# Patient Record
Sex: Female | Born: 1950 | Race: Black or African American | Hispanic: No | Smoking: Former smoker
Health system: Southern US, Community
[De-identification: ages and names within clinical notes are randomized; demographics above are authoritative.]

## PROBLEM LIST (undated history)

## (undated) DIAGNOSIS — R002 Palpitations: Secondary | ICD-10-CM

## (undated) DIAGNOSIS — E079 Disorder of thyroid, unspecified: Secondary | ICD-10-CM

## (undated) DIAGNOSIS — I10 Essential (primary) hypertension: Secondary | ICD-10-CM

## (undated) DIAGNOSIS — I5030 Unspecified diastolic (congestive) heart failure: Secondary | ICD-10-CM

## (undated) DIAGNOSIS — C801 Malignant (primary) neoplasm, unspecified: Secondary | ICD-10-CM

## (undated) DIAGNOSIS — E119 Type 2 diabetes mellitus without complications: Secondary | ICD-10-CM

## (undated) DIAGNOSIS — C73 Malignant neoplasm of thyroid gland: Secondary | ICD-10-CM

## (undated) DIAGNOSIS — H43811 Vitreous degeneration, right eye: Secondary | ICD-10-CM

## (undated) DIAGNOSIS — I2510 Atherosclerotic heart disease of native coronary artery without angina pectoris: Secondary | ICD-10-CM

## (undated) DIAGNOSIS — I700 Atherosclerosis of aorta: Secondary | ICD-10-CM

## (undated) DIAGNOSIS — M5136 Other intervertebral disc degeneration, lumbar region: Secondary | ICD-10-CM

## (undated) DIAGNOSIS — D649 Anemia, unspecified: Secondary | ICD-10-CM

## (undated) DIAGNOSIS — I219 Acute myocardial infarction, unspecified: Secondary | ICD-10-CM

## (undated) DIAGNOSIS — K219 Gastro-esophageal reflux disease without esophagitis: Secondary | ICD-10-CM

## (undated) DIAGNOSIS — H02833 Dermatochalasis of right eye, unspecified eyelid: Secondary | ICD-10-CM

## (undated) DIAGNOSIS — H25813 Combined forms of age-related cataract, bilateral: Secondary | ICD-10-CM

## (undated) DIAGNOSIS — H02836 Dermatochalasis of left eye, unspecified eyelid: Secondary | ICD-10-CM

## (undated) DIAGNOSIS — I517 Cardiomegaly: Secondary | ICD-10-CM

## (undated) DIAGNOSIS — I6381 Other cerebral infarction due to occlusion or stenosis of small artery: Secondary | ICD-10-CM

## (undated) DIAGNOSIS — N2889 Other specified disorders of kidney and ureter: Secondary | ICD-10-CM

## (undated) HISTORY — DX: Other cerebral infarction due to occlusion or stenosis of small artery: I63.81

## (undated) HISTORY — DX: Type 2 diabetes mellitus without complications: E11.9

## (undated) HISTORY — DX: Gastro-esophageal reflux disease without esophagitis: K21.9

## (undated) HISTORY — DX: Dermatochalasis of right eye, unspecified eyelid: H02.833

## (undated) HISTORY — DX: Disorder of thyroid, unspecified: E07.9

## (undated) HISTORY — DX: Combined forms of age-related cataract, bilateral: H25.813

## (undated) HISTORY — DX: Palpitations: R00.2

## (undated) HISTORY — DX: Atherosclerotic heart disease of native coronary artery without angina pectoris: I25.10

## (undated) HISTORY — DX: Acute myocardial infarction, unspecified: I21.9

## (undated) HISTORY — DX: Vitreous degeneration, right eye: H43.811

## (undated) HISTORY — DX: Essential (primary) hypertension: I10

## (undated) HISTORY — DX: Unspecified diastolic (congestive) heart failure: I50.30

## (undated) HISTORY — DX: Dermatochalasis of left eye, unspecified eyelid: H02.836

## (undated) HISTORY — DX: Atherosclerosis of aorta: I70.0

## (undated) HISTORY — DX: Other specified disorders of kidney and ureter: N28.89

## (undated) HISTORY — DX: Malignant (primary) neoplasm, unspecified: C80.1

## (undated) HISTORY — DX: Malignant neoplasm of thyroid gland: C73

## (undated) HISTORY — DX: Other intervertebral disc degeneration, lumbar region: M51.36

## (undated) HISTORY — DX: Cardiomegaly: I51.7

---
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
## 2003-07-27 DIAGNOSIS — I219 Acute myocardial infarction, unspecified: Secondary | ICD-10-CM

## 2003-07-27 HISTORY — DX: Acute myocardial infarction, unspecified: I21.9

## 2011-05-24 DIAGNOSIS — K2100 Gastro-esophageal reflux disease with esophagitis, without bleeding: Secondary | ICD-10-CM | POA: Insufficient documentation

## 2011-05-24 DIAGNOSIS — I2510 Atherosclerotic heart disease of native coronary artery without angina pectoris: Secondary | ICD-10-CM | POA: Insufficient documentation

## 2011-05-24 DIAGNOSIS — I25119 Atherosclerotic heart disease of native coronary artery with unspecified angina pectoris: Secondary | ICD-10-CM | POA: Insufficient documentation

## 2011-05-24 DIAGNOSIS — E785 Hyperlipidemia, unspecified: Secondary | ICD-10-CM | POA: Insufficient documentation

## 2011-05-24 DIAGNOSIS — I252 Old myocardial infarction: Secondary | ICD-10-CM | POA: Insufficient documentation

## 2011-05-24 DIAGNOSIS — M1712 Unilateral primary osteoarthritis, left knee: Secondary | ICD-10-CM | POA: Insufficient documentation

## 2011-08-30 DIAGNOSIS — R928 Other abnormal and inconclusive findings on diagnostic imaging of breast: Secondary | ICD-10-CM | POA: Insufficient documentation

## 2011-09-29 DIAGNOSIS — L858 Other specified epidermal thickening: Secondary | ICD-10-CM | POA: Insufficient documentation

## 2011-11-29 DIAGNOSIS — R002 Palpitations: Secondary | ICD-10-CM | POA: Insufficient documentation

## 2012-06-12 DIAGNOSIS — E6609 Other obesity due to excess calories: Secondary | ICD-10-CM | POA: Insufficient documentation

## 2012-06-12 DIAGNOSIS — Z6830 Body mass index (BMI) 30.0-30.9, adult: Secondary | ICD-10-CM

## 2014-01-21 DIAGNOSIS — R911 Solitary pulmonary nodule: Secondary | ICD-10-CM | POA: Insufficient documentation

## 2014-01-29 DIAGNOSIS — E041 Nontoxic single thyroid nodule: Secondary | ICD-10-CM | POA: Insufficient documentation

## 2014-06-24 DIAGNOSIS — N952 Postmenopausal atrophic vaginitis: Secondary | ICD-10-CM | POA: Insufficient documentation

## 2014-07-26 HISTORY — PX: TOTAL THYROIDECTOMY: SHX2547

## 2014-08-09 DIAGNOSIS — C73 Malignant neoplasm of thyroid gland: Secondary | ICD-10-CM | POA: Insufficient documentation

## 2014-08-17 DIAGNOSIS — C73 Malignant neoplasm of thyroid gland: Secondary | ICD-10-CM | POA: Insufficient documentation

## 2014-09-16 DIAGNOSIS — R21 Rash and other nonspecific skin eruption: Secondary | ICD-10-CM | POA: Insufficient documentation

## 2014-09-16 DIAGNOSIS — I8393 Asymptomatic varicose veins of bilateral lower extremities: Secondary | ICD-10-CM | POA: Insufficient documentation

## 2014-12-30 DIAGNOSIS — Z0000 Encounter for general adult medical examination without abnormal findings: Secondary | ICD-10-CM | POA: Insufficient documentation

## 2015-04-21 DIAGNOSIS — R1031 Right lower quadrant pain: Secondary | ICD-10-CM | POA: Insufficient documentation

## 2015-10-27 DIAGNOSIS — S39012A Strain of muscle, fascia and tendon of lower back, initial encounter: Secondary | ICD-10-CM | POA: Insufficient documentation

## 2015-10-27 DIAGNOSIS — E1142 Type 2 diabetes mellitus with diabetic polyneuropathy: Secondary | ICD-10-CM | POA: Insufficient documentation

## 2015-12-30 DIAGNOSIS — E890 Postprocedural hypothyroidism: Secondary | ICD-10-CM | POA: Insufficient documentation

## 2015-12-30 DIAGNOSIS — N644 Mastodynia: Secondary | ICD-10-CM | POA: Insufficient documentation

## 2015-12-30 DIAGNOSIS — L509 Urticaria, unspecified: Secondary | ICD-10-CM | POA: Insufficient documentation

## 2016-01-28 DIAGNOSIS — I5189 Other ill-defined heart diseases: Secondary | ICD-10-CM | POA: Insufficient documentation

## 2016-02-10 DIAGNOSIS — E785 Hyperlipidemia, unspecified: Secondary | ICD-10-CM

## 2016-02-10 DIAGNOSIS — K219 Gastro-esophageal reflux disease without esophagitis: Secondary | ICD-10-CM | POA: Insufficient documentation

## 2016-02-10 DIAGNOSIS — E1169 Type 2 diabetes mellitus with other specified complication: Secondary | ICD-10-CM | POA: Insufficient documentation

## 2016-02-16 DIAGNOSIS — I10 Essential (primary) hypertension: Secondary | ICD-10-CM | POA: Insufficient documentation

## 2016-07-09 DIAGNOSIS — N281 Cyst of kidney, acquired: Secondary | ICD-10-CM | POA: Insufficient documentation

## 2016-09-17 ENCOUNTER — Ambulatory Visit (INDEPENDENT_AMBULATORY_CARE_PROVIDER_SITE_OTHER): Payer: Medicare Other | Admitting: Physician Assistant

## 2016-09-17 ENCOUNTER — Encounter: Payer: Self-pay | Admitting: Physician Assistant

## 2016-09-17 VITALS — BP 119/72 | HR 75 | Ht 63.0 in | Wt 182.0 lb

## 2016-09-17 DIAGNOSIS — E1142 Type 2 diabetes mellitus with diabetic polyneuropathy: Secondary | ICD-10-CM | POA: Diagnosis not present

## 2016-09-17 MED ORDER — GABAPENTIN 300 MG PO CAPS: 300.0000 mg | ORAL_CAPSULE | Freq: Two times a day (BID) | ORAL | 5 refills | Status: DC

## 2016-09-17 NOTE — Patient Instructions (Addendum)
Increase Gabapentin to 1 tablet twice a day. If you experience drowsiness, you can take both tablets at bedtime.   Follow-up in 3-6 months

## 2016-09-17 NOTE — Progress Notes (Signed)
HPI:                                                                Sara Gardner is a 66 y.o. female who presents to [place]: [facility] today to establish care  Current Concerns include diabetic neuropathy  Patient is transferring her care from [facility]. She is up to date on her diabetic care and labs. Her last A1C was 6.7 (07/09/16)  Patient c/o "burning pain in bilateral lower extremities and hands." She also reports paresthesias and itching in her bilateral hands. She denies recent falls. She denies open sores or wounds on her feet. She is currently taking Gabapentin 300mg  nightly.  Patient states this is impacting her quality of life. She was able to get her Bachelor's degree in business administration, but she is unable to work. She is requesting that I complete a form for her student loan servicer that documents her inability to work full-time.  Health Maintenance Health Maintenance  Topic Date Due  . OPHTHALMOLOGY EXAM  06/17/1960  . COLONOSCOPY  06/17/2000  . PNA vac Low Risk Adult (2 of 2 - PPSV23) 06/25/2015  . HEMOGLOBIN A1C  01/07/2017  . FOOT EXAM  09/17/2017  . MAMMOGRAM  08/06/2018  . TETANUS/TDAP  05/18/2025  . INFLUENZA VACCINE  Completed  . DEXA SCAN  Completed  . Hepatitis C Screening  Completed    Past Medical History:  Diagnosis Date  . Cancer ([place])   . Diabetes mellitus without complication ([place])   . Diastolic heart failure, NYHA class 1 ([place])   . Hypertension   . Myocardial infarction 2005  . Thyroid disease    No past surgical history on file. Social History  Substance Use Topics  . Smoking status: Not on file  . Smokeless tobacco: Not on file  . Alcohol use Not on file   family history is not on file.  ROS: negative except as noted in the HPI  Medications: Current Outpatient Prescriptions  Medication Sig Dispense Refill  . EPINEPHrine 0.3 mg/0.3 mL IJ SOAJ injection Inject 1 each as directed as  needed.    Marland Kitchen lisinopril-hydrochlorothiazide (PRINZIDE,ZESTORETIC) 10-12.5 MG tablet Take 10-12.5 tablets by mouth daily.    Marland Kitchen ammonium lactate (LAC-HYDRIN) 12 % lotion APP TOPICALLY PRF DRY SKIN  0  . aspirin 81 MG chewable tablet Chew by mouth.    . esomeprazole (NEXIUM) 40 MG capsule Take 40 mg by mouth.    . gabapentin (NEURONTIN) 300 MG capsule Take 1 capsule (300 mg total) by mouth 2 (two) times daily. 60 capsule 5  . levothyroxine (SYNTHROID, LEVOTHROID) 100 MCG tablet Take 100 mcg by mouth daily.  1  . metFORMIN (GLUCOPHAGE) 1000 MG tablet Take 1,000 mg by mouth daily.  2  . metoprolol succinate (TOPROL-XL) 50 MG 24 hr tablet Take 50 mg by mouth daily.  5   No current facility-administered medications for this visit.    Allergies  Allergen Reactions  . Bee Venom Swelling  . Tape Rash       Objective:  BP 119/72   Pulse 75   Ht 5\' 3"  (1.6 m)   Wt 182 lb (82.6 kg)   BMI 32.24 kg/m  Gen: well-groomed, cooperative, not ill-appearing, no distress Pulm:  Normal work of breathing, clear to auscultation bilaterally CV: Normal rate, regular rhythm, s1 and s2 distinct, no murmurs, clicks or rubs appreciated on this exam, no carotid bruit, no peripheral edema Neuro: alert and oriented x 3, EOM's intact, PERRLA, DTR's intact MSK: strength 5/5 and symmetric in bilateral upper and lower extreimties, normal gait and station Skin: warm and dry, no rashes or lesions on exposed skin Psych: normal affect, pleasant mood, normal speech and thought content  Diabetic Foot Exam - Simple   Simple Foot Form Diabetic Foot exam was performed with the following findings:  Yes   Visual Inspection No deformities, no ulcerations, no other skin breakdown bilaterally:  Yes Sensation Testing Intact to touch and monofilament testing bilaterally:  Yes See comments:  Yes Pulse Check Posterior Tibialis and Dorsalis pulse intact bilaterally:  Yes Comments Sensation intact to monofilament except for  right heel     I personally reviewed outside records and labs from 07/09/16, including Hemoglobin A1C, TSH, free T4, Hep C, lipid profile, urine Creat and urine microalbumin which were wnl  Assessment and Plan: 66 y.o. female with   1. Diabetic sensorimotor polyneuropathy ([place]) - diabetes well-controlled, A1C <7.0 - distal pulses, gross sensation, and monofilament testing intact. Right heel deficient to monofilament only. Increasing gabapentin to bid - completed student loan documentation stating patient has a condition which prevents her from working full-time - gabapentin (NEURONTIN) 300 MG capsule; Take 1 capsule (300 mg total) by mouth 2 (two) times daily.  Dispense: 60 capsule; Refill: 5  Patient education and anticipatory guidance given Patient agrees with treatment plan Follow-up in 3 months or sooner as needed  Darlyne Russian PA-C

## 2016-09-21 ENCOUNTER — Encounter: Payer: Self-pay | Admitting: Physician Assistant

## 2016-10-04 ENCOUNTER — Other Ambulatory Visit: Payer: Self-pay

## 2016-10-04 ENCOUNTER — Other Ambulatory Visit: Payer: Self-pay | Admitting: Physician Assistant

## 2016-10-04 DIAGNOSIS — E1169 Type 2 diabetes mellitus with other specified complication: Secondary | ICD-10-CM

## 2016-10-04 DIAGNOSIS — E785 Hyperlipidemia, unspecified: Principal | ICD-10-CM

## 2016-10-04 MED ORDER — GLUCOSE BLOOD VI STRP: ORAL_STRIP | 12 refills | Status: DC

## 2016-10-04 MED ORDER — ONETOUCH ULTRA SYSTEM W/DEVICE KIT: PACK | 0 refills | Status: DC

## 2016-10-04 MED ORDER — ONETOUCH ULTRASOFT LANCETS MISC: 99 refills | Status: DC

## 2016-10-06 DIAGNOSIS — M79601 Pain in right arm: Secondary | ICD-10-CM | POA: Diagnosis not present

## 2016-10-06 DIAGNOSIS — Z79899 Other long term (current) drug therapy: Secondary | ICD-10-CM | POA: Diagnosis not present

## 2016-10-06 DIAGNOSIS — I110 Hypertensive heart disease with heart failure: Secondary | ICD-10-CM | POA: Diagnosis not present

## 2016-10-06 DIAGNOSIS — K219 Gastro-esophageal reflux disease without esophagitis: Secondary | ICD-10-CM | POA: Diagnosis not present

## 2016-10-06 DIAGNOSIS — I509 Heart failure, unspecified: Secondary | ICD-10-CM | POA: Diagnosis not present

## 2016-10-06 DIAGNOSIS — R0789 Other chest pain: Secondary | ICD-10-CM | POA: Diagnosis not present

## 2016-10-06 DIAGNOSIS — Z91048 Other nonmedicinal substance allergy status: Secondary | ICD-10-CM | POA: Diagnosis not present

## 2016-10-06 DIAGNOSIS — Z85850 Personal history of malignant neoplasm of thyroid: Secondary | ICD-10-CM | POA: Diagnosis not present

## 2016-10-06 DIAGNOSIS — Z791 Long term (current) use of non-steroidal anti-inflammatories (NSAID): Secondary | ICD-10-CM | POA: Diagnosis not present

## 2016-10-06 DIAGNOSIS — E890 Postprocedural hypothyroidism: Secondary | ICD-10-CM | POA: Diagnosis not present

## 2016-10-06 DIAGNOSIS — I2510 Atherosclerotic heart disease of native coronary artery without angina pectoris: Secondary | ICD-10-CM | POA: Diagnosis not present

## 2016-10-06 DIAGNOSIS — I252 Old myocardial infarction: Secondary | ICD-10-CM | POA: Diagnosis not present

## 2016-10-06 DIAGNOSIS — R918 Other nonspecific abnormal finding of lung field: Secondary | ICD-10-CM | POA: Diagnosis not present

## 2016-10-06 DIAGNOSIS — Z87891 Personal history of nicotine dependence: Secondary | ICD-10-CM | POA: Diagnosis not present

## 2016-10-06 DIAGNOSIS — Z91030 Bee allergy status: Secondary | ICD-10-CM | POA: Diagnosis not present

## 2016-10-06 DIAGNOSIS — E1140 Type 2 diabetes mellitus with diabetic neuropathy, unspecified: Secondary | ICD-10-CM | POA: Diagnosis not present

## 2016-10-06 DIAGNOSIS — Z7982 Long term (current) use of aspirin: Secondary | ICD-10-CM | POA: Diagnosis not present

## 2016-10-06 DIAGNOSIS — M19012 Primary osteoarthritis, left shoulder: Secondary | ICD-10-CM | POA: Diagnosis not present

## 2016-10-06 DIAGNOSIS — E785 Hyperlipidemia, unspecified: Secondary | ICD-10-CM | POA: Diagnosis not present

## 2016-10-06 DIAGNOSIS — E11618 Type 2 diabetes mellitus with other diabetic arthropathy: Secondary | ICD-10-CM | POA: Diagnosis not present

## 2016-10-06 DIAGNOSIS — Z7984 Long term (current) use of oral hypoglycemic drugs: Secondary | ICD-10-CM | POA: Diagnosis not present

## 2016-10-12 DIAGNOSIS — E890 Postprocedural hypothyroidism: Secondary | ICD-10-CM | POA: Diagnosis not present

## 2016-10-12 DIAGNOSIS — I10 Essential (primary) hypertension: Secondary | ICD-10-CM | POA: Diagnosis not present

## 2016-10-12 DIAGNOSIS — I2510 Atherosclerotic heart disease of native coronary artery without angina pectoris: Secondary | ICD-10-CM | POA: Diagnosis not present

## 2016-10-12 DIAGNOSIS — Z79899 Other long term (current) drug therapy: Secondary | ICD-10-CM | POA: Diagnosis not present

## 2016-10-12 DIAGNOSIS — I252 Old myocardial infarction: Secondary | ICD-10-CM | POA: Diagnosis not present

## 2016-10-12 DIAGNOSIS — R0789 Other chest pain: Secondary | ICD-10-CM | POA: Diagnosis not present

## 2016-10-12 DIAGNOSIS — C73 Malignant neoplasm of thyroid gland: Secondary | ICD-10-CM | POA: Diagnosis not present

## 2016-10-28 DIAGNOSIS — B351 Tinea unguium: Secondary | ICD-10-CM | POA: Diagnosis not present

## 2016-10-28 DIAGNOSIS — L602 Onychogryphosis: Secondary | ICD-10-CM | POA: Diagnosis not present

## 2016-10-28 DIAGNOSIS — I2510 Atherosclerotic heart disease of native coronary artery without angina pectoris: Secondary | ICD-10-CM | POA: Diagnosis not present

## 2016-10-28 DIAGNOSIS — E1142 Type 2 diabetes mellitus with diabetic polyneuropathy: Secondary | ICD-10-CM | POA: Diagnosis not present

## 2016-10-28 DIAGNOSIS — I10 Essential (primary) hypertension: Secondary | ICD-10-CM | POA: Diagnosis not present

## 2016-10-29 ENCOUNTER — Other Ambulatory Visit: Payer: Self-pay | Admitting: Physician Assistant

## 2016-11-01 ENCOUNTER — Other Ambulatory Visit: Payer: Self-pay | Admitting: Physician Assistant

## 2016-11-01 DIAGNOSIS — E1169 Type 2 diabetes mellitus with other specified complication: Secondary | ICD-10-CM

## 2016-11-01 DIAGNOSIS — E785 Hyperlipidemia, unspecified: Principal | ICD-10-CM

## 2016-11-15 ENCOUNTER — Other Ambulatory Visit: Payer: Self-pay

## 2016-11-15 DIAGNOSIS — E785 Hyperlipidemia, unspecified: Principal | ICD-10-CM

## 2016-11-15 DIAGNOSIS — E1169 Type 2 diabetes mellitus with other specified complication: Secondary | ICD-10-CM

## 2016-11-15 MED ORDER — ONETOUCH DELICA LANCING DEV MISC: 99 refills | Status: DC

## 2016-12-15 ENCOUNTER — Ambulatory Visit (INDEPENDENT_AMBULATORY_CARE_PROVIDER_SITE_OTHER): Payer: Medicare Other | Admitting: Physician Assistant

## 2016-12-15 ENCOUNTER — Ambulatory Visit: Payer: Medicare Other | Admitting: Physician Assistant

## 2016-12-15 ENCOUNTER — Encounter: Payer: Self-pay | Admitting: Physician Assistant

## 2016-12-15 VITALS — BP 102/68 | HR 89 | Wt 182.0 lb

## 2016-12-15 DIAGNOSIS — R600 Localized edema: Secondary | ICD-10-CM | POA: Insufficient documentation

## 2016-12-15 DIAGNOSIS — M170 Bilateral primary osteoarthritis of knee: Secondary | ICD-10-CM | POA: Diagnosis not present

## 2016-12-15 DIAGNOSIS — Z91030 Bee allergy status: Secondary | ICD-10-CM | POA: Insufficient documentation

## 2016-12-15 DIAGNOSIS — K219 Gastro-esophageal reflux disease without esophagitis: Secondary | ICD-10-CM | POA: Diagnosis not present

## 2016-12-15 DIAGNOSIS — Z0279 Encounter for issue of other medical certificate: Secondary | ICD-10-CM | POA: Diagnosis not present

## 2016-12-15 DIAGNOSIS — E1142 Type 2 diabetes mellitus with diabetic polyneuropathy: Secondary | ICD-10-CM | POA: Diagnosis not present

## 2016-12-15 MED ORDER — AMBULATORY NON FORMULARY MEDICATION: 0 refills | Status: AC

## 2016-12-15 MED ORDER — ESOMEPRAZOLE MAGNESIUM 20 MG PO CPDR: 20.0000 mg | DELAYED_RELEASE_CAPSULE | Freq: Every day | ORAL | 1 refills | Status: DC

## 2016-12-15 MED ORDER — EPINEPHRINE 0.3 MG/0.3ML IJ SOAJ: INTRAMUSCULAR | 1 refills | Status: DC

## 2016-12-15 NOTE — Patient Instructions (Addendum)
-   Go downstairs for labs - Wear compression stockings and elevate legs when possible - Follow-up in 3 months for diabetes or sooner as needed   Peripheral Edema Peripheral edema is swelling that is caused by a buildup of fluid. Peripheral edema most often affects the lower legs, ankles, and feet. It can also develop in the arms, hands, and face. The area of the body that has peripheral edema will look swollen. It may also feel heavy or warm. Your clothes may start to feel tight. Pressing on the area may make a temporary dent in your skin. You may not be able to move your arm or leg as much as usual. There are many causes of peripheral edema. It can be a complication of other diseases, such as congestive heart failure, kidney disease, or a problem with your blood circulation. It also can be a side effect of certain medicines. It often happens to women during pregnancy. Sometimes, the cause is not known. Treating the underlying condition is often the only treatment for peripheral edema. Follow these instructions at home: Pay attention to any changes in your symptoms. Take these actions to help with your discomfort:  Raise (elevate) your legs while you are sitting or lying down.  Move around often to prevent stiffness and to lessen swelling. Do not sit or stand for long periods of time.  Wear support stockings as told by your health care provider.  Follow instructions from your health care provider about limiting salt (sodium) in your diet. Sometimes eating less salt can reduce swelling.  Take over-the-counter and prescription medicines only as told by your health care provider. Your health care provider may prescribe medicine to help your body get rid of excess water (diuretic).  Keep all follow-up visits as told by your health care provider. This is important. Contact a health care provider if:  You have a fever.  Your edema starts suddenly or is getting worse, especially if you are pregnant  or have a medical condition.  You have swelling in only one leg.  You have increased swelling and pain in your legs. Get help right away if:  You develop shortness of breath, especially when you are lying down.  You have pain in your chest or abdomen.  You feel weak.  You faint. This information is not intended to replace advice given to you by your health care provider. Make sure you discuss any questions you have with your health care provider. Document Released: 08/19/2004 Document Revised: 12/15/2015 Document Reviewed: 01/22/2015 Elsevier Interactive Patient Education  2017 [employer].

## 2016-12-15 NOTE — Progress Notes (Deleted)
   Subjective:    I'm seeing this patient as a consultation for:    CC:   HPI:  Past medical history:  Negative.  See flowsheet/record as well for more information.  Surgical history: Negative.  See flowsheet/record as well for more information.  Family history: Negative.  See flowsheet/record as well for more information.  Social history: Negative.  See flowsheet/record as well for more information.  Allergies, and medications have been entered into the medical record, reviewed, and no changes needed.   Review of Systems: No headache, visual changes, nausea, vomiting, diarrhea, constipation, dizziness, abdominal pain, skin rash, fevers, chills, night sweats, weight loss, swollen lymph nodes, body aches, joint swelling, muscle aches, chest pain, shortness of breath, mood changes, visual or auditory hallucinations.   Objective:   General: Well Developed, well nourished, and in no acute distress.  Neuro/Psych: Alert and oriented x3, extra-ocular muscles intact, able to move all 4 extremities, sensation grossly intact. Skin: Warm and dry, no rashes noted.  Respiratory: Not using accessory muscles, speaking in full sentences, trachea midline.  Cardiovascular: Pulses palpable, no extremity edema. Abdomen: Does not appear distended.   Impression and Recommendations:   This case required medical decision making of moderate complexity.  No problem-specific Assessment & Plan notes found for this encounter.

## 2016-12-15 NOTE — Progress Notes (Signed)
HPI:                                                                Sara Gardner is a 66 y.o. female who presents to [place]: [facility] today for bilateral lower extremity swelling  Patient with PMH HTN, DM II with peripheral neuropathy, hypothyroid, CAD, and HFpEF reports bilateral lower extremity edema x 1 week. She states her feet are so swollen her shoes do not fit. She states "I have never been like this." She does endorse bilateral knee arthritis and endorses knee pain today. She denies orthopnea, DOE, PND. She recently was evicted from her apartment and has been on her feet more than her baseline moving her belongings into storage. She has unfortunately been living and sleeping out of her car for the past week.  She is requesting a disability parking placard today.  GERD: requesting refill of Nexium daily. No history of PUD or GI bleed. Denies melena or hematochezia.   She is also requesting a refill of her Epi-pen for bee venom allergy  Past Medical History:  Diagnosis Date  . Cancer ([place])   . Diabetes mellitus without complication ([place])   . Diastolic heart failure, NYHA class 1 ([place])   . GERD (gastroesophageal reflux disease)   . Hypertension   . Myocardial infarction ([place]) 2005  . Papillary thyroid carcinoma ([place])   . Thyroid disease    No past surgical history on file. Social History  Substance Use Topics  . Smoking status: Never Smoker  . Smokeless tobacco: Never Used  . Alcohol use No   family history is not on file.  ROS: negative except as noted in the HPI  Medications: Current Outpatient Prescriptions  Medication Sig Dispense Refill  . ammonium lactate (LAC-HYDRIN) 12 % lotion APP TOPICALLY PRF DRY SKIN  0  . aspirin 81 MG chewable tablet Chew by mouth.    . Blood Glucose Monitoring Suppl (ONE TOUCH ULTRA MINI) w/Device KIT CHECK FASTING BLOOD SUGAR DAILY AND 2 HOURS AFTER LARGEST MEAL OF THE DAY 100 each 1  .  EPINEPHrine 0.3 mg/0.3 mL IJ SOAJ injection Inject 1 each as directed as needed. 1 Device 1  . esomeprazole (NEXIUM) 20 MG capsule Take 1 capsule (20 mg total) by mouth daily. 90 capsule 1  . Lancet Devices (ONE TOUCH DELICA LANCING DEV) MISC Check fasting blood sugar daily and 2 hours after largest meal of the day. Dx DM. 100 each prn  . Lancets (ONETOUCH ULTRASOFT) lancets Check fasting blood sugar daily and 2 hours after largest meal of the day. Dx DM. 100 each prn  . levothyroxine (SYNTHROID, LEVOTHROID) 100 MCG tablet Take 100 mcg by mouth daily.  1  . lisinopril-hydrochlorothiazide (PRINZIDE,ZESTORETIC) 10-12.5 MG tablet Take 10-12.5 tablets by mouth daily.    . metFORMIN (GLUCOPHAGE) 1000 MG tablet Take 1,000 mg by mouth daily.  2  . metoprolol succinate (TOPROL-XL) 50 MG 24 hr tablet Take 50 mg by mouth daily.  5  . ONE TOUCH ULTRA TEST test strip CHECK BLOOD SUGARS EVERY DAY AND 2 HOURS AFTER THE LARGEST MEAL OF THE DAY 100 each 2  . AMBULATORY NON FORMULARY MEDICATION Knee-high, medium compression, graduated compression stockings. Apply to lower extremities. [email or web address], Zippered Compression  Stockings, medium circ, long length 1 each 0   No current facility-administered medications for this visit.    Allergies  Allergen Reactions  . Bee Venom Swelling  . Tape Rash       Objective:  BP 102/68   Pulse 89   Wt 182 lb (82.6 kg)   BMI 32.24 kg/m  Gen: well-groomed, cooperative, not ill-appearing, no distress Pulm: Normal work of breathing, normal phonation, clear to auscultation bilaterally, no wheezes, rales or rhonchi CV: Normal rate, regular rhythm, s1 and s2 distinct, no murmurs, clicks or rubs  Neuro: alert and oriented x 3, EOM's intact, no tremor MSK: bilateral, symmetric nonpitting pedal edema, calves nontender, brisk capillary refill, knees visibly swollen right worse than left, no effusion, ROM intact; normal gait and station Skin: warm, dry, intact; no  rashes or lesions on exposed skin, no foot ulcers   No results found for this or any previous visit (from the past 72 hour(s)). No results found.    Assessment and Plan: 66 y.o. female with   1. Bilateral lower extremity edema - suspect this is most likely dependent edema given history and that patient is unable to lie recumbent at night while sleeping in a car - checking BNP, BMP, TSH - compression stockings - elevate lower extremities   2. Bilateral primary osteoarthritis of knee - tylenol 670m prn  3. Allergy to honey bee venom - EPINEPHrine 0.3 mg/0.3 mL IJ SOAJ injection;   Dispense: 1 Device  4. Gastroesophageal reflux disease without esophagitis - esomeprazole (NEXIUM) 40 MG capsule; Take 1 capsule (40 mg total) by mouth.  5. Medical certificate issuance - disability parking placard application completed and signed  6. Controlled Type II DM - HgbA1C - cont daily meds  Patient education and anticipatory guidance given Patient agrees with treatment plan Follow-up in 3 months for DM or sooner as needed if symptoms worsen or fail to improve  CDarlyne RussianPA-C

## 2016-12-16 LAB — TSH: TSH: 2.03 mIU/L

## 2016-12-16 LAB — BASIC METABOLIC PANEL
BUN: 11 mg/dL (ref 7–25)
CALCIUM: 9.4 mg/dL (ref 8.6–10.4)
CHLORIDE: 105 mmol/L (ref 98–110)
CO2: 29 mmol/L (ref 20–31)
CREATININE: 0.83 mg/dL (ref 0.50–0.99)
Glucose, Bld: 109 mg/dL — ABNORMAL HIGH (ref 65–99)
Potassium: 4.5 mmol/L (ref 3.5–5.3)
Sodium: 144 mmol/L (ref 135–146)

## 2016-12-16 LAB — HEMOGLOBIN A1C
Hgb A1c MFr Bld: 7.2 % — ABNORMAL HIGH (ref <5.7)
Mean Plasma Glucose: 160 mg/dL

## 2016-12-16 LAB — BRAIN NATRIURETIC PEPTIDE: BRAIN NATRIURETIC PEPTIDE: 14.9 pg/mL (ref <100)

## 2016-12-27 ENCOUNTER — Encounter: Payer: Self-pay | Admitting: Family Medicine

## 2016-12-27 ENCOUNTER — Ambulatory Visit (INDEPENDENT_AMBULATORY_CARE_PROVIDER_SITE_OTHER): Payer: Medicare Other | Admitting: Family Medicine

## 2016-12-27 VITALS — BP 120/74 | HR 80 | Wt 182.0 lb

## 2016-12-27 DIAGNOSIS — E1142 Type 2 diabetes mellitus with diabetic polyneuropathy: Secondary | ICD-10-CM

## 2016-12-27 NOTE — Patient Instructions (Signed)
Thank you for coming in today. Return as needed to see me.  Follow up with Evlyn Clines

## 2016-12-28 NOTE — Progress Notes (Signed)
Sara Gardner is a 66 y.o. female who presents to [place]: [facility] today for evaluation for neuropathy pain and disability. Patient has a long history of disabling polyneuropathy. This produces pierced using pain. She's been unable to work for years due to this condition. She has paperwork today for disability that she would like filled out.   Past Medical History:  Diagnosis Date  . Cancer ([place])   . Diabetes mellitus without complication ([place])   . Diastolic heart failure, NYHA class 1 ([place])   . GERD (gastroesophageal reflux disease)   . Hypertension   . Myocardial infarction ([place]) 2005  . Papillary thyroid carcinoma ([place])   . Thyroid disease    No past surgical history on file. Social History  Substance Use Topics  . Smoking status: Never Smoker  . Smokeless tobacco: Never Used  . Alcohol use No   family history is not on file.  ROS as above:  Medications: Current Outpatient Prescriptions  Medication Sig Dispense Refill  . AMBULATORY NON FORMULARY MEDICATION Knee-high, medium compression, graduated compression stockings. Apply to lower extremities. [email or web address], Zippered Compression Stockings, medium circ, long length 1 each 0  . ammonium lactate (LAC-HYDRIN) 12 % lotion APP TOPICALLY PRF DRY SKIN  0  . aspirin 81 MG chewable tablet Chew by mouth.    . Blood Glucose Monitoring Suppl (ONE TOUCH ULTRA MINI) w/Device KIT CHECK FASTING BLOOD SUGAR DAILY AND 2 HOURS AFTER LARGEST MEAL OF THE DAY 100 each 1  . EPINEPHrine 0.3 mg/0.3 mL IJ SOAJ injection Inject 1 each as directed as needed. 1 Device 1  . esomeprazole (NEXIUM) 20 MG capsule Take 1 capsule (20 mg total) by mouth daily. 90 capsule 1  . Lancet Devices (ONE TOUCH DELICA LANCING DEV) MISC Check fasting blood sugar daily and 2 hours after largest meal of the day. Dx DM. 100 each prn  . Lancets  (ONETOUCH ULTRASOFT) lancets Check fasting blood sugar daily and 2 hours after largest meal of the day. Dx DM. 100 each prn  . levothyroxine (SYNTHROID, LEVOTHROID) 100 MCG tablet Take 100 mcg by mouth daily.  1  . lisinopril-hydrochlorothiazide (PRINZIDE,ZESTORETIC) 10-12.5 MG tablet Take 10-12.5 tablets by mouth daily.    . metFORMIN (GLUCOPHAGE) 1000 MG tablet Take 1,000 mg by mouth daily.  2  . metoprolol succinate (TOPROL-XL) 50 MG 24 hr tablet Take 50 mg by mouth daily.  5  . ONE TOUCH ULTRA TEST test strip CHECK BLOOD SUGARS EVERY DAY AND 2 HOURS AFTER THE LARGEST MEAL OF THE DAY 100 each 2   No current facility-administered medications for this visit.    Allergies  Allergen Reactions  . Bee Venom Swelling  . Tape Rash    Health Maintenance Health Maintenance  Topic Date Due  . PNA vac Low Risk Adult (2 of 2 - PPSV23) 06/25/2015  . OPHTHALMOLOGY EXAM  03/17/2017 (Originally 06/17/1961)  . COLONOSCOPY  12/15/2017 (Originally 06/17/2000)  . INFLUENZA VACCINE  02/23/2017  . HEMOGLOBIN A1C  06/17/2017  . FOOT EXAM  09/17/2017  . MAMMOGRAM  08/06/2018  . TETANUS/TDAP  05/18/2025  . DEXA SCAN  Completed  . Hepatitis C Screening  Completed     Exam:  BP 120/74   Pulse 80   Wt 182 lb (82.6 kg)   BMI 32.24 kg/m  Gen: Well NAD HEENT: EOMI,  MMM Lungs: Normal work of breathing. CTABL Heart: RRR no MRG Abd: NABS, Soft. Nondistended, Nontender Exts:  Brisk capillary refill, warm and well perfused.  Skin: Decreased sensation bilateral lower extremities.   No results found for this or any previous visit (from the past 72 hour(s)). No results found.    Assessment and Plan: 66 y.o. female with peripheral neuropathy maximal medical improvement at this time with disabling. Plan for conservative management. Paperwork filled out. Recheck as needed with me. Follow-up with PCP as needed.   No orders of the defined types were placed in this encounter.  No orders of the defined  types were placed in this encounter.    Discussed warning signs or symptoms. Please see discharge instructions. Patient expresses understanding.

## 2016-12-29 ENCOUNTER — Other Ambulatory Visit: Payer: Self-pay | Admitting: Physician Assistant

## 2017-01-10 ENCOUNTER — Ambulatory Visit (INDEPENDENT_AMBULATORY_CARE_PROVIDER_SITE_OTHER): Payer: Medicare Other | Admitting: Physician Assistant

## 2017-01-10 VITALS — BP 122/79 | HR 75 | Temp 98.0°F | Wt 178.0 lb

## 2017-01-10 DIAGNOSIS — Z23 Encounter for immunization: Secondary | ICD-10-CM | POA: Diagnosis not present

## 2017-01-10 DIAGNOSIS — L239 Allergic contact dermatitis, unspecified cause: Secondary | ICD-10-CM

## 2017-01-10 MED ORDER — TRIAMCINOLONE ACETONIDE 0.1 % EX CREA: 1.0000 "application " | TOPICAL_CREAM | Freq: Two times a day (BID) | CUTANEOUS | 0 refills | Status: DC

## 2017-01-10 NOTE — Patient Instructions (Signed)
- Apply cream to affected area only, twice a day, for no more than 2 weeks.  - AVOID contact with face and eyes - Benadryl 1 tab nightly as needed for itching   Contact Dermatitis Dermatitis is redness, soreness, and swelling (inflammation) of the skin. Contact dermatitis is a reaction to certain substances that touch the skin. There are two types of contact dermatitis:  Irritant contact dermatitis. This type is caused by something that irritates your skin, such as dry hands from washing them too much. This type does not require previous exposure to the substance for a reaction to occur. This type is more common.  Allergic contact dermatitis. This type is caused by a substance that you are allergic to, such as a nickel allergy or poison ivy. This type only occurs if you have been exposed to the substance (allergen) before. Upon a repeat exposure, your body reacts to the substance. This type is less common.  What are the causes? Many different substances can cause contact dermatitis. Irritant contact dermatitis is most commonly caused by exposure to:  Makeup.  Soaps.  Detergents.  Bleaches.  Acids.  Metal salts, such as nickel.  Allergic contact dermatitis is most commonly caused by exposure to:  Poisonous plants.  Chemicals.  Jewelry.  Latex.  Medicines.  Preservatives in products, such as clothing.  What increases the risk? This condition is more likely to develop in:  People who have jobs that expose them to irritants or allergens.  People who have certain medical conditions, such as asthma or eczema.  What are the signs or symptoms? Symptoms of this condition may occur anywhere on your body where the irritant has touched you or is touched by you. Symptoms include:  Dryness or flaking.  Redness.  Cracks.  Itching.  Pain or a burning feeling.  Blisters.  Drainage of small amounts of blood or clear fluid from skin cracks.  With allergic contact  dermatitis, there may also be swelling in areas such as the eyelids, mouth, or genitals. How is this diagnosed? This condition is diagnosed with a medical history and physical exam. A patch skin test may be performed to help determine the cause. If the condition is related to your job, you may need to see an occupational medicine specialist. How is this treated? Treatment for this condition includes figuring out what caused the reaction and protecting your skin from further contact. Treatment may also include:  Steroid creams or ointments. Oral steroid medicines may be needed in more severe cases.  Antibiotics or antibacterial ointments, if a skin infection is present.  Antihistamine lotion or an antihistamine taken by mouth to ease itching.  A bandage (dressing).  Follow these instructions at home: [place] your skin as needed.  Apply cool compresses to the affected areas.  Try taking a bath with: ? Epsom salts. Follow the instructions on the packaging. You can get these at your local pharmacy or grocery store. ? Baking soda. Pour a small amount into the bath as directed by your health care provider. ? Colloidal oatmeal. Follow the instructions on the packaging. You can get this at your local pharmacy or grocery store.  Try applying baking soda paste to your skin. Stir water into baking soda until it reaches a paste-like consistency.  Do not scratch your skin.  Bathe less frequently, such as every other day.  Bathe in lukewarm water. Avoid using hot water. Medicines  Take or apply over-the-counter and prescription medicines only as told  by your health care provider.  If you were prescribed an antibiotic medicine, take or apply your antibiotic as told by your health care provider. Do not stop using the antibiotic even if your condition starts to improve. General instructions  Keep all follow-up visits as told by your health care provider. This is  important.  Avoid the substance that caused your reaction. If you do not know what caused it, keep a journal to try to track what caused it. Write down: ? What you eat. ? What cosmetic products you use. ? What you drink. ? What you wear in the affected area. This includes jewelry.  If you were given a dressing, take care of it as told by your health care provider. This includes when to change and remove it. Contact a health care provider if:  Your condition does not improve with treatment.  Your condition gets worse.  You have signs of infection such as swelling, tenderness, redness, soreness, or warmth in the affected area.  You have a fever.  You have new symptoms. Get help right away if:  You have a severe headache, neck pain, or neck stiffness.  You vomit.  You feel very sleepy.  You notice red streaks coming from the affected area.  Your bone or joint underneath the affected area becomes painful after the skin has healed.  The affected area turns darker.  You have difficulty breathing. This information is not intended to replace advice given to you by your health care provider. Make sure you discuss any questions you have with your health care provider. Document Released: 07/09/2000 Document Revised: 12/18/2015 Document Reviewed: 11/27/2014 Elsevier Interactive Patient Education  2018 [employer].

## 2017-01-10 NOTE — Progress Notes (Signed)
HPI:                                                                Sara Gardner is a 66 y.o. female who presents to [facility]: [facility] today for arm rash  Onset: yesterday Location: started on left arm, has spread to left leg Duration: constant Character: pruritic, red nodules/papules Aggravating factors / Triggers: none Evolution: same Treatments tried: alcohol  Recent illness / systemic symptoms: none  Medication / drug exposure: none Recent travel: none Animal/insect exposure: none Sexual contacts: none History of allergies: yes, allergic to bee venom, history of urticaria Exposure to new soaps, perfumes, cleaning products: none Exposure to chemicals: none Patient reports she was visiting her father on Sunday and rested her left forearm on the arm rest of the sofa  Past Medical History:  Diagnosis Date  . Cancer ([place])   . Diabetes mellitus without complication ([place])   . Diastolic heart failure, NYHA class 1 ([place])   . GERD (gastroesophageal reflux disease)   . Hypertension   . Myocardial infarction ([place]) 2005  . Papillary thyroid carcinoma ([place])   . Thyroid disease    No past surgical history on file. Social History  Substance Use Topics  . Smoking status: Never Smoker  . Smokeless tobacco: Never Used  . Alcohol use No   family history is not on file.  ROS: denies facial/lip/tongue swelling, dysphagia, sore throat, wheezing, shortness of breath  Medications: Current Outpatient Prescriptions  Medication Sig Dispense Refill  . AMBULATORY NON FORMULARY MEDICATION Knee-high, medium compression, graduated compression stockings. Apply to lower extremities. [email or web address], Zippered Compression Stockings, medium circ, long length 1 each 0  . ammonium lactate (LAC-HYDRIN) 12 % lotion APP TOPICALLY PRF DRY SKIN  0  . aspirin 81 MG chewable tablet Chew by mouth.    . Blood Glucose Monitoring Suppl (ONE TOUCH ULTRA  MINI) w/Device KIT CHECK FASTING BLOOD SUGAR DAILY AND 2 HOURS AFTER LARGEST MEAL OF THE DAY 100 each 1  . EPINEPHrine 0.3 mg/0.3 mL IJ SOAJ injection Inject 1 each as directed as needed. 1 Device 1  . esomeprazole (NEXIUM) 20 MG capsule Take 1 capsule (20 mg total) by mouth daily. 90 capsule 1  . Lancet Devices (ONE TOUCH DELICA LANCING DEV) MISC Check fasting blood sugar daily and 2 hours after largest meal of the day. Dx DM. 100 each prn  . Lancets (ONETOUCH ULTRASOFT) lancets Check fasting blood sugar daily and 2 hours after largest meal of the day. Dx DM. 100 each prn  . levothyroxine (SYNTHROID, LEVOTHROID) 100 MCG tablet Take 100 mcg by mouth daily.  1  . lisinopril-hydrochlorothiazide (PRINZIDE,ZESTORETIC) 10-12.5 MG tablet Take 10-12.5 tablets by mouth daily.    . metFORMIN (GLUCOPHAGE) 1000 MG tablet TAKE 1 TABLET BY MOUTH EVERY MORNING 90 tablet 0  . metoprolol succinate (TOPROL-XL) 50 MG 24 hr tablet TAKE 1 TABLET BY MOUTH EVERY DAY FOR HIGH BLOOD PRESSURE 90 tablet 0  . ONE TOUCH ULTRA TEST test strip CHECK BLOOD SUGARS EVERY DAY AND 2 HOURS AFTER THE LARGEST MEAL OF THE DAY 100 each 2  . triamcinolone cream (KENALOG) 0.1 % Apply 1 application topically 2 (two) times daily. 30 g 0   No current facility-administered medications  for this visit.    Allergies  Allergen Reactions  . Bee Venom Swelling  . Tape Rash       Objective:  BP 122/79   Pulse 75   Temp 98 F (36.7 C) (Oral)   Wt 178 lb (80.7 kg)   BMI 31.53 kg/m  Gen: well-groomed, cooperative, not ill-appearing, no distress HEENT: no facial swelling, normal conjunctiva, oropharynx clear, uvula midline, moist mucus membranes, no frontal or maxillary sinus tenderness, neck supple, trachea midline Pulm: Normal work of breathing, normal phonation, clear to auscultation bilaterally, no wheezes, rales or rhonchi CV: Normal rate, regular rhythm, s1 and s2 distinct, no murmurs, clicks or rubs  Neuro: alert and oriented x 3,  EOM's intact, no tremor MSK: moving all extremities, normal gait and station, no peripheral edema Lymph: no cervical or tonsillar adenopathy Skin: warm, dry, intact; extensor aspect of left forearm with 3 erythematous nodules   No results found for this or any previous visit (from the past 72 hour(s)). No results found.    Assessment and Plan: 66 y.o. female with   1. Allergic contact dermatitis, unspecified trigger - triamcinolone cream (KENALOG) 0.1 %; Apply 1 application topically 2 (two) times daily.  Dispense: 30 g; Refill: 0  2. Need for 23-polyvalent pneumococcal polysaccharide vaccine - Pneumococcal polysaccharide vaccine 23-valent greater than or equal to 2yo subcutaneous/IM  Patient education and anticipatory guidance given Patient agrees with treatment plan Follow-up as needed if symptoms worsen or fail to improve  Darlyne Russian PA-C

## 2017-01-11 ENCOUNTER — Encounter: Payer: Self-pay | Admitting: Physician Assistant

## 2017-02-06 ENCOUNTER — Other Ambulatory Visit: Payer: Self-pay | Admitting: Physician Assistant

## 2017-02-06 DIAGNOSIS — E1169 Type 2 diabetes mellitus with other specified complication: Secondary | ICD-10-CM

## 2017-02-06 DIAGNOSIS — E785 Hyperlipidemia, unspecified: Principal | ICD-10-CM

## 2017-02-21 DIAGNOSIS — I10 Essential (primary) hypertension: Secondary | ICD-10-CM | POA: Diagnosis not present

## 2017-02-21 DIAGNOSIS — E1169 Type 2 diabetes mellitus with other specified complication: Secondary | ICD-10-CM | POA: Diagnosis not present

## 2017-02-21 DIAGNOSIS — I2510 Atherosclerotic heart disease of native coronary artery without angina pectoris: Secondary | ICD-10-CM | POA: Diagnosis not present

## 2017-02-21 DIAGNOSIS — E785 Hyperlipidemia, unspecified: Secondary | ICD-10-CM | POA: Diagnosis not present

## 2017-02-21 DIAGNOSIS — I252 Old myocardial infarction: Secondary | ICD-10-CM | POA: Diagnosis not present

## 2017-03-04 ENCOUNTER — Ambulatory Visit (INDEPENDENT_AMBULATORY_CARE_PROVIDER_SITE_OTHER): Payer: Medicare Other | Admitting: Physician Assistant

## 2017-03-04 ENCOUNTER — Ambulatory Visit (INDEPENDENT_AMBULATORY_CARE_PROVIDER_SITE_OTHER): Payer: Medicare Other

## 2017-03-04 ENCOUNTER — Encounter: Payer: Self-pay | Admitting: Physician Assistant

## 2017-03-04 ENCOUNTER — Other Ambulatory Visit: Payer: Self-pay | Admitting: Physician Assistant

## 2017-03-04 VITALS — BP 100/63 | HR 64 | Wt 176.0 lb

## 2017-03-04 DIAGNOSIS — M1712 Unilateral primary osteoarthritis, left knee: Secondary | ICD-10-CM | POA: Insufficient documentation

## 2017-03-04 DIAGNOSIS — G8929 Other chronic pain: Secondary | ICD-10-CM | POA: Insufficient documentation

## 2017-03-04 DIAGNOSIS — M1711 Unilateral primary osteoarthritis, right knee: Secondary | ICD-10-CM | POA: Diagnosis not present

## 2017-03-04 DIAGNOSIS — M25562 Pain in left knee: Secondary | ICD-10-CM | POA: Diagnosis not present

## 2017-03-04 DIAGNOSIS — R52 Pain, unspecified: Secondary | ICD-10-CM

## 2017-03-04 MED ORDER — CALCIUM CARBONATE-VITAMIN D 600-400 MG-UNIT PO TABS: 1.0000 | ORAL_TABLET | Freq: Two times a day (BID) | ORAL | 11 refills | Status: DC

## 2017-03-04 MED ORDER — DICLOFENAC SODIUM 1 % TD GEL: 4.0000 g | Freq: Four times a day (QID) | TRANSDERMAL | 1 refills | Status: DC

## 2017-03-04 NOTE — Progress Notes (Signed)
HPI:                                                                Sara Gardner is a 66 y.o. female who presents to [facility]: [place] today for knee pain  Patient reports bilateral anterior knee pain, left worse than right, going on for years. Pain is moderate, persistent. Reports knee feels weak when she is walking. Denies recent trauma or injury. She has been using a sample of Voltaren gel with good relief. Denies fever, chills, redness or warmth of the joint, or peripheral edema. Reports a history of falls years ago. Denies fall within the last year.  Past Medical History:  Diagnosis Date  . Cancer ([place])   . Diabetes mellitus without complication ([place])   . Diastolic heart failure, NYHA class 1 ([place])   . GERD (gastroesophageal reflux disease)   . Hypertension   . Myocardial infarction ([place]) 2005  . Papillary thyroid carcinoma ([place])   . Thyroid disease    No past surgical history on file. Social History  Substance Use Topics  . Smoking status: Never Smoker  . Smokeless tobacco: Never Used  . Alcohol use No   family history is not on file.  ROS: negative except as noted in the HPI  Medications: Current Outpatient Prescriptions  Medication Sig Dispense Refill  . AMBULATORY NON FORMULARY MEDICATION Knee-high, medium compression, graduated compression stockings. Apply to lower extremities. [email or web address], Zippered Compression Stockings, medium circ, long length 1 each 0  . ammonium lactate (LAC-HYDRIN) 12 % lotion APP TOPICALLY PRF DRY SKIN  0  . aspirin 81 MG chewable tablet Chew by mouth.    . Blood Glucose Monitoring Suppl (ONE TOUCH ULTRA MINI) w/Device KIT CHECK FASTING BLOOD SUGAR DAILY AND 2 HOURS AFTER LARGEST MEAL OF THE DAY 100 each 1  . EPINEPHrine 0.3 mg/0.3 mL IJ SOAJ injection Inject 1 each as directed as needed. 1 Device 1  . esomeprazole (NEXIUM) 20 MG capsule Take 1 capsule (20 mg total) by mouth daily. 90  capsule 1  . Lancet Devices (ONE TOUCH DELICA LANCING DEV) MISC Check fasting blood sugar daily and 2 hours after largest meal of the day. Dx DM. 100 each prn  . Lancets (ONETOUCH ULTRASOFT) lancets Check fasting blood sugar daily and 2 hours after largest meal of the day. Dx DM. 100 each prn  . levothyroxine (SYNTHROID, LEVOTHROID) 100 MCG tablet Take 100 mcg by mouth daily.  1  . lisinopril-hydrochlorothiazide (PRINZIDE,ZESTORETIC) 10-12.5 MG tablet Take 10-12.5 tablets by mouth daily.    . metFORMIN (GLUCOPHAGE) 1000 MG tablet TAKE 1 TABLET BY MOUTH EVERY MORNING 90 tablet 0  . metoprolol succinate (TOPROL-XL) 50 MG 24 hr tablet TAKE 1 TABLET BY MOUTH EVERY DAY FOR HIGH BLOOD PRESSURE 90 tablet 0  . ONE TOUCH ULTRA TEST test strip CHECK BLOOD SUGAR EVERY DAY AND 2 HOURS AFTER THE LARGEST MEAL OF THE DAY 100 each 0  . triamcinolone cream (KENALOG) 0.1 % Apply 1 application topically 2 (two) times daily. 30 g 0   No current facility-administered medications for this visit.    Allergies  Allergen Reactions  . Bee Venom Swelling  . Tape Rash       Objective:  BP 100/63  Pulse 64   Wt 176 lb (79.8 kg)   SpO2 94%   BMI 31.18 kg/m  Gen: well-groomed, cooperative, not ill-appearing, no distress, appears stated age 66: normal conjunctiva, trachea midline Pulm: Normal work of breathing, normal phonation Neuro: alert and oriented x 3, sensation grossly intact, normal tone, no tremor MSK: left knee with mild swelling, there is crepitus of the joint, lateral joint line tenderness; extremities warm and atraumatic, normal gait and station, no peripheral edema Skin: warm, dry, intact; no rashes on exposed skin, no cyanosis   No results found for this or any previous visit (from the past 72 hour(s)). No results found.    Assessment and Plan: 66 y.o. female with   1. Chronic pain of left knee - DG Knee Complete 4 Views Left; Future - diclofenac sodium (VOLTAREN) 1 % GEL; Apply 4 g  topically 4 (four) times daily. To affected joint.  Dispense: 100 g; Refill: 1  2. Primary osteoarthritis of left knee - personally reviewed x-ray from today, which showed narrowing of the lateral joint space and osteophyte - diclofenac sodium (VOLTAREN) 1 % GEL; Apply 4 g topically 4 (four) times daily. To affected joint.  Dispense: 100 g; Refill: 1  Patient education and anticipatory guidance given Patient agrees with treatment plan Follow-up in 4 months for diabetes or sooner as needed  Darlyne Russian PA-C

## 2017-03-04 NOTE — Patient Instructions (Addendum)
- Voltaren gel applied to left knee up to 4 times daily as needed   Arthritis Arthritis is a term that is commonly used to refer to joint pain or joint disease. There are more than 100 types of arthritis. What are the causes? The most common cause of this condition is wear and tear of a joint. Other causes include:  Gout.  Inflammation of a joint.  An infection of a joint.  Sprains and other injuries near the joint.  A drug reaction or allergic reaction.  In some cases, the cause may not be known. What are the signs or symptoms? The main symptom of this condition is pain in the joint with movement. Other symptoms include:  Redness, swelling, or stiffness at a joint.  Warmth coming from the joint.  Fever.  Overall feeling of illness.  How is this diagnosed? This condition may be diagnosed with a physical exam and tests, including:  Blood tests.  Urine tests.  Imaging tests, such as MRI, X-rays, or a CT scan.  Sometimes, fluid is removed from a joint for testing. How is this treated? Treatment for this condition may involve:  Treatment of the cause, if it is known.  Rest.  Raising (elevating) the joint.  Applying cold or hot packs to the joint.  Medicines to improve symptoms and reduce inflammation.  Injections of a steroid such as cortisone into the joint to help reduce pain and inflammation.  Depending on the cause of your arthritis, you may need to make lifestyle changes to reduce stress on your joint. These changes may include exercising more and losing weight. Follow these instructions at home: Medicines  Take over-the-counter and prescription medicines only as told by your health care provider.  Do not take aspirin to relieve pain if gout is suspected. Activity  Rest your joint if told by your health care provider. Rest is important when your disease is active and your joint feels painful, swollen, or stiff.  Avoid activities that make the pain  worse. It is important to balance activity with rest.  Exercise your joint regularly with range-of-motion exercises as told by your health care provider. Try doing low-impact exercise, such as: ? Swimming. ? Water aerobics. ? Biking. ? Walking. Joint Care   If your joint is swollen, keep it elevated if told by your health care provider.  If your joint feels stiff in the morning, try taking a warm shower.  If directed, apply heat to the joint. If you have diabetes, do not apply heat without permission from your health care provider. ? Put a towel between the joint and the hot pack or heating pad. ? Leave the heat on the area for 20-30 minutes.  If directed, apply ice to the joint: ? Put ice in a plastic bag. ? Place a towel between your skin and the bag. ? Leave the ice on for 20 minutes, 2-3 times per day.  Keep all follow-up visits as told by your health care provider. This is important. Contact a health care provider if:  The pain gets worse.  You have a fever. Get help right away if:  You develop severe joint pain, swelling, or redness.  Many joints become painful and swollen.  You develop severe back pain.  You develop severe weakness in your leg.  You cannot control your bladder or bowels. This information is not intended to replace advice given to you by your health care provider. Make sure you discuss any questions you have with  your health care provider. Document Released: 08/19/2004 Document Revised: 12/18/2015 Document Reviewed: 10/07/2014 Elsevier Interactive Patient Education  [employer].

## 2017-03-07 ENCOUNTER — Encounter: Payer: Self-pay | Admitting: Physician Assistant

## 2017-03-07 ENCOUNTER — Ambulatory Visit (INDEPENDENT_AMBULATORY_CARE_PROVIDER_SITE_OTHER): Payer: Medicare Other | Admitting: Physician Assistant

## 2017-03-07 VITALS — BP 96/65 | HR 75 | Wt 176.0 lb

## 2017-03-07 DIAGNOSIS — R109 Unspecified abdominal pain: Secondary | ICD-10-CM

## 2017-03-07 DIAGNOSIS — Z5321 Procedure and treatment not carried out due to patient leaving prior to being seen by health care provider: Secondary | ICD-10-CM | POA: Diagnosis not present

## 2017-03-07 LAB — POCT URINALYSIS DIPSTICK
BILIRUBIN UA: NEGATIVE
Glucose, UA: NEGATIVE
KETONES UA: NEGATIVE
Leukocytes, UA: NEGATIVE
Nitrite, UA: NEGATIVE
PH UA: 7 (ref 5.0–8.0)
Protein, UA: NEGATIVE
RBC UA: NEGATIVE
Spec Grav, UA: 1.015 (ref 1.010–1.025)
Urobilinogen, UA: 1 E.U./dL

## 2017-03-07 NOTE — Progress Notes (Signed)
Knee x-rays show bilateral arthritis, worse in the left knee.

## 2017-03-08 ENCOUNTER — Telehealth: Payer: Self-pay | Admitting: Physician Assistant

## 2017-03-08 DIAGNOSIS — H02834 Dermatochalasis of left upper eyelid: Secondary | ICD-10-CM | POA: Diagnosis not present

## 2017-03-08 DIAGNOSIS — H02831 Dermatochalasis of right upper eyelid: Secondary | ICD-10-CM | POA: Diagnosis not present

## 2017-03-08 DIAGNOSIS — H25813 Combined forms of age-related cataract, bilateral: Secondary | ICD-10-CM | POA: Diagnosis not present

## 2017-03-08 DIAGNOSIS — R109 Unspecified abdominal pain: Secondary | ICD-10-CM

## 2017-03-08 DIAGNOSIS — H527 Unspecified disorder of refraction: Secondary | ICD-10-CM | POA: Diagnosis not present

## 2017-03-08 DIAGNOSIS — E119 Type 2 diabetes mellitus without complications: Secondary | ICD-10-CM | POA: Diagnosis not present

## 2017-03-08 LAB — HM DIABETES EYE EXAM

## 2017-03-08 NOTE — Telephone Encounter (Signed)
Patient presented to clinic c/o right flank pain x 4 days on 03/07/17 Endorsed urinary frequency Denies dysuria, urgency, hematuria No fever, chills, confusion  She left without being seen by me because she had an appointment at 5pm Her UA was negative  Encouraged to follow-up

## 2017-03-09 ENCOUNTER — Ambulatory Visit (INDEPENDENT_AMBULATORY_CARE_PROVIDER_SITE_OTHER): Payer: Medicare Other

## 2017-03-09 ENCOUNTER — Encounter: Payer: Self-pay | Admitting: Physician Assistant

## 2017-03-09 ENCOUNTER — Other Ambulatory Visit: Payer: Self-pay | Admitting: Physician Assistant

## 2017-03-09 ENCOUNTER — Ambulatory Visit (INDEPENDENT_AMBULATORY_CARE_PROVIDER_SITE_OTHER): Payer: Medicare Other | Admitting: Physician Assistant

## 2017-03-09 VITALS — BP 102/64 | HR 78 | Temp 97.9°F | Wt 176.0 lb

## 2017-03-09 DIAGNOSIS — M5136 Other intervertebral disc degeneration, lumbar region: Secondary | ICD-10-CM

## 2017-03-09 DIAGNOSIS — M4003 Postural kyphosis, cervicothoracic region: Secondary | ICD-10-CM | POA: Diagnosis not present

## 2017-03-09 DIAGNOSIS — M51369 Other intervertebral disc degeneration, lumbar region without mention of lumbar back pain or lower extremity pain: Secondary | ICD-10-CM

## 2017-03-09 DIAGNOSIS — I700 Atherosclerosis of aorta: Secondary | ICD-10-CM | POA: Diagnosis not present

## 2017-03-09 DIAGNOSIS — M545 Low back pain, unspecified: Secondary | ICD-10-CM

## 2017-03-09 DIAGNOSIS — N281 Cyst of kidney, acquired: Secondary | ICD-10-CM

## 2017-03-09 HISTORY — DX: Other intervertebral disc degeneration, lumbar region: M51.36

## 2017-03-09 HISTORY — DX: Other intervertebral disc degeneration, lumbar region without mention of lumbar back pain or lower extremity pain: M51.369

## 2017-03-09 MED ORDER — NAPROXEN 500 MG PO TABS: 500.0000 mg | ORAL_TABLET | Freq: Two times a day (BID) | ORAL | 0 refills | Status: DC

## 2017-03-09 MED ORDER — PREDNISONE 50 MG PO TABS: ORAL_TABLET | ORAL | 0 refills | Status: DC

## 2017-03-09 NOTE — Progress Notes (Signed)
X-ray shows degenerative disc disease in the lower spine as we suspected Treatment plan does not change

## 2017-03-09 NOTE — Progress Notes (Signed)
HPI:                                                                Sara Gardner is a 66 y.o. female who presents to [facility]: [place] today for low back pain  Onset: 5 days ago Location: low back, right worse than left Duration: constant Character: tender, achey, moderate Aggravating factors: worse with sitting, tender to palpation Relieving factors: laying down Denies radicular symptoms. Denies constitutional symptoms. Denies saddle numbness. Denies bowel or bladder dysfunction. Denies known injury or trauma History of osteoarthritis History of renal cyst on upper pole of right kidney, MRI 2016  Past Medical History:  Diagnosis Date  . Cancer ([place])   . Diabetes mellitus without complication ([place])   . Diastolic heart failure, NYHA class 1 ([place])   . GERD (gastroesophageal reflux disease)   . Hypertension   . Myocardial infarction ([place]) 2005  . Papillary thyroid carcinoma ([place])   . Thyroid disease    No past surgical history on file. Social History  Substance Use Topics  . Smoking status: Never Smoker  . Smokeless tobacco: Never Used  . Alcohol use No   family history is not on file.  ROS: negative except as noted in the HPI  Medications: Current Outpatient Prescriptions  Medication Sig Dispense Refill  . AMBULATORY NON FORMULARY MEDICATION Knee-high, medium compression, graduated compression stockings. Apply to lower extremities. [email or web address], Zippered Compression Stockings, medium circ, long length 1 each 0  . ammonium lactate (LAC-HYDRIN) 12 % lotion APP TOPICALLY PRF DRY SKIN  0  . aspirin 81 MG chewable tablet Chew by mouth.    . Blood Glucose Monitoring Suppl (ONE TOUCH ULTRA MINI) w/Device KIT CHECK FASTING BLOOD SUGAR DAILY AND 2 HOURS AFTER LARGEST MEAL OF THE DAY 100 each 1  . Calcium Carbonate-Vitamin D 600-400 MG-UNIT tablet Take 1 tablet by mouth 2 (two) times daily. 60 tablet 11  . diclofenac  sodium (VOLTAREN) 1 % GEL Apply 4 g topically 4 (four) times daily. To affected joint. 100 g 1  . EPINEPHrine 0.3 mg/0.3 mL IJ SOAJ injection Inject 1 each as directed as needed. 1 Device 1  . esomeprazole (NEXIUM) 20 MG capsule Take 1 capsule (20 mg total) by mouth daily. 90 capsule 1  . Lancet Devices (ONE TOUCH DELICA LANCING DEV) MISC Check fasting blood sugar daily and 2 hours after largest meal of the day. Dx DM. 100 each prn  . Lancets (ONETOUCH ULTRASOFT) lancets Check fasting blood sugar daily and 2 hours after largest meal of the day. Dx DM. 100 each prn  . levothyroxine (SYNTHROID, LEVOTHROID) 100 MCG tablet Take 100 mcg by mouth daily.  1  . lisinopril-hydrochlorothiazide (PRINZIDE,ZESTORETIC) 10-12.5 MG tablet Take 10-12.5 tablets by mouth daily.    . metFORMIN (GLUCOPHAGE) 1000 MG tablet TAKE 1 TABLET BY MOUTH EVERY MORNING 90 tablet 0  . metoprolol succinate (TOPROL-XL) 50 MG 24 hr tablet TAKE 1 TABLET BY MOUTH EVERY DAY FOR HIGH BLOOD PRESSURE 90 tablet 0  . ONE TOUCH ULTRA TEST test strip CHECK BLOOD SUGAR EVERY DAY AND 2 HOURS AFTER THE LARGEST MEAL OF THE DAY 100 each 0  . rosuvastatin (CRESTOR) 20 MG tablet Take 20 mg by mouth.  5  .  triamcinolone cream (KENALOG) 0.1 % Apply 1 application topically 2 (two) times daily. 30 g 0   No current facility-administered medications for this visit.    Allergies  Allergen Reactions  . Bee Venom Swelling  . Tape Rash       Objective:  BP 102/64   Pulse 78   Temp 97.9 F (36.6 C) (Oral)   Wt 176 lb (79.8 kg)   BMI 31.18 kg/m  Gen: obese female, well-groomed, cooperative, not ill-appearing, no distress HEENT: normal conjunctiva, trachea midline Pulm: Normal work of breathing, normal phonation Neuro: alert and oriented x 3, sensation grossly intact, normal tone, no tremor MSK: kyphosis of cervicothoracic spine, back atraumatic, no spinous process tenderness or step-off deformity, there is paraspinal tenderness on the right  lumbar spine near the SI joint, full active ROM except for reduced ROM and pain with right lateral bend, negative straight leg raise; hip flexor strength 5/5 bilaterally;  extremities atraumatic, normal gait and station Skin: warm, dry, intact; no rashes on exposed skin   Results for orders placed or performed in visit on 03/07/17 (from the past 72 hour(s))  POCT Urinalysis Dipstick     Status: Normal   Collection Time: 03/07/17  4:50 PM  Result Value Ref Range   Color, UA yellow    Clarity, UA clear    Glucose, UA negative    Bilirubin, UA negative    Ketones, UA negative    Spec Grav, UA 1.015 1.010 - 1.025   Blood, UA negative    pH, UA 7.0 5.0 - 8.0   Protein, UA negative    Urobilinogen, UA 1.0 0.2 or 1.0 E.U./dL   Nitrite, UA negative    Leukocytes, UA Negative Negative   No results found.    Assessment and Plan: 66 y.o. female with   1. 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 No red flag symptoms - prednisone burst followed by Naprosyn. She does have a history of CAD and AMI, so will limit use of Naprosyn to short course. Formal PT. F/U sports medicine in 6 weeks. - DG Lumbar Spine Complete; Future - Ambulatory referral to Physical Therapy - predniSONE (DELTASONE) 50 MG tablet; One tab PO daily for 5 days.  Dispense: 5 tablet; Refill: 0 - naproxen (NAPROSYN) 500 MG tablet; Take 1 tablet (500 mg total) by mouth 2 (two) times daily with a meal.  Dispense: 30 tablet; Refill: 0  2. Postural kyphosis of cervicothoracic region - DEXA normal 08/06/2016   Patient education and anticipatory guidance given Patient agrees with treatment plan Follow-up with Sports Medicine in 6 weeks or sooner as needed if symptoms worsen or  fail to improve  Darlyne Russian PA-C

## 2017-03-09 NOTE — Progress Notes (Signed)
Patient presented to clinic c/o right flank pain x 4 days Endorsed urinary frequency Denies dysuria, urgency, hematuria No fever, chills, confusion  She left without being seen by me because she had an appointment at 5pm Her UA was negative  Vitals:   03/07/17 1634  BP: 96/65  Pulse: 75    Encouraged to follow-up

## 2017-03-09 NOTE — Patient Instructions (Signed)
- X-ray of your lower spine today - Prednisone once daily for 5 days - Once your finish prednisone, start Naproxen twice a day - Formal Physical Therapy - Follow-up with Sports Medicine in 6 weeks or sooner if needed   Back Pain, Adult Back pain is very common in adults.The cause of back pain is rarely dangerous and the pain often gets better over time.The cause of your back pain may not be known. Some common causes of back pain include:  Strain of the muscles or ligaments supporting the spine.  Wear and tear (degeneration) of the spinal disks.  Arthritis.  Direct injury to the back.  For many people, back pain may return. Since back pain is rarely dangerous, most people can learn to manage this condition on their own. Follow these instructions at home: Watch your back pain for any changes. The following actions may help to lessen any discomfort you are feeling:  Remain active. It is stressful on your back to sit or stand in one place for long periods of time. Do not sit, drive, or stand in one place for more than 30 minutes at a time. Take short walks on even surfaces as soon as you are able.Try to increase the length of time you walk each day.  Exercise regularly as directed by your health care provider. Exercise helps your back heal faster. It also helps avoid future injury by keeping your muscles strong and flexible.  Do not stay in bed.Resting more than 1-2 days can delay your recovery.  Pay attention to your body when you bend and lift. The most comfortable positions are those that put less stress on your recovering back. Always use proper lifting techniques, including: ? Bending your knees. ? Keeping the load close to your body. ? Avoiding twisting.  Find a comfortable position to sleep. Use a firm mattress and lie on your side with your knees slightly bent. If you lie on your back, put a pillow under your knees.  Avoid feeling anxious or stressed.Stress increases  muscle tension and can worsen back pain.It is important to recognize when you are anxious or stressed and learn ways to manage it, such as with exercise.  Take medicines only as directed by your health care provider. Over-the-counter medicines to reduce pain and inflammation are often the most helpful.Your health care provider may prescribe muscle relaxant drugs.These medicines help dull your pain so you can more quickly return to your normal activities and healthy exercise.  Apply ice to the injured area: ? Put ice in a plastic bag. ? Place a towel between your skin and the bag. ? Leave the ice on for 20 minutes, 2-3 times a day for the first 2-3 days. After that, ice and heat may be alternated to reduce pain and spasms.  Maintain a healthy weight. Excess weight puts extra stress on your back and makes it difficult to maintain good posture.  Contact a health care provider if:  You have pain that is not relieved with rest or medicine.  You have increasing pain going down into the legs or buttocks.  You have pain that does not improve in one week.  You have night pain.  You lose weight.  You have a fever or chills. Get help right away if:  You develop new bowel or bladder control problems.  You have unusual weakness or numbness in your arms or legs.  You develop nausea or vomiting.  You develop abdominal pain.  You feel faint. This  information is not intended to replace advice given to you by your health care provider. Make sure you discuss any questions you have with your health care provider. Document Released: 07/12/2005 Document Revised: 11/20/2015 Document Reviewed: 11/13/2013 Elsevier Interactive Patient Education  2017 [employer].

## 2017-03-12 ENCOUNTER — Other Ambulatory Visit: Payer: Self-pay | Admitting: Physician Assistant

## 2017-03-16 ENCOUNTER — Ambulatory Visit: Payer: Medicare Other | Admitting: Physician Assistant

## 2017-03-17 ENCOUNTER — Ambulatory Visit: Payer: Medicare Other | Admitting: Rehabilitative and Restorative Service Providers"

## 2017-03-22 ENCOUNTER — Ambulatory Visit: Payer: Medicare Other | Admitting: Physical Therapy

## 2017-03-25 ENCOUNTER — Other Ambulatory Visit: Payer: Self-pay | Admitting: Physician Assistant

## 2017-04-07 ENCOUNTER — Encounter: Payer: Self-pay | Admitting: Physician Assistant

## 2017-04-07 DIAGNOSIS — H43811 Vitreous degeneration, right eye: Secondary | ICD-10-CM

## 2017-04-07 DIAGNOSIS — H02836 Dermatochalasis of left eye, unspecified eyelid: Secondary | ICD-10-CM | POA: Insufficient documentation

## 2017-04-07 DIAGNOSIS — H25813 Combined forms of age-related cataract, bilateral: Secondary | ICD-10-CM

## 2017-04-07 DIAGNOSIS — H02833 Dermatochalasis of right eye, unspecified eyelid: Secondary | ICD-10-CM

## 2017-04-07 HISTORY — DX: Vitreous degeneration, right eye: H43.811

## 2017-04-07 HISTORY — DX: Combined forms of age-related cataract, bilateral: H25.813

## 2017-04-07 HISTORY — DX: Dermatochalasis of left eye, unspecified eyelid: H02.836

## 2017-04-07 HISTORY — DX: Dermatochalasis of right eye, unspecified eyelid: H02.833

## 2017-04-22 ENCOUNTER — Encounter: Payer: Self-pay | Admitting: Physician Assistant

## 2017-05-03 DIAGNOSIS — H25813 Combined forms of age-related cataract, bilateral: Secondary | ICD-10-CM | POA: Diagnosis not present

## 2017-05-12 ENCOUNTER — Ambulatory Visit (INDEPENDENT_AMBULATORY_CARE_PROVIDER_SITE_OTHER): Payer: Medicare Other | Admitting: Physician Assistant

## 2017-05-12 ENCOUNTER — Other Ambulatory Visit (HOSPITAL_COMMUNITY)
Admission: RE | Admit: 2017-05-12 | Discharge: 2017-05-12 | Disposition: A | Discharge status: Home / Self Care | Payer: Medicare Other | Source: Ambulatory Visit | Attending: Physician Assistant | Admitting: Physician Assistant

## 2017-05-12 ENCOUNTER — Encounter: Payer: Self-pay | Admitting: Physician Assistant

## 2017-05-12 VITALS — BP 111/75 | HR 67 | Temp 98.0°F | Wt 177.0 lb

## 2017-05-12 DIAGNOSIS — H259 Unspecified age-related cataract: Secondary | ICD-10-CM

## 2017-05-12 DIAGNOSIS — Z124 Encounter for screening for malignant neoplasm of cervix: Secondary | ICD-10-CM

## 2017-05-12 DIAGNOSIS — N760 Acute vaginitis: Secondary | ICD-10-CM

## 2017-05-12 DIAGNOSIS — H43811 Vitreous degeneration, right eye: Secondary | ICD-10-CM | POA: Insufficient documentation

## 2017-05-12 DIAGNOSIS — I10 Essential (primary) hypertension: Secondary | ICD-10-CM

## 2017-05-12 LAB — POCT URINALYSIS DIPSTICK
Bilirubin, UA: NEGATIVE
Blood, UA: NEGATIVE
Glucose, UA: NEGATIVE
KETONES UA: NEGATIVE
LEUKOCYTES UA: NEGATIVE
Nitrite, UA: NEGATIVE
PH UA: 6.5 (ref 5.0–8.0)
PROTEIN UA: NEGATIVE
SPEC GRAV UA: 1.01 (ref 1.010–1.025)
UROBILINOGEN UA: 0.2 U/dL

## 2017-05-12 LAB — WET PREP FOR TRICH, YEAST, CLUE
MICRO NUMBER:: 81165250
SPECIMEN QUALITY 3963: ADEQUATE

## 2017-05-12 MED ORDER — LISINOPRIL 5 MG PO TABS
5.0000 mg | ORAL_TABLET | Freq: Every day | ORAL | 0 refills | Status: DC
Start: 2017-05-12 — End: 2017-06-03

## 2017-05-12 NOTE — Addendum Note (Signed)
Addended by: Delrae Alfred on: 05/12/2017 03:22 PM   Modules accepted: Orders

## 2017-05-12 NOTE — Progress Notes (Signed)
HPI:                                                                Sara Gardner is a 66 y.o. female who presents to [facility] today for vaginal discomfort  Patient presents today with vaginal itching and burning for 1 week. Also endorses dysuria and frequency. Reports that she accidentally used a dirty cleaning rag to wash her vulva. She is not sexually active. Denies fever, chills, abdominal/pelvic pain, vaginal bleeding.  She is also requesting a referral to a new ophthalmologist. She is needing cataract surgery, but she did not like the previous doctor.  She is also wondering if she can stop one of her blood pressure medications. Reports that her blood pressures are <130/80 without taking her medications.  Past Medical History:  Diagnosis Date  . Cancer ([place])   . 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 Dermatochalasis of both eyelids 04/07/2017  . Diabetes mellitus without complication ([place])   . Diastolic heart failure, NYHA class 1 ([place])   . GERD (gastroesophageal reflux disease)   . Hypertension   . Lumbar degenerative disc disease 03/09/2017  . Myocardial infarction ([place]) 2005  . Papillary thyroid carcinoma ([place])   . Posterior vitreous detachment, right eye 04/07/2017  . Thyroid disease    No past surgical history on file. Social History  Substance Use Topics  . Smoking status: Never Smoker  . Smokeless tobacco: Never Used  . Alcohol use No   family history is not on file.  ROS: negative except as noted in the HPI  Medications: Current Outpatient Prescriptions  Medication Sig Dispense Refill  . AMBULATORY NON FORMULARY MEDICATION Knee-high, medium compression, graduated compression stockings. Apply to lower extremities. [email or web address], Zippered Compression Stockings, medium circ, long length 1 each 0  . ammonium lactate (LAC-HYDRIN) 12 % lotion APP TOPICALLY PRF DRY SKIN  0  .  aspirin 81 MG chewable tablet Chew by mouth.    . Blood Glucose Monitoring Suppl (ONE TOUCH ULTRA MINI) w/Device KIT CHECK FASTING BLOOD SUGAR DAILY AND 2 HOURS AFTER LARGEST MEAL OF THE DAY 100 each 1  . diclofenac sodium (VOLTAREN) 1 % GEL Apply 4 g topically 4 (four) times daily. To affected joint. 100 g 1  . EPINEPHrine 0.3 mg/0.3 mL IJ SOAJ injection Inject 1 each as directed as needed. 1 Device 1  . esomeprazole (NEXIUM) 20 MG capsule Take 1 capsule (20 mg total) by mouth daily. 90 capsule 1  . Lancet Devices (ONE TOUCH DELICA LANCING DEV) MISC Check fasting blood sugar daily and 2 hours after largest meal of the day. Dx DM. 100 each prn  . Lancets (ONETOUCH ULTRASOFT) lancets Check fasting blood sugar daily and 2 hours after largest meal of the day. Dx DM. 100 each prn  . levothyroxine (SYNTHROID, LEVOTHROID) 100 MCG tablet Take 100 mcg by mouth daily.  1  . lisinopril-hydrochlorothiazide (PRINZIDE,ZESTORETIC) 10-12.5 MG tablet TAKE 1 TABLET BY MOUTH EVERY DAY 90 tablet 1  . metFORMIN (GLUCOPHAGE) 1000 MG tablet TAKE 1 TABLET BY MOUTH EVERY MORNING 90 tablet 2  . metoprolol succinate (TOPROL-XL) 50 MG 24 hr tablet TAKE 1 TABLET BY MOUTH EVERY DAY FOR HIGH BLOOD PRESSURE 90 tablet 0  .  ONE TOUCH ULTRA TEST test strip CHECK BLOOD SUGAR EVERY DAY AND 2 HOURS AFTER THE LARGEST MEAL OF THE DAY 100 each 0  . predniSONE (DELTASONE) 50 MG tablet One tab PO daily for 5 days. 5 tablet 0  . rosuvastatin (CRESTOR) 20 MG tablet Take 20 mg by mouth.  5  . Calcium Carbonate-Vitamin D 600-400 MG-UNIT tablet Take 1 tablet by mouth 2 (two) times daily. (Patient not taking: Reported on 05/12/2017) 60 tablet 11   No current facility-administered medications for this visit.    Allergies  Allergen Reactions  . Bee Venom Swelling  . Tape Rash       Objective:  BP 111/75   Pulse 67   Temp 98 F (36.7 C) (Oral)   Wt 177 lb (80.3 kg)   BMI 31.35 kg/m  Gen:  alert, not ill-appearing, no distress,  appropriate for age, obese female HEENT: head normocephalic without obvious abnormality, conjunctiva and cornea clear, trachea midline Pulm: Normal work of breathing, normal phonation Neuro: alert and oriented x 3, no tremor GU: vulva without rashes or lesions, normal introitus and urethral meatus, vaginal mucosa without erythema, small amount of white discharge, cervix non-friable without lesions MSK: extremities atraumatic, normal gait and station Skin: intact, no rashes on exposed skin, no jaundice, no cyanosis Psych: well-groomed, cooperative, good eye contact, euthymic mood, affect mood-congruent, speech is articulate, and thought processes clear and goal-directed  Depression screen [facility] 2/9 05/12/2017  Decreased Interest 0  Down, Depressed, Hopeless 0  PHQ - 2 Score 0     A chaperone was used for the GU portion of the exam, Izell Keene, CMA.   No results found for this or any previous visit (from the past 72 hour(s)). No results found.    Assessment and Plan: 66 y.o. female with   1. Encounter for Pap smear of cervix with HPV DNA cotesting - patient is over 89, but there is no record of cervical cancer screening. This will be her last Pap smear. - Cytology - PAP w/HPV co-testing  2. Acute vaginitis - UA negative - WET PREP FOR TRICH, YEAST, CLUE pending  3. Age-related cataract of both eyes, unspecified age-related cataract type - Ambulatory referral to Ophthalmology  4. Hypertension goal BP (blood pressure) < 140/90 BP Readings from Last 3 Encounters:  05/12/17 111/75  03/09/17 102/64  03/07/17 96/65  - discussed that important for her to remain on an ACE for her diabetes and a beta blocker for her CAD. However, I think it's reasonable to eliminate the HCTZ in her combo pill to prevent hypotension - lisinopril (PRINIVIL,ZESTRIL) 5 MG tablet; Take 1 tablet (5 mg total) by mouth daily.  Dispense: 90 tablet; Refill: 0    Patient education and anticipatory guidance  given Patient agrees with treatment plan Follow-up in 2 weeks for BP check as needed if symptoms worsen or fail to improve  Darlyne Russian PA-C

## 2017-05-12 NOTE — Patient Instructions (Addendum)
For your blood pressure: - blood pressure goal <140/90 - stop Lisinopril-Hydrochlorothiazide - start Lisinopril 5mg  daily - continue your Metoprolol - cont baby aspirin 81 mg - Limit salt to <2000 mg/day - Follow DASH eating plan - limit alcohol to 1 standard drink per day - avoid tobacco products - weight loss: 7% of current body weight - Follow-up in 2 weeks    Vaginitis Vaginitis is a condition in which the vaginal tissue swells and becomes red (inflamed). This condition is most often caused by a change in the normal balance of bacteria and yeast that live in the vagina. This change causes an overgrowth of certain bacteria or yeast, which causes the inflammation. There are different types of vaginitis, but the most common types are:  Bacterial vaginosis.  Yeast infection (candidiasis).  Trichomoniasis vaginitis. This is a sexually transmitted disease (STD).  Viral vaginitis.  Atrophic vaginitis.  Allergic vaginitis.  What are the causes? The cause of this condition depends on the type of vaginitis. It can be caused by:  Bacteria (bacterial vaginosis).  Yeast, which is a fungus (yeast infection).  A parasite (trichomoniasis vaginitis).  A virus (viral vaginitis).  Low hormone levels (atrophic vaginitis). Low hormone levels can occur during pregnancy, breastfeeding, or after menopause.  Irritants, such as bubble baths, scented tampons, and feminine sprays (allergic vaginitis).  Other factors can change the normal balance of the yeast and bacteria that live in the vagina. These include:  Antibiotic medicines.  Poor hygiene.  Diaphragms, vaginal sponges, spermicides, birth control pills, and intrauterine devices (IUD).  Sex.  Infection.  Uncontrolled diabetes.  A weakened defense (immune) system.  What increases the risk? This condition is more likely to develop in women who:  Smoke.  Use vaginal douches, scented tampons, or scented sanitary  pads.  Wear tight-fitting pants.  Wear thong underwear.  Use oral birth control pills or an IUD.  Have sex without a condom.  Have multiple sex partners.  Have an STD.  Frequently use the spermicide nonoxynol-9.  Eat lots of foods high in sugar.  Have uncontrolled diabetes.  Have low estrogen levels.  Have a weakened immune system from an immune disorder or medical treatment.  Are pregnant or breastfeeding.  What are the signs or symptoms? Symptoms vary depending on the cause of the vaginitis. Common symptoms include:  Abnormal vaginal discharge. ? The discharge is white, gray, or yellow with bacterial vaginosis. ? The discharge is thick, white, and cheesy with a yeast infection. ? The discharge is frothy and yellow or greenish with trichomoniasis.  A bad vaginal smell. The smell is fishy with bacterial vaginosis.  Vaginal itching, pain, or swelling.  Sex that is painful.  Pain or burning when urinating.  Sometimes there are no symptoms. How is this diagnosed? This condition is diagnosed based on your symptoms and medical history. A physical exam, including a pelvic exam, will also be done. You may also have other tests, including:  Tests to determine the pH level (acidity or alkalinity) of your vagina.  A whiff test, to assess the odor that results when a sample of your vaginal discharge is mixed with a potassium hydroxide solution.  Tests of vaginal fluid. A sample will be examined under a microscope.  How is this treated? Treatment varies depending on the type of vaginitis you have. Your treatment may include:  Antibiotic creams or pills to treat bacterial vaginosis and trichomoniasis.  Antifungal medicines, such as vaginal creams or suppositories, to treat a yeast infection.  Medicine to ease discomfort if you have viral vaginitis. Your sexual partner should also be treated.  Estrogen delivered in a cream, pill, suppository, or vaginal ring to treat  atrophic vaginitis. If vaginal dryness occurs, lubricants and moisturizing creams may help. You may need to avoid scented soaps, sprays, or douches.  Stopping use of a product that is causing allergic vaginitis. Then using a vaginal cream to treat the symptoms.  Follow these instructions at home: Lifestyle  Keep your genital area clean and dry. Avoid soap, and only rinse the area with water.  Do not douche or use tampons until your health care provider says it is okay to do so. Use sanitary pads, if needed.  Do not have sex until your health care provider approves. When you can return to sex, practice safe sex and use condoms.  Wipe from front to back. This avoids the spread of bacteria from the rectum to the vagina. General instructions  Take over-the-counter and prescription medicines only as told by your health care provider.  If you were prescribed an antibiotic medicine, take or use it as told by your health care provider. Do not stop taking or using the antibiotic even if you start to feel better.  Keep all follow-up visits as told by your health care provider. This is important. How is this prevented?  Use mild, non-scented products. Do not use things that can irritate the vagina, such as fabric softeners. Avoid the following products if they are scented: ? Feminine sprays. ? Detergents. ? Tampons. ? Feminine hygiene products. ? Soaps or bubble baths.  Let air reach your genital area. ? Wear cotton underwear to reduce moisture buildup. ? Avoid wearing underwear while you sleep. ? Avoid wearing tight pants and underwear or nylons without a cotton panel. ? Avoid wearing thong underwear.  Take off any wet clothing, such as bathing suits, as soon as possible.  Practice safe sex and use condoms. Contact a health care provider if:  You have abdominal pain.  You have a fever.  You have symptoms that last for more than 2-3 days. Get help right away if:  You have a fever  and your symptoms suddenly get worse. Summary  Vaginitis is a condition in which the vaginal tissue becomes inflamed.This condition is most often caused by a change in the normal balance of bacteria and yeast that live in the vagina.  Treatment varies depending on the type of vaginitis you have.  Do not douche, use tampons , or have sex until your health care provider approves. When you can return to sex, practice safe sex and use condoms. This information is not intended to replace advice given to you by your health care provider. Make sure you discuss any questions you have with your health care provider. Document Released: 05/09/2007 Document Revised: 08/17/2016 Document Reviewed: 08/17/2016 Elsevier Interactive Patient Education  [employer].

## 2017-05-13 NOTE — Progress Notes (Signed)
No evidence of yeast or bacterial infection This is likely an allergic or irritant vaginitis Recommend OTC Hydrocortisone cream applied to the vulva nightly Can also take Benadryl at bedtime for itching as needed Follow-up if no improvement in 7-10 days

## 2017-05-17 DIAGNOSIS — E119 Type 2 diabetes mellitus without complications: Secondary | ICD-10-CM | POA: Diagnosis not present

## 2017-05-17 DIAGNOSIS — H2513 Age-related nuclear cataract, bilateral: Secondary | ICD-10-CM | POA: Diagnosis not present

## 2017-05-18 LAB — CYTOLOGY - PAP
DIAGNOSIS: NEGATIVE
HPV: NOT DETECTED

## 2017-05-18 NOTE — Progress Notes (Signed)
Pap smear was normal.  No longer needs cervical cancer screening

## 2017-05-25 DIAGNOSIS — M7751 Other enthesopathy of right foot: Secondary | ICD-10-CM | POA: Diagnosis not present

## 2017-05-25 DIAGNOSIS — M7732 Calcaneal spur, left foot: Secondary | ICD-10-CM | POA: Diagnosis not present

## 2017-05-25 DIAGNOSIS — E1142 Type 2 diabetes mellitus with diabetic polyneuropathy: Secondary | ICD-10-CM | POA: Diagnosis not present

## 2017-05-25 DIAGNOSIS — M7752 Other enthesopathy of left foot: Secondary | ICD-10-CM | POA: Diagnosis not present

## 2017-05-25 DIAGNOSIS — I2510 Atherosclerotic heart disease of native coronary artery without angina pectoris: Secondary | ICD-10-CM | POA: Diagnosis not present

## 2017-05-26 ENCOUNTER — Ambulatory Visit: Payer: Medicare Other | Admitting: Physician Assistant

## 2017-06-02 ENCOUNTER — Telehealth: Payer: Self-pay | Admitting: *Deleted

## 2017-06-02 NOTE — Telephone Encounter (Signed)
Patient called stating that she is having L breast discomt. And asked if you could check the date of her last mammogram.

## 2017-06-02 NOTE — Telephone Encounter (Signed)
Left a message for patient to call back to schedule a follow up. Also left recommendations.

## 2017-06-02 NOTE — Telephone Encounter (Signed)
Patient's mammogram is up to date. She is due in January. I recommend she come in for an evaluation, so we can decide if any diagnostic imaging is appropriate In the meantime, she can apply ice/heat to the area (whichever feels better), Tylenol or Ibuprofen, and wear a supportive bra

## 2017-06-03 ENCOUNTER — Encounter: Payer: Self-pay | Admitting: Physician Assistant

## 2017-06-03 ENCOUNTER — Ambulatory Visit (INDEPENDENT_AMBULATORY_CARE_PROVIDER_SITE_OTHER): Payer: Medicare Other | Admitting: Physician Assistant

## 2017-06-03 VITALS — BP 154/95 | HR 61 | Temp 98.1°F | Wt 177.0 lb

## 2017-06-03 DIAGNOSIS — R0789 Other chest pain: Secondary | ICD-10-CM | POA: Diagnosis not present

## 2017-06-03 DIAGNOSIS — I10 Essential (primary) hypertension: Secondary | ICD-10-CM

## 2017-06-03 MED ORDER — LISINOPRIL 10 MG PO TABS: 10.0000 mg | ORAL_TABLET | Freq: Every day | ORAL | 5 refills | Status: DC

## 2017-06-03 NOTE — Progress Notes (Signed)
HPI:                                                                Sara Gardner is a 66 y.o. female who presents to [facility]: [facility] today for left breast tenderness  This pleasant 66 yo postmenopausal female presents today with left breast tednerness x 4-5 days. Pain is localized to the upper quadrants and the left axilla. Reports breast is only tender to palpation, there is no pain at rest. Denies known injury or trauma. Denies fever, chills, skin changes, nipple discharge or palpable mass. She has not tried any treatments for this. Her mammogram is up to date and she has no history of abnormal mammograms. This is a recurrent problem for her. She had a diagnostic mammogram and ultrasound in 2016 for localized left breast pain, which were negative.  Past Medical History:  Diagnosis Date  . Cancer ([place])   . 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 Dermatochalasis of both eyelids 04/07/2017  . Diabetes mellitus without complication ([place])   . Diastolic heart failure, NYHA class 1 ([place])   . GERD (gastroesophageal reflux disease)   . Hypertension   . Lumbar degenerative disc disease 03/09/2017  . Myocardial infarction ([place]) 2005  . Papillary thyroid carcinoma ([place])   . Posterior vitreous detachment, right eye 04/07/2017  . Thyroid disease    No past surgical history on file. Social History   Tobacco Use  . Smoking status: Never Smoker  . Smokeless tobacco: Never Used  Substance Use Topics  . Alcohol use: No   family history is not on file.  ROS: negative except as noted in the HPI  Medications: Current Outpatient Medications  Medication Sig Dispense Refill  . AMBULATORY NON FORMULARY MEDICATION Knee-high, medium compression, graduated compression stockings. Apply to lower extremities. [email or web address], Zippered Compression Stockings, medium circ, long length 1 each 0  . ammonium lactate (LAC-HYDRIN) 12 %  lotion APP TOPICALLY PRF DRY SKIN  0  . aspirin 81 MG chewable tablet Chew by mouth.    . Blood Glucose Monitoring Suppl (ONE TOUCH ULTRA MINI) w/Device KIT CHECK FASTING BLOOD SUGAR DAILY AND 2 HOURS AFTER LARGEST MEAL OF THE DAY 100 each 1  . Calcium Carbonate-Vitamin D 600-400 MG-UNIT tablet Take 1 tablet by mouth 2 (two) times daily. 60 tablet 11  . diclofenac sodium (VOLTAREN) 1 % GEL Apply 4 g topically 4 (four) times daily. To affected joint. 100 g 1  . EPINEPHrine 0.3 mg/0.3 mL IJ SOAJ injection Inject 1 each as directed as needed. 1 Device 1  . esomeprazole (NEXIUM) 20 MG capsule Take 1 capsule (20 mg total) by mouth daily. 90 capsule 1  . Lancet Devices (ONE TOUCH DELICA LANCING DEV) MISC Check fasting blood sugar daily and 2 hours after largest meal of the day. Dx DM. 100 each prn  . Lancets (ONETOUCH ULTRASOFT) lancets Check fasting blood sugar daily and 2 hours after largest meal of the day. Dx DM. 100 each prn  . levothyroxine (SYNTHROID, LEVOTHROID) 100 MCG tablet Take 100 mcg by mouth daily.  1  . lisinopril (PRINIVIL,ZESTRIL) 5 MG tablet Take 1 tablet (5 mg total) by mouth daily. 90 tablet 0  . metFORMIN (GLUCOPHAGE) 1000  MG tablet TAKE 1 TABLET BY MOUTH EVERY MORNING 90 tablet 2  . metoprolol succinate (TOPROL-XL) 50 MG 24 hr tablet TAKE 1 TABLET BY MOUTH EVERY DAY FOR HIGH BLOOD PRESSURE 90 tablet 0  . ONE TOUCH ULTRA TEST test strip CHECK BLOOD SUGAR EVERY DAY AND 2 HOURS AFTER THE LARGEST MEAL OF THE DAY 100 each 0  . rosuvastatin (CRESTOR) 20 MG tablet Take 20 mg by mouth.  5   No current facility-administered medications for this visit.    Allergies  Allergen Reactions  . Bee Venom Swelling  . Tape Rash       Objective:  BP (!) 154/95   Pulse 61   Temp 98.1 F (36.7 C) (Oral)   Wt 177 lb (80.3 kg)   SpO2 98%   BMI 31.35 kg/m  Gen:  alert, not ill-appearing, no distress, appropriate for age, obese female HEENT: head normocephalic without obvious abnormality,  conjunctiva and cornea clear, wearing glasses, trachea midline Breast: normal appearing bilaterally, no palpable masses, no nipple discharge Pulm: Normal work of breathing, normal phonation, clear to auscultation bilaterally CV: Normal rate, regular rhythm, s1 and s2 distinct, no murmurs, clicks or rubs  Neuro: alert and oriented x 3, no tremor MSK: chest wall tender from the upper third of the sternum across the left upper pectoralis and the anterior border of the axilla Lymph: no axillary adenopathy Skin: intact, no rashes on exposed skin Psych: well-groomed, cooperative, good eye contact, euthymic mood, affect mood-congruent, speech is articulate, and thought processes clear and goal-directed   No results found for this or any previous visit (from the past 72 hour(s)). No results found.    Assessment and Plan: 66 y.o. female with   1. Chest wall tenderness - exam reveals this is more chest wall pain than mastalgia - symptomatic management with ice, tylenol, and avoidance of activities that cause pain - active surveillance  2. Elevated blood pressure reading in office with diagnosis of hypertension BP Readings from Last 3 Encounters:  06/03/17 (!) 154/95  05/12/17 111/75  03/09/17 102/64  - BP's have never been elevated in the office. We recently discontinued her HCTZ due to consistently low normal readings.  - will increase Lisinopril to 10 mg daily. Cont Metoprolol - follow-up in 1 week for nurse visit   3. Hypertension goal BP (blood pressure) < 140/90 - lisinopril (PRINIVIL,ZESTRIL) 10 MG tablet; Take 1 tablet (10 mg total) daily by mouth.  Dispense: 30 tablet; Refill: 5   Patient education and anticipatory guidance given Patient agrees with treatment plan Follow-up in 1 week for nurse BP check or sooner as needed if symptoms worsen or fail to improve  Darlyne Russian PA-C

## 2017-06-03 NOTE — Patient Instructions (Addendum)
For your blood pressure: - Increase Lisinopril to 10 mg daily (2 tabs) - Start baby aspirin 81 mg to help prevent heart attack/stroke - Check blood pressure at home for the next 2 weeks - Check around the same time each day in a relaxed setting - Limit salt to <2000 mg/day - Follow DASH eating plan - limit alcohol to 1 standard drink per day - avoid tobacco products - weight loss: 7% of current body weight - Follow-up in 1  weeks   Chest Wall Pain Chest wall pain is pain in or around the bones and muscles of your chest. Sometimes, an injury causes this pain. Sometimes, the cause may not be known. This pain may take several weeks or longer to get better. Follow these instructions at home: Pay attention to any changes in your symptoms. Take these actions to help with your pain:  Rest as told by your health care provider.  Avoid activities that cause pain. These include any activities that use your chest muscles or your abdominal and side muscles to lift heavy items.  If directed, apply ice to the painful area: ? Put ice in a plastic bag. ? Place a towel between your skin and the bag. ? Leave the ice on for 20 minutes, 2-3 times per day.  Take over-the-counter and prescription medicines only as told by your health care provider.  Do not use tobacco products, including cigarettes, chewing tobacco, and e-cigarettes. If you need help quitting, ask your health care provider.  Keep all follow-up visits as told by your health care provider. This is important.  Contact a health care provider if:  You have a fever.  Your chest pain becomes worse.  You have new symptoms. Get help right away if:  You have nausea or vomiting.  You feel sweaty or light-headed.  You have a cough with phlegm (sputum) or you cough up blood.  You develop shortness of breath. This information is not intended to replace advice given to you by your health care provider. Make sure you discuss any questions  you have with your health care provider. Document Released: 07/12/2005 Document Revised: 11/20/2015 Document Reviewed: 10/07/2014 Elsevier Interactive Patient Education  2017 [employer].

## 2017-06-10 ENCOUNTER — Ambulatory Visit (INDEPENDENT_AMBULATORY_CARE_PROVIDER_SITE_OTHER): Payer: Medicare Other | Admitting: Physician Assistant

## 2017-06-10 ENCOUNTER — Encounter: Payer: Self-pay | Admitting: Physician Assistant

## 2017-06-10 ENCOUNTER — Ambulatory Visit (INDEPENDENT_AMBULATORY_CARE_PROVIDER_SITE_OTHER): Payer: Medicare Other

## 2017-06-10 VITALS — BP 145/89 | HR 66 | Temp 98.5°F | Wt 176.0 lb

## 2017-06-10 DIAGNOSIS — I700 Atherosclerosis of aorta: Secondary | ICD-10-CM

## 2017-06-10 DIAGNOSIS — R0789 Other chest pain: Secondary | ICD-10-CM | POA: Diagnosis not present

## 2017-06-10 DIAGNOSIS — I10 Essential (primary) hypertension: Secondary | ICD-10-CM | POA: Diagnosis not present

## 2017-06-10 DIAGNOSIS — E785 Hyperlipidemia, unspecified: Secondary | ICD-10-CM | POA: Diagnosis not present

## 2017-06-10 DIAGNOSIS — E1169 Type 2 diabetes mellitus with other specified complication: Secondary | ICD-10-CM | POA: Diagnosis not present

## 2017-06-10 DIAGNOSIS — E890 Postprocedural hypothyroidism: Secondary | ICD-10-CM | POA: Diagnosis not present

## 2017-06-10 DIAGNOSIS — I517 Cardiomegaly: Secondary | ICD-10-CM

## 2017-06-10 HISTORY — DX: Cardiomegaly: I51.7

## 2017-06-10 HISTORY — DX: Atherosclerosis of aorta: I70.0

## 2017-06-10 LAB — TROPONIN I: Troponin I: 0.01 ng/mL (ref <=0.0)

## 2017-06-10 MED ORDER — NITROGLYCERIN 0.4 MG SL SUBL: 0.4000 mg | SUBLINGUAL_TABLET | SUBLINGUAL | 1 refills | Status: DC | PRN

## 2017-06-10 NOTE — Progress Notes (Signed)
HPI:                                                                Sara Gardner is a 66 y.o. female who presents to [place]: [place] today for hypertension follow-up  HTN: taking Lisinopril 53m daily. Compliant with medications. Checks BP's at home. BP range 156-174/76-86. Denies vision change, headache, orthopnea, lightheadedness, syncope and edema. Risk factors include: CAD, DMII, age>55, obesity  She continues to endorse left-sided chest wall pain. Pain is aching in quality, mild, and tender to palpation. She denies exertional chest pain, shortness of breath, palpitations, nausea/vomiting. She has not tried Nitroglycerin. She has a history of atypical chest pain that was evaluated by Cardiology in 2017. Negative stress test August 2017. History significant for AMI in 2005.  Past Medical History:  Diagnosis Date  . Cancer ([place]   . 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 Dermatochalasis of both eyelids 04/07/2017  . Diabetes mellitus without complication ([facility]   . Diastolic heart failure, NYHA class 1 ([facility]   . GERD (gastroesophageal reflux disease)   . Hypertension   . Lumbar degenerative disc disease 03/09/2017  . Myocardial infarction ([facility] 2005  . Papillary thyroid carcinoma ([facility]   . Posterior vitreous detachment, right eye 04/07/2017  . Thyroid disease    No past surgical history on file. Social History   Tobacco Use  . Smoking status: Never Smoker  . Smokeless tobacco: Never Used  Substance Use Topics  . Alcohol use: No   family history is not on file.  ROS: negative except as noted in the HPI  Medications: Current Outpatient Medications  Medication Sig Dispense Refill  . AMBULATORY NON FORMULARY MEDICATION Knee-high, medium compression, graduated compression stockings. Apply to lower extremities. [email or web address], Zippered Compression Stockings, medium circ, long length 1 each 0  .  ammonium lactate (LAC-HYDRIN) 12 % lotion APP TOPICALLY PRF DRY SKIN  0  . aspirin 81 MG chewable tablet Chew by mouth.    . Blood Glucose Monitoring Suppl (ONE TOUCH ULTRA MINI) w/Device KIT CHECK FASTING BLOOD SUGAR DAILY AND 2 HOURS AFTER LARGEST MEAL OF THE DAY 100 each 1  . Calcium Carbonate-Vitamin D 600-400 MG-UNIT tablet Take 1 tablet by mouth 2 (two) times daily. 60 tablet 11  . diclofenac sodium (VOLTAREN) 1 % GEL Apply 4 g topically 4 (four) times daily. To affected joint. 100 g 1  . EPINEPHrine 0.3 mg/0.3 mL IJ SOAJ injection Inject 1 each as directed as needed. 1 Device 1  . esomeprazole (NEXIUM) 20 MG capsule Take 1 capsule (20 mg total) by mouth daily. 90 capsule 1  . Lancet Devices (ONE TOUCH DELICA LANCING DEV) MISC Check fasting blood sugar daily and 2 hours after largest meal of the day. Dx DM. 100 each prn  . Lancets (ONETOUCH ULTRASOFT) lancets Check fasting blood sugar daily and 2 hours after largest meal of the day. Dx DM. 100 each prn  . levothyroxine (SYNTHROID, LEVOTHROID) 100 MCG tablet Take 100 mcg by mouth daily.  1  . metFORMIN (GLUCOPHAGE) 1000 MG tablet TAKE 1 TABLET BY MOUTH EVERY MORNING 90 tablet 2  . metoprolol succinate (TOPROL-XL) 50 MG 24 hr tablet TAKE 1 TABLET BY MOUTH EVERY DAY  FOR HIGH BLOOD PRESSURE 90 tablet 0  . ONE TOUCH ULTRA TEST test strip CHECK BLOOD SUGAR EVERY DAY AND 2 HOURS AFTER THE LARGEST MEAL OF THE DAY 100 each 0  . rosuvastatin (CRESTOR) 20 MG tablet Take 20 mg by mouth.  5   No current facility-administered medications for this visit.    Allergies  Allergen Reactions  . Bee Venom Swelling  . Tape Rash       Objective:  BP (!) 145/89   Pulse 66   Temp 98.5 F (36.9 C) (Oral)   Wt 176 lb (79.8 kg)   SpO2 96%   BMI 31.18 kg/m  Gen:  alert, not ill-appearing, no distress, appropriate for age, obese female HEENT: head normocephalic without obvious abnormality, conjunctiva and cornea clear, trachea midline Pulm: Normal work  of breathing, normal phonation, clear to auscultation bilaterally, no wheezes, rales or rhonchi CV: Normal rate, regular rhythm, s1 and s2 distinct, no murmurs, clicks or rubs  Neuro: alert and oriented x 3, no tremor MSK: there is chest wall tenderness from the upper third of the sternum across the left upper pectoralis and the anterior border of the axilla, extremities atraumatic, normal gait and station, no peripheral edema Skin: intact, no rashes on exposed skin, no jaundice, no cyanosis Psych: well-groomed, cooperative, good eye contact, euthymic mood, affect mood-congruent, speech is articulate, and thought processes clear and goal-directed  Depression screen [facility] 2/9 05/12/2017  Decreased Interest 0  Down, Depressed, Hopeless 0  PHQ - 2 Score 0     No results found for this or any previous visit (from the past 72 hour(s)). No results found.  ECG 06/10/2017 Vent rate 67 bpm PR-I 148 ms QRS 80 ms QT/QTc 406/429 Normal sinus rhythm with sinus arrhythmia  Assessment and Plan: 66 y.o. female with   1. Anterior chest wall pain - DG Chest 2 View; Future  2. Atypical chest pain - reproducible, non-exertional, persistent chest pain. ECG NSR, negative for ST depressions or elevations today. Negative stress test in 02/2016. This is all reassuring - we will complete outpatient cardiac work-up today and she will follow-up with her cardiologist  - she has not tried Nitro because her prescription is expired. New prescription for Nitro SL given today - discussed red flag symptoms warranting emergent care - DG Chest 2 View; Future - Troponin I  3. Essential hypertension BP Readings from Last 3 Encounters:  06/10/17 (!) 145/89  06/03/17 (!) 154/95  05/12/17 111/75  - BP out of range in office and at home. Re-starting Lisinopril-HCTZ 10-12.5 mg - therapeutic lifestyle changes - CBC with Differential/Platelet - Comprehensive metabolic panel  4. Hypothyroidism, postsurgical -  TSH  5. DM type 2 with diabetic dyslipidemia (HCC) - Lipid Panel w/reflex Direct LDL - Hemoglobin A1c   Patient education and anticipatory guidance given Patient agrees with treatment plan Follow-up in 2 weeks for nurse BP check or sooner as needed if symptoms worsen or fail to improve  Darlyne Russian PA-C

## 2017-06-10 NOTE — Progress Notes (Signed)
Troponin is negative. Chest x-ray does not show any acute abnormality. This is not a heart attack Chest x-ray shows stable enlarged heart and plaque in the aorta, which is consistent with her known coronary artery disease and high blood pressure I rechecked lipids today and we will adjust her Crestor if needed Recommend she continue to follow-up with her Cardiologist yearly (due in July 2019) or sooner if needed

## 2017-06-10 NOTE — Patient Instructions (Addendum)
- Labs and chest x-ray today - See if chest pain responds to Nitroglycerin - To the emergency room for severe chest pain, shortness of breath, nausea/vomiting, or new worsening/symptoms  For your blood pressure: - Start your Lisinopril-HCTZ - Limit salt to <1500 mg/day - Follow DASH eating plan - limit alcohol to 1 standard drinks per day - avoid tobacco products - weight loss: 7% of current body weight - Follow-up in 2 weeks    Nonspecific Chest Pain Chest pain can be caused by many different conditions. There is always a chance that your pain could be related to something serious, such as a heart attack or a blood clot in your lungs. Chest pain can also be caused by conditions that are not life-threatening. If you have chest pain, it is very important to follow up with your health care provider. What are the causes? Causes of this condition include:  Heartburn.  Pneumonia or bronchitis.  Anxiety or stress.  Inflammation around your heart (pericarditis) or lung (pleuritis or pleurisy).  A blood clot in your lung.  A collapsed lung (pneumothorax). This can develop suddenly on its own (spontaneous pneumothorax) or from trauma to the chest.  Shingles infection (varicella-zoster virus).  Heart attack.  Damage to the bones, muscles, and cartilage that make up your chest wall. This can include: ? Bruised bones due to injury. ? Strained muscles or cartilage due to frequent or repeated coughing or overwork. ? Fracture to one or more ribs. ? Sore cartilage due to inflammation (costochondritis).  What increases the risk? Risk factors for this condition may include:  Activities that increase your risk for trauma or injury to your chest.  Respiratory infections or conditions that cause frequent coughing.  Medical conditions or overeating that can cause heartburn.  Heart disease or family history of heart disease.  Conditions or health behaviors that increase your risk of  developing a blood clot.  Having had chicken pox (varicella zoster).  What are the signs or symptoms? Chest pain can feel like:  Burning or tingling on the surface of your chest or deep in your chest.  Crushing, pressure, aching, or squeezing pain.  Dull or sharp pain that is worse when you move, cough, or take a deep breath.  Pain that is also felt in your back, neck, shoulder, or arm, or pain that spreads to any of these areas.  Your chest pain may come and go, or it may stay constant. How is this diagnosed? Lab tests or other studies may be needed to find the cause of your pain. Your health care provider may have you take a test called an ECG (electrocardiogram). An ECG records your heartbeat patterns at the time the test is performed. You may also have other tests, such as:  Transthoracic echocardiogram (TTE). In this test, sound waves are used to create a picture of the heart structures and to look at how blood flows through your heart.  Transesophageal echocardiogram (TEE).This is a more advanced imaging test that takes images from inside your body. It allows your health care provider to see your heart in finer detail.  Cardiac monitoring. This allows your health care provider to monitor your heart rate and rhythm in real time.  Holter monitor. This is a portable device that records your heartbeat and can help to diagnose abnormal heartbeats. It allows your health care provider to track your heart activity for several days, if needed.  Stress tests. These can be done through exercise or by taking  medicine that makes your heart beat more quickly.  Blood tests.  Other imaging tests.  How is this treated? Treatment depends on what is causing your chest pain. Treatment may include:  Medicines. These may include: ? Acid blockers for heartburn. ? Anti-inflammatory medicine. ? Pain medicine for inflammatory conditions. ? Antibiotic medicine, if an infection is  present. ? Medicines to dissolve blood clots. ? Medicines to treat coronary artery disease (CAD).  Supportive care for conditions that do not require medicines. This may include: ? Resting. ? Applying heat or cold packs to injured areas. ? Limiting activities until pain decreases.  Follow these instructions at home: Medicines  If you were prescribed an antibiotic, take it as told by your health care provider. Do not stop taking the antibiotic even if you start to feel better.  Take over-the-counter and prescription medicines only as told by your health care provider. Lifestyle  Do not use any products that contain nicotine or tobacco, such as cigarettes and e-cigarettes. If you need help quitting, ask your health care provider.  Do not drink alcohol.  Make lifestyle changes as directed by your health care provider. These may include: ? Getting regular exercise. Ask your health care provider to suggest some activities that are safe for you. ? Eating a heart-healthy diet. A registered dietitian can help you to learn healthy eating options. ? Maintaining a healthy weight. ? Managing diabetes, if necessary. ? Reducing stress, such as with yoga or relaxation techniques. General instructions  Avoid any activities that bring on chest pain.  If heartburn is the cause for your chest pain, raise (elevate) the head of your bed about 6 inches (15 cm) by putting blocks under the legs. Sleeping with more pillows does not effectively relieve heartburn because it only changes the position of your head.  Keep all follow-up visits as told by your health care provider. This is important. This includes any further testing if your chest pain does not go away. Contact a health care provider if:  Your chest pain does not go away.  You have a rash with blisters on your chest.  You have a fever.  You have chills. Get help right away if:  Your chest pain is worse.  You have a cough that gets  worse, or you cough up blood.  You have severe pain in your abdomen.  You have severe weakness.  You faint.  You have sudden, unexplained chest discomfort.  You have sudden, unexplained discomfort in your arms, back, neck, or jaw.  You have shortness of breath at any time.  You suddenly start to sweat, or your skin gets clammy.  You feel nauseous or you vomit.  You suddenly feel light-headed or dizzy.  Your heart begins to beat quickly, or it feels like it is skipping beats. These symptoms may represent a serious problem that is an emergency. Do not wait to see if the symptoms will go away. Get medical help right away. Call your local emergency services (911 in the [place] Do not drive yourself to the hospital. This information is not intended to replace advice given to you by your health care provider. Make sure you discuss any questions you have with your health care provider. Document Released: 04/21/2005 Document Revised: 04/05/2016 Document Reviewed: 04/05/2016 Elsevier Interactive Patient Education  2017 [employer].

## 2017-06-11 ENCOUNTER — Other Ambulatory Visit: Payer: Self-pay | Admitting: Physician Assistant

## 2017-06-11 DIAGNOSIS — K219 Gastro-esophageal reflux disease without esophagitis: Secondary | ICD-10-CM

## 2017-06-11 LAB — COMPREHENSIVE METABOLIC PANEL
AG Ratio: 1.5 (calc) (ref 1.0–2.5)
ALKALINE PHOSPHATASE (APISO): 67 U/L (ref 33–130)
ALT: 18 U/L (ref 6–29)
AST: 18 U/L (ref 10–35)
Albumin: 3.9 g/dL (ref 3.6–5.1)
BUN: 13 mg/dL (ref 7–25)
CO2: 32 mmol/L (ref 20–32)
CREATININE: 0.88 mg/dL (ref 0.50–0.99)
Calcium: 9.2 mg/dL (ref 8.6–10.4)
Chloride: 105 mmol/L (ref 98–110)
Globulin: 2.6 g/dL (calc) (ref 1.9–3.7)
Glucose, Bld: 86 mg/dL (ref 65–99)
Potassium: 4.3 mmol/L (ref 3.5–5.3)
Sodium: 141 mmol/L (ref 135–146)
Total Bilirubin: 0.4 mg/dL (ref 0.2–1.2)
Total Protein: 6.5 g/dL (ref 6.1–8.1)

## 2017-06-11 LAB — LIPID PANEL W/REFLEX DIRECT LDL
Cholesterol: 187 mg/dL (ref <200)
HDL: 99 mg/dL (ref >50)
LDL Cholesterol (Calc): 75 mg/dL (calc)
Non-HDL Cholesterol (Calc): 88 mg/dL (calc) (ref <130)
Total CHOL/HDL Ratio: 1.9 (calc) (ref <5.0)
Triglycerides: 44 mg/dL (ref <150)

## 2017-06-11 LAB — CBC WITH DIFFERENTIAL/PLATELET
Basophils Absolute: 40 cells/uL (ref 0–200)
Basophils Relative: 0.8 %
EOS ABS: 230 {cells}/uL (ref 15–500)
Eosinophils Relative: 4.6 %
HCT: 38.2 % (ref 35.0–45.0)
Hemoglobin: 12.3 g/dL (ref 11.7–15.5)
Lymphs Abs: 2215 cells/uL (ref 850–3900)
MCH: 25.6 pg — ABNORMAL LOW (ref 27.0–33.0)
MCHC: 32.2 g/dL (ref 32.0–36.0)
MCV: 79.4 fL — AB (ref 80.0–100.0)
MPV: 10.6 fL (ref 7.5–12.5)
Monocytes Relative: 8.2 %
NEUTROS PCT: 42.1 %
Neutro Abs: 2105 cells/uL (ref 1500–7800)
PLATELETS: 269 10*3/uL (ref 140–400)
RBC: 4.81 10*6/uL (ref 3.80–5.10)
RDW: 15.9 % — AB (ref 11.0–15.0)
TOTAL LYMPHOCYTE: 44.3 %
WBC: 5 10*3/uL (ref 3.8–10.8)
WBCMIX: 410 {cells}/uL (ref 200–950)

## 2017-06-11 LAB — HEMOGLOBIN A1C
EAG (MMOL/L): 8.2 (calc)
HEMOGLOBIN A1C: 6.8 %{Hb} — AB (ref <5.7)
MEAN PLASMA GLUCOSE: 148 (calc)

## 2017-06-11 LAB — TSH: TSH: 1.46 mIU/L (ref 0.40–4.50)

## 2017-06-13 NOTE — Progress Notes (Signed)
Labs look great A1C is at goal  Thyroid function is good Cholesterol in a healthy range

## 2017-06-21 NOTE — Addendum Note (Signed)
Addended by: Huel Cote on: 06/21/2017 04:43 PM   Modules accepted: Orders

## 2017-06-24 ENCOUNTER — Ambulatory Visit (INDEPENDENT_AMBULATORY_CARE_PROVIDER_SITE_OTHER): Payer: Medicare Other | Admitting: Sports Medicine

## 2017-06-24 ENCOUNTER — Other Ambulatory Visit: Payer: Self-pay | Admitting: Sports Medicine

## 2017-06-24 ENCOUNTER — Other Ambulatory Visit: Payer: Self-pay

## 2017-06-24 VITALS — BP 125/70 | HR 68

## 2017-06-24 DIAGNOSIS — I10 Essential (primary) hypertension: Secondary | ICD-10-CM

## 2017-06-24 MED ORDER — ROSUVASTATIN CALCIUM 20 MG PO TABS: 20.0000 mg | ORAL_TABLET | Freq: Every day | ORAL | 3 refills | Status: DC

## 2017-06-24 MED ORDER — LISINOPRIL-HYDROCHLOROTHIAZIDE 20-12.5 MG PO TABS: 1.0000 | ORAL_TABLET | Freq: Every day | ORAL | 1 refills | Status: DC

## 2017-06-24 NOTE — Progress Notes (Signed)
Pt came into clinic today for BP check. Pt restarted Lisinopril-HCTZ 10-12.5 mg, reports no negative side effects. Pt's BP in office today was at goal, she also brought a list of home readings for Provider to review. Pt does state she needs a refill on her Crestor Rx. She is completely out. It was mentioned the Rx may be increased based on last labs, will route. No further questions/concerns.

## 2017-06-24 NOTE — Progress Notes (Signed)
Rx sent per Thekkekandam review under PCP's name based on most recent labs.

## 2017-06-24 NOTE — Assessment & Plan Note (Addendum)
Blood pressures look for the most part okay but there were a few borderline readings on lisinopril/HCTZ 10/12.5. Increasing to 20/12.5, return in 2 weeks for blood pressure checks.

## 2017-07-04 ENCOUNTER — Ambulatory Visit: Payer: Medicare Other | Admitting: Physician Assistant

## 2017-07-05 ENCOUNTER — Ambulatory Visit (INDEPENDENT_AMBULATORY_CARE_PROVIDER_SITE_OTHER): Payer: Medicare Other | Admitting: Physician Assistant

## 2017-07-05 ENCOUNTER — Encounter: Payer: Self-pay | Admitting: Physician Assistant

## 2017-07-05 VITALS — BP 112/68 | HR 67 | Temp 97.7°F | Wt 180.0 lb

## 2017-07-05 DIAGNOSIS — N952 Postmenopausal atrophic vaginitis: Secondary | ICD-10-CM

## 2017-07-05 DIAGNOSIS — Z1231 Encounter for screening mammogram for malignant neoplasm of breast: Secondary | ICD-10-CM | POA: Diagnosis not present

## 2017-07-05 DIAGNOSIS — I10 Essential (primary) hypertension: Secondary | ICD-10-CM | POA: Diagnosis not present

## 2017-07-05 DIAGNOSIS — R300 Dysuria: Secondary | ICD-10-CM

## 2017-07-05 LAB — POCT URINALYSIS DIPSTICK
BILIRUBIN UA: NEGATIVE
Glucose, UA: NEGATIVE
Ketones, UA: NEGATIVE
LEUKOCYTES UA: NEGATIVE
NITRITE UA: NEGATIVE
PH UA: 5.5 (ref 5.0–8.0)
PROTEIN UA: NEGATIVE
RBC UA: NEGATIVE
Spec Grav, UA: 1.01 (ref 1.010–1.025)
UROBILINOGEN UA: 0.2 U/dL

## 2017-07-05 MED ORDER — ESTROGENS, CONJUGATED 0.625 MG/GM VA CREA: TOPICAL_CREAM | VAGINAL | 0 refills | Status: DC

## 2017-07-05 NOTE — Patient Instructions (Signed)
Insert 1 applicator intravaginally daily for 1 week Then 1 applicator twice a week Follow-up in 6 weeks If no improvement, will refer to urology/gynecology

## 2017-07-05 NOTE — Progress Notes (Signed)
HPI:                                                                Sara Gardner is a 66 y.o. female who presents to [place]: [place] today for hypertension follow-up  HTN: Lisinopril-HCTZ was increased 2 weeks ago.  Compliant with medications. No outside BP's to report. Denies vision change, headache, chest pain with exertion, orthopnea, lightheadedness, syncope and edema. Patient has known CAD and history of MI.  Dysuria: reports 10 days of dysuria and foul-smelling urine. This has been a recurrent problem for her. She denies vaginal discharge or discomfort. She is not sexually active. Denies fever, chills, urinary frequency/urgency, hematuria, nausea, abdominal/flank pain.  Past Medical History:  Diagnosis Date  . Aortic atherosclerosis ([place]) 06/10/2017  . CAD (coronary artery disease)   . Cancer ([place])   . Cardiomegaly 06/10/2017  . 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 Dermatochalasis of both eyelids 04/07/2017  . Diabetes mellitus without complication ([place])   . Diastolic heart failure, NYHA class 1 ([place])   . GERD (gastroesophageal reflux disease)   . Hypertension   . Lumbar degenerative disc disease 03/09/2017  . Myocardial infarction ([place]) 2005  . Papillary thyroid carcinoma ([place])   . Posterior vitreous detachment, right eye 04/07/2017  . Thyroid disease    No past surgical history on file. Social History   Tobacco Use  . Smoking status: Never Smoker  . Smokeless tobacco: Never Used  Substance Use Topics  . Alcohol use: No   family history is not on file.  ROS: negative except as noted in the HPI  Medications: Current Outpatient Medications  Medication Sig Dispense Refill  . AMBULATORY NON FORMULARY MEDICATION Knee-high, medium compression, graduated compression stockings. Apply to lower extremities. [email or web address], Zippered Compression Stockings, medium circ, long length 1 each 0  .  ammonium lactate (LAC-HYDRIN) 12 % lotion APP TOPICALLY PRF DRY SKIN  0  . aspirin 81 MG chewable tablet Chew by mouth.    . Blood Glucose Monitoring Suppl (ONE TOUCH ULTRA MINI) w/Device KIT CHECK FASTING BLOOD SUGAR DAILY AND 2 HOURS AFTER LARGEST MEAL OF THE DAY 100 each 1  . Calcium Carbonate-Vitamin D 600-400 MG-UNIT tablet Take 1 tablet by mouth 2 (two) times daily. 60 tablet 11  . diclofenac sodium (VOLTAREN) 1 % GEL Apply 4 g topically 4 (four) times daily. To affected joint. 100 g 1  . EPINEPHrine 0.3 mg/0.3 mL IJ SOAJ injection Inject 1 each as directed as needed. 1 Device 1  . esomeprazole (NEXIUM) 20 MG capsule TAKE 1 CAPSULE(20 MG) BY MOUTH DAILY 90 capsule 0  . Lancet Devices (ONE TOUCH DELICA LANCING DEV) MISC Check fasting blood sugar daily and 2 hours after largest meal of the day. Dx DM. 100 each prn  . Lancets (ONETOUCH ULTRASOFT) lancets Check fasting blood sugar daily and 2 hours after largest meal of the day. Dx DM. 100 each prn  . levothyroxine (SYNTHROID, LEVOTHROID) 100 MCG tablet Take 100 mcg by mouth daily.  1  . lisinopril-hydrochlorothiazide (PRINZIDE,ZESTORETIC) 20-12.5 MG tablet TAKE 1 TABLET BY MOUTH DAILY 90 tablet 1  . metFORMIN (GLUCOPHAGE) 1000 MG tablet TAKE 1 TABLET BY MOUTH EVERY MORNING 90 tablet 2  .  metoprolol succinate (TOPROL-XL) 50 MG 24 hr tablet TAKE 1 TABLET BY MOUTH EVERY DAY FOR HIGH BLOOD PRESSURE 90 tablet 0  . nitroGLYCERIN (NITROSTAT) 0.4 MG SL tablet Place 1 tablet (0.4 mg total) every 5 (five) minutes as needed under the tongue for chest pain. Max dose 3 tablets in 15 minutes 20 tablet 1  . ONE TOUCH ULTRA TEST test strip CHECK BLOOD SUGAR EVERY DAY AND 2 HOURS AFTER THE LARGEST MEAL OF THE DAY 100 each 0  . rosuvastatin (CRESTOR) 20 MG tablet Take 1 tablet (20 mg total) by mouth daily. 90 tablet 3  . conjugated estrogens (PREMARIN) vaginal cream Insert 0.5 g intravaginally daily for 1 week, then twice weekly 30 g 0   No current  facility-administered medications for this visit.    Allergies  Allergen Reactions  . Bee Venom Swelling  . Tape Rash    Objective:  BP 112/68   Pulse 67   Temp 97.7 F (36.5 C) (Oral)   Wt 180 lb (81.6 kg)   BMI 31.89 kg/m  Gen:  alert, not ill-appearing, no distress, appropriate for age, obese female HEENT: head normocephalic without obvious abnormality, conjunctiva and cornea clear, trachea midline Pulm: Normal work of breathing, normal phonation, clear to auscultation bilaterally, no wheezes, rales or rhonchi CV: Normal rate, regular rhythm, s1 and s2 distinct, no murmurs, clicks or rubs; no carotid bruit  Neuro: alert and oriented x 3, no tremor MSK: extremities atraumatic, normal gait and station, no peripheral edema Skin: intact, no rashes on exposed skin, no jaundice, no cyanosis Psych: well-groomed, cooperative, good eye contact, euthymic mood, affect mood-congruent, speech is articulate, and thought processes clear and goal-directed  Depression screen [facility] 2/9 05/12/2017  Decreased Interest 0  Down, Depressed, Hopeless 0  PHQ - 2 Score 0     Results for orders placed or performed in visit on 07/05/17 (from the past 72 hour(s))  Urinalysis Dipstick     Status: Normal   Collection Time: 07/05/17  2:39 PM  Result Value Ref Range   Color, UA yellow    Clarity, UA clear    Glucose, UA negative    Bilirubin, UA negative    Ketones, UA negative    Spec Grav, UA 1.010 1.010 - 1.025   Blood, UA negative    pH, UA 5.5 5.0 - 8.0   Protein, UA negative    Urobilinogen, UA 0.2 0.2 or 1.0 E.U./dL   Nitrite, UA negative    Leukocytes, UA Negative Negative   Appearance clear    Odor none   Urine Culture     Status: None   Collection Time: 07/05/17  4:00 PM  Result Value Ref Range   MICRO NUMBER: [id number]    SPECIMEN QUALITY: ADEQUATE    Sample Source URINE    STATUS: FINAL    ISOLATE 1:      Single organism less than 10,000 CFU/mL isolated. These organisms, commonly  found on external and internal genitalia, are considered colonizers. No further testing performed.   No results found.    Assessment and Plan: 66 y.o. female with   1. Dysuria - Urinalysis Dipstick negative - Urine Culture pending, prior urine cultures have been negative. We are going to trial intravaginal estrogen. If symptoms persist, will refer to urology.  2. Post-menopausal atrophic vaginitis - conjugated estrogens (PREMARIN) vaginal cream; Insert 0.5 g intravaginally daily for 1 week, then twice weekly  Dispense: 30 g; Refill: 0  3. Encounter for screening mammogram for  breast cancer - MM SCREENING BREAST TOMO BILATERAL; Future  4. Essential Hypertension BP Readings from Last 3 Encounters:  07/05/17 112/68  06/24/17 125/70  06/10/17 (!) 145/89  - BP well controlled - continue current medications and therapeutic lifestyle changes. Follow-up in 6 months  Patient education and anticipatory guidance given Patient agrees with treatment plan Follow-up in 6 weeks for dysuria or sooner as needed if symptoms worsen or fail to improve  Darlyne Russian PA-C

## 2017-07-06 ENCOUNTER — Ambulatory Visit: Payer: Medicare Other | Admitting: Physician Assistant

## 2017-07-06 LAB — URINE CULTURE
MICRO NUMBER: 81391931
SPECIMEN QUALITY:: ADEQUATE

## 2017-07-07 ENCOUNTER — Encounter: Payer: Self-pay | Admitting: Physician Assistant

## 2017-07-07 DIAGNOSIS — R300 Dysuria: Secondary | ICD-10-CM | POA: Insufficient documentation

## 2017-07-07 NOTE — Progress Notes (Signed)
Urine culture negative Continue premarin cream and follow-up in 6 weeks

## 2017-07-27 ENCOUNTER — Telehealth: Payer: Self-pay | Admitting: Physician Assistant

## 2017-07-27 ENCOUNTER — Encounter: Payer: Self-pay | Admitting: Physician Assistant

## 2017-07-27 DIAGNOSIS — M2042 Other hammer toe(s) (acquired), left foot: Secondary | ICD-10-CM

## 2017-07-27 DIAGNOSIS — M2041 Other hammer toe(s) (acquired), right foot: Secondary | ICD-10-CM | POA: Insufficient documentation

## 2017-07-27 DIAGNOSIS — M24573 Contracture, unspecified ankle: Secondary | ICD-10-CM | POA: Insufficient documentation

## 2017-07-27 DIAGNOSIS — M7732 Calcaneal spur, left foot: Secondary | ICD-10-CM

## 2017-07-27 DIAGNOSIS — B351 Tinea unguium: Secondary | ICD-10-CM | POA: Insufficient documentation

## 2017-07-27 DIAGNOSIS — M7731 Calcaneal spur, right foot: Secondary | ICD-10-CM | POA: Insufficient documentation

## 2017-07-27 NOTE — Telephone Encounter (Signed)
Currently Sara Gardner is still working on [name] paperwork. I've called and left message on machine stating we will try to get this filled out and returned to her ASAP. Evlyn Clines is aware

## 2017-07-27 NOTE — Telephone Encounter (Signed)
Pt called and stated she had dropped off a form for diabetic shoes before Christmas that Evlyn Clines had to feel out and has not heard if it has been completed. Has this form been completed and faxed or ready for pick up because there was nothing in the front folder. Thanks

## 2017-07-27 NOTE — Telephone Encounter (Signed)
Delay was due to form requiring signature of an MD Form completed and faxed to [facility] today Patient can also pick up a hardcopy

## 2017-07-28 NOTE — Telephone Encounter (Signed)
LMOM for patient to pick up paperwork at the office. Paperwork is ready and was faxed

## 2017-08-06 ENCOUNTER — Other Ambulatory Visit: Payer: Self-pay | Admitting: Physician Assistant

## 2017-08-06 DIAGNOSIS — I10 Essential (primary) hypertension: Secondary | ICD-10-CM

## 2017-08-15 ENCOUNTER — Ambulatory Visit: Payer: Medicare Other | Admitting: Physician Assistant

## 2017-08-17 ENCOUNTER — Ambulatory Visit (INDEPENDENT_AMBULATORY_CARE_PROVIDER_SITE_OTHER): Payer: Medicare Other | Admitting: Physician Assistant

## 2017-08-17 ENCOUNTER — Encounter: Payer: Self-pay | Admitting: Physician Assistant

## 2017-08-17 ENCOUNTER — Ambulatory Visit (INDEPENDENT_AMBULATORY_CARE_PROVIDER_SITE_OTHER): Payer: Medicare Other

## 2017-08-17 VITALS — BP 123/78 | HR 71 | Temp 97.9°F | Wt 177.0 lb

## 2017-08-17 DIAGNOSIS — Z1231 Encounter for screening mammogram for malignant neoplasm of breast: Secondary | ICD-10-CM

## 2017-08-17 DIAGNOSIS — R928 Other abnormal and inconclusive findings on diagnostic imaging of breast: Secondary | ICD-10-CM | POA: Diagnosis not present

## 2017-08-17 DIAGNOSIS — N952 Postmenopausal atrophic vaginitis: Secondary | ICD-10-CM

## 2017-08-17 DIAGNOSIS — R350 Frequency of micturition: Secondary | ICD-10-CM | POA: Insufficient documentation

## 2017-08-17 DIAGNOSIS — N3941 Urge incontinence: Secondary | ICD-10-CM | POA: Diagnosis not present

## 2017-08-17 DIAGNOSIS — M159 Polyosteoarthritis, unspecified: Secondary | ICD-10-CM

## 2017-08-17 DIAGNOSIS — N3281 Overactive bladder: Secondary | ICD-10-CM | POA: Diagnosis not present

## 2017-08-17 DIAGNOSIS — M150 Primary generalized (osteo)arthritis: Secondary | ICD-10-CM | POA: Diagnosis not present

## 2017-08-17 LAB — POCT URINALYSIS DIPSTICK
Bilirubin, UA: NEGATIVE
Blood, UA: NEGATIVE
GLUCOSE UA: NEGATIVE
Ketones, UA: NEGATIVE
LEUKOCYTES UA: NEGATIVE
NITRITE UA: NEGATIVE
PROTEIN UA: NEGATIVE
Spec Grav, UA: 1.01 (ref 1.010–1.025)
Urobilinogen, UA: 0.2 E.U./dL
pH, UA: 7 (ref 5.0–8.0)

## 2017-08-17 MED ORDER — MIRABEGRON ER 25 MG PO TB24: 25.0000 mg | ORAL_TABLET | Freq: Every day | ORAL | 5 refills | Status: DC

## 2017-08-17 MED ORDER — ESTROGENS, CONJUGATED 0.625 MG/GM VA CREA: TOPICAL_CREAM | VAGINAL | 5 refills | Status: DC

## 2017-08-17 MED ORDER — ACETAMINOPHEN ER 650 MG PO TBCR: 1300.0000 mg | EXTENDED_RELEASE_TABLET | Freq: Three times a day (TID) | ORAL | 3 refills | Status: DC | PRN

## 2017-08-17 NOTE — Progress Notes (Signed)
HPI:                                                                Sara Gardner is a 67 y.o. female who presents to [facility]: [place] today for urinary frequency  Urinary Frequency   This is a recurrent problem. The current episode started more than 1 month ago. The problem occurs every urination. The problem has been unchanged. The patient is experiencing no pain. There has been no fever. She is not sexually active. There is no history of pyelonephritis. Associated symptoms include frequency and urgency. Pertinent negatives include no chills, discharge or flank pain. Associated symptoms comments: + incontinence. She has tried nothing for the symptoms. There is no history of catheterization, recurrent UTIs or a urological procedure.  Reports urge incontinence, leaks approx 1 tbsp of urine about once per day. Urinary 9-10 times daily and wakes 1-2 times nightly to urinate. She is postmenopausal and was found to have atrophic changes on exam 3 months ago. She was prescribed Premarin and used it for a few weeks.    Depression screen PHQ 2/9 05/12/2017  Decreased Interest 0  Down, Depressed, Hopeless 0  PHQ - 2 Score 0    No flowsheet data found.    Past Medical History:  Diagnosis Date  . Aortic atherosclerosis ([place]) 06/10/2017  . CAD (coronary artery disease)   . Cancer ([place])   . Cardiomegaly 06/10/2017  . 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 Dermatochalasis of both eyelids 04/07/2017  . Diabetes mellitus without complication ([place])   . Diastolic heart failure, NYHA class 1 ([place])   . GERD (gastroesophageal reflux disease)   . Hypertension   . Lumbar degenerative disc disease 03/09/2017  . Myocardial infarction ([place]) 2005  . Papillary thyroid carcinoma ([place])   . Posterior vitreous detachment, right eye 04/07/2017  . Thyroid disease    No past surgical history on file. Social History   Tobacco Use  .  Smoking status: Never Smoker  . Smokeless tobacco: Never Used  Substance Use Topics  . Alcohol use: No   family history is not on file.    ROS: negative except as noted in the HPI  Medications: Current Outpatient Medications  Medication Sig Dispense Refill  . AMBULATORY NON FORMULARY MEDICATION Knee-high, medium compression, graduated compression stockings. Apply to lower extremities. [email or web address], Zippered Compression Stockings, medium circ, long length 1 each 0  . ammonium lactate (LAC-HYDRIN) 12 % lotion APP TOPICALLY PRF DRY SKIN  0  . aspirin 81 MG chewable tablet Chew by mouth.    . Blood Glucose Monitoring Suppl (ONE TOUCH ULTRA MINI) w/Device KIT CHECK FASTING BLOOD SUGAR DAILY AND 2 HOURS AFTER LARGEST MEAL OF THE DAY 100 each 1  . Calcium Carbonate-Vitamin D 600-400 MG-UNIT tablet Take 1 tablet by mouth 2 (two) times daily. 60 tablet 11  . conjugated estrogens (PREMARIN) vaginal cream Insert 0.5 g intravaginally daily for 1 week, then twice weekly 30 g 0  . diclofenac sodium (VOLTAREN) 1 % GEL Apply 4 g topically 4 (four) times daily. To affected joint. 100 g 1  . EPINEPHrine 0.3 mg/0.3 mL IJ SOAJ injection Inject 1 each as directed as needed. 1 Device 1  .  esomeprazole (NEXIUM) 20 MG capsule TAKE 1 CAPSULE(20 MG) BY MOUTH DAILY 90 capsule 0  . Lancet Devices (ONE TOUCH DELICA LANCING DEV) MISC Check fasting blood sugar daily and 2 hours after largest meal of the day. Dx DM. 100 each prn  . Lancets (ONETOUCH ULTRASOFT) lancets Check fasting blood sugar daily and 2 hours after largest meal of the day. Dx DM. 100 each prn  . levothyroxine (SYNTHROID, LEVOTHROID) 100 MCG tablet Take 100 mcg by mouth daily.  1  . lisinopril (PRINIVIL,ZESTRIL) 5 MG tablet TAKE 1 TABLET(5 MG) BY MOUTH DAILY 90 tablet 0  . lisinopril-hydrochlorothiazide (PRINZIDE,ZESTORETIC) 20-12.5 MG tablet TAKE 1 TABLET BY MOUTH DAILY 90 tablet 1  . metFORMIN (GLUCOPHAGE) 1000 MG tablet TAKE 1 TABLET BY  MOUTH EVERY MORNING 90 tablet 2  . metoprolol succinate (TOPROL-XL) 50 MG 24 hr tablet TAKE 1 TABLET BY MOUTH EVERY DAY FOR HIGH BLOOD PRESSURE 90 tablet 0  . nitroGLYCERIN (NITROSTAT) 0.4 MG SL tablet Place 1 tablet (0.4 mg total) every 5 (five) minutes as needed under the tongue for chest pain. Max dose 3 tablets in 15 minutes 20 tablet 1  . ONE TOUCH ULTRA TEST test strip CHECK BLOOD SUGAR EVERY DAY AND 2 HOURS AFTER THE LARGEST MEAL OF THE DAY 100 each 0  . rosuvastatin (CRESTOR) 20 MG tablet Take 1 tablet (20 mg total) by mouth daily. 90 tablet 3   No current facility-administered medications for this visit.    Allergies  Allergen Reactions  . Bee Venom Swelling  . Tape Rash       Objective:  BP 123/78   Pulse 71   Temp 97.9 F (36.6 C) (Oral)   Wt 177 lb (80.3 kg)   BMI 31.35 kg/m  Gen:  alert, not ill-appearing, no distress, appropriate for age HEENT: head normocephalic without obvious abnormality, conjunctiva and cornea clear, trachea midline Pulm: Normal work of breathing, normal phonation Neuro: alert and oriented x 3, no tremor MSK: extremities atraumatic, normal gait and station Skin: intact, no rashes on exposed skin, no jaundice, no cyanosis Psych: well-groomed, cooperative, good eye contact, euthymic mood, affect mood-congruent, speech is articulate, and thought processes clear and goal-directed    No results found for this or any previous visit (from the past 72 hour(s)). No results found.    Assessment and Plan: 67 y.o. female with   1. Urinary frequency - POCT Urinalysis Dipstick negative today; negative urine culture 1 month ago - symptoms are most consistent with OAB/urge incontinence - will trial Mybetriq. Also instructed to re-start her Premarin cream - mirabegron ER (MYRBETRIQ) 25 MG TB24 tablet; Take 1 tablet (25 mg total) by mouth daily.  Dispense: 30 tablet; Refill: 5   2. Overactive bladder - OAB SS 10, mild-moderate  - mirabegron ER  (MYRBETRIQ) 25 MG TB24 tablet; Take 1 tablet (25 mg total) by mouth daily.  Dispense: 30 tablet; Refill: 5  3. Post-menopausal atrophic vaginitis - conjugated estrogens (PREMARIN) vaginal cream; Insert 0.5 g intravaginally daily for 1 week, then twice weekly  Dispense: 30 g; Refill: 5  4. Urge incontinence   Patient education and anticipatory guidance given Patient agrees with treatment plan Follow-up in 2 months or sooner as needed if symptoms worsen or fail to improve  Darlyne Russian PA-C

## 2017-08-17 NOTE — Progress Notes (Signed)
Mammogram is still showing that abnormality in the right breast They want to get extra pictures Ultrasound and diagnostic mammo will be done at the breast center She should be contacted to schedule

## 2017-08-17 NOTE — Patient Instructions (Addendum)
Atrophic Vaginitis Atrophic vaginitis is when the tissues that line the vagina become dry and thin. This is caused by a drop in estrogen. Estrogen helps:  To keep the vagina moist.  To make a clear fluid that helps: ? To lubricate the vagina for sex. ? To protect the vagina from infection.  If the lining of the vagina is dry and thin, it may:  Make sex painful. It may also cause bleeding.  Cause a feeling of: ? Burning. ? Irritation. ? Itchiness.  Make an exam of your vagina painful. It may also cause bleeding.  Make you lose interest in sex.  Cause a burning feeling when you pee.  Make your vaginal fluid (discharge) brown or yellow.  For some women, there are no symptoms. This condition is most common in women who do not get their regular menstrual periods anymore (menopause). This often starts when a woman is 60-78 years old. Follow these instructions at home:  Take medicines only as told by your doctor. Do not use any herbal or alternative medicines unless your doctor says it is okay.  Use over-the-counter products for dryness only as told by your doctor. These include: ? Creams. ? Lubricants. ? Moisturizers.  Do not douche.  Do not use products that can make your vagina dry. These include: ? Scented feminine sprays. ? Scented tampons. ? Scented soaps.  If it hurts to have sex, tell your sexual partner. Contact a doctor if:  Your discharge looks different than normal.  Your vagina has an unusual smell.  You have new symptoms.  Your symptoms do not get better with treatment.  Your symptoms get worse. This information is not intended to replace advice given to you by your health care provider. Make sure you discuss any questions you have with your health care provider. Document Released: 12/29/2007 Document Revised: 12/18/2015 Document Reviewed: 07/03/2014 Elsevier Interactive Patient Education  2018 [employer].   Overactive Bladder, Adult Overactive  bladder is a group of urinary symptoms. With overactive bladder, you may suddenly feel the need to pass urine (urinate) right away. After feeling this sudden urge, you might also leak urine if you cannot get to the bathroom fast enough (urinary incontinence). These symptoms might interfere with your daily work or social activities. Overactive bladder symptoms may also wake you up at night. Overactive bladder affects the nerve signals between your bladder and your brain. Your bladder may get the signal to empty before it is full. Very sensitive muscles can also make your bladder squeeze too soon. What are the causes? Many things can cause an overactive bladder. Possible causes include:  Urinary tract infection.  Infection of nearby tissues, such as the prostate.  Prostate enlargement.  Being pregnant with twins or more (multiples).  Surgery on the uterus or urethra.  Bladder stones, inflammation, or tumors.  Drinking too much caffeine or alcohol.  Certain medicines, especially those that you take to help your body get rid of extra fluid (diuretics) by increasing urine production.  Muscle or nerve weakness, especially from: ? A spinal cord injury. ? Stroke. ? Multiple sclerosis. ? Parkinson disease.  Diabetes. This can cause a high urine volume that fills the bladder so quickly that the normal urge to urinate is triggered very strongly.  Constipation. A buildup of too much stool can put pressure on your bladder.  What increases the risk? You may be at greater risk for overactive bladder if you:  Are an older adult.  Smoke.  Are going  through menopause.  Have prostate problems.  Have a neurological disease, such as stroke, dementia, Parkinson disease, or multiple sclerosis (MS).  Eat or drink things that irritate the bladder. These include alcohol, spicy food, and caffeine.  Are overweight or obese.  What are the signs or symptoms? The signs and symptoms of an overactive  bladder include:  Sudden, strong urges to urinate.  Leaking urine.  Urinating eight or more times per day.  Waking up to urinate two or more times per night.  How is this diagnosed? Your health care provider may suspect overactive bladder based on your symptoms. The health care provider will do a physical exam and take your medical history. Blood or urine tests may also be done. For example, you might need to have a bladder function test to check how well you can hold your urine. You might also need to see a health care provider who specializes in the urinary tract (urologist). How is this treated? Treatment for overactive bladder depends on the cause of your condition and whether it is mild or severe. Certain treatments can be done in your health care provider's office or clinic. You can also make lifestyle changes at home. Options include: Behavioral Treatments  Biofeedback. A specialist uses sensors to help you become aware of your body's signals.  Keeping a daily log of when you need to urinate and what happens after the urge. This may help you manage your condition.  Bladder training. This helps you learn to control the urge to urinate by following a schedule that directs you to urinate at regular intervals (timed voiding). At first, you might have to wait a few minutes after feeling the urge. In time, you should be able to schedule bathroom visits an hour or more apart.  Kegel exercises. These are exercises to strengthen the pelvic floor muscles, which support the bladder. Toning these muscles can help you control urination, even if your bladder muscles are overactive. A specialist will teach you how to do these exercises correctly. They require daily practice.  Weight loss. If you are obese or overweight, losing weight might relieve your symptoms of overactive bladder. Talk to your health care provider about losing weight and whether there is a specific program or method that would  work best for you.  Diet change. This might help if constipation is making your overactive bladder worse. Your health care provider or a dietitian can explain ways to change what you eat to ease constipation. You might also need to consume less alcohol and caffeine or drink other fluids at different times of the day.  Stopping smoking.  Wearing pads to absorb leakage while you wait for other treatments to take effect. Physical Treatments  Electrical stimulation. Electrodes send gentle pulses of electricity to strengthen the nerves or muscles that help to control the bladder. Sometimes, the electrodes are placed outside of the body. In other cases, they might be placed inside the body (implanted). This treatment can take several months to have an effect.  Supportive devices. Women may need a plastic device that fits into the vagina and supports the bladder (pessary). Medicines Several medicines can help treat overactive bladder and are usually used along with other treatments. Some are injected into the muscles involved in urination. Others come in pill form. Your health care provider may prescribe:  Antispasmodics. These medicines block the signals that the nerves send to the bladder. This keeps the bladder from releasing urine at the wrong time.  Tricyclic antidepressants.  These types of antidepressants also relax bladder muscles.  Surgery  You may have a device implanted to help manage the nerve signals that indicate when you need to urinate.  You may have surgery to implant electrodes for electrical stimulation.  Sometimes, very severe cases of overactive bladder require surgery to change the shape of the bladder. Follow these instructions at home:  Take medicines only as directed by your health care provider.  Use any implants or a pessary as directed by your health care provider.  Make any diet or lifestyle changes that are recommended by your health care provider. These might  include: ? Drinking less fluid or drinking at different times of the day. If you need to urinate often during the night, you may need to stop drinking fluids early in the evening. ? Cutting down on caffeine or alcohol. Both can make an overactive bladder worse. Caffeine is found in coffee, tea, and sodas. ? Doing Kegel exercises to strengthen muscles. ? Losing weight if you need to. ? Eating a healthy and balanced diet to prevent constipation.  Keep a journal or log to track how much and when you drink and also when you feel the need to urinate. This will help your health care provider to monitor your condition. Contact a health care provider if:  Your symptoms do not get better after treatment.  Your pain and discomfort are getting worse.  You have more frequent urges to urinate.  You have a fever. Get help right away if: You are not able to control your bladder at all. This information is not intended to replace advice given to you by your health care provider. Make sure you discuss any questions you have with your health care provider. Document Released: 05/08/2009 Document Revised: 12/18/2015 Document Reviewed: 12/05/2013 Elsevier Interactive Patient Education  [employer].

## 2017-08-18 ENCOUNTER — Other Ambulatory Visit: Payer: Self-pay | Admitting: Physician Assistant

## 2017-08-18 DIAGNOSIS — R928 Other abnormal and inconclusive findings on diagnostic imaging of breast: Secondary | ICD-10-CM

## 2017-08-19 ENCOUNTER — Other Ambulatory Visit: Payer: Self-pay | Admitting: Physician Assistant

## 2017-08-19 DIAGNOSIS — E1169 Type 2 diabetes mellitus with other specified complication: Secondary | ICD-10-CM

## 2017-08-19 DIAGNOSIS — E785 Hyperlipidemia, unspecified: Principal | ICD-10-CM

## 2017-08-23 ENCOUNTER — Encounter: Payer: Self-pay | Admitting: Physician Assistant

## 2017-08-23 DIAGNOSIS — N3941 Urge incontinence: Secondary | ICD-10-CM | POA: Insufficient documentation

## 2017-08-23 DIAGNOSIS — M159 Polyosteoarthritis, unspecified: Secondary | ICD-10-CM | POA: Insufficient documentation

## 2017-08-23 DIAGNOSIS — M150 Primary generalized (osteo)arthritis: Secondary | ICD-10-CM

## 2017-08-24 ENCOUNTER — Ambulatory Visit
Admission: RE | Admit: 2017-08-24 | Discharge: 2017-08-24 | Disposition: A | Discharge status: Home / Self Care | Payer: Medicare Other | Source: Ambulatory Visit | Attending: Physician Assistant | Admitting: Physician Assistant

## 2017-08-24 DIAGNOSIS — R928 Other abnormal and inconclusive findings on diagnostic imaging of breast: Secondary | ICD-10-CM

## 2017-08-24 NOTE — Progress Notes (Signed)
Breast ultrasound did not show any mass or concerning findings Recommend repeat screening mammogram in 1 year

## 2017-08-25 ENCOUNTER — Ambulatory Visit (INDEPENDENT_AMBULATORY_CARE_PROVIDER_SITE_OTHER): Payer: Medicare Other | Admitting: Sports Medicine

## 2017-08-25 ENCOUNTER — Encounter: Payer: Self-pay | Admitting: Sports Medicine

## 2017-08-25 DIAGNOSIS — R350 Frequency of micturition: Secondary | ICD-10-CM | POA: Diagnosis not present

## 2017-08-25 DIAGNOSIS — M5136 Other intervertebral disc degeneration, lumbar region: Secondary | ICD-10-CM

## 2017-08-25 DIAGNOSIS — M51369 Other intervertebral disc degeneration, lumbar region without mention of lumbar back pain or lower extremity pain: Secondary | ICD-10-CM

## 2017-08-25 MED ORDER — MELOXICAM 15 MG PO TABS: ORAL_TABLET | ORAL | 3 refills | Status: DC

## 2017-08-25 MED ORDER — PREDNISONE 50 MG PO TABS: ORAL_TABLET | ORAL | 0 refills | Status: DC

## 2017-08-25 NOTE — Assessment & Plan Note (Signed)
I do suspect her symptoms are from overactive bladder, and atrophic vaginitis. She does desire that I check a single additional urinalysis and urine culture.

## 2017-08-25 NOTE — Assessment & Plan Note (Signed)
Symptoms consistent with lumbar spinal stenosis. We discussed the pathophysiology and anthropology of degenerative disc disease. Formal physical therapy, 5 days of prednisone, meloxicam. Return to see me in 6 weeks, MR for interventional planning if no better.

## 2017-08-25 NOTE — Progress Notes (Signed)
Subjective:    I'm seeing this patient as a consultation for: Nelson Chimes, PA-C  CC: Back pain  HPI: This is a pleasant 67 year old female, she had several months of pain that she localizes in the right side of her low back with radiation down the anterior and medial right thigh.  Worse with prolonged walking, standing upright.  No bowel or new bladder dysfunction, saddle numbness, constitutional symptoms.  She does have some overactive bladder, dysuria, urgency, frequency, currently being treated, she has had several negative urinalyses and urine cultures.  Currently on Premarin cream for atrophic vaginitis.  Symptoms are moderate, persistent.    I reviewed the past medical history, family history, social history, surgical history, and allergies today and no changes were needed.  Please see the problem list section below in epic for further details.  Past Medical History: Past Medical History:  Diagnosis Date  . Aortic atherosclerosis ([place]) 06/10/2017  . CAD (coronary artery disease)   . Cancer ([place])   . Cardiomegaly 06/10/2017  . 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 Dermatochalasis of both eyelids 04/07/2017  . Diabetes mellitus without complication ([place])   . Diastolic heart failure, NYHA class 1 ([place])   . GERD (gastroesophageal reflux disease)   . Hypertension   . Lumbar degenerative disc disease 03/09/2017  . Myocardial infarction ([place]) 2005  . Papillary thyroid carcinoma ([place])   . Posterior vitreous detachment, right eye 04/07/2017  . Thyroid disease    Past Surgical History: No past surgical history on file. Social History: Social History   Socioeconomic History  . Marital status: Unknown    Spouse name: None  . Number of children: None  . Years of education: None  . Highest education level: None  Social Needs  . Financial resource strain: None  . Food insecurity - worry: None  . Food insecurity - inability: None  . Transportation  needs - medical: None  . Transportation needs - non-medical: None  Occupational History  . None  Tobacco Use  . Smoking status: Never Smoker  . Smokeless tobacco: Never Used  Substance and Sexual Activity  . Alcohol use: No  . Drug use: No  . Sexual activity: Not Currently  Other Topics Concern  . None  Social History Narrative  . None   Family History: No family history on file. Allergies: Allergies  Allergen Reactions  . Bee Venom Swelling  . Tape Rash   Medications: See med rec.  Review of Systems: No headache, visual changes, nausea, vomiting, diarrhea, constipation, dizziness, abdominal pain, skin rash, fevers, chills, night sweats, weight loss, swollen lymph nodes, body aches, joint swelling, muscle aches, chest pain, shortness of breath, mood changes, visual or auditory hallucinations.   Objective:   General: Well Developed, well nourished, and in no acute distress.  Neuro:  Extra-ocular muscles intact, able to move all 4 extremities, sensation grossly intact.  Deep tendon reflexes tested were normal. Psych: Alert and oriented, mood congruent with affect. ENT:  Ears and nose appear unremarkable.  Hearing grossly normal. Neck: Unremarkable overall appearance, trachea midline.  No visible thyroid enlargement. Eyes: Conjunctivae and lids appear unremarkable.  Pupils equal and round. Skin: Warm and dry, no rashes noted.  Cardiovascular: Pulses palpable, no extremity edema. Back Exam:  Inspection: Unremarkable  Motion: Flexion 45 deg, Extension 45 deg, Side Bending to 45 deg bilaterally,  Rotation to 45 deg bilaterally  SLR laying: Negative  XSLR laying: Negative  Palpable tenderness: None. FABER: negative. Sensory  change: Gross sensation intact to all lumbar and sacral dermatomes.  Reflexes: 2+ at both patellar tendons, 2+ at achilles tendons, Babinski's downgoing.  Strength at foot  Plantar-flexion: 5/5 Dorsi-flexion: 5/5 Eversion: 5/5 Inversion: 5/5  Leg strength   Quad: 5/5 Hamstring: 5/5 Hip flexor: 5/5 Hip abductors: 5/5  Gait unremarkable.  Lumbar spine x-ray personally reviewed, multilevel degenerative endplate changes.  Mild grade 1 spondylolisthesis of L5 on S1.  Impression and Recommendations:   This case required medical decision making of moderate complexity.  Lumbar degenerative disc disease Symptoms consistent with lumbar spinal stenosis. We discussed the pathophysiology and anthropology of degenerative disc disease. Formal physical therapy, 5 days of prednisone, meloxicam. Return to see me in 6 weeks, MR for interventional planning if no better.  Urinary frequency I do suspect her symptoms are from overactive bladder, and atrophic vaginitis. She does desire that I check a single additional urinalysis and urine culture.  ___________________________________________ Gwen Her. Dianah Field, M.D., ABFM., CAQSM. Primary Care and [employer] Instructor of [place] [facility]

## 2017-08-26 LAB — URINE CULTURE
MICRO NUMBER:: 90133558
Result:: NO GROWTH
SPECIMEN QUALITY:: ADEQUATE

## 2017-08-26 LAB — URINALYSIS
Bilirubin Urine: NEGATIVE
Glucose, UA: NEGATIVE
Hgb urine dipstick: NEGATIVE
Ketones, ur: NEGATIVE
Leukocytes, UA: NEGATIVE
Nitrite: NEGATIVE
Protein, ur: NEGATIVE
Specific Gravity, Urine: 1.013 (ref 1.001–1.03)
pH: 7.5 (ref 5.0–8.0)

## 2017-08-29 ENCOUNTER — Ambulatory Visit: Payer: Medicare Other | Admitting: Physical Therapy

## 2017-09-03 ENCOUNTER — Other Ambulatory Visit: Payer: Self-pay | Admitting: Physician Assistant

## 2017-09-07 ENCOUNTER — Other Ambulatory Visit: Payer: Self-pay | Admitting: Physician Assistant

## 2017-09-07 DIAGNOSIS — K219 Gastro-esophageal reflux disease without esophagitis: Secondary | ICD-10-CM

## 2017-09-16 ENCOUNTER — Ambulatory Visit (INDEPENDENT_AMBULATORY_CARE_PROVIDER_SITE_OTHER): Payer: Medicare Other | Admitting: Rehabilitative and Restorative Service Providers"

## 2017-09-16 ENCOUNTER — Encounter: Payer: Self-pay | Admitting: Rehabilitative and Restorative Service Providers"

## 2017-09-16 DIAGNOSIS — M6281 Muscle weakness (generalized): Secondary | ICD-10-CM

## 2017-09-16 DIAGNOSIS — M5441 Lumbago with sciatica, right side: Secondary | ICD-10-CM | POA: Diagnosis not present

## 2017-09-16 DIAGNOSIS — R29898 Other symptoms and signs involving the musculoskeletal system: Secondary | ICD-10-CM

## 2017-09-16 NOTE — Patient Instructions (Signed)
Abdominal Bracing With Pelvic Floor (Hook-Lying)    With neutral spine, tighten pelvic floor and abdominals sucking belly button to back bone, tighten muscles in low back at waist. Hold 10 sec  Repeat _10__ times. Do _several __ times a day. Progress to do this in sitting, standing, walking and with functional activities   HIP: Hamstrings - Supine  Place strap around foot. Raise leg up, keeping knee straight.  Bend opposite knee to protect back if indicated. Hold 30 seconds. 3 reps per set, 2-3 sets per day  Outer Hip Stretch: Reclined IT Band Stretch (Strap)   Strap around one foot, pull leg across body until you feel a pull or stretch in the outside of your hip, with shoulders on mat. Hold for 30 seconds. Repeat 3 times each leg. 2-3 times/day.  Piriformis Stretch   Lying on back, pull right knee toward opposite shoulder. Hold 30 seconds. Repeat 3 times. Do 2-3 sessions per day.   TENS UNIT: This is helpful for muscle pain and spasm.   Search and Purchase a TENS 7000 2nd edition at www.tenspros.com. It should be less than $30.     TENS unit instructions: Do not shower or bathe with the unit on Turn the unit off before removing electrodes or batteries If the electrodes lose stickiness add a drop of water to the electrodes after they are disconnected from the unit and place on plastic sheet. If you continued to have difficulty, call the TENS unit company to purchase more electrodes. Do not apply lotion on the skin area prior to use. Make sure the skin is clean and dry as this will help prolong the life of the electrodes. After use, always check skin for unusual red areas, rash or other skin difficulties. If there are any skin problems, does not apply electrodes to the same area. Never remove the electrodes from the unit by pulling the wires. Do not use the TENS unit or electrodes other than as directed. Do not change electrode placement without consultating your therapist  or physician. Keep 2 fingers with between each electrode.

## 2017-09-16 NOTE — Therapy (Signed)
[place], [place], [address] Phone: [phone]   Fax:  [phone]  Physical Therapy Evaluation  Patient Details  Name: Sara Gardner MRN: [id number] Date of Birth: [birth year] Referring Provider: Dr Debria Garret    Encounter Date: 09/16/2017  PT End of Session - 09/16/17 1324    Visit Number  1    Number of Visits  12    Date for PT Re-Evaluation  10/28/17    PT Start Time  2376    PT Stop Time  1423    PT Time Calculation (min)  61 min    Activity Tolerance  Patient tolerated treatment well       Past Medical History:  Diagnosis Date  . Aortic atherosclerosis ([place]) 06/10/2017  . CAD (coronary artery disease)   . Cancer ([place])   . Cardiomegaly 06/10/2017  . 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 Dermatochalasis of both eyelids 04/07/2017  . Diabetes mellitus without complication ([place])   . Diastolic heart failure, NYHA class 1 ([place])   . GERD (gastroesophageal reflux disease)   . Hypertension   . Lumbar degenerative disc disease 03/09/2017  . Myocardial infarction ([place]) 2005  . Papillary thyroid carcinoma ([place])   . Posterior vitreous detachment, right eye 04/07/2017  . Thyroid disease     History reviewed. No pertinent surgical history.  There were no vitals filed for this visit.   Subjective Assessment - 09/16/17 1333    Subjective  Patient reports that she noticed Rt LBP about 2 months ago. She does not know af any injury. She has had previous episodes of similar LBP for the past six months.     Pertinent History  AODM; MI; HTN;     How long can you sit comfortably?  no limit when she is not having pain - not at sll when back is hurting     How long can you stand comfortably?  5 min     How long can you walk comfortably?  not at all when in pain     Diagnostic tests  xrays - lumbar DDD    Patient Stated Goals  get rid of the LBP and keep it from coming  back     Currently in Pain?  Yes    Pain Score  0-No pain 8/10 when hurting     Pain Location  Back    Pain Orientation  Right;Lower    Pain Descriptors / Indicators  Sharp    Pain Type  Chronic pain    Pain Radiating Towards  down outside of Rt thigh to knee     Pain Onset  More than a month ago    Pain Frequency  Intermittent    Aggravating Factors   unsure - when hurting pt unable to sit or lie down     Pain Relieving Factors  heating pad          [facility] PT Assessment - 09/16/17 0001      Assessment   Medical Diagnosis  DDD lumbar spine     Referring Provider  Dr Debria Garret     Onset Date/Surgical Date  08/26/17    Hand Dominance  Right    Next MD Visit  3/19     Prior Therapy  none       Precautions   Precautions  None      Balance Screen   Has the patient fallen in the past 6  months  No    Has the patient had a decrease in activity level because of a fear of falling?   No    Is the patient reluctant to leave their home because of a fear of falling?   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     Leisure  household chores; [place] - reading computer phone       Observation/Other Assessments   Focus on Therapeutic Outcomes (FOTO)   50% limitation       Sensation   Additional Comments  WFL's per pt report       Posture/Postural Control   Posture Comments  stands with bilat knees hypersetended; slightly flexed forward at hips; head forward; shoulders rounded and elevated; wt shifted to Rt       AROM   Lumbar Flexion  80%    Lumbar Extension  65%    Lumbar - Right Side Bend  75%    Lumbar - Left Side Bend  70%    Lumbar - Right Rotation  40% discomfort     Lumbar - Left Rotation  45%      Strength   Overall Strength Comments  5/5 bilat hips except Rt  hip extension and abduction 4+/5     Right/Left Knee  -- 5/5 Rt knee flex/ext and Lt knee flex    Right Knee Extension  4/5 painful       Flexibility   Hamstrings  tight Rt ~ 80 deg Lt 85 deg     Quadriceps  tight Rt 95 deg; Lt 90 deg     ITB  tight Rt     Piriformis  tiught Rt       Palpation   Spinal mobility  hypomobility through lumbar spine with discomfort with CPA mobs and Rt > Lt lateral mobs     SI assessment   ~ symmetrical boney landmarks in corrected standing  with equal wt bearing     Palpation comment  muscular tightness Rt > Lt QL and lumbar paraspinals; Rt hip abductors/rotators              Objective measurements completed on examination: See above findings.      [facility] Adult PT Treatment/Exercise - 09/16/17 0001      Lumbar Exercises: Stretches   Passive Hamstring Stretch  2 reps;30 seconds supine with strap     Press Ups  -- 1 sec pause x 10 reps to pt tolerance partial range only     ITB Stretch  2 reps;30 seconds supine with strap     Piriformis Stretch  2 reps;30 seconds supine travell       Lumbar Exercises: Supine   Other Supine Lumbar Exercises  3 part core 10 sec x 10 reps       Moist Heat Therapy   Number Minutes Moist Heat  20 Minutes    Moist Heat Location  Lumbar Spine;Hip Rt      Electrical Stimulation   Electrical Stimulation Location  Rt lumbar/QL/piriformis/glut med     Printmaker Action  IFC    Electrical Stimulation Parameters  to tolerance    Electrical Stimulation Goals  Pain;Tone  PT Education - 09/16/17 1419    Education provided  Yes    Education Details  HEP TENs     Person(s) Educated  Patient    Methods  Explanation;Demonstration;Tactile cues;Verbal cues;Handout    Comprehension  Verbalized understanding;Returned demonstration;Verbal cues required;Tactile cues required          PT Long Term Goals - 09/16/17 1428      PT LONG TERM GOAL #1   Title  Improve core strength and stability  allowing patient to perform lumbar stabilization exercises thus improving functional abilities 10/28/17    Time  6    Period  Weeks    Status  New      PT LONG TERM GOAL #2   Title  Increase LE strength to 5-/5 to 5/5 10/28/17    Time  6    Period  Weeks    Status  New      PT LONG TERM GOAL #3   Title  Decrease pain and episodes of flare up of LBP with patient reporting no more than 2-4/10 pain level with episodes of pain no more frequent than every 2-4 weeks 10/28/17    Time  6    Period  Weeks    Status  New      PT LONG TERM GOAL #4   Title  Independent in HEP 10/28/17    Time  6    Period  Weeks    Status  New      PT LONG TERM GOAL #5   Title  Improve FOTO to </= 35% limitation 10/28/17    Time  6    Period  Weeks    Status  New             Plan - 09/16/17 1423    Clinical Impression Statement  Fraser Din presents with recurrent Rt sided LBP. She has a history of lumbar DDD per xray. Fraser Din also has arthritis of Lt > Rt knee with Lt knee painful at this time. Fraser Din presents with limited trunk and LE mobility/ROM/strength; tenderness and tightness with spring testing through lumbar spine; muscular tightness through the Rt lumbar paraspinals/QL/glut med/piriformis musculature; Rt sided LB pain on an intermittent basis. Rt LBP may be precipitated by limp on Lt LE when arthritis of Lt knee is problematic. Fraser Din will benefit from PT to address problems identified.     Clinical Presentation  Stable    Clinical Decision Making  Low    Rehab Potential  Good    Clinical Impairments Affecting Rehab Potential  arthritis Lt > Rt knees; sedentary lifestyle     PT Frequency  2x / week    PT Duration  6 weeks    PT Treatment/Interventions  Patient/family education;ADLs/Self Care Home Management;Electrical Stimulation;Cryotherapy;Iontophoresis 4mg /ml Dexamethasone;Moist Heat;Ultrasound;Dry needling;Manual techniques;Neuromuscular re-education;Therapeutic activities;Therapeutic exercise    PT Next Visit  Plan  review HEP; add quad stretch/hip flexor stretch/calf stretch as Lt knee pain allows; progress with core stabilization; manual work through the Rt lumbar to hip area; modalities as indicated     Consulted and Agree with Plan of Care  Patient       Patient will benefit from skilled therapeutic intervention in order to improve the following deficits and impairments:  Postural dysfunction, Improper body mechanics, Pain, Increased fascial restricitons, Increased muscle spasms, Hypomobility, Decreased mobility, Decreased range of motion, Decreased strength, Decreased activity tolerance  Visit Diagnosis: Acute right-sided low back pain with right-sided sciatica - Plan: PT plan of care cert/re-cert  Muscle weakness (generalized) - Plan: PT plan of care cert/re-cert  Other symptoms and signs involving the musculoskeletal system - Plan: PT plan of care cert/re-cert     Problem List Patient Active Problem List   Diagnosis Date Noted  . Urge incontinence 08/23/2017  . 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/23/2017  . Urinary frequency 08/17/2017  . Overactive bladder 08/17/2017  . Equinus contracture of ankle 07/27/2017  . Hammer toes of both feet 07/27/2017  . Dermatophytosis, nail 07/27/2017  . Calcaneal spur of both feet 07/27/2017  . Dysuria 07/07/2017  . Atypical chest pain 06/10/2017  . Aortic atherosclerosis ([place]) 06/10/2017  . Cardiomegaly 06/10/2017  . Vitreous detachment of right eye 05/12/2017  . 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 Dermatochalasis of both eyelids 04/07/2017  . Posterior vitreous detachment, right eye 04/07/2017  . Acute bilateral low back pain without sciatica 03/09/2017  . 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 Lumbar degenerative disc disease 03/09/2017  . Acute right flank pain 03/07/2017  . Primary osteoarthritis of left knee 03/04/2017  . Chronic pain of left knee 03/04/2017  . Bilateral  primary osteoarthritis of knee 12/15/2016  . Bilateral lower extremity edema 12/15/2016  . Allergy to honey bee venom 12/15/2016  . Cyst of right kidney 07/09/2016  . Essential hypertension 02/16/2016  . DM type 2 with diabetic dyslipidemia ([place]) 02/10/2016  . Gastroesophageal reflux disease without esophagitis 02/10/2016  . Breast pain, left 12/30/2015  . 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 Diabetic sensorimotor polyneuropathy ([place]) 10/27/2015  . Papillary thyroid carcinoma ([place]) 08/09/2014  . Post-menopausal atrophic vaginitis 06/24/2014  . Left thyroid nodule 01/29/2014  . Nodule of left lung 01/21/2014  . Obesity 06/12/2012  . Palpitations 11/29/2011  . Abnormal mammogram of right breast 08/30/2011  . Arthritis of left knee 05/24/2011  . Coronary artery disease involving native coronary artery of native heart with angina pectoris ([place]) 05/24/2011  . History of acute myocardial infarction of inferior wall 05/24/2011    Celyn Nilda Simmer PT, MPH  09/16/2017, 2:35 PM  [facility] [place], [place], [address] Phone: [phone]   Fax:  [phone]  Name: TAESHA GOODELL MRN: [id number] Date of Birth: 02-03-1951

## 2017-09-21 ENCOUNTER — Ambulatory Visit (INDEPENDENT_AMBULATORY_CARE_PROVIDER_SITE_OTHER): Payer: Medicare Other | Admitting: Physical Therapy

## 2017-09-21 ENCOUNTER — Ambulatory Visit (INDEPENDENT_AMBULATORY_CARE_PROVIDER_SITE_OTHER): Payer: Medicare Other | Admitting: Physician Assistant

## 2017-09-21 ENCOUNTER — Encounter: Payer: Self-pay | Admitting: Physician Assistant

## 2017-09-21 VITALS — BP 111/71 | HR 65 | Wt 175.0 lb

## 2017-09-21 DIAGNOSIS — M5441 Lumbago with sciatica, right side: Secondary | ICD-10-CM | POA: Diagnosis not present

## 2017-09-21 DIAGNOSIS — R29898 Other symptoms and signs involving the musculoskeletal system: Secondary | ICD-10-CM | POA: Diagnosis not present

## 2017-09-21 DIAGNOSIS — M6281 Muscle weakness (generalized): Secondary | ICD-10-CM

## 2017-09-21 DIAGNOSIS — Z111 Encounter for screening for respiratory tuberculosis: Secondary | ICD-10-CM | POA: Diagnosis not present

## 2017-09-21 NOTE — Progress Notes (Signed)
HPI:                                                                Sara Gardner is a 67 y.o. female who presents to [facility] today for tuberculosis screening  Employer-requested TB screening. 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 She will be working part-time. No known TB exposure. No hx of latent TB.  Depression screen PHQ 2/9 05/12/2017  Decreased Interest 0  Down, Depressed, Hopeless 0  PHQ - 2 Score 0    No flowsheet data found.    Past Medical History:  Diagnosis Date  . Aortic atherosclerosis ([place]) 06/10/2017  . CAD (coronary artery disease)   . Cancer ([place])   . Cardiomegaly 06/10/2017  . 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 Dermatochalasis of both eyelids 04/07/2017  . Diabetes mellitus without complication ([place])   . Diastolic heart failure, NYHA class 1 ([place])   . GERD (gastroesophageal reflux disease)   . Hypertension   . Lumbar degenerative disc disease 03/09/2017  . Myocardial infarction ([place]) 2005  . Papillary thyroid carcinoma ([place])   . Posterior vitreous detachment, right eye 04/07/2017  . Thyroid disease    No past surgical history on file. Social History   Tobacco Use  . Smoking status: Never Smoker  . Smokeless tobacco: Never Used  Substance Use Topics  . Alcohol use: No   family history is not on file.    ROS: negative except as noted in the HPI  Medications: Current Outpatient Medications  Medication Sig Dispense Refill  . acetaminophen (TYLENOL) 650 MG CR tablet Take 2 tablets (1,300 mg total) by mouth every 8 (eight) hours as needed for pain. 90 tablet 3  . AMBULATORY NON FORMULARY MEDICATION Knee-high, medium compression, graduated compression stockings. Apply to lower extremities. [email or web address], Zippered Compression Stockings, medium circ, long length 1 each 0  . ammonium  lactate (LAC-HYDRIN) 12 % lotion APP TOPICALLY PRF DRY SKIN  0  . aspirin 81 MG chewable tablet Chew by mouth.    . Calcium Carbonate-Vitamin D 600-400 MG-UNIT tablet Take 1 tablet by mouth 2 (two) times daily. 60 tablet 11  . conjugated estrogens (PREMARIN) vaginal cream Insert 0.5 g intravaginally daily for 1 week, then twice weekly 30 g 5  . diclofenac sodium (VOLTAREN) 1 % GEL Apply 4 g topically 4 (four) times daily. To affected joint. 100 g 1  . EPINEPHrine 0.3 mg/0.3 mL IJ SOAJ injection Inject 1 each as directed as needed. 1 Device 1  . esomeprazole (NEXIUM) 20 MG capsule TAKE 1 CAPSULE(20 MG) BY MOUTH DAILY 90 capsule 0  . Lancet Devices (ONE TOUCH DELICA LANCING DEV) MISC Check fasting blood sugar daily and 2 hours after largest meal of the day. Dx DM. 100 each prn  . Lancets (ONETOUCH ULTRASOFT) lancets Check fasting blood sugar daily and 2 hours after largest meal of the day. Dx DM. 100 each prn  . levothyroxine (SYNTHROID, LEVOTHROID) 100 MCG tablet Take 100 mcg by mouth daily.  1  . lisinopril-hydrochlorothiazide (PRINZIDE,ZESTORETIC) 20-12.5 MG tablet TAKE 1 TABLET BY MOUTH DAILY 90 tablet 1  . meloxicam (MOBIC) 15 MG tablet One  tab PO qAM with breakfast for 2 weeks, then daily prn pain. 30 tablet 3  . metFORMIN (GLUCOPHAGE) 1000 MG tablet TAKE 1 TABLET BY MOUTH EVERY MORNING 90 tablet 2  . metoprolol succinate (TOPROL-XL) 50 MG 24 hr tablet TAKE 1 TABLET BY MOUTH EVERY DAY FOR HIGH BLOOD PRESSURE 90 tablet 0  . nitroGLYCERIN (NITROSTAT) 0.4 MG SL tablet Place 1 tablet (0.4 mg total) every 5 (five) minutes as needed under the tongue for chest pain. Max dose 3 tablets in 15 minutes 20 tablet 1  . ONE TOUCH ULTRA TEST test strip CHECK FASTING BLOOD SUGAR DAILY AND 2 HOURS AFTER LARGEST MEAL OF THE DAY 100 each 1  . rosuvastatin (CRESTOR) 20 MG tablet Take 1 tablet (20 mg total) by mouth daily. 90 tablet 3   No current facility-administered medications for this visit.    Allergies   Allergen Reactions  . Bee Venom Swelling  . Tuberculin Tests Other (See Comments)  . Tape Rash       Objective:  BP 111/71   Pulse 65   Wt 175 lb (79.4 kg)   BMI 31.00 kg/m  Gen:  alert, not ill-appearing, no distress, appropriate for age 66: head normocephalic without obvious abnormality, conjunctiva and cornea clear, trachea midline Pulm: Normal work of breathing, normal phonation, clear to auscultation bilaterally, no wheezes, rales or rhonchi CV: Normal rate, regular rhythm, s1 and s2 distinct, no murmurs, clicks or rubs  Neuro: alert and oriented x 3, no tremor MSK: extremities atraumatic, normal gait and station Skin: intact, no rashes on exposed skin, no jaundice, no cyanosis Psych: well-groomed, cooperative, good eye contact, euthymic mood, affect mood-congruent, speech is articulate, and thought processes clear and goal-directed    No results found for this or any previous visit (from the past 72 hour(s)). No results found.    Assessment and Plan: 67 y.o. female with   1. Tuberculosis screening - QuantiFERON-TB Gold Plus     Patient education and anticipatory guidance given Patient agrees with treatment plan Follow-up as needed if symptoms worsen or fail to improve  Darlyne Russian PA-C

## 2017-09-21 NOTE — Therapy (Signed)
[place], [place], [address] Phone: [phone]   Fax:  [phone]  Physical Therapy Treatment  Patient Details  Name: Sara Gardner MRN: [id number] Date of Birth: 06/24/1951 Referring Provider: Dr. Dianah Field   Encounter Date: 09/21/2017  PT End of Session - 09/21/17 0912    Visit Number  2    Number of Visits  12    Date for PT Re-Evaluation  10/28/17    PT Start Time  0802    PT Stop Time  0905    PT Time Calculation (min)  63 min    Activity Tolerance  Patient tolerated treatment well;No increased pain    Behavior During Therapy  WFL for tasks assessed/performed       Past Medical History:  Diagnosis Date  . Aortic atherosclerosis ([place]) 06/10/2017  . CAD (coronary artery disease)   . Cancer ([place])   . Cardiomegaly 06/10/2017  . 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 Dermatochalasis of both eyelids 04/07/2017  . Diabetes mellitus without complication ([place])   . Diastolic heart failure, NYHA class 1 ([place])   . GERD (gastroesophageal reflux disease)   . Hypertension   . Lumbar degenerative disc disease 03/09/2017  . Myocardial infarction ([place]) 2005  . Papillary thyroid carcinoma ([place])   . Posterior vitreous detachment, right eye 04/07/2017  . Thyroid disease     No past surgical history on file.  There were no vitals filed for this visit.  Subjective Assessment - 09/21/17 0806    Subjective  Pat reports she was more sore when she left after eval.  She slept for 4 hours and used heating pad and pain reduced.     Currently in Pain?  No/denies    Pain Score  0-No pain    Pain Location  Back    Pain Orientation  Right    Aggravating Factors   lifting anything over 10#, quick movements    Pain Relieving Factors  rest, heating pad.          [facility] PT Assessment - 09/21/17 0001      Assessment   Medical Diagnosis  DDD lumbar spine     Referring Provider   Dr. Dianah Field    Onset Date/Surgical Date  08/26/17    Hand Dominance  Right    Next MD Visit  10/10/17       [facility] Adult PT Treatment/Exercise - 09/21/17 0001      Lumbar Exercises: Stretches   Passive Hamstring Stretch  30 seconds;4 reps seated and supine    Prone on Elbows Stretch  2 reps;30 seconds    Quad Stretch  Right;Left;3 reps;30 seconds 2 prone, 1 seated.     ITB Stretch  2 reps;20 seconds supine with strap     Piriformis Stretch  30 seconds;4 reps seated and supine      Lumbar Exercises: Aerobic   Nustep  L4: legs only x 5.5 min  PTA present to discuss progress      Lumbar Exercises: Seated   Other Seated Lumbar Exercises  Core engagement in sitting x 10 sec x 5 reps (VC to breathe); core engagement with hands pressing into lap x 5 sec x 10 reps. 3 reps of sit to stand with core engaged.      Lumbar Exercises: Supine   Ab Set  --    Bent Knee Raise  --      Modalities   Modalities  Moist Heat;Electrical Stimulation      Moist Heat Therapy   Number Minutes Moist Heat  20 Minutes    Moist Heat Location  Lumbar Spine;Hip Rt      Electrical Stimulation   Electrical Stimulation Location  Rt lumbar/QL/piriformis/glut med     Printmaker Action  IFC    Electrical Stimulation Parameters   to tolerance     Electrical Stimulation Goals  Pain;Tone             PT Education - 09/21/17 1302    Education provided  Yes    Education Details  HEP - seated HS and quad stretch.     Person(s) Educated  Patient    Methods  Explanation;Handout;Demonstration;Verbal cues    Comprehension  Verbalized understanding;Returned demonstration          PT Long Term Goals - 09/21/17 0954      PT LONG TERM GOAL #1   Title  Improve core strength and stability allowing patient to perform lumbar stabilization exercises thus improving functional abilities 10/28/17    Time  6    Period  Weeks    Status  On-going      PT LONG TERM GOAL #2   Title  Increase LE  strength to 5-/5 to 5/5 10/28/17    Time  6    Period  Weeks    Status  On-going      PT LONG TERM GOAL #3   Title  Decrease pain and episodes of flare up of LBP with patient reporting no more than 2-4/10 pain level with episodes of pain no more frequent than every 2-4 weeks 10/28/17    Time  6    Period  Weeks    Status  On-going      PT LONG TERM GOAL #4   Title  Independent in HEP 10/28/17    Time  6    Period  Weeks    Status  On-going      PT LONG TERM GOAL #5   Title  Improve FOTO to </= 35% limitation 10/28/17    Time  6    Period  Weeks    Status  On-going            Plan - 09/21/17 0950    Clinical Impression Statement  Pt tolerated all exercises well, without increase in pain. She required cues for form and to slow counting speed of exercise.  Pt will benefit from continued PT intervention to decrease risk of reinjury and improve mobility.     Rehab Potential  Good    PT Frequency  2x / week    PT Duration  6 weeks    PT Treatment/Interventions  Patient/family education;ADLs/Self Care Home Management;Electrical Stimulation;Cryotherapy;Iontophoresis 4mg /ml Dexamethasone;Moist Heat;Ultrasound;Dry needling;Manual techniques;Neuromuscular re-education;Therapeutic activities;Therapeutic exercise    PT Next Visit Plan  assess response to HEP.  educate on body mechanics with core engagement.     Consulted and Agree with Plan of Care  Patient       Patient will benefit from skilled therapeutic intervention in order to improve the following deficits and impairments:  Postural dysfunction, Improper body mechanics, Pain, Increased fascial restricitons, Increased muscle spasms, Hypomobility, Decreased mobility, Decreased range of motion, Decreased strength, Decreased activity tolerance  Visit Diagnosis: Acute right-sided low back pain with right-sided sciatica  Muscle weakness (generalized)  Other symptoms and signs involving the musculoskeletal system     Problem  List Patient Active Problem List   Diagnosis Date  Noted  . Urge incontinence 08/23/2017  . 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/23/2017  . Urinary frequency 08/17/2017  . Overactive bladder 08/17/2017  . Equinus contracture of ankle 07/27/2017  . Hammer toes of both feet 07/27/2017  . Dermatophytosis, nail 07/27/2017  . Calcaneal spur of both feet 07/27/2017  . Dysuria 07/07/2017  . Atypical chest pain 06/10/2017  . Aortic atherosclerosis ([place]) 06/10/2017  . Cardiomegaly 06/10/2017  . Vitreous detachment of right eye 05/12/2017  . 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 Dermatochalasis of both eyelids 04/07/2017  . Posterior vitreous detachment, right eye 04/07/2017  . Acute bilateral low back pain without sciatica 03/09/2017  . 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 Lumbar degenerative disc disease 03/09/2017  . Acute right flank pain 03/07/2017  . Primary osteoarthritis of left knee 03/04/2017  . Chronic pain of left knee 03/04/2017  . Bilateral primary osteoarthritis of knee 12/15/2016  . Bilateral lower extremity edema 12/15/2016  . Allergy to honey bee venom 12/15/2016  . Cyst of right kidney 07/09/2016  . Essential hypertension 02/16/2016  . DM type 2 with diabetic dyslipidemia ([place]) 02/10/2016  . Gastroesophageal reflux disease without esophagitis 02/10/2016  . Breast pain, left 12/30/2015  . 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 Diabetic sensorimotor polyneuropathy ([place]) 10/27/2015  . Papillary thyroid carcinoma ([place]) 08/09/2014  . Post-menopausal atrophic vaginitis 06/24/2014  . Left thyroid nodule 01/29/2014  . Nodule of left lung 01/21/2014  . Obesity 06/12/2012  . Palpitations 11/29/2011  . Abnormal mammogram of right breast 08/30/2011  . Arthritis of left knee 05/24/2011  . Coronary artery disease involving native coronary artery of native heart with angina pectoris ([place]) 05/24/2011  . History of  acute myocardial infarction of inferior wall 05/24/2011   Kerin Perna, PTA 09/21/17 1:04 PM  [facility] [place], [place], [address] Phone: [phone]   Fax:  [phone]  Name: Sara Gardner MRN: [id number] Date of Birth: [birth year]

## 2017-09-23 ENCOUNTER — Ambulatory Visit (INDEPENDENT_AMBULATORY_CARE_PROVIDER_SITE_OTHER): Payer: Medicare Other | Admitting: Physical Therapy

## 2017-09-23 DIAGNOSIS — R29898 Other symptoms and signs involving the musculoskeletal system: Secondary | ICD-10-CM | POA: Diagnosis not present

## 2017-09-23 DIAGNOSIS — M5441 Lumbago with sciatica, right side: Secondary | ICD-10-CM

## 2017-09-23 DIAGNOSIS — M6281 Muscle weakness (generalized): Secondary | ICD-10-CM | POA: Diagnosis not present

## 2017-09-23 LAB — QUANTIFERON-TB GOLD PLUS
Mitogen-NIL: >10 IU/mL
NIL: 0.06 IU/mL
QuantiFERON-TB Gold Plus: NEGATIVE
TB1-NIL: <0 IU/mL
TB2-NIL: 0.01 [IU]/mL

## 2017-09-23 NOTE — Progress Notes (Signed)
Negative TB test Hardcopy available at front desk for pick-up

## 2017-09-23 NOTE — Patient Instructions (Addendum)
  Abdominal Bracing With Pelvic Floor (Hook-Lying)   With neutral spine, tighten pelvic floor and abdominals. Hold 10 seconds. Repeat __10_ times. Do _several__ times a day. Can be performed in sitting, standing. .  Knee to Chest: Transverse Plane Stability   Bring one knee up, then return. Be sure pelvis does not roll side to side. Keep pelvis still. Lift knee __10_ times each leg. Restabilize pelvis. Repeat with other leg. Do _1-2__ sets, _1-2__ times per day.  Hip External Rotation With Pillow: Transverse Plane Stability   KEEP BOTH KNEES BENT.  Slowly roll bent knee out. Be sure pelvis does not rotate. Do _10__ times. Restabilize pelvis. Repeat with other leg. Do _1-2__ sets, _1__ times per day.  Therapeutic - Bridging    Lift buttocks, keeping back straight and arms on floor. Press through arms/ feet, core engaged.  Breathe ! Can say, "up and down" to ensure not holding breath.   Repeat __10__ times.    [facility] at [facility] [place], [place] [address]  [phone] (office) [phone] (fax)

## 2017-09-23 NOTE — Therapy (Signed)
[place], [place], [address] Phone: [phone]   Fax:  [phone]  Physical Therapy Treatment  Patient Details  Name: Sara Gardner MRN: [id number] Date of Birth: October 28, 1950 Referring Provider: Dr. Dianah Field   Encounter Date: 09/23/2017  PT End of Session - 09/23/17 0810    Visit Number  3    Number of Visits  12    Date for PT Re-Evaluation  10/28/17    PT Start Time  0805    PT Stop Time  0900    PT Time Calculation (min)  55 min       Past Medical History:  Diagnosis Date  . Aortic atherosclerosis ([place]) 06/10/2017  . CAD (coronary artery disease)   . Cancer ([place])   . Cardiomegaly 06/10/2017  . 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 Dermatochalasis of both eyelids 04/07/2017  . Diabetes mellitus without complication ([place])   . Diastolic heart failure, NYHA class 1 ([place])   . GERD (gastroesophageal reflux disease)   . Hypertension   . Lumbar degenerative disc disease 03/09/2017  . Myocardial infarction ([place]) 2005  . Papillary thyroid carcinoma ([place])   . Posterior vitreous detachment, right eye 04/07/2017  . Thyroid disease     No past surgical history on file.  There were no vitals filed for this visit.  Subjective Assessment - 09/23/17 0808    Subjective  Pt reports she was moving a heavy chair yesterday and her low back hurt after that, up to 7/10.  she used heat and pain resolved with that and rest. She did not have any increased symptoms following last visit.     Pertinent History  AODM; MI; HTN;     Patient Stated Goals  get rid of the LBP and keep it from coming back     Currently in Pain?  No/denies    Pain Score  0-No pain    Pain Location  Back         [facility] PT Assessment - 09/23/17 0001      Assessment   Medical Diagnosis  DDD lumbar spine     Referring Provider  Dr. Dianah Field    Onset Date/Surgical Date  08/26/17    Hand Dominance   Right    Next MD Visit  10/10/17      Flexibility   Quadriceps  bilat ~105 deg in prone stretch        [facility] Adult PT Treatment/Exercise - 09/23/17 0001      Lumbar Exercises: Stretches   Passive Hamstring Stretch  Right;Left;2 reps    Prone on Elbows Stretch  2 reps;30 seconds    Quad Stretch  Right;Left;2 reps;30 seconds prone with strap      Lumbar Exercises: Aerobic   Recumbent Bike  L1: 4 min.       Lumbar Exercises: Seated   Sit to Stand  5 reps with core engaged.       Lumbar Exercises: Supine   Ab Set  5 reps;5 seconds    Clam  10 reps with ab set    Bent Knee Raise  10 reps;5 seconds;Limitations with ab set    Bent Knee Raise Limitations  VC to breathe, VC to count- improved with demo and cues.     Bridge  10 reps VC to breathe during exertion.       Lumbar Exercises: Prone   Straight Leg Raise  5 reps tactile cues for glute/HS  engagement vs back    Opposite Arm/Leg Raise  Right arm/Left leg;Left arm/Right leg;5 reps VC for core engagement.       Moist Heat Therapy   Number Minutes Moist Heat  15 Minutes    Moist Heat Location  Lumbar Spine;Hip Rt      Electrical Stimulation   Electrical Stimulation Location  Rt lumbar/QL/piriformis/glut med     Printmaker Action  IFC    Electrical Stimulation Parameters  to tolerance     Electrical Stimulation Goals  Pain;Tone             PT Education - 09/23/17 0845    Education provided  Yes    Education Details  HEP    Person(s) Educated  Patient    Methods  Explanation;Demonstration;Tactile cues;Verbal cues;Handout    Comprehension  Verbalized understanding;Returned demonstration          PT Long Term Goals - 09/21/17 0954      PT LONG TERM GOAL #1   Title  Improve core strength and stability allowing patient to perform lumbar stabilization exercises thus improving functional abilities 10/28/17    Time  6    Period  Weeks    Status  On-going      PT LONG TERM GOAL #2   Title  Increase LE  strength to 5-/5 to 5/5 10/28/17    Time  6    Period  Weeks    Status  On-going      PT LONG TERM GOAL #3   Title  Decrease pain and episodes of flare up of LBP with patient reporting no more than 2-4/10 pain level with episodes of pain no more frequent than every 2-4 weeks 10/28/17    Time  6    Period  Weeks    Status  On-going      PT LONG TERM GOAL #4   Title  Independent in HEP 10/28/17    Time  6    Period  Weeks    Status  On-going      PT LONG TERM GOAL #5   Title  Improve FOTO to </= 35% limitation 10/28/17    Time  6    Period  Weeks    Status  On-going            Plan - 09/23/17 0831    Clinical Impression Statement  Pt demonstrated improved quad flexibility by 10-15 deg since eval, also reporting less knee pain.  Pt required some cues for breathing with exercise; improved speed of counting. She tolerated all exercises well, with min/no increase in symptoms.     Rehab Potential  Good    Clinical Impairments Affecting Rehab Potential  arthritis Lt > Rt knees; sedentary lifestyle     PT Frequency  2x / week    PT Duration  6 weeks    PT Treatment/Interventions  Patient/family education;ADLs/Self Care Home Management;Electrical Stimulation;Cryotherapy;Iontophoresis 4mg /ml Dexamethasone;Moist Heat;Ultrasound;Dry needling;Manual techniques;Neuromuscular re-education;Therapeutic activities;Therapeutic exercise    PT Next Visit Plan  continue spine stabilization and body mechanics education.     Consulted and Agree with Plan of Care  Patient       Patient will benefit from skilled therapeutic intervention in order to improve the following deficits and impairments:  Postural dysfunction, Improper body mechanics, Pain, Increased fascial restricitons, Increased muscle spasms, Hypomobility, Decreased mobility, Decreased range of motion, Decreased strength, Decreased activity tolerance  Visit Diagnosis: Acute right-sided low back pain with right-sided sciatica  Muscle weakness  (  generalized)  Other symptoms and signs involving the musculoskeletal system     Problem List Patient Active Problem List   Diagnosis Date Noted  . Urge incontinence 08/23/2017  . 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/23/2017  . Urinary frequency 08/17/2017  . Overactive bladder 08/17/2017  . Equinus contracture of ankle 07/27/2017  . Hammer toes of both feet 07/27/2017  . Dermatophytosis, nail 07/27/2017  . Calcaneal spur of both feet 07/27/2017  . Dysuria 07/07/2017  . Atypical chest pain 06/10/2017  . Aortic atherosclerosis ([place]) 06/10/2017  . Cardiomegaly 06/10/2017  . Vitreous detachment of right eye 05/12/2017  . 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 Dermatochalasis of both eyelids 04/07/2017  . Posterior vitreous detachment, right eye 04/07/2017  . Acute bilateral low back pain without sciatica 03/09/2017  . 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 Lumbar degenerative disc disease 03/09/2017  . Acute right flank pain 03/07/2017  . Primary osteoarthritis of left knee 03/04/2017  . Chronic pain of left knee 03/04/2017  . Bilateral primary osteoarthritis of knee 12/15/2016  . Bilateral lower extremity edema 12/15/2016  . Allergy to honey bee venom 12/15/2016  . Cyst of right kidney 07/09/2016  . Essential hypertension 02/16/2016  . DM type 2 with diabetic dyslipidemia ([place]) 02/10/2016  . Gastroesophageal reflux disease without esophagitis 02/10/2016  . Breast pain, left 12/30/2015  . 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 Diabetic sensorimotor polyneuropathy ([place]) 10/27/2015  . Papillary thyroid carcinoma ([place]) 08/09/2014  . Post-menopausal atrophic vaginitis 06/24/2014  . Left thyroid nodule 01/29/2014  . Nodule of left lung 01/21/2014  . Obesity 06/12/2012  . Palpitations 11/29/2011  . Abnormal mammogram of right breast 08/30/2011  . Arthritis of left knee 05/24/2011  . Coronary artery disease  involving native coronary artery of native heart with angina pectoris ([place]) 05/24/2011  . History of acute myocardial infarction of inferior wall 05/24/2011   Kerin Perna, PTA 09/23/17 9:03 AM  [place], [place], [address] Phone: [phone]   Fax:  [phone]  Name: Sara Gardner MRN: [id number] Date of Birth: Jul 31, 1950

## 2017-09-27 ENCOUNTER — Ambulatory Visit (INDEPENDENT_AMBULATORY_CARE_PROVIDER_SITE_OTHER): Payer: Medicare Other | Admitting: Physical Therapy

## 2017-09-27 DIAGNOSIS — M5441 Lumbago with sciatica, right side: Secondary | ICD-10-CM

## 2017-09-27 DIAGNOSIS — M6281 Muscle weakness (generalized): Secondary | ICD-10-CM | POA: Diagnosis not present

## 2017-09-27 DIAGNOSIS — R29898 Other symptoms and signs involving the musculoskeletal system: Secondary | ICD-10-CM | POA: Diagnosis not present

## 2017-09-27 NOTE — Therapy (Signed)
[place], [place], [address] Phone: [phone]   Fax:  [phone]  Physical Therapy Treatment  Patient Details  Name: Sara Gardner MRN: [id number] Date of Birth: [birth year] Referring Provider: Dr. Dianah Field   Encounter Date: 09/27/2017  PT End of Session - 09/27/17 0810    Visit Number  4    Number of Visits  12    Date for PT Re-Evaluation  10/28/17    PT Start Time  0805    PT Stop Time  0907    PT Time Calculation (min)  62 min       Past Medical History:  Diagnosis Date  . Aortic atherosclerosis ([place]) 06/10/2017  . CAD (coronary artery disease)   . Cancer ([place])   . Cardiomegaly 06/10/2017  . 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 Dermatochalasis of both eyelids 04/07/2017  . Diabetes mellitus without complication ([place])   . Diastolic heart failure, NYHA class 1 ([place])   . GERD (gastroesophageal reflux disease)   . Hypertension   . Lumbar degenerative disc disease 03/09/2017  . Myocardial infarction ([place]) 2005  . Papillary thyroid carcinoma ([place])   . Posterior vitreous detachment, right eye 04/07/2017  . Thyroid disease     No past surgical history on file.  There were no vitals filed for this visit.  Subjective Assessment - 09/27/17 0810    Subjective  Pt reports she had increased pain in back after dinner (the day of last treatment). Pain was up to 10/10.  She used heating pad all weekend and rested a lot.      Patient Stated Goals  get rid of the LBP and keep it from coming back     Currently in Pain?  Yes    Pain Score  6     Pain Orientation  Right    Aggravating Factors   bending over, lifting anything over 10#, quick movement     Pain Relieving Factors  rest, heating pad          [facility] PT Assessment - 09/27/17 0001      Assessment   Medical Diagnosis  DDD lumbar spine     Referring Provider  Dr. Dianah Field    Onset Date/Surgical  Date  08/26/17    Hand Dominance  Right    Next MD Visit  10/10/17      Flexibility   Quadriceps  Lt knee 108 deg; Rt knee 99 deg       Palpation   SI assessment   Rt ASIS lower than Lt; Rt sacral torsion in prone.     Palpation comment  Point tender Rt ASIS, piriformis, glute med and QL.         [place] Adult PT Treatment/Exercise - 09/27/17 0001      Lumbar Exercises: Stretches   Passive Hamstring Stretch  Right;Left;2 reps;30 seconds    Quad Stretch  Right;Left;30 seconds;3 reps prone with strap    ITB Stretch  2 reps;20 seconds supine with strap       Lumbar Exercises: Aerobic   UBE (Upper Arm Bike)  4.5 min @ up to 1.63mh    Recumbent Bike  -- pt declined    Nustep  -- pt declined      Lumbar Exercises: Supine   Ab Set  5 reps;5 seconds    Clam  10 reps;Limitations with ab set    Clam Limitations  some reported pain in Lt  low back with Lt clam.     Bent Knee Raise  10 reps;5 seconds;Limitations with ab set    Bent Knee Raise Limitations  VC to breathe, VC to count- improved with demo and cues.     Bridge  -- held      Lumbar Exercises: Sidelying   Clam  Right;5 reps;Limitations VC for core engagement, tactile cues for form    Clam Limitations  Pt had pain in Rt low back with each rep; stopped.       Moist Heat Therapy   Number Minutes Moist Heat  20 Minutes    Moist Heat Location  Lumbar Spine;Hip Rt      Electrical Stimulation   Electrical Stimulation Location  Rt lumbar/QL/piriformis/glut med     Printmaker Action  IFC    Electrical Stimulation Parameters  to tolerance     Electrical Stimulation Goals  Pain;Tone      Manual Therapy   Manual Therapy  Muscle Energy Technique;Soft tissue mobilization    Soft tissue mobilization  STM to Rt glute, pirirformis     Muscle Energy Technique  MET to correct Rt sacral torsion in prone.                   PT Long Term Goals - 09/27/17 0820      PT LONG TERM GOAL #1   Title  Improve core strength  and stability allowing patient to perform lumbar stabilization exercises thus improving functional abilities 10/28/17    Time  6    Period  Weeks    Status  On-going      PT LONG TERM GOAL #2   Title  Increase LE strength to 5-/5 to 5/5 10/28/17    Time  6    Period  Weeks    Status  On-going      PT LONG TERM GOAL #3   Title  Decrease pain and episodes of flare up of LBP with patient reporting no more than 2-4/10 pain level with episodes of pain no more frequent than every 2-4 weeks 10/28/17    Time  6    Period  Weeks    Status  On-going      PT LONG TERM GOAL #4   Title  Independent in HEP 10/28/17    Time  6    Period  Weeks    Status  On-going      PT LONG TERM GOAL #5   Title  Improve FOTO to </= 35% limitation 10/28/17    Time  6    Period  Weeks    Status  On-going            Plan - 09/27/17 0859    Clinical Impression Statement  Pt had flare up after last session.  She declined to use seated aerobic equip for fear of aggrivating her back more.  She had some increased discomfort in her Rt low back with supine and sidelying clams, despite cues to engage core to stabilize pelvis.  Her pelvis had some asymmetries; improved alignment with one gentle MET correction.  Pt reported reduction in pain with manual therapy and MHP/estim at end of session.  No new goals met since recent flare up.    Rehab Potential  Good    PT Frequency  2x / week    PT Duration  6 weeks    PT Treatment/Interventions  Patient/family education;ADLs/Self Care Home Management;Electrical Stimulation;Cryotherapy;Iontophoresis 48m/ml Dexamethasone;Moist Heat;Ultrasound;Dry needling;Manual techniques;Neuromuscular re-education;Therapeutic  activities;Therapeutic exercise    PT Next Visit Plan  assess pelvic alignment; cont core stabilization    Consulted and Agree with Plan of Care  Patient       Patient will benefit from skilled therapeutic intervention in order to improve the following deficits and  impairments:  Postural dysfunction, Improper body mechanics, Pain, Increased fascial restricitons, Increased muscle spasms, Hypomobility, Decreased mobility, Decreased range of motion, Decreased strength, Decreased activity tolerance  Visit Diagnosis: Acute right-sided low back pain with right-sided sciatica  Muscle weakness (generalized)  Other symptoms and signs involving the musculoskeletal system     Problem List Patient Active Problem List   Diagnosis Date Noted  . Urge incontinence 08/23/2017  . 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/23/2017  . Urinary frequency 08/17/2017  . Overactive bladder 08/17/2017  . Equinus contracture of ankle 07/27/2017  . Hammer toes of both feet 07/27/2017  . Dermatophytosis, nail 07/27/2017  . Calcaneal spur of both feet 07/27/2017  . Dysuria 07/07/2017  . Atypical chest pain 06/10/2017  . Aortic atherosclerosis ([place]) 06/10/2017  . Cardiomegaly 06/10/2017  . Vitreous detachment of right eye 05/12/2017  . 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 Dermatochalasis of both eyelids 04/07/2017  . Posterior vitreous detachment, right eye 04/07/2017  . Acute bilateral low back pain without sciatica 03/09/2017  . 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 Lumbar degenerative disc disease 03/09/2017  . Acute right flank pain 03/07/2017  . Primary osteoarthritis of left knee 03/04/2017  . Chronic pain of left knee 03/04/2017  . Bilateral primary osteoarthritis of knee 12/15/2016  . Bilateral lower extremity edema 12/15/2016  . Allergy to honey bee venom 12/15/2016  . Cyst of right kidney 07/09/2016  . Essential hypertension 02/16/2016  . DM type 2 with diabetic dyslipidemia ([place]) 02/10/2016  . Gastroesophageal reflux disease without esophagitis 02/10/2016  . Breast pain, left 12/30/2015  . 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 Diabetic sensorimotor polyneuropathy ([place]) 10/27/2015  .  Papillary thyroid carcinoma ([place]) 08/09/2014  . Post-menopausal atrophic vaginitis 06/24/2014  . Left thyroid nodule 01/29/2014  . Nodule of left lung 01/21/2014  . Obesity 06/12/2012  . Palpitations 11/29/2011  . Abnormal mammogram of right breast 08/30/2011  . Arthritis of left knee 05/24/2011  . Coronary artery disease involving native coronary artery of native heart with angina pectoris ([place]) 05/24/2011  . History of acute myocardial infarction of inferior wall 05/24/2011   Kerin Perna, PTA 09/27/17 9:02 AM  [place], [place], [address] Phone: [phone]   Fax:  [phone]  Name: Sara Gardner MRN: [id number] Date of Birth: 15-Jan-1951

## 2017-09-29 ENCOUNTER — Ambulatory Visit (INDEPENDENT_AMBULATORY_CARE_PROVIDER_SITE_OTHER): Payer: Medicare Other | Admitting: Physical Therapy

## 2017-09-29 ENCOUNTER — Encounter: Payer: Self-pay | Admitting: Physical Therapy

## 2017-09-29 DIAGNOSIS — M6281 Muscle weakness (generalized): Secondary | ICD-10-CM

## 2017-09-29 DIAGNOSIS — R29898 Other symptoms and signs involving the musculoskeletal system: Secondary | ICD-10-CM | POA: Diagnosis not present

## 2017-09-29 DIAGNOSIS — M5441 Lumbago with sciatica, right side: Secondary | ICD-10-CM | POA: Diagnosis not present

## 2017-09-29 NOTE — Therapy (Signed)
[place], [place], [address] Phone: [phone]   Fax:  [phone]  Physical Therapy Treatment  Patient Details  Name: Sara Gardner MRN: [id number] Date of Birth: 1951/07/01 Referring Provider: Dr. Dianah Field   Encounter Date: 09/29/2017  PT End of Session - 09/29/17 0809    Visit Number  5    Number of Visits  12    Date for PT Re-Evaluation  10/28/17    PT Start Time  0806    PT Stop Time  0900    PT Time Calculation (min)  54 min    Activity Tolerance  Patient tolerated treatment well    Behavior During Therapy  [facility] for tasks assessed/performed       Past Medical History:  Diagnosis Date  . Aortic atherosclerosis ([place]) 06/10/2017  . CAD (coronary artery disease)   . Cancer ([place])   . Cardiomegaly 06/10/2017  . 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 Dermatochalasis of both eyelids 04/07/2017  . Diabetes mellitus without complication ([place])   . Diastolic heart failure, NYHA class 1 ([place])   . GERD (gastroesophageal reflux disease)   . Hypertension   . Lumbar degenerative disc disease 03/09/2017  . Myocardial infarction ([place]) 2005  . Papillary thyroid carcinoma ([place])   . Posterior vitreous detachment, right eye 04/07/2017  . Thyroid disease     History reviewed. No pertinent surgical history.  There were no vitals filed for this visit.  Subjective Assessment - 09/29/17 0809    Subjective  Pt reports she was a little "uncomfortable" Tuesday night, so she used heating pad before bed.  She has cleaning job M-F at office building; she doesn't believe her job affects back much, but it may. She thinks the manual therapy and estim helped last session.      Patient Stated Goals  get rid of the LBP and keep it from coming back     Currently in Pain?  No/denies    Pain Score  0-No pain         [facility] PT Assessment - 09/29/17 0001      Assessment   Medical  Diagnosis  DDD lumbar spine     Referring Provider  Dr. Dianah Field    Onset Date/Surgical Date  08/26/17    Hand Dominance  Right    Next MD Visit  10/10/17      ROM / Strength   AROM / PROM / Strength  Strength      Strength   Strength Assessment Site  Hip    Right/Left Hip  Right;Left    Right Hip ABduction  -- 5-/5    Left Hip ABduction  -- 5-/5      Flexibility   Hamstrings  Rt 85; Lt 90 deg       Palpation   SI assessment   Rt ASIS lower than Lt; Rt sacral torsion in prone.     Palpation comment  Point tender Rt ASIS, piriformis, glute med and QL.        [facility] Adult PT Treatment/Exercise - 09/29/17 0001      Self-Care   Self-Care  Other Self-Care Comments;Posture    Posture  Pt educated on posture and body mechanics with cleaning tasks such as vacuuming and bending to collect trash/cleaning toilets.  Demo and VC provided.  Pt returned demo with improved posture.     Other Self-Care Comments   Pt educated on self massage with  ball to low back and glutes to decrease pain and tightness; pt returned demo and verbalized understanding.        Lumbar Exercises: Stretches   Passive Hamstring Stretch  Right;Left;3 reps 45 seconds    Standing Extension  1 rep;10 seconds      Lumbar Exercises: Aerobic   Tread Mill  6.5 min @ up to 1.3 mph      Lumbar Exercises: Supine   Bridge  5 reps      Moist Heat Therapy   Number Minutes Moist Heat  15 Minutes    Moist Heat Location  Lumbar Spine;Hip Rt      Electrical Stimulation   Electrical Stimulation Location  Rt lumbar/QL/piriformis/glut med     Chartered certified accountant  IFC    Electrical Stimulation Parameters  to tolerance    Electrical Stimulation Goals  Tone;Pain      Manual Therapy   Soft tissue mobilization  STM to Rt glute, pirirformis, lumbar paraspinals.     Muscle Energy Technique  MET to correct Rt sacral torsion in prone; MET correction for ant rotated Rt ilium with contract/relax of Rt hamstring x3 reps, 2  sets.                   PT Long Term Goals - 09/29/17 0819      PT LONG TERM GOAL #1   Title  Improve core strength and stability allowing patient to perform lumbar stabilization exercises thus improving functional abilities 10/28/17    Time  6    Period  Weeks    Status  On-going      PT LONG TERM GOAL #2   Title  Increase LE strength to 5-/5 to 5/5 10/28/17    Time  6    Period  Weeks    Status  Partially Met      PT LONG TERM GOAL #3   Title  Decrease pain and episodes of flare up of LBP with patient reporting no more than 2-4/10 pain level with episodes of pain no more frequent than every 2-4 weeks 10/28/17    Time  6    Period  Weeks    Status  On-going improving      PT LONG TERM GOAL #4   Title  Independent in HEP 10/28/17    Time  6    Period  Weeks    Status  On-going      PT LONG TERM GOAL #5   Title  Improve FOTO to </= 35% limitation 10/28/17    Time  6    Period  Weeks    Status  On-going            Plan - 09/29/17 6754    Clinical Impression Statement  Pt's back has calmed down since last session.  She reports 50% improvement since initiating therapy.  Pt's pelvis had similar asymmetries to last session; gentle MET corrections performed for improved pelvis alignment.  Pt educated on self massage and body mechanics to assist in reduction of reinjury.  Her hip strength has improved; has partiallly met LTG2.     Rehab Potential  Good    PT Frequency  2x / week    PT Duration  6 weeks    PT Treatment/Interventions  Patient/family education;ADLs/Self Care Home Management;Electrical Stimulation;Cryotherapy;Iontophoresis '4mg'$ /ml Dexamethasone;Moist Heat;Ultrasound;Dry needling;Manual techniques;Neuromuscular re-education;Therapeutic activities;Therapeutic exercise    PT Next Visit Plan  continue core stabilization with body mechanics.      Consulted  and Agree with Plan of Care  Patient       Patient will benefit from skilled therapeutic intervention in  order to improve the following deficits and impairments:  Postural dysfunction, Improper body mechanics, Pain, Increased fascial restricitons, Increased muscle spasms, Hypomobility, Decreased mobility, Decreased range of motion, Decreased strength, Decreased activity tolerance  Visit Diagnosis: Acute right-sided low back pain with right-sided sciatica  Muscle weakness (generalized)  Other symptoms and signs involving the musculoskeletal system     Problem List Patient Active Problem List   Diagnosis Date Noted  . Urge incontinence 08/23/2017  . 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/23/2017  . Urinary frequency 08/17/2017  . Overactive bladder 08/17/2017  . Equinus contracture of ankle 07/27/2017  . Hammer toes of both feet 07/27/2017  . Dermatophytosis, nail 07/27/2017  . Calcaneal spur of both feet 07/27/2017  . Dysuria 07/07/2017  . Atypical chest pain 06/10/2017  . Aortic atherosclerosis ([place]) 06/10/2017  . Cardiomegaly 06/10/2017  . Vitreous detachment of right eye 05/12/2017  . 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 Dermatochalasis of both eyelids 04/07/2017  . Posterior vitreous detachment, right eye 04/07/2017  . Acute bilateral low back pain without sciatica 03/09/2017  . 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 Lumbar degenerative disc disease 03/09/2017  . Acute right flank pain 03/07/2017  . Primary osteoarthritis of left knee 03/04/2017  . Chronic pain of left knee 03/04/2017  . Bilateral primary osteoarthritis of knee 12/15/2016  . Bilateral lower extremity edema 12/15/2016  . Allergy to honey bee venom 12/15/2016  . Cyst of right kidney 07/09/2016  . Essential hypertension 02/16/2016  . DM type 2 with diabetic dyslipidemia ([place]) 02/10/2016  . Gastroesophageal reflux disease without esophagitis 02/10/2016  . Breast pain, left 12/30/2015  . 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 Diabetic sensorimotor  polyneuropathy ([place]) 10/27/2015  . Papillary thyroid carcinoma ([place]) 08/09/2014  . Post-menopausal atrophic vaginitis 06/24/2014  . Left thyroid nodule 01/29/2014  . Nodule of left lung 01/21/2014  . Obesity 06/12/2012  . Palpitations 11/29/2011  . Abnormal mammogram of right breast 08/30/2011  . Arthritis of left knee 05/24/2011  . Coronary artery disease involving native coronary artery of native heart with angina pectoris ([place]) 05/24/2011  . History of acute myocardial infarction of inferior wall 05/24/2011     Kerin Perna, PTA 09/29/17 9:30 AM  [place], [place], [address] Phone: [phone]   Fax:  [phone]  Name: ANNE-MARIE GENSON MRN: [id number] Date of Birth: 1950-09-22

## 2017-10-04 ENCOUNTER — Encounter: Payer: Self-pay | Admitting: Physical Therapy

## 2017-10-04 ENCOUNTER — Ambulatory Visit (INDEPENDENT_AMBULATORY_CARE_PROVIDER_SITE_OTHER): Payer: Medicare Other | Admitting: Physical Therapy

## 2017-10-04 DIAGNOSIS — R29898 Other symptoms and signs involving the musculoskeletal system: Secondary | ICD-10-CM | POA: Diagnosis not present

## 2017-10-04 DIAGNOSIS — M5441 Lumbago with sciatica, right side: Secondary | ICD-10-CM | POA: Diagnosis not present

## 2017-10-04 DIAGNOSIS — M6281 Muscle weakness (generalized): Secondary | ICD-10-CM | POA: Diagnosis not present

## 2017-10-04 NOTE — Therapy (Signed)
[place], [place], [address] Phone: [phone]   Fax:  [phone]  Physical Therapy Treatment  Patient Details  Name: Sara Gardner MRN: [id number] Date of Birth: Feb 18, 1951 Referring Provider: Dr. Dianah Field   Encounter Date: 10/04/2017  PT End of Session - 10/04/17 0931    Visit Number  6    Number of Visits  12    Date for PT Re-Evaluation  10/28/17    PT Start Time  0849    PT Stop Time  0944    PT Time Calculation (min)  55 min    Activity Tolerance  Patient tolerated treatment well;No increased pain    Behavior During Therapy  [facility] for tasks assessed/performed       Past Medical History:  Diagnosis Date  . Aortic atherosclerosis ([place]) 06/10/2017  . CAD (coronary artery disease)   . Cancer ([place])   . Cardiomegaly 06/10/2017  . 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 Dermatochalasis of both eyelids 04/07/2017  . Diabetes mellitus without complication ([place])   . Diastolic heart failure, NYHA class 1 ([place])   . GERD (gastroesophageal reflux disease)   . Hypertension   . Lumbar degenerative disc disease 03/09/2017  . Myocardial infarction ([place]) 2005  . Papillary thyroid carcinoma ([place])   . Posterior vitreous detachment, right eye 04/07/2017  . Thyroid disease     History reviewed. No pertinent surgical history.  There were no vitals filed for this visit.  Subjective Assessment - 10/04/17 1234    Subjective  Pt reports she has no pain this morning.  She has been mindful of her body mechanics at work. She has been stretching and doing her exercises.      Patient Stated Goals  get rid of the LBP and keep it from coming back     Currently in Pain?  No/denies         [facility] PT Assessment - 10/04/17 0001      Assessment   Medical Diagnosis  DDD lumbar spine     Referring Provider  Dr. Dianah Field    Onset Date/Surgical Date  08/26/17    Hand Dominance   Right    Next MD Visit  10/10/17        [facility] Adult PT Treatment/Exercise - 10/04/17 0001      Lumbar Exercises: Stretches   Passive Hamstring Stretch  Right;Left;3 reps 45 seconds, seated    Standing Extension  1 rep;10 seconds    Quad Stretch  Right;Left;3 reps;30 seconds Seated, foot under chair.       Lumbar Exercises: Aerobic   Tread Mill  5 min @ up to 1.5 mph      Lumbar Exercises: Standing   Other Standing Lumbar Exercises  Standing Rt/Lt shoulder flexion with green band (simulating pushing vacuum) with stepping forward/backward, core engaged.       Lumbar Exercises: Seated   Other Seated Lumbar Exercises  core engagement with hands pressing into thighs x 10 sec x 10 reps; seated marching with core engaged x 10 reps with cues for breath.  single clams with red band on thighs, core engaged x 10 reps each leg.   double clam with black band and core engaged x 5 reps.       Lumbar Exercises: Supine   Other Supine Lumbar Exercises         Moist Heat Therapy   Number Minutes Moist Heat  15 Minutes  Moist Heat Location  Lumbar Spine;Hip Rt      Electrical Stimulation   Electrical Stimulation Location  posterior Rt hip    Electrical Stimulation Action  IFC    Electrical Stimulation Parameters  to tolerance    Electrical Stimulation Goals  Tone;Pain             PT Education - 10/04/17 1233    Education provided  Yes    Education Details  TENS info    Person(s) Educated  Patient    Methods  Explanation;Handout    Comprehension  Verbalized understanding          PT Long Term Goals - 10/04/17 0931      PT LONG TERM GOAL #1   Title  Improve core strength and stability allowing patient to perform lumbar stabilization exercises thus improving functional abilities 10/28/17    Time  6    Period  Weeks    Status  On-going Improving       PT LONG TERM GOAL #2   Title  Increase LE strength to 5-/5 to 5/5 10/28/17    Time  6    Period  Weeks    Status  Partially Met       PT LONG TERM GOAL #3   Title  Decrease pain and episodes of flare up of LBP with patient reporting no more than 2-4/10 pain level with episodes of pain no more frequent than every 2-4 weeks 10/28/17    Time  6    Period  Weeks    Status  On-going improving       PT LONG TERM GOAL #4   Title  Independent in HEP 10/28/17    Time  6    Period  Weeks    Status  On-going      PT LONG TERM GOAL #5   Title  Improve FOTO to </= 35% limitation 10/28/17    Time  6    Period  Weeks    Status  Achieved            Plan - 10/04/17 1245    Clinical Impression Statement  Pt has had positive response to exercises. She reports changes in her body mechanics at work have helped as well.  Gently progressed seated core work today.  Her FOTO score has improved to 31%; she has met her FOTO goal.  Pt making good gains towards remaining goals.     Rehab Potential  Good    PT Frequency  2x / week    PT Duration  6 weeks    PT Treatment/Interventions  Patient/family education;ADLs/Self Care Home Management;Electrical Stimulation;Cryotherapy;Iontophoresis 69m/ml Dexamethasone;Moist Heat;Ultrasound;Dry needling;Manual techniques;Neuromuscular re-education;Therapeutic activities;Therapeutic exercise    PT Next Visit Plan  continue core stabilization with body mechanics.      Consulted and Agree with Plan of Care  Patient       Patient will benefit from skilled therapeutic intervention in order to improve the following deficits and impairments:  Postural dysfunction, Improper body mechanics, Pain, Increased fascial restricitons, Increased muscle spasms, Hypomobility, Decreased mobility, Decreased range of motion, Decreased strength, Decreased activity tolerance  Visit Diagnosis: Acute right-sided low back pain with right-sided sciatica  Muscle weakness (generalized)  Other symptoms and signs involving the musculoskeletal system     Problem List Patient Active Problem List   Diagnosis Date Noted  .  Urge incontinence 08/23/2017  . 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/23/2017  . Urinary frequency 08/17/2017  . Overactive  bladder 08/17/2017  . Equinus contracture of ankle 07/27/2017  . Hammer toes of both feet 07/27/2017  . Dermatophytosis, nail 07/27/2017  . Calcaneal spur of both feet 07/27/2017  . Dysuria 07/07/2017  . Atypical chest pain 06/10/2017  . Aortic atherosclerosis ([place]) 06/10/2017  . Cardiomegaly 06/10/2017  . Vitreous detachment of right eye 05/12/2017  . 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 Dermatochalasis of both eyelids 04/07/2017  . Posterior vitreous detachment, right eye 04/07/2017  . Acute bilateral low back pain without sciatica 03/09/2017  . 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 Lumbar degenerative disc disease 03/09/2017  . Acute right flank pain 03/07/2017  . Primary osteoarthritis of left knee 03/04/2017  . Chronic pain of left knee 03/04/2017  . Bilateral primary osteoarthritis of knee 12/15/2016  . Bilateral lower extremity edema 12/15/2016  . Allergy to honey bee venom 12/15/2016  . Cyst of right kidney 07/09/2016  . Essential hypertension 02/16/2016  . DM type 2 with diabetic dyslipidemia ([place]) 02/10/2016  . Gastroesophageal reflux disease without esophagitis 02/10/2016  . Breast pain, left 12/30/2015  . 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 Diabetic sensorimotor polyneuropathy ([place]) 10/27/2015  . Papillary thyroid carcinoma ([place]) 08/09/2014  . Post-menopausal atrophic vaginitis 06/24/2014  . Left thyroid nodule 01/29/2014  . Nodule of left lung 01/21/2014  . Obesity 06/12/2012  . Palpitations 11/29/2011  . Abnormal mammogram of right breast 08/30/2011  . Arthritis of left knee 05/24/2011  . Coronary artery disease involving native coronary artery of native heart with angina pectoris ([place]) 05/24/2011  . History of acute myocardial infarction of inferior wall 05/24/2011    Kerin Perna, PTA 10/04/17 1:07 PM  [facility] [place], [place], [address] Phone: [phone]   Fax:  [phone]  Name: Sara Gardner MRN: [id number] Date of Birth: 04-11-1951

## 2017-10-04 NOTE — Patient Instructions (Signed)
TENS UNIT: This is helpful for muscle pain and spasm.   Search and Purchase a TENS 7000 2nd edition at [employer] It should be less than $30.     TENS unit instructions: Do not shower or bathe with the unit on Turn the unit off before removing electrodes or batteries If the electrodes lose stickiness add a drop of water to the electrodes after they are disconnected from the unit and place on plastic sheet. If you continued to have difficulty, call the TENS unit company to purchase more electrodes. Do not apply lotion on the skin area prior to use. Make sure the skin is clean and dry as this will help prolong the life of the electrodes. After use, always check skin for unusual red areas, rash or other skin difficulties. If there are any skin problems, does not apply electrodes to the same area. Never remove the electrodes from the unit by pulling the wires. Do not use the TENS unit or electrodes other than as directed. Do not change electrode placement without consultating your therapist or physician. Keep 2 fingers with between each electrode. Wear time ratio is 2:1, on to off times.    For example on for 30 minutes off for 15 minutes and then on for 30 minutes off for 15 minutes   

## 2017-10-06 ENCOUNTER — Ambulatory Visit: Payer: Medicare Other | Admitting: Sports Medicine

## 2017-10-06 ENCOUNTER — Encounter: Payer: Medicare Other | Admitting: Rehabilitative and Restorative Service Providers"

## 2017-10-06 ENCOUNTER — Other Ambulatory Visit: Payer: Self-pay | Admitting: Physician Assistant

## 2017-10-06 LAB — HM COLONOSCOPY

## 2017-10-07 ENCOUNTER — Encounter: Payer: Medicare Other | Admitting: Rehabilitative and Restorative Service Providers"

## 2017-10-10 ENCOUNTER — Ambulatory Visit (INDEPENDENT_AMBULATORY_CARE_PROVIDER_SITE_OTHER): Payer: Medicare Other | Admitting: Sports Medicine

## 2017-10-10 ENCOUNTER — Encounter: Payer: Self-pay | Admitting: Sports Medicine

## 2017-10-10 DIAGNOSIS — M4807 Spinal stenosis, lumbosacral region: Secondary | ICD-10-CM

## 2017-10-10 DIAGNOSIS — M5136 Other intervertebral disc degeneration, lumbar region: Secondary | ICD-10-CM

## 2017-10-10 DIAGNOSIS — M51369 Other intervertebral disc degeneration, lumbar region without mention of lumbar back pain or lower extremity pain: Secondary | ICD-10-CM

## 2017-10-10 NOTE — Progress Notes (Signed)
  Subjective:    CC: Follow-up  HPI: Low back pain: Consistent with spinal stenosis, significant improvement with physical therapy, steroids, NSAIDs.  About 50% better now.  No bowel or bladder dysfunction, saddle numbness, constitutional symptoms.  I reviewed the past medical history, family history, social history, surgical history, and allergies today and no changes were needed.  Please see the problem list section below in epic for further details.  Past Medical History: Past Medical History:  Diagnosis Date  . Aortic atherosclerosis ([place]) 06/10/2017  . CAD (coronary artery disease)   . Cancer ([place])   . Cardiomegaly 06/10/2017  . 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 Dermatochalasis of both eyelids 04/07/2017  . Diabetes mellitus without complication ([place])   . Diastolic heart failure, NYHA class 1 ([place])   . GERD (gastroesophageal reflux disease)   . Hypertension   . Lumbar degenerative disc disease 03/09/2017  . Myocardial infarction ([place]) 2005  . Papillary thyroid carcinoma ([place])   . Posterior vitreous detachment, right eye 04/07/2017  . Thyroid disease    Past Surgical History: No past surgical history on file. Social History: Social History   Socioeconomic History  . Marital status: Unknown    Spouse name: None  . Number of children: None  . Years of education: None  . Highest education level: None  Social Needs  . Financial resource strain: None  . Food insecurity - worry: None  . Food insecurity - inability: None  . Transportation needs - medical: None  . Transportation needs - non-medical: None  Occupational History  . None  Tobacco Use  . Smoking status: Never Smoker  . Smokeless tobacco: Never Used  Substance and Sexual Activity  . Alcohol use: No  . Drug use: No  . Sexual activity: Not Currently  Other Topics Concern  . None  Social History Narrative  . None   Family History: No family history on  file. Allergies: Allergies  Allergen Reactions  . Bee Venom Swelling  . Tuberculin Tests Other (See Comments)  . Tape Rash   Medications: See med rec.  Review of Systems: No fevers, chills, night sweats, weight loss, chest pain, or shortness of breath.   Objective:    General: Well Developed, well nourished, and in no acute distress.  Neuro: Alert and oriented x3, extra-ocular muscles intact, sensation grossly intact.  HEENT: Normocephalic, atraumatic, pupils equal round reactive to light, neck supple, no masses, no lymphadenopathy, thyroid nonpalpable.  Skin: Warm and dry, no rashes. Cardiac: Regular rate and rhythm, no murmurs rubs or gallops, no lower extremity edema.  Respiratory: Clear to auscultation bilaterally. Not using accessory muscles, speaking in full sentences.  Impression and Recommendations:    Lumbar degenerative disc disease Symptoms consistent with lumbar spinal stenosis. We discussed the pathology and anthropology of degenerative disc disease. She has improved 50% with physical therapy, prednisone, meloxicam. Adding an MRI for interventional planning, she did have some right sacroiliac joint pain today which could be a interventional target at the follow-up visit. I do ultimately however think that she will need a lumbar epidural. Return to go over MRI results.  I spent 25 minutes with this patient, greater than 50% was face-to-face time counseling regarding the above diagnoses ___________________________________________ Gwen Her. Dianah Field, M.D., ABFM., CAQSM. Primary Care and [place] Instructor of [place] [employer] [facility] of [employer]

## 2017-10-10 NOTE — Assessment & Plan Note (Signed)
Symptoms consistent with lumbar spinal stenosis. We discussed the pathology and anthropology of degenerative disc disease. She has improved 50% with physical therapy, prednisone, meloxicam. Adding an MRI for interventional planning, she did have some right sacroiliac joint pain today which could be a interventional target at the follow-up visit. I do ultimately however think that she will need a lumbar epidural. Return to go over MRI results.

## 2017-10-11 ENCOUNTER — Ambulatory Visit (INDEPENDENT_AMBULATORY_CARE_PROVIDER_SITE_OTHER): Payer: Medicare Other | Admitting: Physical Therapy

## 2017-10-11 DIAGNOSIS — R29898 Other symptoms and signs involving the musculoskeletal system: Secondary | ICD-10-CM

## 2017-10-11 DIAGNOSIS — M5441 Lumbago with sciatica, right side: Secondary | ICD-10-CM | POA: Diagnosis not present

## 2017-10-11 DIAGNOSIS — M6281 Muscle weakness (generalized): Secondary | ICD-10-CM | POA: Diagnosis not present

## 2017-10-11 NOTE — Therapy (Signed)
[place], [place], [address] Phone: [phone]   Fax:  [phone]  Physical Therapy Treatment  Patient Details  Name: Sara Gardner MRN: [id number] Date of Birth: 02/13/1951 Referring Provider: Dr. Dianah Field   Encounter Date: 10/11/2017  PT End of Session - 10/11/17 0823    Visit Number  7    Number of Visits  12    Date for PT Re-Evaluation  10/28/17    PT Start Time  0810 pt arrived late    PT Stop Time  0903    PT Time Calculation (min)  53 min    Activity Tolerance  Patient tolerated treatment well;No increased pain    Behavior During Therapy  WFL for tasks assessed/performed       Past Medical History:  Diagnosis Date  . Aortic atherosclerosis ([facility] [facility]) 06/10/2017  . CAD (coronary artery disease)   . Cancer ([place])   . Cardiomegaly 06/10/2017  . 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 Dermatochalasis of both eyelids 04/07/2017  . Diabetes mellitus without complication ([place])   . Diastolic heart failure, NYHA class 1 ([facility] [place] [facility])   . GERD (gastroesophageal reflux disease)   . Hypertension   . Lumbar degenerative disc disease 03/09/2017  . Myocardial infarction ([place]) 2005  . Papillary thyroid carcinoma ([place])   . Posterior vitreous detachment, right eye 04/07/2017  . Thyroid disease     No past surgical history on file.  There were no vitals filed for this visit.  Subjective Assessment - 10/11/17 0820    Subjective  Pt reports she had a lot more work 2 days ago; she was more sore in Rt hip yesterday.  She feels she is still recovering from her colonoscopy last wk. She is reporting 50% improvement in symptoms and is no longer feeling knee pain.     Patient Stated Goals  get rid of the LBP and keep it from coming back     Currently in Pain?  No/denies    Pain Score  0-No pain         [facility] PT Assessment - 10/11/17 0001      Assessment   Medical Diagnosis  DDD  lumbar spine     Referring Provider  Dr. Dianah Field    Onset Date/Surgical Date  08/26/17    Hand Dominance  Right      Strength   Right/Left Knee  Right;Left    Right Knee Extension  5/5    Left Knee Extension  5/5      Palpation   SI assessment   ASIS appear level in supine       [facility] Adult PT Treatment/Exercise - 10/11/17 0001      Lumbar Exercises: Stretches   Passive Hamstring Stretch  Right;Left;4 reps 45 seconds, seated    Quad Stretch Piriformis stretch   Right;Left;4  reps;30 seconds Seated, foot under chair.   Right; Left 4 reps, 45 sec each      Lumbar Exercises: Aerobic   Tread Mill  5 min @ 1.1-1.3 mph      Lumbar Exercises: Supine   Bridge  10 reps;5 seconds VC to breathe during exertion.       Lumbar Exercises: Sidelying   Clam  Right;Left - 10 reps, VC for technique.      Moist Heat Therapy   Number Minutes Moist Heat  15 Minutes    Moist Heat Location  Lumbar Spine;Hip Rt  Acupuncturist Location  posterior Rt hip /low back   Electrical Stimulation Action  IFC    Electrical Stimulation Parameters  to tolerance     Electrical Stimulation Goals  Pain;Tone      Manual Therapy   Soft tissue mobilization  STM to Rt glute, pirirformis, lumbar paraspinals.                   PT Long Term Goals - 10/11/17 0821      PT LONG TERM GOAL #1   Title  Improve core strength and stability allowing patient to perform lumbar stabilization exercises thus improving functional abilities 10/28/17    Time  6    Period  Weeks    Status  On-going      PT LONG TERM GOAL #2   Title  Increase LE strength to 5-/5 to 5/5 10/28/17    Time  6    Period  Weeks    Status  Achieved      PT LONG TERM GOAL #3   Title  Decrease pain and episodes of flare up of LBP with patient reporting no more than 2-4/10 pain level with episodes of pain no more frequent than every 2-4 weeks 10/28/17    Time  6    Period  Weeks    Status   On-going      PT LONG TERM GOAL #4   Title  Independent in HEP 10/28/17    Time  6    Period  Weeks    Status  On-going      PT LONG TERM GOAL #5   Title  Improve FOTO to </= 35% limitation 10/28/17    Time  6    Period  Weeks    Status  Achieved            Plan - 10/11/17 0848    Clinical Impression Statement  Pt no longer having knee pain; stretches have resolved this.  She demonstrated improved LE strength; has met LTG#2.  She tolerated all exercises well, req minor cues for not holding breath.  She was able to tolerate clams without back pain today.  PRogressing towards goals.     PT Frequency  2x / week    PT Duration  6 weeks    PT Treatment/Interventions  Patient/family education;ADLs/Self Care Home Management;Electrical Stimulation;Cryotherapy;Iontophoresis 87m/ml Dexamethasone;Moist Heat;Ultrasound;Dry needling;Manual techniques;Neuromuscular re-education;Therapeutic activities;Therapeutic exercise    PT Next Visit Plan  Possible DN to Rt hip/low back.  continue  gentle spine stabilization exercises.  (No NuStep/Bike as pt had flare up with use)    Consulted and Agree with Plan of Care  Patient       Patient will benefit from skilled therapeutic intervention in order to improve the following deficits and impairments:  Postural dysfunction, Improper body mechanics, Pain, Increased fascial restricitons, Increased muscle spasms, Hypomobility, Decreased mobility, Decreased range of motion, Decreased strength, Decreased activity tolerance  Visit Diagnosis: Acute right-sided low back pain with right-sided sciatica  Muscle weakness (generalized)  Other symptoms and signs involving the musculoskeletal system     Problem List Patient Active Problem List   Diagnosis Date Noted  . Urge incontinence 08/23/2017  . 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/23/2017  . Urinary frequency 08/17/2017  . Overactive bladder 08/17/2017  . Equinus contracture of ankle  07/27/2017  . Hammer toes of both feet 07/27/2017  . Dermatophytosis, nail 07/27/2017  . Calcaneal spur of both feet 07/27/2017  .  Dysuria 07/07/2017  . Atypical chest pain 06/10/2017  . Aortic atherosclerosis ([place]) 06/10/2017  . Cardiomegaly 06/10/2017  . Vitreous detachment of right eye 05/12/2017  . 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 Dermatochalasis of both eyelids 04/07/2017  . Posterior vitreous detachment, right eye 04/07/2017  . Acute bilateral low back pain without sciatica 03/09/2017  . 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 Lumbar degenerative disc disease 03/09/2017  . Acute right flank pain 03/07/2017  . Primary osteoarthritis of left knee 03/04/2017  . Chronic pain of left knee 03/04/2017  . Bilateral primary osteoarthritis of knee 12/15/2016  . Bilateral lower extremity edema 12/15/2016  . Allergy to honey bee venom 12/15/2016  . Cyst of right kidney 07/09/2016  . Essential hypertension 02/16/2016  . DM type 2 with diabetic dyslipidemia ([facility] [facility]) 02/10/2016  . Gastroesophageal reflux disease without esophagitis 02/10/2016  . Breast pain, left 12/30/2015  . 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 Diabetic sensorimotor polyneuropathy ([facility]) 10/27/2015  . Papillary thyroid carcinoma ([facility] [place]) 08/09/2014  . Post-menopausal atrophic vaginitis 06/24/2014  . Left thyroid nodule 01/29/2014  . Nodule of left lung 01/21/2014  . Obesity 06/12/2012  . Palpitations 11/29/2011  . Abnormal mammogram of right breast 08/30/2011  . Arthritis of left knee 05/24/2011  . Coronary artery disease involving native coronary artery of native heart with angina pectoris ([facility]) 05/24/2011  . History of acute myocardial infarction of inferior wall 05/24/2011   Kerin Perna, PTA 10/11/17 9:15 AM  [place], [place], [address] Phone: [phone]   Fax:   [phone]  Name: CILICIA BORDEN MRN: [id number] Date of Birth: [birth year]

## 2017-10-14 ENCOUNTER — Encounter: Payer: Self-pay | Admitting: Rehabilitative and Restorative Service Providers"

## 2017-10-14 ENCOUNTER — Ambulatory Visit (INDEPENDENT_AMBULATORY_CARE_PROVIDER_SITE_OTHER): Payer: Medicare Other | Admitting: Rehabilitative and Restorative Service Providers"

## 2017-10-14 DIAGNOSIS — R29898 Other symptoms and signs involving the musculoskeletal system: Secondary | ICD-10-CM | POA: Diagnosis not present

## 2017-10-14 DIAGNOSIS — M6281 Muscle weakness (generalized): Secondary | ICD-10-CM | POA: Diagnosis not present

## 2017-10-14 DIAGNOSIS — M5441 Lumbago with sciatica, right side: Secondary | ICD-10-CM | POA: Diagnosis not present

## 2017-10-14 NOTE — Therapy (Signed)
[place], [place], [address] Phone: [phone]   Fax:  [phone]  Physical Therapy Treatment  Patient Details  Name: Sara Gardner MRN: [id number] Date of Birth: Mar 28, 1951 Referring Provider: Dr. Dianah Field   Encounter Date: 10/14/2017  PT End of Session - 10/14/17 0806    Visit Number  8    Number of Visits  12    Date for PT Re-Evaluation  10/28/17    PT Start Time  0804    PT Stop Time  0903    PT Time Calculation (min)  59 min    Activity Tolerance  Patient tolerated treatment well       Past Medical History:  Diagnosis Date  . Aortic atherosclerosis ([facility]) 06/10/2017  . CAD (coronary artery disease)   . Cancer ([place])   . Cardiomegaly 06/10/2017  . 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 Dermatochalasis of both eyelids 04/07/2017  . Diabetes mellitus without complication ([facility])   . Diastolic heart failure, NYHA class 1 ([facility])   . GERD (gastroesophageal reflux disease)   . Hypertension   . Lumbar degenerative disc disease 03/09/2017  . Myocardial infarction ([place] [facility]) 2005  . Papillary thyroid carcinoma ([place])   . Posterior vitreous detachment, right eye 04/07/2017  . Thyroid disease     History reviewed. No pertinent surgical history.  There were no vitals filed for this visit.  Subjective Assessment - 10/14/17 0806    Subjective  Walked out of therapy last time feeling okay but started hurting that same day and has been hurting since. The Rt hip is hurting - may have been from the massage. Ususally doesn't bother her but it was different last visit. Wants to try the dry needling.     Currently in Pain?  Yes    Pain Score  4     Pain Location  Back    Pain Orientation  Right    Pain Descriptors / Indicators  Aching    Pain Type  Chronic pain                      [facility] Adult PT Treatment/Exercise - 10/14/17 0001      Lumbar Exercises:  Stretches   Piriformis Stretch  30 seconds;4 reps supine travell       Lumbar Exercises: Aerobic   Tread Mill  5 min @ 1.1-1.3 mph      Ultrasound   Ultrasound Location  Rt posterior hip     Ultrasound Parameters  1.5 w/cm2; 1 mHz; 100%; 8 min     Ultrasound Goals  Pain;Other (Comment) tightness       Manual Therapy   Soft tissue mobilization  gentle STM Rt posterior hip through the piriformis and gluts        Trigger Point Dry Needling - 10/14/17 0846    Consent Given?  Yes    Education Handout Provided  Yes    Gluteus Maximus Response  Palpable increased muscle length    Piriformis Response  Palpable increased muscle length           PT Education - 10/14/17 0849    Education provided  Yes    Education Details  DN    Person(s) Educated  Patient    Methods  Explanation    Comprehension  Verbalized understanding          PT Long Term Goals - 10/11/17 0102  PT LONG TERM GOAL #1   Title  Improve core strength and stability allowing patient to perform lumbar stabilization exercises thus improving functional abilities 10/28/17    Time  6    Period  Weeks    Status  On-going      PT LONG TERM GOAL #2   Title  Increase LE strength to 5-/5 to 5/5 10/28/17    Time  6    Period  Weeks    Status  Achieved      PT LONG TERM GOAL #3   Title  Decrease pain and episodes of flare up of LBP with patient reporting no more than 2-4/10 pain level with episodes of pain no more frequent than every 2-4 weeks 10/28/17    Time  6    Period  Weeks    Status  On-going      PT LONG TERM GOAL #4   Title  Independent in HEP 10/28/17    Time  6    Period  Weeks    Status  On-going      PT LONG TERM GOAL #5   Title  Improve FOTO to </= 35% limitation 10/28/17    Time  6    Period  Weeks    Status  Achieved            Plan - 10/14/17 0847    Clinical Impression Statement  Increased soreness and discomfort with deep tissue work at last visit. Tolerated DN today with no  pistoning or estim. Gentle manual work only. Added [place] to decreased muscular tightness following DN. Added pirifromis stretch back to HEP     Rehab Potential  Good    PT Frequency  2x / week    PT Treatment/Interventions  Patient/family education;ADLs/Self Care Home Management;Electrical Stimulation;Cryotherapy;Iontophoresis 4mg /ml Dexamethasone;Moist Heat;Ultrasound;Dry needling;Manual techniques;Neuromuscular re-education;Therapeutic activities;Therapeutic exercise    PT Next Visit Plan  Assess response to DN to Rt hip.  continue  gentle spine stabilization exercises.  (No NuStep/Bike as pt had flare up with use)    Consulted and Agree with Plan of Care  Patient       Patient will benefit from skilled therapeutic intervention in order to improve the following deficits and impairments:  Postural dysfunction, Improper body mechanics, Pain, Increased fascial restricitons, Increased muscle spasms, Hypomobility, Decreased mobility, Decreased range of motion, Decreased strength, Decreased activity tolerance  Visit Diagnosis: Acute right-sided low back pain with right-sided sciatica  Muscle weakness (generalized)  Other symptoms and signs involving the musculoskeletal system     Problem List Patient Active Problem List   Diagnosis Date Noted  . Urge incontinence 08/23/2017  . 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/23/2017  . Urinary frequency 08/17/2017  . Overactive bladder 08/17/2017  . Equinus contracture of ankle 07/27/2017  . Hammer toes of both feet 07/27/2017  . Dermatophytosis, nail 07/27/2017  . Calcaneal spur of both feet 07/27/2017  . Dysuria 07/07/2017  . Atypical chest pain 06/10/2017  . Aortic atherosclerosis ([facility]) 06/10/2017  . Cardiomegaly 06/10/2017  . Vitreous detachment of right eye 05/12/2017  . 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 Dermatochalasis of both eyelids 04/07/2017  . Posterior vitreous detachment, right eye  04/07/2017  . Acute bilateral low back pain without sciatica 03/09/2017  . 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 Lumbar degenerative disc disease 03/09/2017  . Acute right flank pain 03/07/2017  . Primary osteoarthritis of left knee 03/04/2017  . Chronic pain of left knee  03/04/2017  . Bilateral primary osteoarthritis of knee 12/15/2016  . Bilateral lower extremity edema 12/15/2016  . Allergy to honey bee venom 12/15/2016  . Cyst of right kidney 07/09/2016  . Essential hypertension 02/16/2016  . DM type 2 with diabetic dyslipidemia ([facility]) 02/10/2016  . Gastroesophageal reflux disease without esophagitis 02/10/2016  . Breast pain, left 12/30/2015  . 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 Diabetic sensorimotor polyneuropathy ([facility]) 10/27/2015  . Papillary thyroid carcinoma ([place]) 08/09/2014  . Post-menopausal atrophic vaginitis 06/24/2014  . Left thyroid nodule 01/29/2014  . Nodule of left lung 01/21/2014  . Obesity 06/12/2012  . Palpitations 11/29/2011  . Abnormal mammogram of right breast 08/30/2011  . Arthritis of left knee 05/24/2011  . Coronary artery disease involving native coronary artery of native heart with angina pectoris ([place]) 05/24/2011  . History of acute myocardial infarction of inferior wall 05/24/2011    Gyneth Hubka Nilda Simmer PT, MPH  10/14/2017, 8:51 AM  [facility] [place], [place], [address] Phone: [phone]   Fax:  [phone]  Name: SHAVANNA FURNARI MRN: [id number] Date of Birth: [birth year]

## 2017-10-14 NOTE — Patient Instructions (Signed)

## 2017-10-17 ENCOUNTER — Ambulatory Visit (INDEPENDENT_AMBULATORY_CARE_PROVIDER_SITE_OTHER): Payer: Medicare Other

## 2017-10-17 DIAGNOSIS — M5136 Other intervertebral disc degeneration, lumbar region: Secondary | ICD-10-CM | POA: Diagnosis not present

## 2017-10-17 DIAGNOSIS — M4807 Spinal stenosis, lumbosacral region: Secondary | ICD-10-CM

## 2017-10-18 ENCOUNTER — Encounter: Payer: Medicare Other | Admitting: Physical Therapy

## 2017-10-20 ENCOUNTER — Encounter: Payer: Medicare Other | Admitting: Physical Therapy

## 2017-10-20 ENCOUNTER — Encounter: Payer: Self-pay | Admitting: Physician Assistant

## 2017-10-21 ENCOUNTER — Ambulatory Visit (INDEPENDENT_AMBULATORY_CARE_PROVIDER_SITE_OTHER): Payer: Medicare Other | Admitting: Sports Medicine

## 2017-10-21 ENCOUNTER — Encounter: Payer: Self-pay | Admitting: Sports Medicine

## 2017-10-21 ENCOUNTER — Ambulatory Visit (INDEPENDENT_AMBULATORY_CARE_PROVIDER_SITE_OTHER): Payer: Medicare Other | Admitting: Physical Therapy

## 2017-10-21 DIAGNOSIS — M5441 Lumbago with sciatica, right side: Secondary | ICD-10-CM | POA: Diagnosis not present

## 2017-10-21 DIAGNOSIS — R29898 Other symptoms and signs involving the musculoskeletal system: Secondary | ICD-10-CM | POA: Diagnosis not present

## 2017-10-21 DIAGNOSIS — M5136 Other intervertebral disc degeneration, lumbar region: Secondary | ICD-10-CM | POA: Diagnosis not present

## 2017-10-21 DIAGNOSIS — M6281 Muscle weakness (generalized): Secondary | ICD-10-CM

## 2017-10-21 DIAGNOSIS — M51369 Other intervertebral disc degeneration, lumbar region without mention of lumbar back pain or lower extremity pain: Secondary | ICD-10-CM

## 2017-10-21 NOTE — Progress Notes (Signed)
Subjective:    CC: MRI follow-up  HPI: This is a pleasant 67 year old female here for follow-up of her lumbar spinal stenosis, she is doing extremely well in physical therapy and noting good improvements.  I reviewed the past medical history, family history, social history, surgical history, and allergies today and no changes were needed.  Please see the problem list section below in epic for further details.  Past Medical History: Past Medical History:  Diagnosis Date  . Aortic atherosclerosis ([place]) 06/10/2017  . CAD (coronary artery disease)   . Cancer ([facility])   . Cardiomegaly 06/10/2017  . 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 Dermatochalasis of both eyelids 04/07/2017  . Diabetes mellitus without complication ([place])   . Diastolic heart failure, NYHA class 1 ([facility])   . GERD (gastroesophageal reflux disease)   . Hypertension   . Lumbar degenerative disc disease 03/09/2017  . Myocardial infarction ([facility]) 2005  . Papillary thyroid carcinoma ([place])   . Posterior vitreous detachment, right eye 04/07/2017  . Thyroid disease    Past Surgical History: No past surgical history on file. Social History: Social History   Socioeconomic History  . Marital status: Unknown    Spouse name: Not on file  . Number of children: Not on file  . Years of education: Not on file  . Highest education level: Not on file  Occupational History  . Not on file  Social Needs  . Financial resource strain: Not on file  . Food insecurity:    Worry: Not on file    Inability: Not on file  . Transportation needs:    Medical: Not on file    Non-medical: Not on file  Tobacco Use  . Smoking status: Never Smoker  . Smokeless tobacco: Never Used  Substance and Sexual Activity  . Alcohol use: No  . Drug use: No  . Sexual activity: Not Currently  Lifestyle  . Physical activity:    Days per week: Not on file    Minutes per session: Not on file  . Stress: Not on file  Relationships    . Social connections:    Talks on phone: Not on file    Gets together: Not on file    Attends religious service: Not on file    Active member of club or organization: Not on file    Attends meetings of clubs or organizations: Not on file    Relationship status: Not on file  Other Topics Concern  . Not on file  Social History Narrative  . Not on file   Family History: No family history on file. Allergies: Allergies  Allergen Reactions  . Bee Venom Swelling  . Tuberculin Tests Other (See Comments)  . Tape Rash   Medications: See med rec.  Review of Systems: No fevers, chills, night sweats, weight loss, chest pain, or shortness of breath.   Objective:    General: Well Developed, well nourished, and in no acute distress.  Neuro: Alert and oriented x3, extra-ocular muscles intact, sensation grossly intact.  HEENT: Normocephalic, atraumatic, pupils equal round reactive to light, neck supple, no masses, no lymphadenopathy, thyroid nonpalpable.  Skin: Warm and dry, no rashes. Cardiac: Regular rate and rhythm, no murmurs rubs or gallops, no lower extremity edema.  Respiratory: Clear to auscultation bilaterally. Not using accessory muscles, speaking in full sentences.  MRI reviewed, she does have transitional anatomy with a L6 vertebrae, she has central canal stenosis at L5-L6 on the current numbering scheme as  well as facet arthritis as expected at this level.  Impression and Recommendations:    Lumbar degenerative disc disease Symptoms consistent with lumbar spinal stenosis. Somewhat better with therapy but would like to continue physical therapy for now which I agree with. MRI did show central stenosis, facet arthritis. She also had transitional vertebrae with an L6 vertebrae as well. Continue PT for now, we will discuss epidurals at a future visit if needed. Return to see me in 6 weeks.  I spent 25 minutes with this patient, greater than 50% was face-to-face time counseling  regarding the above diagnoses ___________________________________________ Gwen Her. Dianah Field, M.D., ABFM., CAQSM. Primary Care and [place] Instructor of [place] of [facility] of Medicine

## 2017-10-21 NOTE — Therapy (Signed)
[place], [place], [address] Phone: [phone]   Fax:  [phone]  Physical Therapy Treatment  Patient Details  Name: Sara Gardner MRN: [id number] Date of Birth: 23-Feb-1951 Referring Provider: Dr. Dianah Field   Encounter Date: 10/21/2017  PT End of Session - 10/21/17 0941    Visit Number  9    Number of Visits  12    Date for PT Re-Evaluation  10/28/17    PT Start Time  0932    PT Stop Time  1037    PT Time Calculation (min)  65 min    Activity Tolerance  Patient tolerated treatment well    Behavior During Therapy  [facility] for tasks assessed/performed       Past Medical History:  Diagnosis Date  . Aortic atherosclerosis ([place]) 06/10/2017  . CAD (coronary artery disease)   . Cancer ([place])   . Cardiomegaly 06/10/2017  . 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 Dermatochalasis of both eyelids 04/07/2017  . Diabetes mellitus without complication ([place])   . Diastolic heart failure, NYHA class 1 ([place])   . GERD (gastroesophageal reflux disease)   . Hypertension   . Lumbar degenerative disc disease 03/09/2017  . Myocardial infarction ([place]) 2005  . Papillary thyroid carcinoma ([place])   . Posterior vitreous detachment, right eye 04/07/2017  . Thyroid disease     No past surgical history on file.  There were no vitals filed for this visit.  Subjective Assessment - 10/21/17 0946    Subjective  Pt reports she had relief of pain for a couple of days after dry needling. "I'm not sure why I went backwards again".  She is unsure if it was something she consumed or how she irritated her back.     Patient Stated Goals  get rid of the LBP and keep it from coming back     Currently in Pain?  Yes    Pain Score  7     Pain Location  Back    Pain Orientation  Right;Lower    Pain Descriptors / Indicators  Aching    Pain Radiating Towards  down outside of Rt thigh     Aggravating  Factors   bending over, lifting anything over 10#, quick movement.     Pain Relieving Factors  rest, heating pad.        [facility] Adult PT Treatment/Exercise - 10/21/17 0001      Lumbar Exercises: Stretches   Passive Hamstring Stretch  Right;Left;2 reps;30 seconds    Lower Trunk Rotation  -- gentle rocking back and forth.     Piriformis Stretch  Right;Left;2 reps;30 seconds travell    Other Lumbar Stretch Exercise  lat stretch with arms on back of truck x 20 sec x 3 reps      Lumbar Exercises: Aerobic   Tread Mill  5.5 min @ 1.0 mph      Lumbar Exercises: Supine   Clam  20 reps;2 seconds with ab set and green band at thighs.       Moist Heat Therapy   Number Minutes Moist Heat  20 Minutes    Moist Heat Location  Lumbar Spine;Hip Rt      Electrical Stimulation   Electrical Stimulation Location  Rt lumbar/QL/piriformis/glut med     Printmaker Action  IFC    Electrical Stimulation Parameters  to tolerance    Electrical Stimulation Goals  Pain;Tone  Manual Therapy   Manual therapy comments  pt in supported hooklying and Lt sidelying.     Soft tissue mobilization  gentle STM and TPR to Rt QL, posterior hip through the piriformis and gluts            PT Long Term Goals - 10/11/17 0821      PT LONG TERM GOAL #1   Title  Improve core strength and stability allowing patient to perform lumbar stabilization exercises thus improving functional abilities 10/28/17    Time  6    Period  Weeks    Status  On-going      PT LONG TERM GOAL #2   Title  Increase LE strength to 5-/5 to 5/5 10/28/17    Time  6    Period  Weeks    Status  Achieved      PT LONG TERM GOAL #3   Title  Decrease pain and episodes of flare up of LBP with patient reporting no more than 2-4/10 pain level with episodes of pain no more frequent than every 2-4 weeks 10/28/17    Time  6    Period  Weeks    Status  On-going      PT LONG TERM GOAL #4   Title  Independent in HEP 10/28/17    Time  6     Period  Weeks    Status  On-going      PT LONG TERM GOAL #5   Title  Improve FOTO to </= 35% limitation 10/28/17    Time  6    Period  Weeks    Status  Achieved            Plan - 10/21/17 1027    Clinical Impression Statement  Pt had decreased tolerance for exercise due to increased pain level in Rt hip/ low back.  Exercises kept gentle and focus was on reduction in pain and tissue tightness.  No new goals met due to recent flare up of symptoms. Pt reported reduction of symptoms with stretches and further reduction with use of estim/MHP.      Rehab Potential  Good    PT Frequency  2x / week    PT Duration  6 weeks    PT Treatment/Interventions  Patient/family education;ADLs/Self Care Home Management;Electrical Stimulation;Cryotherapy;Iontophoresis 32m/ml Dexamethasone;Moist Heat;Ultrasound;Dry needling;Manual techniques;Neuromuscular re-education;Therapeutic activities;Therapeutic exercise    PT Next Visit Plan   continue  gentle spine stabilization exercises.  (No NuStep/Bike as pt had flare up with use)    Consulted and Agree with Plan of Care  Patient       Patient will benefit from skilled therapeutic intervention in order to improve the following deficits and impairments:  Postural dysfunction, Improper body mechanics, Pain, Increased fascial restricitons, Increased muscle spasms, Hypomobility, Decreased mobility, Decreased range of motion, Decreased strength, Decreased activity tolerance  Visit Diagnosis: Acute right-sided low back pain with right-sided sciatica  Muscle weakness (generalized)  Other symptoms and signs involving the musculoskeletal system     Problem List Patient Active Problem List   Diagnosis Date Noted  . Urge incontinence 08/23/2017  . 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/23/2017  . Urinary frequency 08/17/2017  . Overactive bladder 08/17/2017  . Equinus contracture of ankle 07/27/2017  . Hammer toes of both feet 07/27/2017  .  Dermatophytosis, nail 07/27/2017  . Calcaneal spur of both feet 07/27/2017  . Dysuria 07/07/2017  . Atypical chest pain 06/10/2017  . Aortic atherosclerosis ([place] 06/10/2017  . Cardiomegaly  06/10/2017  . Vitreous detachment of right eye 05/12/2017  . 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 Dermatochalasis of both eyelids 04/07/2017  . Posterior vitreous detachment, right eye 04/07/2017  . Acute bilateral low back pain without sciatica 03/09/2017  . 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 Lumbar degenerative disc disease 03/09/2017  . Acute right flank pain 03/07/2017  . Primary osteoarthritis of left knee 03/04/2017  . Chronic pain of left knee 03/04/2017  . Bilateral primary osteoarthritis of knee 12/15/2016  . Bilateral lower extremity edema 12/15/2016  . Allergy to honey bee venom 12/15/2016  . Cyst of right kidney 07/09/2016  . Essential hypertension 02/16/2016  . DM type 2 with diabetic dyslipidemia ([place]) 02/10/2016  . Gastroesophageal reflux disease without esophagitis 02/10/2016  . Breast pain, left 12/30/2015  . 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 Diabetic sensorimotor polyneuropathy ([place]) 10/27/2015  . Papillary thyroid carcinoma ([place]) 08/09/2014  . Post-menopausal atrophic vaginitis 06/24/2014  . Left thyroid nodule 01/29/2014  . Nodule of left lung 01/21/2014  . Obesity 06/12/2012  . Palpitations 11/29/2011  . Abnormal mammogram of right breast 08/30/2011  . Arthritis of left knee 05/24/2011  . Coronary artery disease involving native coronary artery of native heart with angina pectoris ([place]) 05/24/2011  . History of acute myocardial infarction of inferior wall 05/24/2011   Kerin Perna, PTA 10/21/17 10:33 AM  [place], [place], [address] Phone: [phone]   Fax:  [phone]  Name: Sara Gardner MRN: [id number] Date  of Birth: 01-05-1951

## 2017-10-21 NOTE — Assessment & Plan Note (Signed)
Symptoms consistent with lumbar spinal stenosis. Somewhat better with therapy but would like to continue physical therapy for now which I agree with. MRI did show central stenosis, facet arthritis. She also had transitional vertebrae with an L6 vertebrae as well. Continue PT for now, we will discuss epidurals at a future visit if needed. Return to see me in 6 weeks.

## 2017-10-25 ENCOUNTER — Ambulatory Visit (INDEPENDENT_AMBULATORY_CARE_PROVIDER_SITE_OTHER): Payer: Medicare Other | Admitting: Physical Therapy

## 2017-10-25 DIAGNOSIS — M5441 Lumbago with sciatica, right side: Secondary | ICD-10-CM | POA: Diagnosis not present

## 2017-10-25 DIAGNOSIS — M6281 Muscle weakness (generalized): Secondary | ICD-10-CM | POA: Diagnosis not present

## 2017-10-25 DIAGNOSIS — R29898 Other symptoms and signs involving the musculoskeletal system: Secondary | ICD-10-CM | POA: Diagnosis not present

## 2017-10-25 NOTE — Therapy (Signed)
[place], [place], [address] Phone: [phone]   Fax:  [phone]  Physical Therapy Treatment  Patient Details  Name: Sara Gardner MRN: [id number] Date of Birth: 25-Jun-1951 Referring Provider: Dr. Dianah Field   Encounter Date: 10/25/2017  PT End of Session - 10/25/17 0855    Visit Number  10    Number of Visits  12    Date for PT Re-Evaluation  10/28/17    PT Start Time  0847    PT Stop Time  0943    PT Time Calculation (min)  56 min    Activity Tolerance  Patient tolerated treatment well    Behavior During Therapy  [facility] for tasks assessed/performed       Past Medical History:  Diagnosis Date  . Aortic atherosclerosis ([place]) 06/10/2017  . CAD (coronary artery disease)   . Cancer ([place])   . Cardiomegaly 06/10/2017  . 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 Dermatochalasis of both eyelids 04/07/2017  . Diabetes mellitus without complication ([place])   . Diastolic heart failure, NYHA class 1 ([place])   . GERD (gastroesophageal reflux disease)   . Hypertension   . Lumbar degenerative disc disease 03/09/2017  . Myocardial infarction ([place]) 2005  . Papillary thyroid carcinoma ([place])   . Posterior vitreous detachment, right eye 04/07/2017  . Thyroid disease     No past surgical history on file.  There were no vitals filed for this visit.  Subjective Assessment - 10/25/17 0856    Subjective  Pt hasn't had pain in 4 day since last visit.  Her knees are more painful though.  She is driving up to [place] on Thursday and won't return until following week. She is interested in continuation of therapy to help her back.     Patient Stated Goals  get rid of the LBP and keep it from coming back     Currently in Pain?  Yes    Pain Score  7     Pain Location  Knee    Pain Orientation  Left;Right    Pain Descriptors / Indicators  Sore    Aggravating Factors   ?    Pain Relieving  Factors  rest, heating pad.          [facility] PT Assessment - 10/25/17 0001      Assessment   Medical Diagnosis  DDD lumbar spine     Referring Provider  Dr. Dianah Field    Onset Date/Surgical Date  08/26/17    Hand Dominance  Right      Flexibility   Quadriceps  Lt knee 110 deg; Rt knee 104 deg.         [facility] Adult PT Treatment/Exercise - 10/25/17 0001      Lumbar Exercises: Stretches   Passive Hamstring Stretch  Right;Left;2 reps;30 seconds    Lower Trunk Rotation  -- gentle rocking back and forth.     Quad Stretch  Right;Left;3 reps;30 seconds prone with strap    Piriformis Stretch  Right;Left;2 reps;30 seconds travell      Lumbar Exercises: Aerobic   Tread Mill  5 min @ 1.6 mph      Lumbar Exercises: Supine   Clam  5 reps 2 sets, red band around thighs    Bent Knee Raise  10 reps;5 seconds;Limitations with ab set    Bridge  5 reps 2 sets       Lumbar Exercises: Prone  Opposite Arm/Leg Raise  Right arm/Left leg;Left arm/Right leg;5 reps VC for core engagement.       Moist Heat Therapy   Number Minutes Moist Heat  20 Minutes    Moist Heat Location  Lumbar Spine;Hip;Knee Rt      Electrical Stimulation   Electrical Stimulation Location  Rt lumbar/QL/piriformis/glut med     Electrical Stimulation Action  IFC                  PT Long Term Goals - 10/25/17 0859      PT LONG TERM GOAL #1   Title  Improve core strength and stability allowing patient to perform lumbar stabilization exercises thus improving functional abilities 10/28/17    Time  6    Period  Weeks    Status  On-going      PT LONG TERM GOAL #2   Title  Increase LE strength to 5-/5 to 5/5 10/28/17    Time  6    Period  Weeks    Status  Achieved      PT LONG TERM GOAL #3   Title  Decrease pain and episodes of flare up of LBP with patient reporting no more than 2-4/10 pain level with episodes of pain no more frequent than every 2-4 weeks 10/28/17    Time  6    Period  Weeks    Status   On-going 4 days painfree since 10/21/17      PT LONG TERM GOAL #4   Title  Independent in HEP 10/28/17    Time  6    Period  Weeks    Status  On-going      PT LONG TERM GOAL #5   Title  Improve FOTO to </= 35% limitation 10/28/17    Time  6    Period  Weeks    Status  Achieved            Plan - 10/25/17 0916    Clinical Impression Statement  Pt has reported reduction of pain in low back since last visit 4 days ago.  Quad flexibility improved. She tolerated all exercises well, without increase in symptoms. Reps of exercises kept at minimum to avoid re-flare of symptoms.  Pt will benefit from continued PT intervention to maximize functional mobility.     Rehab Potential  Good    PT Frequency  2x / week    PT Duration  6 weeks    PT Treatment/Interventions  Patient/family education;ADLs/Self Care Home Management;Electrical Stimulation;Cryotherapy;Iontophoresis 4mg /ml Dexamethasone;Moist Heat;Ultrasound;Dry needling;Manual techniques;Neuromuscular re-education;Therapeutic activities;Therapeutic exercise    PT Next Visit Plan   continue  gentle spine stabilization exercises.  (No NuStep/Bike as pt had flare up with use)    Consulted and Agree with Plan of Care  Patient       Patient will benefit from skilled therapeutic intervention in order to improve the following deficits and impairments:  Postural dysfunction, Improper body mechanics, Pain, Increased fascial restricitons, Increased muscle spasms, Hypomobility, Decreased mobility, Decreased range of motion, Decreased strength, Decreased activity tolerance  Visit Diagnosis: Acute right-sided low back pain with right-sided sciatica  Muscle weakness (generalized)  Other symptoms and signs involving the musculoskeletal system     Problem List Patient Active Problem List   Diagnosis Date Noted  . Urge incontinence 08/23/2017  . 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/23/2017  . Urinary frequency 08/17/2017  .  Overactive bladder 08/17/2017  . Equinus contracture of ankle 07/27/2017  . Hammer toes of  both feet 07/27/2017  . Dermatophytosis, nail 07/27/2017  . Calcaneal spur of both feet 07/27/2017  . Dysuria 07/07/2017  . Atypical chest pain 06/10/2017  . Aortic atherosclerosis ([place]) 06/10/2017  . Cardiomegaly 06/10/2017  . Vitreous detachment of right eye 05/12/2017  . 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 Dermatochalasis of both eyelids 04/07/2017  . Posterior vitreous detachment, right eye 04/07/2017  . Acute bilateral low back pain without sciatica 03/09/2017  . 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 Lumbar degenerative disc disease 03/09/2017  . Acute right flank pain 03/07/2017  . Primary osteoarthritis of left knee 03/04/2017  . Chronic pain of left knee 03/04/2017  . Bilateral primary osteoarthritis of knee 12/15/2016  . Bilateral lower extremity edema 12/15/2016  . Allergy to honey bee venom 12/15/2016  . Cyst of right kidney 07/09/2016  . Essential hypertension 02/16/2016  . DM type 2 with diabetic dyslipidemia ([place]) 02/10/2016  . Gastroesophageal reflux disease without esophagitis 02/10/2016  . Breast pain, left 12/30/2015  . 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 Diabetic sensorimotor polyneuropathy ([place]) 10/27/2015  . Papillary thyroid carcinoma ([place]) 08/09/2014  . Post-menopausal atrophic vaginitis 06/24/2014  . Left thyroid nodule 01/29/2014  . Nodule of left lung 01/21/2014  . Obesity 06/12/2012  . Palpitations 11/29/2011  . Abnormal mammogram of right breast 08/30/2011  . Arthritis of left knee 05/24/2011  . Coronary artery disease involving native coronary artery of native heart with angina pectoris ([place]) 05/24/2011  . History of acute myocardial infarction of inferior wall 05/24/2011   Kerin Perna, PTA 10/25/17 9:54 AM  [facility] [place], [place], [address] Phone: [phone]   Fax:  [phone]  Name: Sara Gardner MRN: [id number] Date of Birth: 01-25-1951

## 2017-11-02 ENCOUNTER — Ambulatory Visit (INDEPENDENT_AMBULATORY_CARE_PROVIDER_SITE_OTHER): Payer: Medicare Other | Admitting: Physical Therapy

## 2017-11-02 DIAGNOSIS — R29898 Other symptoms and signs involving the musculoskeletal system: Secondary | ICD-10-CM | POA: Diagnosis not present

## 2017-11-02 DIAGNOSIS — M5441 Lumbago with sciatica, right side: Secondary | ICD-10-CM | POA: Diagnosis not present

## 2017-11-02 DIAGNOSIS — M6281 Muscle weakness (generalized): Secondary | ICD-10-CM | POA: Diagnosis not present

## 2017-11-02 NOTE — Therapy (Signed)
[place], [place], [address] Phone: [phone]   Fax:  [phone]  Physical Therapy Treatment  Patient Details  Name: Sara Gardner MRN: [id number] Date of Birth: 04/27/1951 Referring Provider: Dr. Dianah Field   Progress Note  Reporting period from 09/16/17 to 11/02/17  See below for objective finding and assessment of progress.   Encounter Date: 11/02/2017  PT End of Session - 11/02/17 9563    Visit Number  11    Number of Visits  20    Date for PT Re-Evaluation  12/14/17    PT Start Time  0805    PT Stop Time  0900    PT Time Calculation (min)  55 min    Activity Tolerance  Patient tolerated treatment well    Behavior During Therapy  [facility] for tasks assessed/performed       Past Medical History:  Diagnosis Date  . Aortic atherosclerosis ([place]) 06/10/2017  . CAD (coronary artery disease)   . Cancer ([place])   . Cardiomegaly 06/10/2017  . 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 Dermatochalasis of both eyelids 04/07/2017  . Diabetes mellitus without complication ([place])   . Diastolic heart failure, NYHA class 1 ([place])   . GERD (gastroesophageal reflux disease)   . Hypertension   . Lumbar degenerative disc disease 03/09/2017  . Myocardial infarction ([place]) 2005  . Papillary thyroid carcinoma ([place])   . Posterior vitreous detachment, right eye 04/07/2017  . Thyroid disease     No past surgical history on file.  There were no vitals filed for this visit.  Subjective Assessment - 11/02/17 0812    Subjective  She just returned 800 mile drive from [place].  She drove by herself.  She believes the caffeinated tea she drank on Sunday afternoon increased her back pain; has had increased pain since then.     Patient Stated Goals  get rid of the LBP and keep it from coming back     Currently in Pain?  Yes    Pain Score  7     Pain Location  Back    Pain Orientation  Right     Pain Descriptors / Indicators  Sharp    Pain Radiating Towards  down to buttocks.     Aggravating Factors   caffeine    Pain Relieving Factors  heating pad.          [facility] PT Assessment - 11/02/17 0001      Assessment   Medical Diagnosis  DDD lumbar spine     Referring Provider  Dr. Dianah Field    Onset Date/Surgical Date  08/26/17    Hand Dominance  Right       [facility] Adult PT Treatment/Exercise - 11/02/17 0001      Lumbar Exercises: Stretches   Passive Hamstring Stretch  Right;Left;2 reps;60 seconds    Lower Trunk Rotation  -- gentle rocking back and forth.     Quad Stretch  Right;Left;3 reps;30 seconds Seated, foot under chair.     Piriformis Stretch  Right;Left;2 reps;60 seconds travell      Lumbar Exercises: Aerobic   Tread Mill  5.5 min @ 1.1 mph      Lumbar Exercises: Supine   Bridge  10 reps;3 seconds      Lumbar Exercises: Sidelying   Clam  Right;Left;15 reps      Moist Heat Therapy   Number Minutes Moist Heat  20 Minutes  Moist Heat Location  Lumbar Spine;Hip;Knee Rt      Electrical Stimulation   Electrical Stimulation Location  Rt lumbar/QL/piriformis/glut med     Printmaker Action  IFC    Electrical Stimulation Parameters  to tolerance    Electrical Stimulation Goals  Pain      Manual Therapy   Soft tissue mobilization  gentle STM to Rt hip (glutes and deep rotators)         PT Long Term Goals - 11/02/17 1242      PT LONG TERM GOAL #1   Title  Improve core strength and stability allowing patient to perform lumbar stabilization exercises thus improving functional abilities 12/14/17    Time  12    Status  Revised      PT LONG TERM GOAL #3   Title  Decrease pain and episodes of flare up of LBP with patient reporting no more than 2-4/10 pain level with episodes of pain no more frequent than every 2-4 weeks 12/14/17    Time  12    Period  Weeks    Status  Revised      PT LONG TERM GOAL #4   Title  Independent in HEP 12/14/17     Time  12    Period  Weeks    Status  Revised      PT LONG TERM GOAL #5   Title  Improve FOTO to </= 35% limitation 10/28/17            Plan - 11/02/17 0824    Clinical Impression Statement  Pt has made good changes in her body mechanics with work; this has helped her avoid further back irritation.  She has had a recent flare up of symptoms since traveling by car to/from [place]; had limited tolerance for therapeutic ex. She reported significant reduction in pain at end of session.  She will benefit from additional visits to help reduce pain and improve her functional mobility.      Clinical Impairments Affecting Rehab Potential  arthritis Lt > Rt knees; sedentary lifestyle     PT Frequency  2x / week    PT Duration  6 weeks    PT Treatment/Interventions  Patient/family education;ADLs/Self Care Home Management;Electrical Stimulation;Cryotherapy;Iontophoresis 4mg /ml Dexamethasone;Moist Heat;Ultrasound;Dry needling;Manual techniques;Neuromuscular re-education;Therapeutic activities;Therapeutic exercise    PT Next Visit Plan  Spoke to supervising PT regarding pt's progress and desire to continue therapy (end of POC). continue  gentle spine stabilization exercises.  (No NuStep/Bike as pt had flare up with use)    Consulted and Agree with Plan of Care  Patient       Patient will benefit from skilled therapeutic intervention in order to improve the following deficits and impairments:  Postural dysfunction, Improper body mechanics, Pain, Increased fascial restricitons, Increased muscle spasms, Hypomobility, Decreased mobility, Decreased range of motion, Decreased strength, Decreased activity tolerance  Visit Diagnosis: Acute right-sided low back pain with right-sided sciatica  Muscle weakness (generalized)  Other symptoms and signs involving the musculoskeletal system     Problem List Patient Active Problem List   Diagnosis Date Noted  . Urge incontinence 08/23/2017  . Primary  osteoarthritis involving multiple joints 08/23/2017  . Urinary frequency 08/17/2017  . Overactive bladder 08/17/2017  . Equinus contracture of ankle 07/27/2017  . Hammer toes of both feet 07/27/2017  . Dermatophytosis, nail 07/27/2017  . Calcaneal spur of both feet 07/27/2017  . Dysuria 07/07/2017  . Atypical chest pain 06/10/2017  . Aortic atherosclerosis ([place]) 06/10/2017  .  Cardiomegaly 06/10/2017  . Vitreous detachment of right eye 05/12/2017  . 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 Dermatochalasis of both eyelids 04/07/2017  . Posterior vitreous detachment, right eye 04/07/2017  . Acute bilateral low back pain without sciatica 03/09/2017  . 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 Lumbar degenerative disc disease 03/09/2017  . Acute right flank pain 03/07/2017  . Primary osteoarthritis of left knee 03/04/2017  . Chronic pain of left knee 03/04/2017  . Bilateral primary osteoarthritis of knee 12/15/2016  . Bilateral lower extremity edema 12/15/2016  . Allergy to honey bee venom 12/15/2016  . Cyst of right kidney 07/09/2016  . Essential hypertension 02/16/2016  . DM type 2 with diabetic dyslipidemia ([place]) 02/10/2016  . Gastroesophageal reflux disease without esophagitis 02/10/2016  . Breast pain, left 12/30/2015  . 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 Diabetic sensorimotor polyneuropathy ([place]) 10/27/2015  . Papillary thyroid carcinoma ([place]) 08/09/2014  . Post-menopausal atrophic vaginitis 06/24/2014  . Left thyroid nodule 01/29/2014  . Nodule of left lung 01/21/2014  . Obesity 06/12/2012  . Palpitations 11/29/2011  . Abnormal mammogram of right breast 08/30/2011  . Arthritis of left knee 05/24/2011  . Coronary artery disease involving native coronary artery of native heart with angina pectoris ([place]) 05/24/2011  . History of acute myocardial infarction of inferior wall 05/24/2011   Kerin Perna, PTA 11/02/17 12:51 PM    Celyn P. Helene Kelp PT, MPH 11/02/17 12:51 PM '  [facility] [place], [place], [address] Phone: [phone]   Fax:  [phone]  Name: Sara Gardner MRN: [id number] Date of Birth: 09-08-1950

## 2017-11-03 ENCOUNTER — Ambulatory Visit (INDEPENDENT_AMBULATORY_CARE_PROVIDER_SITE_OTHER): Payer: Medicare Other | Admitting: Physical Therapy

## 2017-11-03 DIAGNOSIS — M5441 Lumbago with sciatica, right side: Secondary | ICD-10-CM | POA: Diagnosis not present

## 2017-11-03 DIAGNOSIS — R29898 Other symptoms and signs involving the musculoskeletal system: Secondary | ICD-10-CM | POA: Diagnosis not present

## 2017-11-03 DIAGNOSIS — M6281 Muscle weakness (generalized): Secondary | ICD-10-CM

## 2017-11-03 NOTE — Patient Instructions (Addendum)
  Resistive Band Rowing   Tighten core muscles.  With resistive band anchored in door, grasp both ends. Keeping elbows bent, pull back, squeezing shoulder blades together. Hold _3-5___ seconds. Repeat _10-___ times, 2 sets. Do __1__ sessions per day.   Strengthening: Resisted Extension   Tighten core muscles.  Hold tubing with both hands, arms forward. Pull arms back, elbow straight. Repeat _10___ times per set. Do __2__ sets per session. Do _1___ sessions per day.  Combination (Hook-Lying)    Tighten stomach and slowly raise left leg and lower opposite arm over head. Keep trunk rigid. Repeat __10__ times per set. Do __1__ sets per session. Do __1__ sessions per day.

## 2017-11-03 NOTE — Therapy (Signed)
[place], [place], [address] Phone: [phone]   Fax:  [phone]  Physical Therapy Treatment  Patient Details  Name: Sara Gardner MRN: [id number] Date of Birth: 04-16-1951 Referring Provider: Dr. Dianah Field   Encounter Date: 11/03/2017  PT End of Session - 11/03/17 0811    Visit Number  12    Number of Visits  20    Date for PT Re-Evaluation  12/14/17    PT Start Time  0807 pt arrived late    PT Stop Time  8250    PT Time Calculation (min)  50 min    Activity Tolerance  Patient tolerated treatment well;No increased pain    Behavior During Therapy  W[facility] for tasks assessed/performed       Past Medical History:  Diagnosis Date  . Aortic atherosclerosis ([place]) 06/10/2017  . CAD (coronary artery disease)   . Cancer ([place])   . Cardiomegaly 06/10/2017  . 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 Dermatochalasis of both eyelids 04/07/2017  . Diabetes mellitus without complication ([place])   . Diastolic heart failure, NYHA class 1 ([place])   . GERD (gastroesophageal reflux disease)   . Hypertension   . Lumbar degenerative disc disease 03/09/2017  . Myocardial infarction ([place]) 2005  . Papillary thyroid carcinoma ([place])   . Posterior vitreous detachment, right eye 04/07/2017  . Thyroid disease     No past surgical history on file.  There were no vitals filed for this visit.  Subjective Assessment - 11/03/17 0812    Subjective  Pt reports her pain last night had decreased to 4/10 and this morning her pain has reduced even more.  She reports she is ready to do a little more exercise today.     Patient Stated Goals  get rid of the LBP and keep it from coming back     Currently in Pain?  Yes    Pain Score  2     Pain Location  Back    Pain Orientation  Right    Pain Descriptors / Indicators  Aching    Aggravating Factors   caffeine    Pain Relieving Factors  heating pad.           [facility] PT Assessment - 11/03/17 0001      Assessment   Medical Diagnosis  DDD lumbar spine    Referring Provider  Dr. Dianah Field    Onset Date/Surgical Date  08/26/17    Hand Dominance  Right    Next MD Visit  12/02/17      Flexibility   Hamstrings  bilat ~ 90 deg        [facility] Adult PT Treatment/Exercise - 11/03/17 0001      Lumbar Exercises: Stretches   Passive Hamstring Stretch  Right;Left;3 reps supine with strap    Piriformis Stretch  Right;Left;3 reps 45 sec      Lumbar Exercises: Aerobic   Tread Mill  5.5 min @ 1.6 mph      Lumbar Exercises: Standing   Scapular Retraction  --    Row  Strengthening;Both;10 reps;Theraband    Theraband Level (Row)  Level 2 (Red) core engaged    Shoulder Extension  Strengthening;Both;10 reps;Theraband    Theraband Level (Shoulder Extension)  Level 1 (Yellow) core engaged      Lumbar Exercises: Supine   Dead Bug  10 reps core engaged with opp arm lift.     Bridge  5  reps 2 sets       Moist Heat Therapy   Number Minutes Moist Heat  20 Minutes    Moist Heat Location  Lumbar Spine;Hip;Knee Rt      Electrical Stimulation   Electrical Stimulation Location  Rt lumbar/QL/piriformis/glut med     Electrical Stimulation Action  IFC    Electrical Stimulation Parameters  to tolerance     Electrical Stimulation Goals  Pain             PT Education - 11/03/17 0820    Education provided  Yes    Education Details  HEP    Person(s) Educated  Patient    Methods  Explanation;Handout;Verbal cues;Demonstration    Comprehension  Verbalized understanding;Returned demonstration          PT Long Term Goals - 11/02/17 1242      PT LONG TERM GOAL #1   Title  Improve core strength and stability allowing patient to perform lumbar stabilization exercises thus improving functional abilities 12/14/17    Time  12    Status  Revised      PT LONG TERM GOAL #3   Title  Decrease pain and episodes of flare up of LBP with patient  reporting no more than 2-4/10 pain level with episodes of pain no more frequent than every 2-4 weeks 12/14/17    Time  12    Period  Weeks    Status  Revised      PT LONG TERM GOAL #4   Title  Independent in HEP 12/14/17    Time  12    Period  Weeks    Status  Revised      PT LONG TERM GOAL #5   Title  Improve FOTO to </= 35% limitation 10/28/17            Plan - 11/03/17 0821    Clinical Impression Statement  Pt had positive response to last treatment, reporting less pain.   She tolerated all new exercises without increase in symptoms. Will assess response to new exercises next visit.    Rehab Potential  Good    Clinical Impairments Affecting Rehab Potential  arthritis Lt > Rt knees; sedentary lifestyle     PT Frequency  2x / week    PT Duration  6 weeks    PT Treatment/Interventions  Patient/family education;ADLs/Self Care Home Management;Electrical Stimulation;Cryotherapy;Iontophoresis 4mg /ml Dexamethasone;Moist Heat;Ultrasound;Dry needling;Manual techniques;Neuromuscular re-education;Therapeutic activities;Therapeutic exercise       Patient will benefit from skilled therapeutic intervention in order to improve the following deficits and impairments:  Postural dysfunction, Improper body mechanics, Pain, Increased fascial restricitons, Increased muscle spasms, Hypomobility, Decreased mobility, Decreased range of motion, Decreased strength, Decreased activity tolerance  Visit Diagnosis: Acute right-sided low back pain with right-sided sciatica  Muscle weakness (generalized)  Other symptoms and signs involving the musculoskeletal system     Problem List Patient Active Problem List   Diagnosis Date Noted  . Urge incontinence 08/23/2017  . 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/23/2017  . Urinary frequency 08/17/2017  . Overactive bladder 08/17/2017  . Equinus contracture of ankle 07/27/2017  . Hammer toes of both feet 07/27/2017  . Dermatophytosis, nail  07/27/2017  . Calcaneal spur of both feet 07/27/2017  . Dysuria 07/07/2017  . Atypical chest pain 06/10/2017  . Aortic atherosclerosis ([place]) 06/10/2017  . Cardiomegaly 06/10/2017  . Vitreous detachment of right eye 05/12/2017  . 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 Dermatochalasis of both eyelids  04/07/2017  . Posterior vitreous detachment, right eye 04/07/2017  . Acute bilateral low back pain without sciatica 03/09/2017  . 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 Lumbar degenerative disc disease 03/09/2017  . Acute right flank pain 03/07/2017  . Primary osteoarthritis of left knee 03/04/2017  . Chronic pain of left knee 03/04/2017  . Bilateral primary osteoarthritis of knee 12/15/2016  . Bilateral lower extremity edema 12/15/2016  . Allergy to honey bee venom 12/15/2016  . Cyst of right kidney 07/09/2016  . Essential hypertension 02/16/2016  . DM type 2 with diabetic dyslipidemia ([place]) 02/10/2016  . Gastroesophageal reflux disease without esophagitis 02/10/2016  . Breast pain, left 12/30/2015  . 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 Diabetic sensorimotor polyneuropathy ([place]) 10/27/2015  . Papillary thyroid carcinoma ([place]) 08/09/2014  . Post-menopausal atrophic vaginitis 06/24/2014  . Left thyroid nodule 01/29/2014  . Nodule of left lung 01/21/2014  . Obesity 06/12/2012  . Palpitations 11/29/2011  . Abnormal mammogram of right breast 08/30/2011  . Arthritis of left knee 05/24/2011  . Coronary artery disease involving native coronary artery of native heart with angina pectoris ([place]) 05/24/2011  . History of acute myocardial infarction of inferior wall 05/24/2011   Kerin Perna, PTA 11/03/17 1:09 PM  [facility] [place], [place], [address] Phone: [phone]   Fax:  [phone]  Name: SINCLAIRE ARTIGA MRN: [id number] Date of Birth:  September 09, 1950

## 2017-11-04 ENCOUNTER — Encounter: Payer: Medicare Other | Admitting: Physical Therapy

## 2017-11-10 ENCOUNTER — Ambulatory Visit (INDEPENDENT_AMBULATORY_CARE_PROVIDER_SITE_OTHER): Payer: Medicare Other | Admitting: Physical Therapy

## 2017-11-10 DIAGNOSIS — R29898 Other symptoms and signs involving the musculoskeletal system: Secondary | ICD-10-CM | POA: Diagnosis not present

## 2017-11-10 DIAGNOSIS — M6281 Muscle weakness (generalized): Secondary | ICD-10-CM | POA: Diagnosis not present

## 2017-11-10 DIAGNOSIS — M5441 Lumbago with sciatica, right side: Secondary | ICD-10-CM | POA: Diagnosis not present

## 2017-11-10 NOTE — Therapy (Signed)
[place], [place], [address] Phone: [phone]   Fax:  [phone]  Physical Therapy Treatment  Patient Details  Name: Sara Gardner MRN: [id number] Date of Birth: [birth year] Referring Provider: Dr. Dianah Field   Encounter Date: 11/10/2017  PT End of Session - 11/10/17 0955    Visit Number  13    Number of Visits  20    Date for PT Re-Evaluation  12/14/17    PT Start Time  0933    PT Stop Time  1033    PT Time Calculation (min)  60 min    Activity Tolerance  Patient tolerated treatment well    Behavior During Therapy  [facility] for tasks assessed/performed       Past Medical History:  Diagnosis Date  . Aortic atherosclerosis ([place]) 06/10/2017  . CAD (coronary artery disease)   . Cancer ([place])   . Cardiomegaly 06/10/2017  . 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 Dermatochalasis of both eyelids 04/07/2017  . Diabetes mellitus without complication ([place])   . Diastolic heart failure, NYHA class 1 ([place])   . GERD (gastroesophageal reflux disease)   . Hypertension   . Lumbar degenerative disc disease 03/09/2017  . Myocardial infarction ([place]) 2005  . Papillary thyroid carcinoma ([place])   . Posterior vitreous detachment, right eye 04/07/2017  . Thyroid disease     No past surgical history on file.  There were no vitals filed for this visit.  Subjective Assessment - 11/10/17 0935    Subjective  Pt reports she had a little pain after last session, but it has resolved.  She has been doing stretches and HEP. "Feeling pretty good".  She is going to start going to [facility] soon.    Currently in Pain?  No/denies    Pain Score  0-No pain         [facility] PT Assessment - 11/10/17 0001      Flexibility   Quadriceps  Lt 112 deg; Rt 116 deg         [facility] Adult PT Treatment/Exercise - 11/10/17 0001      Lumbar Exercises: Stretches   Passive Hamstring Stretch  Right;Left;3 reps  supine with strap    Double Knee to Chest Stretch  2 reps;30 seconds    Piriformis Stretch  Right;Left;2 reps 45 sec    Other Lumbar Stretch Exercise  seated forward bend to stretch back extensors x 3 reps      Lumbar Exercises: Aerobic   Tread Mill  5 min @ 1.6 mph      Lumbar Exercises: Standing   Row  Strengthening;15 reps;Theraband    Theraband Level (Row)  Level 2 (Red) core engaged    Shoulder Extension  Strengthening;Both;Theraband;15 reps    Theraband Level (Shoulder Extension)  Level 1 (Yellow) core engaged, cues for scap retraction      Lumbar Exercises: Supine   Dead Bug  20 reps core engaged with opp arm lift.       Lumbar Exercises: Prone   Opposite Arm/Leg Raise  Right arm/Left leg;Left arm/Right leg;5 reps 2 sets, VC for technique and to not raise leg too high      Moist Heat Therapy   Number Minutes Moist Heat  20 Minutes    Moist Heat Location  Lumbar Spine;Hip;Knee Rt      Electrical Stimulation   Electrical Stimulation Location  Rt lumbar/QL/piriformis/glut med     Electrical Stimulation Action  IFC  Electrical Stimulation Parameters  to tolerance    Electrical Stimulation Goals  Pain                  PT Long Term Goals - 11/10/17 1005      PT LONG TERM GOAL #1   Title  Improve core strength and stability allowing patient to perform lumbar stabilization exercises thus improving functional abilities 12/14/17    Time  12    Period  Weeks    Status  On-going      PT LONG TERM GOAL #2   Title  Increase LE strength to 5-/5 to 5/5 10/28/17    Time  6    Period  Weeks    Status  Achieved      PT LONG TERM GOAL #3   Title  Decrease pain and episodes of flare up of LBP with patient reporting no more than 2-4/10 pain level with episodes of pain no more frequent than every 2-4 weeks 12/14/17    Time  12    Period  Weeks    Status  On-going      PT LONG TERM GOAL #4   Title  Independent in HEP 12/14/17    Time  12    Period  Weeks    Status   On-going      PT LONG TERM GOAL #5   Title  Improve FOTO to </= 35% limitation 10/28/17    Time  6    Period  Weeks    Status  Achieved            Plan - 11/10/17 1009    Clinical Impression Statement  Pt's back pain from recent trip to [place] has resolved.  She had mild increase in LBP with second set of prone opp arm/leg.  She required minor cues on correction of form for standing core exercises from HEP.  Pain resolved with use of MHP and estim at end of session.  Pt's flexibility of LE continues to improve. Progressing towards remaining goals.     Rehab Potential  Good    PT Frequency  2x / week    PT Duration  6 weeks    PT Treatment/Interventions  Patient/family education;ADLs/Self Care Home Management;Electrical Stimulation;Cryotherapy;Iontophoresis 4mg /ml Dexamethasone;Moist Heat;Ultrasound;Dry needling;Manual techniques;Neuromuscular re-education;Therapeutic activities;Therapeutic exercise    PT Next Visit Plan   continue  gentle spine stabilization exercises.  (No NuStep/Bike as pt had flare up with use) Assist pt in transition to gym program.    Consulted and Agree with Plan of Care  Patient       Patient will benefit from skilled therapeutic intervention in order to improve the following deficits and impairments:  Postural dysfunction, Improper body mechanics, Pain, Increased fascial restricitons, Increased muscle spasms, Hypomobility, Decreased mobility, Decreased range of motion, Decreased strength, Decreased activity tolerance  Visit Diagnosis: Acute right-sided low back pain with right-sided sciatica  Muscle weakness (generalized)  Other symptoms and signs involving the musculoskeletal system     Problem List Patient Active Problem List   Diagnosis Date Noted  . Urge incontinence 08/23/2017  . 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/23/2017  . Urinary frequency 08/17/2017  . Overactive bladder 08/17/2017  . Equinus contracture of ankle 07/27/2017   . Hammer toes of both feet 07/27/2017  . Dermatophytosis, nail 07/27/2017  . Calcaneal spur of both feet 07/27/2017  . Dysuria 07/07/2017  . Atypical chest pain 06/10/2017  . Aortic atherosclerosis ([place]) 06/10/2017  . Cardiomegaly 06/10/2017  .  Vitreous detachment of right eye 05/12/2017  . 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 Dermatochalasis of both eyelids 04/07/2017  . Posterior vitreous detachment, right eye 04/07/2017  . Acute bilateral low back pain without sciatica 03/09/2017  . 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 Lumbar degenerative disc disease 03/09/2017  . Acute right flank pain 03/07/2017  . Primary osteoarthritis of left knee 03/04/2017  . Chronic pain of left knee 03/04/2017  . Bilateral primary osteoarthritis of knee 12/15/2016  . Bilateral lower extremity edema 12/15/2016  . Allergy to honey bee venom 12/15/2016  . Cyst of right kidney 07/09/2016  . Essential hypertension 02/16/2016  . DM type 2 with diabetic dyslipidemia ([place]) 02/10/2016  . Gastroesophageal reflux disease without esophagitis 02/10/2016  . Breast pain, left 12/30/2015  . 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 Diabetic sensorimotor polyneuropathy ([place]) 10/27/2015  . Papillary thyroid carcinoma ([place]) 08/09/2014  . Post-menopausal atrophic vaginitis 06/24/2014  . Left thyroid nodule 01/29/2014  . Nodule of left lung 01/21/2014  . Obesity 06/12/2012  . Palpitations 11/29/2011  . Abnormal mammogram of right breast 08/30/2011  . Arthritis of left knee 05/24/2011  . Coronary artery disease involving native coronary artery of native heart with angina pectoris ([place]) 05/24/2011  . History of acute myocardial infarction of inferior wall 05/24/2011   Kerin Perna, PTA 11/10/17 10:23 AM  [place], [place], [address] Phone: [phone]   Fax:   [phone]  Name: Sara Gardner MRN: [id number] Date of Birth: 1951-04-01

## 2017-11-15 ENCOUNTER — Encounter: Payer: Self-pay | Admitting: Physical Therapy

## 2017-11-17 ENCOUNTER — Encounter: Payer: Self-pay | Admitting: Physical Therapy

## 2017-11-22 ENCOUNTER — Other Ambulatory Visit: Payer: Self-pay | Admitting: Sports Medicine

## 2017-11-22 DIAGNOSIS — M5136 Other intervertebral disc degeneration, lumbar region: Secondary | ICD-10-CM

## 2017-11-23 ENCOUNTER — Ambulatory Visit (INDEPENDENT_AMBULATORY_CARE_PROVIDER_SITE_OTHER): Payer: Medicare Other | Admitting: Physical Therapy

## 2017-11-23 DIAGNOSIS — M5441 Lumbago with sciatica, right side: Secondary | ICD-10-CM | POA: Diagnosis not present

## 2017-11-23 DIAGNOSIS — R29898 Other symptoms and signs involving the musculoskeletal system: Secondary | ICD-10-CM

## 2017-11-23 DIAGNOSIS — M6281 Muscle weakness (generalized): Secondary | ICD-10-CM | POA: Diagnosis not present

## 2017-11-23 NOTE — Therapy (Signed)
[place], [place], [address] Phone: [phone]   Fax:  [phone]  Physical Therapy Treatment  Patient Details  Name: Sara Gardner MRN: [id number] Date of Birth: 1951-02-02 Referring Provider: Dr. Dianah Field   Encounter Date: 11/23/2017  PT End of Session - 11/23/17 1112    Visit Number  14    Number of Visits  20    Date for PT Re-Evaluation  12/14/17    PT Start Time  1108 pt arrived    PT Stop Time  1201    PT Time Calculation (min)  53 min    Activity Tolerance  Patient tolerated treatment well    Behavior During Therapy  [facility] for tasks assessed/performed       Past Medical History:  Diagnosis Date  . Aortic atherosclerosis ([place]) 06/10/2017  . CAD (coronary artery disease)   . Cancer ([place])   . Cardiomegaly 06/10/2017  . 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 Dermatochalasis of both eyelids 04/07/2017  . Diabetes mellitus without complication ([place])   . Diastolic heart failure, NYHA class 1 ([place])   . GERD (gastroesophageal reflux disease)   . Hypertension   . Lumbar degenerative disc disease 03/09/2017  . Myocardial infarction ([place]) 2005  . Papillary thyroid carcinoma ([place])   . Posterior vitreous detachment, right eye 04/07/2017  . Thyroid disease     No past surgical history on file.  There were no vitals filed for this visit.  Subjective Assessment - 11/23/17 1110    Subjective  "I've been doing pretty good".  She reports she has joined a gym, and intends to go 2x/wk.  Last episode of back/hip pain, up to 7/10,  was 4 days ago (over weekend), when she "did too much".  Resolved with heat and rest.     Patient Stated Goals  get rid of the LBP and keep it from coming back     Currently in Pain?  No/denies    Pain Score  0-No pain         [facility] PT Assessment - 11/23/17 0001      Assessment   Medical Diagnosis  DDD lumbar spine    Referring  Provider  Dr. Dianah Field    Onset Date/Surgical Date  08/26/17    Hand Dominance  Right    Next MD Visit  12/02/17       [facility] Adult PT Treatment/Exercise - 11/23/17 0001      Lumbar Exercises: Stretches   Passive Hamstring Stretch  Right;Left;2 reps supine with strap    Lower Trunk Rotation  -- gentle rocking back and forth, arms in T      Lumbar Exercises: Aerobic   Tread Mill  5 min @ 1.5 mph      Lumbar Exercises: Standing   Row  Strengthening;15 reps;Theraband    Theraband Level (Row)  Level 2 (Red) core engaged    Shoulder Extension  Strengthening;Both;Theraband;15 reps    Theraband Level (Shoulder Extension)  Level 1 (Yellow) core engaged, cues for scap retraction      Lumbar Exercises: Supine   Bridge  5 reps;5 seconds 2 sets     Other Supine Lumbar Exercises  pilates beginner hundred x 20 pulses, 4 reps;  pilates opposite knee tuck x 10 reps; pilates hundred position with lateral trunk flexion pulses x 4 reps (stopped, pt didn't like this ex)      Lumbar Exercises: Prone   Opposite Arm/Leg  Raise  Right arm/Left leg;Left arm/Right leg;5 reps  VC for technique and to not raise leg too high      Moist Heat Therapy   Number Minutes Moist Heat  20 Minutes    Moist Heat Location  Lumbar Spine;Hip;Knee Rt      Electrical Stimulation   Electrical Stimulation Location  Rt lumbar/QL/piriformis/glut med     Electrical Stimulation Action  IFC    Electrical Stimulation Parameters  to tolerance    Electrical Stimulation Goals  Pain             PT Education - 11/23/17 1157    Education provided  Yes    Education Details  HEP    Person(s) Educated  Patient    Methods  Explanation;Handout;Demonstration;Tactile cues;Verbal cues    Comprehension  Returned demonstration;Verbalized understanding          PT Long Term Goals - 11/23/17 1136      PT LONG TERM GOAL #1   Title  Improve core strength and stability allowing patient to perform lumbar stabilization exercises  thus improving functional abilities 12/14/17    Time  12    Period  Weeks    Status  On-going      PT LONG TERM GOAL #2   Title  Increase LE strength to 5-/5 to 5/5 10/28/17    Time  6    Period  Weeks    Status  Achieved      PT LONG TERM GOAL #3   Title  Decrease pain and episodes of flare up of LBP with patient reporting no more than 2-4/10 pain level with episodes of pain no more frequent than every 2-4 weeks 12/14/17    Time  12    Period  Weeks    Status  On-going      PT LONG TERM GOAL #4   Title  Independent in HEP 12/14/17    Time  12    Period  Weeks    Status  On-going      PT LONG TERM GOAL #5   Title  Improve FOTO to </= 35% limitation 10/28/17    Time  6    Period  Weeks    Status  Achieved            Plan - 11/23/17 1151    Clinical Impression Statement  Pt tolerated all exercises well, with min to no increase in pain in Rt hip/low back. Added pilates 100 (modified to only 20 pulses) for additional core support, per pt request.  Her episodes of back pain seem to be less often, although still as intense.  Progressing well towards remaining goals; nearing d/c.        Patient will benefit from skilled therapeutic intervention in order to improve the following deficits and impairments:  Postural dysfunction, Improper body mechanics, Pain, Increased fascial restricitons, Increased muscle spasms, Hypomobility, Decreased mobility, Decreased range of motion, Decreased strength, Decreased activity tolerance  Visit Diagnosis: Acute right-sided low back pain with right-sided sciatica  Muscle weakness (generalized)  Other symptoms and signs involving the musculoskeletal system     Problem List Patient Active Problem List   Diagnosis Date Noted  . Urge incontinence 08/23/2017  . 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/23/2017  . Urinary frequency 08/17/2017  . Overactive bladder 08/17/2017  . Equinus contracture of ankle 07/27/2017  . Hammer toes of  both feet 07/27/2017  . Dermatophytosis, nail 07/27/2017  . Calcaneal spur of both feet 07/27/2017  .  Dysuria 07/07/2017  . Atypical chest pain 06/10/2017  . Aortic atherosclerosis ([place]) 06/10/2017  . Cardiomegaly 06/10/2017  . Vitreous detachment of right eye 05/12/2017  . 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 Dermatochalasis of both eyelids 04/07/2017  . Posterior vitreous detachment, right eye 04/07/2017  . Acute bilateral low back pain without sciatica 03/09/2017  . 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 Lumbar degenerative disc disease 03/09/2017  . Acute right flank pain 03/07/2017  . Primary osteoarthritis of left knee 03/04/2017  . Chronic pain of left knee 03/04/2017  . Bilateral primary osteoarthritis of knee 12/15/2016  . Bilateral lower extremity edema 12/15/2016  . Allergy to honey bee venom 12/15/2016  . Cyst of right kidney 07/09/2016  . Essential hypertension 02/16/2016  . DM type 2 with diabetic dyslipidemia ([place]) 02/10/2016  . Gastroesophageal reflux disease without esophagitis 02/10/2016  . Breast pain, left 12/30/2015  . 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 Diabetic sensorimotor polyneuropathy ([place]) 10/27/2015  . Papillary thyroid carcinoma ([place]) 08/09/2014  . Post-menopausal atrophic vaginitis 06/24/2014  . Left thyroid nodule 01/29/2014  . Nodule of left lung 01/21/2014  . Obesity 06/12/2012  . Palpitations 11/29/2011  . Abnormal mammogram of right breast 08/30/2011  . Arthritis of left knee 05/24/2011  . Coronary artery disease involving native coronary artery of native heart with angina pectoris ([place]) 05/24/2011  . History of acute myocardial infarction of inferior wall 05/24/2011   Kerin Perna, PTA 11/23/17 11:58 AM  [facility] [place], [place], [address] Phone: [phone]   Fax:  [phone]  Name: Sara Gardner MRN: [id number] Date of Birth: Jul 28, 1950

## 2017-11-23 NOTE — Patient Instructions (Signed)
[place]: Beginner    Start with feet flat. Lift head and hold. Pump hands slightly up and down. Breathe in for count of _4__. Breathe out for count of ___. Do _3-4 sets of 20 pulses, as tolerated.    [facility] at [facility] [address], [place] [address]  [phone] (office) [phone] (fax)

## 2017-11-25 ENCOUNTER — Ambulatory Visit (INDEPENDENT_AMBULATORY_CARE_PROVIDER_SITE_OTHER): Payer: Medicare Other | Admitting: Physical Therapy

## 2017-11-25 DIAGNOSIS — M5441 Lumbago with sciatica, right side: Secondary | ICD-10-CM

## 2017-11-25 DIAGNOSIS — M6281 Muscle weakness (generalized): Secondary | ICD-10-CM

## 2017-11-25 DIAGNOSIS — R29898 Other symptoms and signs involving the musculoskeletal system: Secondary | ICD-10-CM | POA: Diagnosis not present

## 2017-11-25 NOTE — Therapy (Addendum)
[facility] [place] [address] [place], [place], [address] Phone: [phone]   Fax:  [phone]  Physical Therapy Treatment  Patient Details  Name: Sara Gardner MRN: [id number] Date of Birth: 1950/09/04 Referring Provider: Dr. Dianah Field   Encounter Date: 11/25/2017  PT End of Session - 11/25/17 1339    Visit Number  15    Number of Visits  20    Date for PT Re-Evaluation  12/14/17    PT Start Time  1105    PT Stop Time  1159    PT Time Calculation (min)  54 min    Activity Tolerance  Patient tolerated treatment well    Behavior During Therapy  [facility] for tasks assessed/performed       Past Medical History:  Diagnosis Date  . Aortic atherosclerosis ([place]) 06/10/2017  . CAD (coronary artery disease)   . Cancer ([place])   . Cardiomegaly 06/10/2017  . 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 Dermatochalasis of both eyelids 04/07/2017  . Diabetes mellitus without complication ([place])   . Diastolic heart failure, NYHA class 1 ([place])   . GERD (gastroesophageal reflux disease)   . Hypertension   . Lumbar degenerative disc disease 03/09/2017  . Myocardial infarction ([place]) 2005  . Papillary thyroid carcinoma ([place])   . Posterior vitreous detachment, right eye 04/07/2017  . Thyroid disease     No past surgical history on file.  There were no vitals filed for this visit.  Subjective Assessment - 11/25/17 1339    Subjective  Pt reports she has been doing well since last visit.  She visited gym yesterday by herself; she's gaining confidence in transitioning over to gym.  Her back is a little sore this morning, but nothing like it use to be.     Currently in Pain?  Yes    Pain Score  2     Pain Location  Back    Pain Orientation  Right    Pain Descriptors / Indicators  Dull    Aggravating Factors   caffeine, lots of bending    Pain Relieving Factors  heat, estim.          [facility] PT Assessment -  11/25/17 0001      Assessment   Medical Diagnosis  DDD lumbar spine    Referring Provider  Dr. Dianah Field    Onset Date/Surgical Date  08/26/17    Hand Dominance  Right    Next MD Visit  12/02/17       [facility] Adult PT Treatment/Exercise - 11/25/17 0001      Lumbar Exercises: Stretches   Passive Hamstring Stretch  Right;Left;2 reps supine with strap    Lower Trunk Rotation  -- gentle rocking back and forth, arms in T    Piriformis Stretch  Right;Left;2 reps 45 sec      Lumbar Exercises: Aerobic   Tread Mill  5 min @ 1.7 mph      Lumbar Exercises: Standing   Row  Both;Strengthening;20 reps    Theraband Level (Row)  Level 3 (Green)    Shoulder Extension  Strengthening;Both;20 reps;Theraband    Theraband Level (Shoulder Extension)  Level 2 (Red)      Lumbar Exercises: Seated   Other Seated Lumbar Exercises  core engaged: marching x 10, marching with opp arm lift x 10 reps; core engaged with retro lean x 10 sec, repeated with alternating arms x 10      Lumbar Exercises: Supine  Bridge  5 reps;5 seconds    Single Leg Bridge  5 reps;Limitations trial, with fig 4 leg    Bridge with Ball Squeeze Limitations  pt reported increased pain by 1 point after completing.       Moist Heat Therapy   Number Minutes Moist Heat  15 Minutes    Moist Heat Location  Lumbar Spine;Hip;Knee Rt      Electrical Stimulation   Electrical Stimulation Location  Rt lumbar/QL/piriformis/glut med     Electrical Stimulation Action  IFC    Electrical Stimulation Parameters  to tolerance    Electrical Stimulation Goals  Pain        PT Long Term Goals - 11/25/17 1301      PT LONG TERM GOAL #1   Title  Improve core strength and stability allowing patient to perform lumbar stabilization exercises thus improving functional abilities 12/14/17    Time  12    Period  Weeks    Status  Achieved      PT LONG TERM GOAL #2   Title  Increase LE strength to 5-/5 to 5/5 10/28/17    Time  6    Period  Weeks     Status  Achieved      PT LONG TERM GOAL #3   Title  Decrease pain and episodes of flare up of LBP with patient reporting no more than 2-4/10 pain level with episodes of pain no more frequent than every 2-4 weeks 12/14/17    Time  12    Period  Weeks    Status  On-going      PT LONG TERM GOAL #4   Title  Independent in HEP 12/14/17    Time  12    Period  Weeks    Status  On-going      PT LONG TERM GOAL #5   Title  Improve FOTO to </= 35% limitation 10/28/17    Time  6    Period  Weeks    Status  Achieved            Plan - 11/25/17 1140    Clinical Impression Statement  Pt reported slight increase in pain with single leg bridges; advised pt to continue BLE bridges at home instead of progressing to single leg.  She tolerated increased resistance with standing core exercise as well as new seated exercises.  She has partially met her goals and requests to hold therapy while she continues HEP.    Rehab Potential  Good    PT Frequency  2x / week    PT Duration  6 weeks    PT Treatment/Interventions  Patient/family education;ADLs/Self Care Home Management;Electrical Stimulation;Cryotherapy;Iontophoresis 20m/ml Dexamethasone;Moist Heat;Ultrasound;Dry needling;Manual techniques;Neuromuscular re-education;Therapeutic activities;Therapeutic exercise    PT Next Visit Plan  Spoke to supervising PT; will hold therapy for 2 wks per pt request.  If pt doesn't call/schedule, will d/c to HEP.      Consulted and Agree with Plan of Care  Patient       Patient will benefit from skilled therapeutic intervention in order to improve the following deficits and impairments:  Postural dysfunction, Improper body mechanics, Pain, Increased fascial restricitons, Increased muscle spasms, Hypomobility, Decreased mobility, Decreased range of motion, Decreased strength, Decreased activity tolerance  Visit Diagnosis: Acute right-sided low back pain with right-sided sciatica  Muscle weakness (generalized)  Other  symptoms and signs involving the musculoskeletal system     Problem List Patient Active Problem List   Diagnosis Date Noted  .  Urge incontinence 08/23/2017  . 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/23/2017  . Urinary frequency 08/17/2017  . Overactive bladder 08/17/2017  . Equinus contracture of ankle 07/27/2017  . Hammer toes of both feet 07/27/2017  . Dermatophytosis, nail 07/27/2017  . Calcaneal spur of both feet 07/27/2017  . Dysuria 07/07/2017  . Atypical chest pain 06/10/2017  . Aortic atherosclerosis ([place]) 06/10/2017  . Cardiomegaly 06/10/2017  . Vitreous detachment of right eye 05/12/2017  . 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 Dermatochalasis of both eyelids 04/07/2017  . Posterior vitreous detachment, right eye 04/07/2017  . Acute bilateral low back pain without sciatica 03/09/2017  . 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 Lumbar degenerative disc disease 03/09/2017  . Acute right flank pain 03/07/2017  . Primary osteoarthritis of left knee 03/04/2017  . Chronic pain of left knee 03/04/2017  . Bilateral primary osteoarthritis of knee 12/15/2016  . Bilateral lower extremity edema 12/15/2016  . Allergy to honey bee venom 12/15/2016  . Cyst of right kidney 07/09/2016  . Essential hypertension 02/16/2016  . DM type 2 with diabetic dyslipidemia ([place]) 02/10/2016  . Gastroesophageal reflux disease without esophagitis 02/10/2016  . Breast pain, left 12/30/2015  . 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 Diabetic sensorimotor polyneuropathy ([place]) 10/27/2015  . Papillary thyroid carcinoma ([place]) 08/09/2014  . Post-menopausal atrophic vaginitis 06/24/2014  . Left thyroid nodule 01/29/2014  . Nodule of left lung 01/21/2014  . Obesity 06/12/2012  . Palpitations 11/29/2011  . Abnormal mammogram of right breast 08/30/2011  . Arthritis of left knee 05/24/2011  . Coronary artery disease involving native coronary  artery of native heart with angina pectoris ([place]) 05/24/2011  . History of acute myocardial infarction of inferior wall 05/24/2011   Kerin Perna, PTA 11/25/17 1:45 PM  [facility] [place] [address] [place], [place], [address] Phone: [phone]   Fax:  [phone]  Name: ZARRIAH STARKEL MRN: [id number] Date of Birth: [birth year]  PHYSICAL THERAPY DISCHARGE SUMMARY  Visits from Start of Care: 15   Current functional level related to goals / functional outcomes: See last progress note for discharge status    Remaining deficits: Unknown    Education / Equipment: HEP Plan: Patient agrees to discharge.  Patient goals were met. Patient is being discharged due to meeting the stated rehab goals.  ?????    Celyn P. Helene Kelp PT, MPH 01/02/18 2:22 PM

## 2017-11-29 ENCOUNTER — Other Ambulatory Visit: Payer: Self-pay | Admitting: Sports Medicine

## 2017-11-29 ENCOUNTER — Other Ambulatory Visit: Payer: Self-pay | Admitting: Physician Assistant

## 2017-11-29 DIAGNOSIS — I10 Essential (primary) hypertension: Secondary | ICD-10-CM

## 2017-12-02 ENCOUNTER — Ambulatory Visit (INDEPENDENT_AMBULATORY_CARE_PROVIDER_SITE_OTHER): Payer: Medicare Other | Admitting: Sports Medicine

## 2017-12-02 ENCOUNTER — Encounter: Payer: Self-pay | Admitting: Sports Medicine

## 2017-12-02 DIAGNOSIS — E6609 Other obesity due to excess calories: Secondary | ICD-10-CM | POA: Diagnosis not present

## 2017-12-02 DIAGNOSIS — Z6830 Body mass index (BMI) 30.0-30.9, adult: Secondary | ICD-10-CM | POA: Diagnosis not present

## 2017-12-02 DIAGNOSIS — M51369 Other intervertebral disc degeneration, lumbar region without mention of lumbar back pain or lower extremity pain: Secondary | ICD-10-CM

## 2017-12-02 DIAGNOSIS — M5136 Other intervertebral disc degeneration, lumbar region: Secondary | ICD-10-CM | POA: Diagnosis not present

## 2017-12-02 NOTE — Assessment & Plan Note (Signed)
Patient will make an appointment with PCP to discuss weight loss

## 2017-12-02 NOTE — Assessment & Plan Note (Signed)
Near complete resolution of pain with formal physical therapy. Noticing some swelling, stop NSAIDs. She will discuss this further with her PCP. 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 Return as needed. She was graduated to home exercise regimen, continue this indefinitely.

## 2017-12-02 NOTE — Progress Notes (Signed)
Subjective:    CC: Follow-up  HPI: Lumbar degenerative disc disease: Pain now improved after physical therapy  I reviewed the past medical history, family history, social history, surgical history, and allergies today and no changes were needed.  Please see the problem list section below in epic for further details.  Past Medical History: Past Medical History:  Diagnosis Date  . Aortic atherosclerosis ([place]) 06/10/2017  . CAD (coronary artery disease)   . Cancer ([place])   . Cardiomegaly 06/10/2017  . 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 Dermatochalasis of both eyelids 04/07/2017  . Diabetes mellitus without complication ([place])   . Diastolic heart failure, NYHA class 1 ([place])   . GERD (gastroesophageal reflux disease)   . Hypertension   . Lumbar degenerative disc disease 03/09/2017  . Myocardial infarction ([place]) 2005  . Papillary thyroid carcinoma ([place])   . Posterior vitreous detachment, right eye 04/07/2017  . Thyroid disease    Past Surgical History: No past surgical history on file. Social History: Social History   Socioeconomic History  . Marital status: Unknown    Spouse name: Not on file  . Number of children: Not on file  . Years of education: Not on file  . Highest education level: Not on file  Occupational History  . Not on file  Social Needs  . Financial resource strain: Not on file  . Food insecurity:    Worry: Not on file    Inability: Not on file  . Transportation needs:    Medical: Not on file    Non-medical: Not on file  Tobacco Use  . Smoking status: Never Smoker  . Smokeless tobacco: Never Used  Substance and Sexual Activity  . Alcohol use: No  . Drug use: No  . Sexual activity: Not Currently  Lifestyle  . Physical activity:    Days per week: Not on file    Minutes per session: Not on file  . Stress: Not on file  Relationships  . Social connections:    Talks on phone: Not on file    Gets together: Not on file   Attends religious service: Not on file    Active member of club or organization: Not on file    Attends meetings of clubs or organizations: Not on file    Relationship status: Not on file  Other Topics Concern  . Not on file  Social History Narrative  . Not on file   Family History: No family history on file. Allergies: Allergies  Allergen Reactions  . Bee Venom Swelling  . Tuberculin Tests Other (See Comments)  . Tape Rash   Medications: See med rec.  Review of Systems: No fevers, chills, night sweats, weight loss, chest pain, or shortness of breath.   Objective:    General: Well Developed, well nourished, and in no acute distress.  Neuro: Alert and oriented x3, extra-ocular muscles intact, sensation grossly intact.  HEENT: Normocephalic, atraumatic, pupils equal round reactive to light, neck supple, no masses, no lymphadenopathy, thyroid nonpalpable.  Skin: Warm and dry, no rashes. Cardiac: Regular rate and rhythm, no murmurs rubs or gallops, no lower extremity edema.  Respiratory: Clear to auscultation bilaterally. Not using accessory muscles, speaking in full sentences.  Impression and Recommendations:    Lumbar degenerative disc disease Near complete resolution of pain with formal physical therapy. Noticing some swelling, stop NSAIDs. She will discuss this further with her PCP. 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 Return as  needed. She was graduated to home exercise regimen, continue this indefinitely.  Obesity Patient will make an appointment with PCP to discuss weight loss  ___________________________________________ Gwen Her. Dianah Field, M.D., ABFM., CAQSM. Primary Care and [place] Instructor of [place] [employer] [facility] [employer]

## 2017-12-07 ENCOUNTER — Encounter: Payer: Self-pay | Admitting: Physician Assistant

## 2017-12-07 ENCOUNTER — Other Ambulatory Visit: Payer: Self-pay | Admitting: Physician Assistant

## 2017-12-07 DIAGNOSIS — K219 Gastro-esophageal reflux disease without esophagitis: Secondary | ICD-10-CM

## 2017-12-08 ENCOUNTER — Encounter: Payer: Self-pay | Admitting: Physician Assistant

## 2017-12-08 ENCOUNTER — Ambulatory Visit (INDEPENDENT_AMBULATORY_CARE_PROVIDER_SITE_OTHER): Payer: Medicare Other | Admitting: Physician Assistant

## 2017-12-08 VITALS — BP 135/84 | HR 74 | Ht 63.0 in | Wt 177.0 lb

## 2017-12-08 DIAGNOSIS — Z713 Dietary counseling and surveillance: Secondary | ICD-10-CM | POA: Diagnosis not present

## 2017-12-08 DIAGNOSIS — Z719 Counseling, unspecified: Secondary | ICD-10-CM | POA: Diagnosis not present

## 2017-12-08 DIAGNOSIS — E1165 Type 2 diabetes mellitus with hyperglycemia: Secondary | ICD-10-CM

## 2017-12-08 DIAGNOSIS — E6609 Other obesity due to excess calories: Secondary | ICD-10-CM | POA: Diagnosis not present

## 2017-12-08 DIAGNOSIS — Z6831 Body mass index (BMI) 31.0-31.9, adult: Secondary | ICD-10-CM

## 2017-12-08 LAB — HEMOGLOBIN A1C: A1c: 8

## 2017-12-08 MED ORDER — INSULIN PEN NEEDLE 32G X 6 MM MISC: 1 refills | Status: DC

## 2017-12-08 MED ORDER — SEMAGLUTIDE(0.25 OR 0.5MG/DOS) 2 MG/1.5ML ~~LOC~~ SOPN: 0.2500 mg | PEN_INJECTOR | SUBCUTANEOUS | 0 refills | Status: AC

## 2017-12-08 NOTE — Progress Notes (Signed)
HPI:                                                                Sara Gardner is a 67 y.o. female who presents to [facility] today for multiple concerns  Patient reports she was recently visited by a nurse from her insurance company who performed labs and a med rec. HgbA1c 8.0. She states she was told that she has chronic kidney disease; this was not based on any lab work, but rather something that is listed in her chart. She is very concerned about this and would like to discontinue her Metformin as a result.  She is also interested in medical weight management Current weight: 177 Lb Time at current weight: 175-189 x 5 years Weight history 08/2012 189 lb 11/2013 183 lb 09/2014 181 lb 10/2015 177 Lb 01/2017 176 Lb Prior weight loss attempts: traditional fad diets and exercise over the years ([employer] exercise program in 2014) Current exercise habits: no formal exercise, does household chores Current sleep habits: good Current eating habits: "I need to get off the [employer]"  Depression screen [facility] 2/9 05/12/2017  Decreased Interest 0  Down, Depressed, Hopeless 0  PHQ - 2 Score 0    No flowsheet data found.    Past Medical History:  Diagnosis Date  . Aortic atherosclerosis ([place]) 06/10/2017  . CAD (coronary artery disease)   . Cancer ([place])   . Cardiomegaly 06/10/2017  . 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 Dermatochalasis of both eyelids 04/07/2017  . Diabetes mellitus without complication ([place])   . Diastolic heart failure, NYHA class 1 ([place])   . GERD (gastroesophageal reflux disease)   . Hypertension   . Lumbar degenerative disc disease 03/09/2017  . Myocardial infarction ([place]) 2005  . Papillary thyroid carcinoma ([place])   . Posterior vitreous detachment, right eye 04/07/2017  . Thyroid disease    Past Surgical History:  Procedure Laterality Date  . TOTAL THYROIDECTOMY  2016   Social History    Tobacco Use  . Smoking status: Never Smoker  . Smokeless tobacco: Never Used  Substance Use Topics  . Alcohol use: No   family history is not on file.    ROS: negative except as noted in the HPI  Medications: Current Outpatient Medications  Medication Sig Dispense Refill  . acetaminophen (TYLENOL) 650 MG CR tablet Take 2 tablets (1,300 mg total) by mouth every 8 (eight) hours as needed for pain. 90 tablet 3  . AMBULATORY NON FORMULARY MEDICATION Knee-high, medium compression, graduated compression stockings. Apply to lower extremities. [email or web address], Zippered Compression Stockings, medium circ, long length 1 each 0  . ammonium lactate (LAC-HYDRIN) 12 % lotion APP TOPICALLY PRF DRY SKIN  0  . aspirin 81 MG chewable tablet Chew by mouth.    . Calcium Carbonate-Vitamin D 600-400 MG-UNIT tablet Take 1 tablet by mouth 2 (two) times daily. 60 tablet 11  . conjugated estrogens (PREMARIN) vaginal cream Insert 0.5 g intravaginally daily for 1 week, then twice weekly 30 g 5  . diclofenac sodium (VOLTAREN) 1 % GEL Apply 4 g topically 4 (four) times daily. To affected joint. 100 g 1  . EPINEPHrine 0.3 mg/0.3 mL IJ SOAJ injection Inject 1  each as directed as needed. 1 Device 1  . esomeprazole (NEXIUM) 20 MG capsule TAKE 1 CAPSULE(20 MG) BY MOUTH DAILY 90 capsule 0  . Insulin Pen Needle (NOVOFINE) 32G X 6 MM MISC Use subcutaneously daily with Ozempic 90 each 1  . Lancets (ONETOUCH ULTRASOFT) lancets Check fasting blood sugar daily and 2 hours after largest meal of the day. Dx DM. 100 each prn  . levothyroxine (SYNTHROID, LEVOTHROID) 100 MCG tablet Take 100 mcg by mouth daily.  1  . lisinopril-hydrochlorothiazide (PRINZIDE,ZESTORETIC) 20-12.5 MG tablet Take 1 tablet by mouth daily. Need follow up with PCP 90 tablet 0  . metFORMIN (GLUCOPHAGE) 1000 MG tablet TAKE 1 TABLET BY MOUTH EVERY MORNING 90 tablet 2  . metoprolol succinate (TOPROL-XL) 50 MG 24 hr tablet Take 1 tablet (50 mg total) by  mouth daily. 90 tablet 1  . nitroGLYCERIN (NITROSTAT) 0.4 MG SL tablet Place 1 tablet (0.4 mg total) every 5 (five) minutes as needed under the tongue for chest pain. Max dose 3 tablets in 15 minutes 20 tablet 1  . ONE TOUCH ULTRA TEST test strip CHECK FASTING BLOOD SUGAR DAILY AND 2 HOURS AFTER LARGEST MEAL OF THE DAY 100 each 1  . ONETOUCH DELICA LANCETS FINE MISC USE TO CHECK FASTING BLOOD SUGAR DAILY AND 2 HOURS AFTER LARGEST MEAL OF THE DAY. Schedule follow up with PCP 100 each 0  . rosuvastatin (CRESTOR) 20 MG tablet Take 1 tablet (20 mg total) by mouth daily. 90 tablet 3  . Semaglutide (OZEMPIC) 0.25 or 0.5 MG/DOSE SOPN Inject 0.25 mg into the skin once a week for 4 days. 4 pen 0   No current facility-administered medications for this visit.    Allergies  Allergen Reactions  . Bee Venom Swelling  . Tuberculin Tests Other (See Comments)  . Tape Rash       Objective:  BP 135/84   Pulse 74   Ht 5\' 3"  (1.6 m)   Wt 177 lb (80.3 kg)   BMI 31.35 kg/m  Gen:  alert, not ill-appearing, no distress, appropriate for age, obese female HEENT: head normocephalic without obvious abnormality, conjunctiva and cornea clear, wearing glasses, trachea midline Pulm: Normal work of breathing, normal phonation Neuro: alert and oriented x 3, no tremor MSK: extremities atraumatic, normal gait and station Skin: intact, no rashes on exposed skin, no jaundice, no cyanosis Psych: well-groomed, cooperative, good eye contact, euthymic mood, affect mood-congruent, speech is articulate, and thought processes clear and goal-directed    Results for orders placed or performed in visit on 12/07/17 (from the past 72 hour(s))  Hemoglobin A1c     Status: None   Collection Time: 12/07/17 12:00 AM  Result Value Ref Range   A1c 8.0    No results found.    Assessment and Plan: 67 y.o. female with   Health education/counseling  Encounter for weight loss counseling  Class 1 obesity due to excess calories  with serious comorbidity and body mass index (BMI) of 31.0 to 31.9 in adult - Plan: Ambulatory referral to diabetic education  Uncontrolled type 2 diabetes mellitus with hyperglycemia (HCC) - Plan: Semaglutide (OZEMPIC) 0.25 or 0.5 MG/DOSE SOPN, Ambulatory referral to diabetic education, Insulin Pen Needle (NOVOFINE) 32G X 6 MM MISC  - long discussion with patient about pathophysiology of diabetes mellitus and long-term complications. Reviewed laboratory studies with her and explained she does not meet criteria for CKD, baseline Scr 0.88. No proteinuria on recent UA (08/25/17). She is on an ACE for renal  protection.  I personally reviewed Care Everywhere and cannot find any evidence of increased serum creatinine or decreased GFR. All GFR>60. - we discussed the most importance preventive measure for her is glycemic control and A1c reduction - we will continue Metformin - Adding Ozempic to assist with weight loss. She has a history of papillary thyroid carcinoma and is s/p total thyroidectomy, so this is not a contraindication to GLP-1. Reviewed live demo on using the pen. I am also referring her to the diabetic educator for nutrition and exercise counseling - she was instructed to bring her outside labs/results to follow-up appointment  Patient education and anticipatory guidance given Patient agrees with treatment plan Follow-up in 1 month for weight management or sooner as needed if symptoms worsen or fail to improve  Darlyne Russian PA-C

## 2017-12-08 NOTE — Patient Instructions (Signed)
Semaglutide injection solution What is this medicine? SEMAGLUTIDE (Sem a GLOO tide) is used to improve blood sugar control in adults with type 2 diabetes. This medicine may be used with other diabetes medicines. This medicine may be used for other purposes; ask your health care provider or pharmacist if you have questions. COMMON BRAND NAME(S): OZEMPIC What should I tell my health care provider before I take this medicine? They need to know if you have any of these conditions: -endocrine tumors (MEN 2) or if someone in your family had these tumors -eye disease, vision problems -history of pancreatitis -kidney disease -stomach problems -thyroid cancer or if someone in your family had thyroid cancer -an unusual or allergic reaction to semaglutide, other medicines, foods, dyes, or preservatives -pregnant or trying to get pregnant -breast-feeding How should I use this medicine? This medicine is for injection under the skin of your upper leg (thigh), stomach area, or upper arm. It is given once every week (every 7 days). You will be taught how to prepare and give this medicine. Use exactly as directed. Take your medicine at regular intervals. Do not take it more often than directed. If you use this medicine with insulin, you should inject this medicine and the insulin separately. Do not mix them together. Do not give the injections right next to each other. Change (rotate) injection sites with each injection. It is important that you put your used needles and syringes in a special sharps container. Do not put them in a trash can. If you do not have a sharps container, call your pharmacist or healthcare provider to get one. A special MedGuide will be given to you by the pharmacist with each prescription and refill. Be sure to read this information carefully each time. Talk to your pediatrician regarding the use of this medicine in children. Special care may be needed. Overdosage: If you think you  have taken too much of this medicine contact a poison control center or emergency room at once. NOTE: This medicine is only for you. Do not share this medicine with others. What if I miss a dose? If you miss a dose, take it as soon as you can within 5 days after the missed dose. Then take your next dose at your regular weekly time. If it has been longer than 5 days after the missed dose, do not take the missed dose. Take the next dose at your regular time. Do not take double or extra doses. If you have questions about a missed dose, contact your health care provider for advice. What may interact with this medicine? -other medicines for diabetes Many medications may cause changes in blood sugar, these include: -alcohol containing beverages -antiviral medicines for HIV or AIDS -aspirin and aspirin-like drugs -certain medicines for blood pressure, heart disease, irregular heart beat -chromium -diuretics -female hormones, such as estrogens or progestins, birth control pills -fenofibrate -gemfibrozil -isoniazid -lanreotide -female hormones or anabolic steroids -MAOIs like Carbex, Eldepryl, Marplan, Nardil, and Parnate -medicines for weight loss -medicines for allergies, asthma, cold, or cough -medicines for depression, anxiety, or psychotic disturbances -niacin -nicotine -NSAIDs, medicines for pain and inflammation, like ibuprofen or naproxen -octreotide -pasireotide -pentamidine -phenytoin -probenecid -quinolone antibiotics such as ciprofloxacin, levofloxacin, ofloxacin -some herbal dietary supplements -steroid medicines such as prednisone or cortisone -sulfamethoxazole; trimethoprim -thyroid hormones Some medications can hide the warning symptoms of low blood sugar (hypoglycemia). You may need to monitor your blood sugar more closely if you are taking one of these medications. These include: -  beta-blockers, often used for high blood pressure or heart problems (examples include  atenolol, metoprolol, propranolol) -clonidine -guanethidine -reserpine This list may not describe all possible interactions. Give your health care provider a list of all the medicines, herbs, non-prescription drugs, or dietary supplements you use. Also tell them if you smoke, drink alcohol, or use illegal drugs. Some items may interact with your medicine. What should I watch for while using this medicine? Visit your doctor or health care professional for regular checks on your progress. Drink plenty of fluids while taking this medicine. Check with your doctor or health care professional if you get an attack of severe diarrhea, nausea, and vomiting. The loss of too much body fluid can make it dangerous for you to take this medicine. A test called the HbA1C (A1C) will be monitored. This is a simple blood test. It measures your blood sugar control over the last 2 to 3 months. You will receive this test every 3 to 6 months. Learn how to check your blood sugar. Learn the symptoms of low and high blood sugar and how to manage them. Always carry a quick-source of sugar with you in case you have symptoms of low blood sugar. Examples include hard sugar candy or glucose tablets. Make sure others know that you can choke if you eat or drink when you develop serious symptoms of low blood sugar, such as seizures or unconsciousness. They must get medical help at once. Tell your doctor or health care professional if you have high blood sugar. You might need to change the dose of your medicine. If you are sick or exercising more than usual, you might need to change the dose of your medicine. Do not skip meals. Ask your doctor or health care professional if you should avoid alcohol. Many nonprescription cough and cold products contain sugar or alcohol. These can affect blood sugar. Pens should never be shared. Even if the needle is changed, sharing may result in passing of viruses like hepatitis or HIV. Wear a medical  ID bracelet or chain, and carry a card that describes your disease and details of your medicine and dosage times. What side effects may I notice from receiving this medicine? Side effects that you should report to your doctor or health care professional as soon as possible: -allergic reactions like skin rash, itching or hives, swelling of the face, lips, or tongue -breathing problems -changes in vision -diarrhea that continues or is severe -lump or swelling on the neck -severe nausea -signs and symptoms of infection like fever or chills; cough; sore throat; pain or trouble passing urine -signs and symptoms of low blood sugar such as feeling anxious, confusion, dizziness, increased hunger, unusually weak or tired, sweating, shakiness, cold, irritable, headache, blurred vision, fast heartbeat, loss of consciousness -signs and symptoms of kidney injury like trouble passing urine or change in the amount of urine -trouble swallowing -unusual stomach upset or pain -vomiting Side effects that usually do not require medical attention (report to your doctor or health care professional if they continue or are bothersome): -constipation -diarrhea -nausea -pain, redness, or irritation at site where injected -stomach upset This list may not describe all possible side effects. Call your doctor for medical advice about side effects. You may report side effects to FDA at 1-800-FDA-1088. Where should I keep my medicine? Keep out of the reach of children. Store unopened pens in a refrigerator between 2 and 8 degrees C (36 and 46 degrees F). Do not freeze. Protect from  to your doctor or health care professional if they continue or are bothersome):  -constipation  -diarrhea  -nausea  -pain, redness, or irritation at site where injected  -stomach upset  This list may not describe all possible side effects. Call your doctor for medical advice about side effects. You may report side effects to FDA at 1-800-FDA-1088.  Where should I keep my medicine?  Keep out of the reach of children.  Store unopened pens in a refrigerator between 2 and 8 degrees C (36 and 46 degrees F). Do not freeze. Protect from light and heat. After you first use the pen, it can be stored for 56 days at room temperature between 15 and 30 degrees C (59 and 86 degrees F) or in a refrigerator. Throw away  your used pen after 56 days or after the expiration date, whichever comes first.  Do not store your pen with the needle attached. If the needle is left on, medicine may leak from the pen.  NOTE: This sheet is a summary. It may not cover all possible information. If you have questions about this medicine, talk to your doctor, pharmacist, or health care provider.  © 2018 [employer] (2016-07-29 14:43:35)

## 2017-12-08 NOTE — Progress Notes (Signed)
error 

## 2017-12-14 ENCOUNTER — Telehealth: Payer: Self-pay

## 2017-12-14 DIAGNOSIS — E1169 Type 2 diabetes mellitus with other specified complication: Secondary | ICD-10-CM

## 2017-12-14 DIAGNOSIS — E785 Hyperlipidemia, unspecified: Principal | ICD-10-CM

## 2017-12-15 MED ORDER — DULAGLUTIDE 0.75 MG/0.5ML ~~LOC~~ SOAJ: 0.5000 mL | SUBCUTANEOUS | 3 refills | Status: DC

## 2017-12-15 NOTE — Telephone Encounter (Signed)
New Rx sent for Trulicity to replace the Ozempic Inject 0.5 ml into the skin once weekly at bedtime Follow-up 4-6 weeks after starting the medication to assess response

## 2017-12-15 NOTE — Telephone Encounter (Signed)
I researched with the patients insurance and the preferred medications are Victoza or Truliciy. Please advise.

## 2017-12-15 NOTE — Telephone Encounter (Signed)
Tried reaching pt but no response and no Vm. Will retry later. W.Vedanshi Massaro, CCMA

## 2017-12-16 NOTE — Telephone Encounter (Signed)
Have been trying to call pt but her phone is not accepting incoming calls. Sara Gardner, CCMA

## 2017-12-21 ENCOUNTER — Encounter: Payer: Medicare Other | Attending: Physician Assistant | Admitting: *Deleted

## 2017-12-21 ENCOUNTER — Other Ambulatory Visit: Payer: Self-pay | Admitting: Physician Assistant

## 2017-12-21 DIAGNOSIS — E1165 Type 2 diabetes mellitus with hyperglycemia: Secondary | ICD-10-CM | POA: Insufficient documentation

## 2017-12-21 DIAGNOSIS — Z6831 Body mass index (BMI) 31.0-31.9, adult: Secondary | ICD-10-CM | POA: Diagnosis present

## 2017-12-21 DIAGNOSIS — E6609 Other obesity due to excess calories: Secondary | ICD-10-CM | POA: Insufficient documentation

## 2017-12-21 DIAGNOSIS — E1142 Type 2 diabetes mellitus with diabetic polyneuropathy: Secondary | ICD-10-CM

## 2017-12-21 NOTE — Patient Instructions (Signed)
Plan:  Aim for 2-3 Carb Choices per meal (30-45 grams)  Aim for 0-2 Carbs per snack if hungry  Include protein in moderation with your meals and snacks Consider reading food labels for Total Carbohydrate of foods Continue with your activity level daily as tolerated and consider checking BG after on occasion  Consider checking BG at alternate times per day   Continue taking medication as directed by MD

## 2017-12-21 NOTE — Progress Notes (Signed)
Diabetes Self-Management Education  Visit Type: First/Initial  Appt. Start Time: 1015 Appt. End Time: 6468  12/21/2017  [name], identified by name and date of birth, is a 67 y.o. female with a diagnosis of Diabetes: Type 2. Diet history obtained, indicates good variety of all food groups with excessive carbs on occasion. She states she goes to the gym twice a week and typically tests BG only in AM because after meals her BG goes too high. She has just started on Trulicity this past week and has not tested after meals since she started that.   ASSESSMENT  Height _0  (1.6 m), weight 176 lb 3.2 oz (79.9 kg). Body mass index is 31.21 kg/m.  Diabetes Self-Management Education - 12/21/17 1027      Visit Information   Visit Type  First/Initial      Initial Visit   Diabetes Type  Type 2    Are you currently following a meal plan?  No    Are you taking your medications as prescribed?  Yes    Date Diagnosed  2016      Health Coping   How would you rate your overall health?  Good      Psychosocial Assessment   Patient Belief/Attitude about Diabetes  Motivated to manage diabetes    Self-care barriers  None    Self-management support  Doctor's office    Other persons present  Patient    Patient Concerns  Nutrition/Meal planning;Glycemic Control    Special Needs  None    Learning Readiness  Change in progress    How often do you need to have someone help you when you read instructions, pamphlets, or other written materials from your doctor or pharmacy?  1 - Never    What is the last grade level you completed in school?  4 years college      Pre-Education Assessment   Patient understands the diabetes disease and treatment process.  Needs Instruction    Patient understands incorporating nutritional management into lifestyle.  Needs Instruction    Patient undertands incorporating physical activity into lifestyle.  Needs Instruction    Patient understands using medications  safely.  Needs Instruction    Patient understands monitoring blood glucose, interpreting and using results  Needs Review    Patient understands prevention, detection, and treatment of chronic complications.  Needs Review    Patient understands how to develop strategies to address psychosocial issues.  Needs Instruction    Patient understands how to develop strategies to promote health/change behavior.  Needs Instruction      Complications   Last HgB A1C per patient/outside source  8 %    How often do you check your blood sugar?  1-2 times/day    Fasting Blood glucose range (mg/dL)  70-129;130-179    Postprandial Blood glucose range (mg/dL)  130-179;180-200    Number of hypoglycemic episodes per month  0    Have you had a dilated eye exam in the past 12 months?  Yes    Have you had a dental exam in the past 12 months?  Yes    Are you checking your feet?  Yes    How many days per week are you checking your feet?  1      Dietary Intake   Breakfast  [place] sausage, waffle with regular syrup or jelly OR sausage with grits, 1 slice toast and jelly OR country ham, home fries    Lunch  salad with lettuce,  tomato, cucumber, [place], dressing OR sandwich with lean meat    Dinner  hot meal; mac and cheese, chicken, green vegetable and bread (cooks enough for several meals and freezes leftovers    Snack (evening)  chocolate ice cream a few nights a week    Beverage(s)  hot tea with 2 tsp sugar and cream, decaffeinated tea, water, fruit juice (cranberry)      Exercise   Exercise Type  Light (walking / raking leaves)    How many days per week to you exercise?  2    How many minutes per day do you exercise?  20    Total minutes per week of exercise  40      Patient Education   Previous Diabetes Education  No    Disease state   Factors that contribute to the development of diabetes    Nutrition management   Role of diet in the treatment of diabetes and the relationship between the three main  macronutrients and blood glucose level;Carbohydrate counting;Meal timing in regards to the patients' current diabetes medication.;Food label reading, portion sizes and measuring food.    Physical activity and exercise   Role of exercise on diabetes management, blood pressure control and cardiac health.    Medications  Reviewed patients medication for diabetes, action, purpose, timing of dose and side effects.    Monitoring  Purpose and frequency of SMBG.;Identified appropriate SMBG and/or A1C goals.    Chronic complications  Relationship between chronic complications and blood glucose control    Psychosocial adjustment  Role of stress on diabetes      Individualized Goals (developed by patient)   Nutrition  Follow meal plan discussed    Physical Activity  Exercise 3-5 times per week    Medications  take my medication as prescribed    Monitoring   test blood glucose pre and post meals as discussed      Post-Education Assessment   Patient understands the diabetes disease and treatment process.  Demonstrates understanding / competency    Patient understands incorporating nutritional management into lifestyle.  Demonstrates understanding / competency    Patient undertands incorporating physical activity into lifestyle.  Demonstrates understanding / competency    Patient understands using medications safely.  Demonstrates understanding / competency    Patient understands monitoring blood glucose, interpreting and using results  Demonstrates understanding / competency    Patient understands prevention, detection, and treatment of acute complications.  Demonstrates understanding / competency    Patient understands prevention, detection, and treatment of chronic complications.  Demonstrates understanding / competency    Patient understands how to develop strategies to address psychosocial issues.  Demonstrates understanding / competency    Patient understands how to develop strategies to promote  health/change behavior.  Demonstrates understanding / competency      Outcomes   Expected Outcomes  Demonstrated interest in learning. Expect positive outcomes    Future DMSE  PRN    Program Status  Completed       Individualized Plan for Diabetes Self-Management Training:   Learning Objective:  Patient will have a greater understanding of diabetes self-management. Patient education plan is to attend individual and/or group sessions per assessed needs and concerns.   Plan:   Patient Instructions  Plan:  Aim for 2-3 Carb Choices per meal (30-45 grams)  Aim for 0-2 Carbs per snack if hungry  Include protein in moderation with your meals and snacks Consider reading food labels for Total Carbohydrate of foods Continue with  your activity level daily as tolerated and consider checking BG after on occasion  Consider checking BG at alternate times per day   Continue taking medication as directed by MD  Expected Outcomes:  Demonstrated interest in learning. Expect positive outcomes  Education material provided: ADA Diabetes: Your Take Control Guide, A1C conversion sheet, Meal plan card and Carbohydrate counting sheet, Diabetes Medication handout  If problems or questions, patient to contact team via:  Phone  Future DSME appointment: PRN

## 2017-12-22 NOTE — Telephone Encounter (Signed)
Called pt on cell number that is under demographics and LM on Vm to return call to office. KG LPN

## 2017-12-22 NOTE — Telephone Encounter (Signed)
Pt returned call and notified that med sent to pharmacy with directions. Pt has picked Trulicity up and has started the medication. Has followup scheduled. KG LPN

## 2018-01-06 ENCOUNTER — Encounter: Payer: Self-pay | Admitting: Physician Assistant

## 2018-01-06 ENCOUNTER — Ambulatory Visit (INDEPENDENT_AMBULATORY_CARE_PROVIDER_SITE_OTHER): Payer: Medicare Other | Admitting: Physician Assistant

## 2018-01-06 VITALS — BP 105/70 | HR 76 | Wt 174.0 lb

## 2018-01-06 DIAGNOSIS — E6609 Other obesity due to excess calories: Secondary | ICD-10-CM

## 2018-01-06 DIAGNOSIS — E1169 Type 2 diabetes mellitus with other specified complication: Secondary | ICD-10-CM

## 2018-01-06 DIAGNOSIS — E785 Hyperlipidemia, unspecified: Secondary | ICD-10-CM | POA: Diagnosis not present

## 2018-01-06 DIAGNOSIS — Z7689 Persons encountering health services in other specified circumstances: Secondary | ICD-10-CM

## 2018-01-06 DIAGNOSIS — Z6830 Body mass index (BMI) 30.0-30.9, adult: Secondary | ICD-10-CM | POA: Diagnosis not present

## 2018-01-06 NOTE — Progress Notes (Signed)
HPI:                                                                Sara Gardner is a 67 y.o. female who presents to [facility] [place] today for medical weight loss  Current weight Weight: 174 lb (78.9 kg)   Reports appetite is curbed by Trulicity and this is helping her a lot. Fasting am glucose 98-137, in the last week 105-108. Has reduced bread, potatoes, bean and corn She went to the nutritionist and states she learned a lot. Physical activity: had to quit her job due to heavy lifting and arthritis pain, she is still able to perform light household chores    Depression screen [facility] 2/9 12/21/2017 05/12/2017  Decreased Interest 0 0  Down, Depressed, Hopeless 0 0  PHQ - 2 Score 0 0    No flowsheet data found.    Past Medical History:  Diagnosis Date  . Aortic atherosclerosis ([place]) 06/10/2017  . CAD (coronary artery disease)   . Cancer ([place])   . Cardiomegaly 06/10/2017  . 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 Dermatochalasis of both eyelids 04/07/2017  . Diabetes mellitus without complication ([place])   . Diastolic heart failure, NYHA class 1 ([place])   . GERD (gastroesophageal reflux disease)   . Hypertension   . Lumbar degenerative disc disease 03/09/2017  . Myocardial infarction ([place]) 2005  . Papillary thyroid carcinoma ([place])   . Posterior vitreous detachment, right eye 04/07/2017  . Thyroid disease    Past Surgical History:  Procedure Laterality Date  . TOTAL THYROIDECTOMY  2016   Social History   Tobacco Use  . Smoking status: Never Smoker  . Smokeless tobacco: Never Used  Substance Use Topics  . Alcohol use: No   family history is not on file.    ROS: negative except as noted in the HPI  Medications: Current Outpatient Medications  Medication Sig Dispense Refill  . acetaminophen (TYLENOL) 650 MG CR tablet Take 2 tablets (1,300 mg total) by mouth every 8 (eight) hours as needed for  pain. 90 tablet 3  . AMBULATORY NON FORMULARY MEDICATION Knee-high, medium compression, graduated compression stockings. Apply to lower extremities. [email or web address], Zippered Compression Stockings, medium circ, long length 1 each 0  . ammonium lactate (LAC-HYDRIN) 12 % lotion APP TOPICALLY PRF DRY SKIN  0  . aspirin 81 MG chewable tablet Chew by mouth.    . Calcium Carbonate-Vitamin D 600-400 MG-UNIT tablet Take 1 tablet by mouth 2 (two) times daily. (Patient not taking: Reported on 12/21/2017) 60 tablet 11  . conjugated estrogens (PREMARIN) vaginal cream Insert 0.5 g intravaginally daily for 1 week, then twice weekly (Patient not taking: Reported on 12/21/2017) 30 g 5  . diclofenac sodium (VOLTAREN) 1 % GEL Apply 4 g topically 4 (four) times daily. To affected joint. 100 g 1  . Dulaglutide (TRULICITY) 5.18 AC/1.6SA SOPN Inject 0.5 mLs into the skin once a week. 4 pen 3  . EPINEPHrine 0.3 mg/0.3 mL IJ SOAJ injection Inject 1 each as directed as needed. 1 Device 1  . esomeprazole (NEXIUM) 20 MG capsule TAKE 1 CAPSULE(20 MG) BY MOUTH DAILY 90 capsule 0  . fluconazole (DIFLUCAN) 150 MG tablet Take 1 tablet  as a single dose    . gabapentin (NEURONTIN) 300 MG capsule Take by mouth.    . Lancets (ONETOUCH ULTRASOFT) lancets Check fasting blood sugar daily and 2 hours after largest meal of the day. Dx DM. 100 each prn  . levothyroxine (SYNTHROID, LEVOTHROID) 100 MCG tablet Take 100 mcg by mouth daily.  1  . liothyronine (CYTOMEL) 25 MCG tablet Take by mouth.    Marland Kitchen lisinopril-hydrochlorothiazide (PRINZIDE,ZESTORETIC) 20-12.5 MG tablet Take 1 tablet by mouth daily. Need follow up with PCP 90 tablet 0  . metFORMIN (GLUCOPHAGE) 1000 MG tablet TAKE 1 TABLET BY MOUTH EVERY MORNING 90 tablet 0  . metoprolol succinate (TOPROL-XL) 50 MG 24 hr tablet Take 1 tablet (50 mg total) by mouth daily. 90 tablet 1  . moxifloxacin (VIGAMOX) 0.5 % ophthalmic solution Place 1 drop into the left eye 3 (three) times a day.  START 2 DAYS PRIOR TO SURGERY RIGHT EYE    . nitroGLYCERIN (NITROSTAT) 0.4 MG SL tablet Place 1 tablet (0.4 mg total) every 5 (five) minutes as needed under the tongue for chest pain. Max dose 3 tablets in 15 minutes 20 tablet 1  . ONE TOUCH ULTRA TEST test strip CHECK FASTING BLOOD SUGAR DAILY AND 2 HOURS AFTER LARGEST MEAL OF THE DAY 100 each 1  . ONETOUCH DELICA LANCETS FINE MISC USE TO CHECK FASTING BLOOD SUGAR DAILY AND 2 HOURS AFTER LARGEST MEAL OF THE DAY. Schedule follow up with PCP 100 each 0  . prednisoLONE acetate (PRED FORTE) 1 % ophthalmic suspension     . rosuvastatin (CRESTOR) 20 MG tablet Take 1 tablet (20 mg total) by mouth daily. 90 tablet 3   No current facility-administered medications for this visit.    Allergies  Allergen Reactions  . Bee Venom Swelling  . Tuberculin Tests Other (See Comments)  . Tape Rash       Objective:  BP 105/70   Pulse 76   Wt 174 lb (78.9 kg)   BMI 30.82 kg/m  Gen:  alert, not ill-appearing, no distress, appropriate for age, obese female HEENT: head normocephalic without obvious abnormality, conjunctiva and cornea clear, trachea midline Pulm: Normal work of breathing, normal phonation Neuro: alert and oriented x 3, no tremor MSK: extremities atraumatic, normal gait and station Skin: intact, no rashes on exposed skin, no jaundice, no cyanosis Psych: well-groomed, cooperative, good eye contact, euthymic mood, affect mood-congruent, speech is articulate, and thought processes clear and goal-directed    No results found for this or any previous visit (from the past 72 hour(s)). No results found.    Assessment and Plan: 67 y.o. female with   DM type 2 with diabetic dyslipidemia ([place])  Class 1 obesity due to excess calories with serious comorbidity and body mass index (BMI) of 30.0 to 30.9 in adult  Encounter for weight management  Wt Readings from Last 3 Encounters:  01/06/18 174 lb (78.9 kg)  12/21/17 176 lb 3.2 oz (79.9 kg)   12/08/17 177 lb (80.3 kg)  - she is down 3 lb in 4 weeks, applauded her for this - fasting glucose <130, no hypoglycemia with Trulicity - she followed up with the nutritionist and has made healthy lifestyle changes  Patient education and anticipatory guidance given Patient agrees with treatment plan Follow-up in 8 weeks or sooner as needed if symptoms worsen or fail to improve  Darlyne Russian PA-C

## 2018-01-21 ENCOUNTER — Other Ambulatory Visit: Payer: Self-pay | Admitting: Physician Assistant

## 2018-02-19 ENCOUNTER — Other Ambulatory Visit: Payer: Self-pay | Admitting: Physician Assistant

## 2018-02-19 DIAGNOSIS — E1169 Type 2 diabetes mellitus with other specified complication: Secondary | ICD-10-CM

## 2018-02-19 DIAGNOSIS — E785 Hyperlipidemia, unspecified: Principal | ICD-10-CM

## 2018-02-25 IMAGING — DX DG LUMBAR SPINE COMPLETE 4+V
5 series · 5 of 5 positions shown · non-contrast
Comparison: None.

CLINICAL DATA: Acute lower back pain without sciatica without known
injury.

EXAM:
LUMBAR SPINE - COMPLETE 4+ VIEW

[l-spine ap]
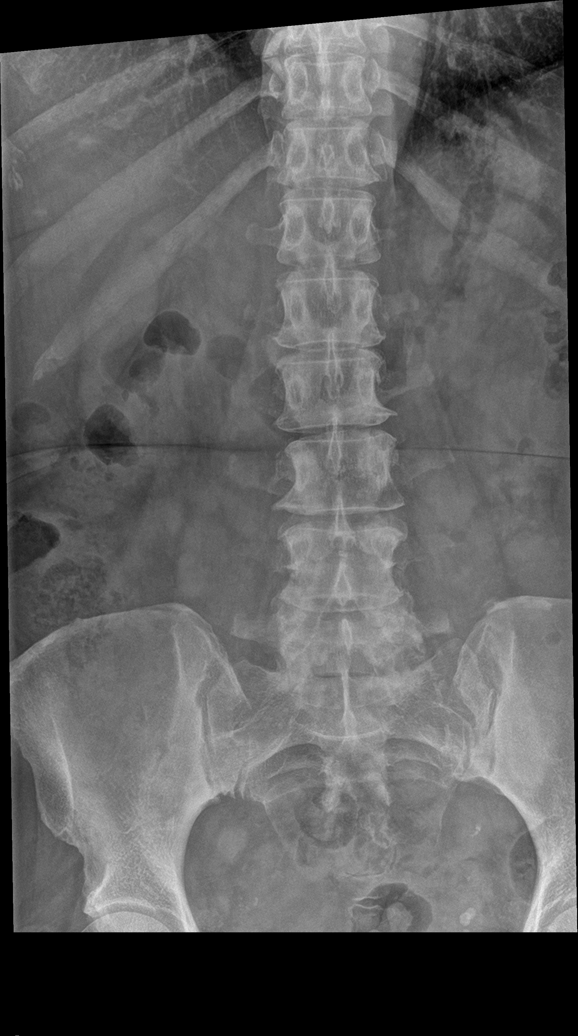

[l-spine obl (1 of 2)]
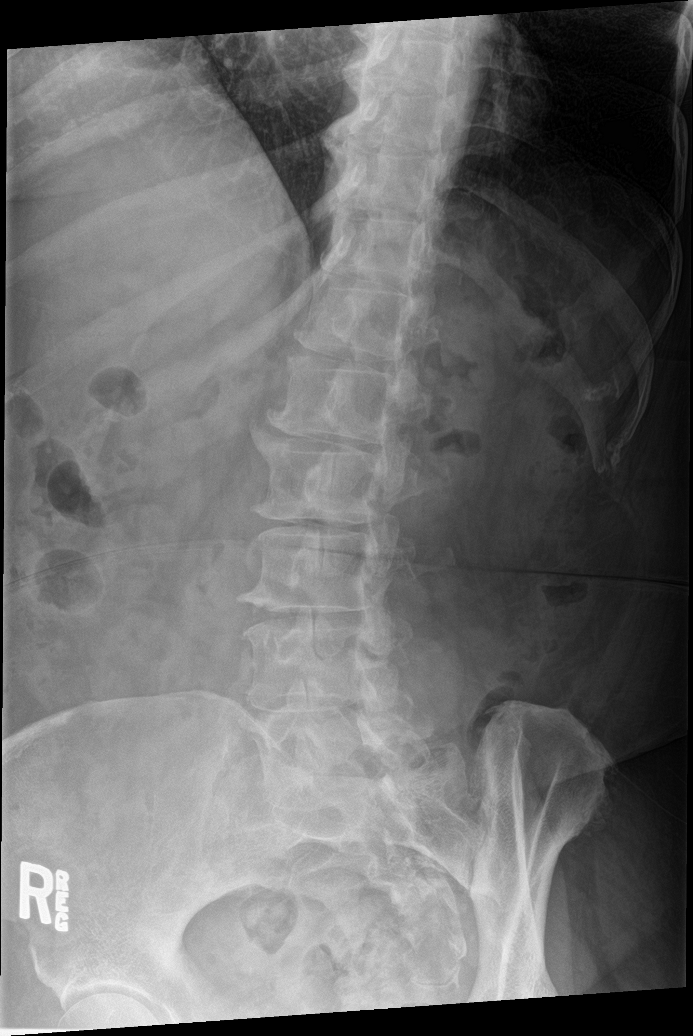

[l-spine obl (2 of 2)]
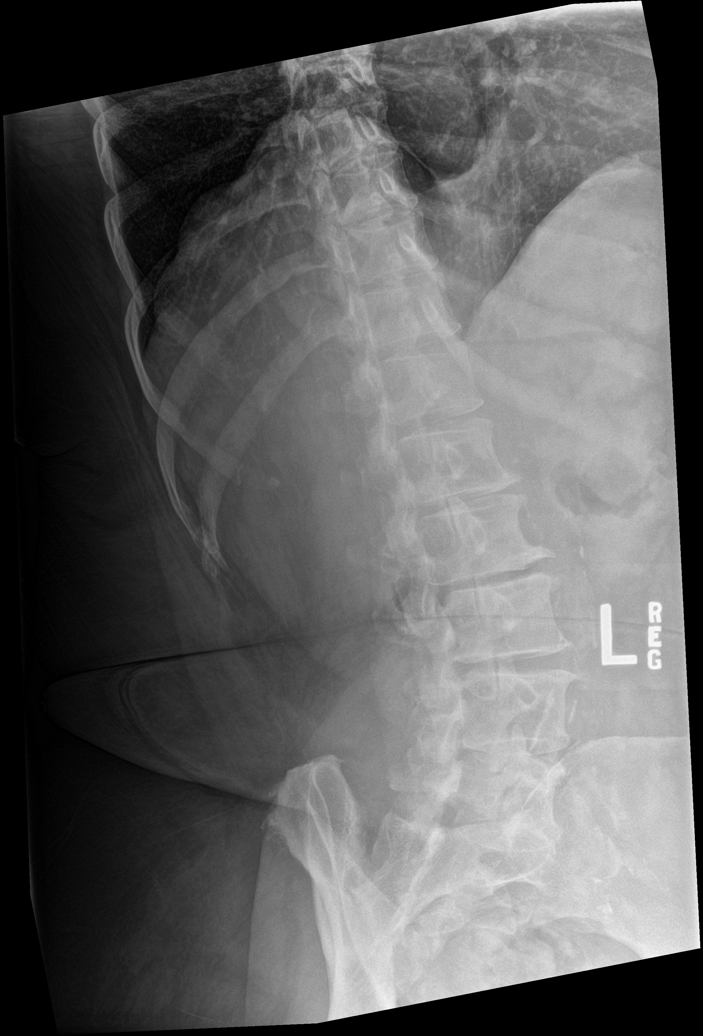

[l-spine lat]
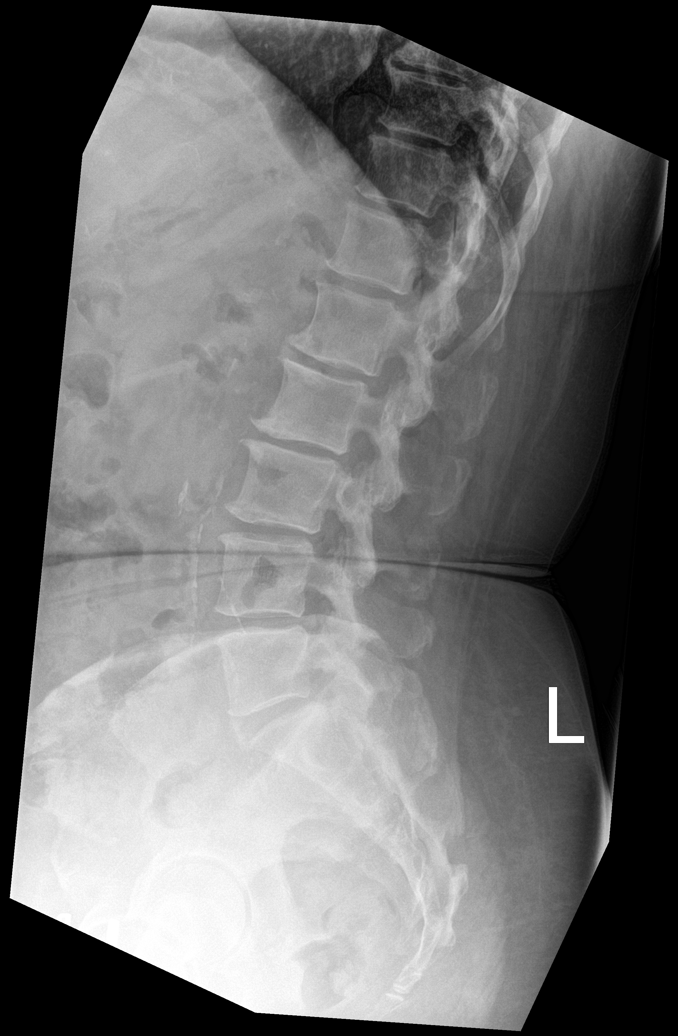

[l-spine spot]
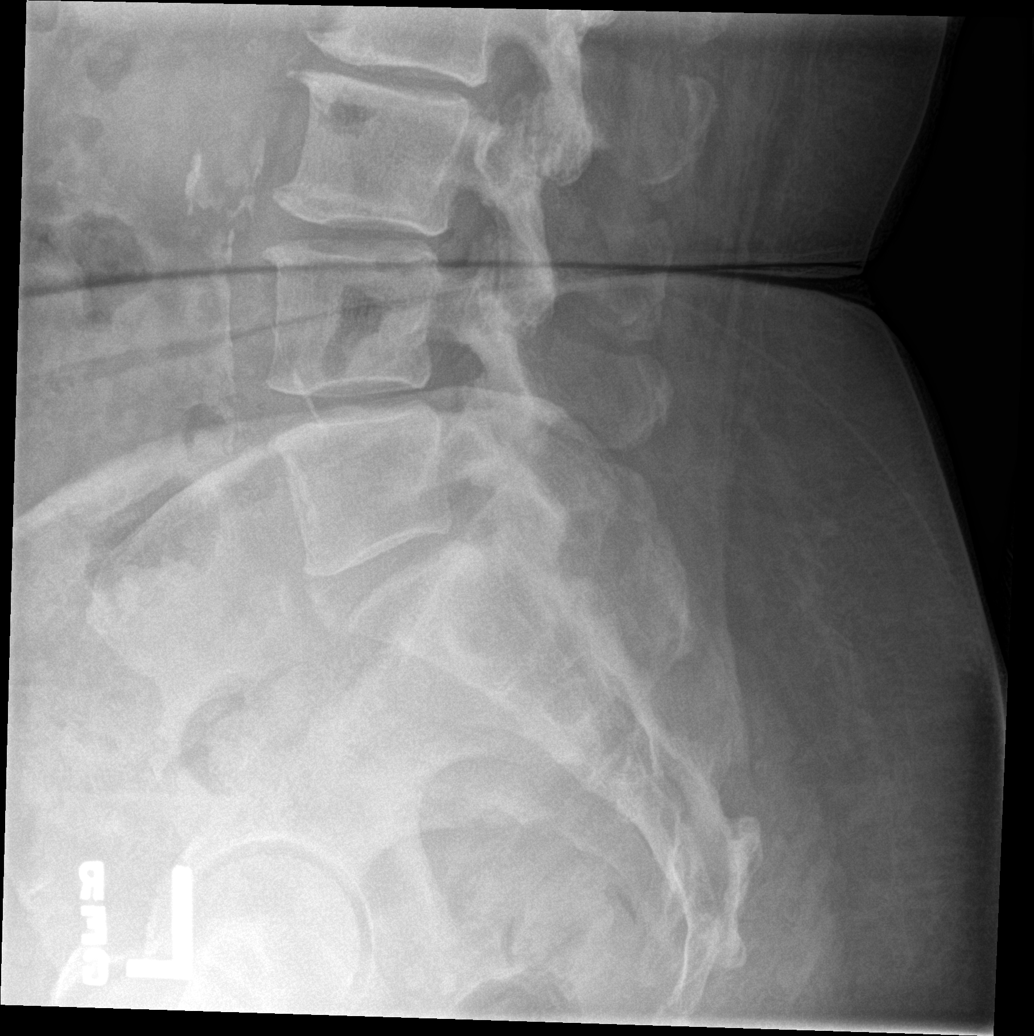

[5 of 5 positions shown; findings below may reference images not displayed]

FINDINGS: No fracture or spondylolisthesis is noted. Mild degenerative disc
disease is noted at L1-2, L2-3 and L3-4 with anterior osteophyte
formation. Atherosclerosis of abdominal aorta is noted. Posterior
facet joints are unremarkable.
IMPRESSION: Multilevel degenerative disc disease. Aortic atherosclerosis. No
acute abnormality seen in the lumbar spine.

## 2018-03-02 ENCOUNTER — Emergency Department (INDEPENDENT_AMBULATORY_CARE_PROVIDER_SITE_OTHER)
Admission: EM | Admit: 2018-03-02 | Discharge: 2018-03-02 | Disposition: A | Discharge status: Home / Self Care | Payer: Medicare Other | Source: Home / Self Care

## 2018-03-02 ENCOUNTER — Encounter: Payer: Self-pay | Admitting: *Deleted

## 2018-03-02 ENCOUNTER — Other Ambulatory Visit: Payer: Self-pay

## 2018-03-02 ENCOUNTER — Other Ambulatory Visit: Payer: Self-pay | Admitting: Physician Assistant

## 2018-03-02 DIAGNOSIS — M779 Enthesopathy, unspecified: Secondary | ICD-10-CM

## 2018-03-02 DIAGNOSIS — I10 Essential (primary) hypertension: Secondary | ICD-10-CM

## 2018-03-02 DIAGNOSIS — M7750 Other enthesopathy of unspecified foot: Secondary | ICD-10-CM

## 2018-03-02 MED ORDER — DICLOFENAC SODIUM 75 MG PO TBEC: 75.0000 mg | DELAYED_RELEASE_TABLET | Freq: Two times a day (BID) | ORAL | 0 refills | Status: DC

## 2018-03-02 NOTE — ED Triage Notes (Signed)
Patient c/o left foot pain and swelling  x 2 days without injury.

## 2018-03-02 NOTE — Discharge Instructions (Addendum)
Take the diclofenac with food.

## 2018-03-02 NOTE — ED Provider Notes (Addendum)
[place]   [id number] 03/02/18 Arrival Time: 2423   SUBJECTIVE:  Sara Gardner is a 67 y.o. female who presents to the urgent care with complaint of left foot pain and swelling  x 2 days without injury.   Patient has been walking more in the last 2 weeks.  She took a long 2 to 3 mile walk earlier this week and gradually developed pain over the dorsal of her left foot.     Past Medical History:  Diagnosis Date  . Aortic atherosclerosis ([place]) 06/10/2017  . CAD (coronary artery disease)   . Cancer ([place])   . Cardiomegaly 06/10/2017  . 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 Dermatochalasis of both eyelids 04/07/2017  . Diabetes mellitus without complication ([place])   . Diastolic heart failure, NYHA class 1 ([place])   . GERD (gastroesophageal reflux disease)   . Hypertension   . Lumbar degenerative disc disease 03/09/2017  . Myocardial infarction ([place]) 2005  . Papillary thyroid carcinoma ([place])   . Posterior vitreous detachment, right eye 04/07/2017  . Thyroid disease    History reviewed. No pertinent family history. Social History   Socioeconomic History  . Marital status: Single    Spouse name: Not on file  . Number of children: Not on file  . Years of education: Not on file  . Highest education level: Not on file  Occupational History  . Not on file  Social Needs  . Financial resource strain: Not on file  . Food insecurity:    Worry: Not on file    Inability: Not on file  . Transportation needs:    Medical: Not on file    Non-medical: Not on file  Tobacco Use  . Smoking status: Never Smoker  . Smokeless tobacco: Never Used  Substance and Sexual Activity  . Alcohol use: No  . Drug use: No  . Sexual activity: Not Currently  Lifestyle  . Physical activity:    Days per week: Not on file    Minutes per session: Not on file  . Stress: Not on file  Relationships  . Social connections:    Talks on phone: Not on file    Gets  together: Not on file    Attends religious service: Not on file    Active member of club or organization: Not on file    Attends meetings of clubs or organizations: Not on file    Relationship status: Not on file  . Intimate partner violence:    Fear of current or ex partner: Not on file    Emotionally abused: Not on file    Physically abused: Not on file    Forced sexual activity: Not on file  Other Topics Concern  . Not on file  Social History Narrative  . Not on file   No outpatient medications have been marked as taking for the 03/02/18 encounter [facility] Encounter).   Allergies  Allergen Reactions  . Bee Venom Swelling  . Tuberculin Tests Other (See Comments)  . Tape Rash      ROS: As per HPI, remainder of ROS negative.   OBJECTIVE:   Vitals:   03/02/18 1613  BP: 101/68  Pulse: 81  SpO2: 96%  Weight: 77.6 kg     General appearance: alert; no distress Eyes: PERRL; EOMI; conjunctiva normal HENT: normocephalic; atraumatic; oral mucosa normal Neck: supple Back: no CVA tenderness Extremities: no cyanosis or edema; symmetrical with no gross deformities; tender dorsum of left  foot with no obvious swelling. Skin: warm and dry Neurologic: antalgic gait favoring right leg; grossly normal Psychological: alert and cooperative; normal mood and affect      Labs:  Results for orders placed or performed in visit on 12/07/17  Hemoglobin A1c  Result Value Ref Range   A1c 8.0     Labs Reviewed - No data to display  No results found.     ASSESSMENT & PLAN:  1. Foot tendinitis     Meds ordered this encounter  Medications  . diclofenac (VOLTAREN) 75 MG EC tablet    Sig: Take 1 tablet (75 mg total) by mouth 2 (two) times daily.    Dispense:  14 tablet    Refill:  0    Reviewed expectations re: course of current medical issues. Questions answered. Outlined signs and symptoms indicating need for more acute intervention. Patient verbalized  understanding. After Visit Summary given.    Procedures:      Robyn Haber, MD 03/02/18 1625    Robyn Haber, MD 03/02/18 [phone]

## 2018-03-04 ENCOUNTER — Other Ambulatory Visit: Payer: Self-pay | Admitting: Physician Assistant

## 2018-03-04 DIAGNOSIS — E785 Hyperlipidemia, unspecified: Principal | ICD-10-CM

## 2018-03-04 DIAGNOSIS — E1169 Type 2 diabetes mellitus with other specified complication: Secondary | ICD-10-CM

## 2018-03-05 ENCOUNTER — Other Ambulatory Visit: Payer: Self-pay | Admitting: Physician Assistant

## 2018-03-05 DIAGNOSIS — K219 Gastro-esophageal reflux disease without esophagitis: Secondary | ICD-10-CM

## 2018-03-08 ENCOUNTER — Ambulatory Visit (INDEPENDENT_AMBULATORY_CARE_PROVIDER_SITE_OTHER): Payer: Medicare Other | Admitting: Physician Assistant

## 2018-03-08 ENCOUNTER — Encounter: Payer: Self-pay | Admitting: Physician Assistant

## 2018-03-08 VITALS — BP 96/68 | HR 89 | Wt 170.0 lb

## 2018-03-08 DIAGNOSIS — E785 Hyperlipidemia, unspecified: Secondary | ICD-10-CM | POA: Diagnosis not present

## 2018-03-08 DIAGNOSIS — E6609 Other obesity due to excess calories: Secondary | ICD-10-CM

## 2018-03-08 DIAGNOSIS — E119 Type 2 diabetes mellitus without complications: Secondary | ICD-10-CM | POA: Diagnosis not present

## 2018-03-08 DIAGNOSIS — Z6830 Body mass index (BMI) 30.0-30.9, adult: Secondary | ICD-10-CM

## 2018-03-08 DIAGNOSIS — E1169 Type 2 diabetes mellitus with other specified complication: Secondary | ICD-10-CM | POA: Diagnosis not present

## 2018-03-08 LAB — POCT GLYCOSYLATED HEMOGLOBIN (HGB A1C): HbA1c, POC (controlled diabetic range): 6.7 % (ref 0.0–7.0)

## 2018-03-08 MED ORDER — DULAGLUTIDE 0.75 MG/0.5ML ~~LOC~~ SOAJ: 1.5000 mg | SUBCUTANEOUS | 1 refills | Status: DC

## 2018-03-08 NOTE — Patient Instructions (Signed)
Diabetes Preventive Care: - annual foot exam  - annual dilated eye exam with an eye doctor - self foot exams at least weekly - pneumonia vaccine once (booster in 5 years and at age 67) - annual influenza vaccine - twice yearly dental cleanings and yearly exam - goal blood pressure <140/90, ideally <130/80 - LDL cholesterol <70 - A1C <7.0 - body mass index (BMI) <25.0 - follow-up every 3 months if your A1C is not at goal - follow-up every 6 months if diabetes is well controlled  

## 2018-03-08 NOTE — Progress Notes (Signed)
HPI:                                                                Sara Gardner is a 67 y.o. female who presents to [facility]: [place] today for diabetes follow-up  Stated on Trulicity 2 months ago to assist with weight loss in addition to glycemic control. Last A1C 8.0. Current weight 170, down about 6 pounds over 8 weeks. Doing well. Denies any nausea/GI upset.  CAD: followed by Cardiology. Stable, no angina.   Recently evaluated in the urgent care on 03/02/18 for left foot pain/swelling. Diagnosed with foot tendinitis, treated with topical Diclofenac. Reports this has improved.   Depression screen [facility] 2/9 12/21/2017 05/12/2017  Decreased Interest 0 0  Down, Depressed, Hopeless 0 0  PHQ - 2 Score 0 0    No flowsheet data found.    Past Medical History:  Diagnosis Date  . Aortic atherosclerosis ([place]) 06/10/2017  . CAD (coronary artery disease)   . Cancer ([place])   . Cardiomegaly 06/10/2017  . 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 Dermatochalasis of both eyelids 04/07/2017  . Diabetes mellitus without complication ([place])   . Diastolic heart failure, NYHA class 1 ([place])   . GERD (gastroesophageal reflux disease)   . Hypertension   . Lumbar degenerative disc disease 03/09/2017  . Myocardial infarction ([place]) 2005  . Papillary thyroid carcinoma ([place])   . Posterior vitreous detachment, right eye 04/07/2017  . Thyroid disease    Past Surgical History:  Procedure Laterality Date  . TOTAL THYROIDECTOMY  2016   Social History   Tobacco Use  . Smoking status: Never Smoker  . Smokeless tobacco: Never Used  Substance Use Topics  . Alcohol use: No   family history is not on file.    ROS: negative except as noted in the HPI  Medications: Current Outpatient Medications  Medication Sig Dispense Refill  . AMBULATORY NON FORMULARY MEDICATION Knee-high, medium compression, graduated compression stockings. Apply  to lower extremities. [email or web address], Zippered Compression Stockings, medium circ, long length 1 each 0  . ammonium lactate (LAC-HYDRIN) 12 % lotion APP TOPICALLY PRF DRY SKIN  0  . aspirin 81 MG chewable tablet Chew by mouth.    . diclofenac (VOLTAREN) 75 MG EC tablet Take 1 tablet (75 mg total) by mouth 2 (two) times daily. 14 tablet 0  . diclofenac sodium (VOLTAREN) 1 % GEL Apply 4 g topically 4 (four) times daily. To affected joint. 100 g 1  . Dulaglutide (TRULICITY) 5.59 RC/1.6LA SOPN Inject 1.5 mg as directed once a week. 12 pen 1  . EPINEPHrine 0.3 mg/0.3 mL IJ SOAJ injection Inject 1 each as directed as needed. 1 Device 1  . esomeprazole (NEXIUM) 20 MG capsule TAKE 1 CAPSULE(20 MG) BY MOUTH DAILY 90 capsule 0  . fluconazole (DIFLUCAN) 150 MG tablet Take 1 tablet as a single dose    . Lancets (ONETOUCH ULTRASOFT) lancets Check fasting blood sugar daily and 2 hours after largest meal of the day. Dx DM. 100 each prn  . levothyroxine (SYNTHROID, LEVOTHROID) 100 MCG tablet Take 100 mcg by mouth daily.  1  . lisinopril-hydrochlorothiazide (PRINZIDE,ZESTORETIC) 20-12.5 MG tablet Take 1 tablet by mouth daily. 90 tablet 1  .  metFORMIN (GLUCOPHAGE) 1000 MG tablet TAKE 1 TABLET BY MOUTH EVERY MORNING 90 tablet 0  . metoprolol succinate (TOPROL-XL) 50 MG 24 hr tablet Take 1 tablet (50 mg total) by mouth daily. 90 tablet 1  . nitroGLYCERIN (NITROSTAT) 0.4 MG SL tablet Place 1 tablet (0.4 mg total) every 5 (five) minutes as needed under the tongue for chest pain. Max dose 3 tablets in 15 minutes 20 tablet 1  . ONE TOUCH ULTRA TEST test strip CHECK FASTING BLOOD SUGAR EVERY DAY AND 2 HOURS AFTER LARGEST MEAL OF THE DAY 100 each 1  . ONETOUCH DELICA LANCETS FINE MISC USE TO CHECK FASTING BLOOD SUGAR DAILY AND 2 HOURS AFTER LARGEST MEAL OF THE DAY 100 each 0  . rosuvastatin (CRESTOR) 20 MG tablet Take 1 tablet (20 mg total) by mouth daily. 90 tablet 3   No current facility-administered medications  for this visit.    Allergies  Allergen Reactions  . Bee Venom Swelling  . Tuberculin Tests Other (See Comments)  . Tape Rash       Objective:  BP 96/68   Pulse 89   Wt 170 lb (77.1 kg)   BMI 30.11 kg/m  Gen:  alert, not ill-appearing, no distress, appropriate for age, obese female HEENT: head normocephalic without obvious abnormality, conjunctiva and cornea clear, wearing glasses, trachea midline Pulm: Normal work of breathing, normal phonation Neuro: alert and oriented x 3, no tremor MSK: extremities atraumatic, normal gait and station Skin: intact, no rashes on exposed skin, no jaundice, no cyanosis Psych: well-groomed, cooperative, good eye contact, euthymic mood, affect mood-congruent, speech is articulate, and thought processes clear and goal-directed    Results for orders placed or performed in visit on 03/08/18 (from the past 72 hour(s))  POCT HgB A1C     Status: None   Collection Time: 03/08/18  8:30 AM  Result Value Ref Range   Hemoglobin A1C     HbA1c POC (<> result, manual entry)     HbA1c, POC (prediabetic range)     HbA1c, POC (controlled diabetic range) 6.7 0.0 - 7.0 %   No results found.    Assessment and Plan: 67 y.o. female with   Sara Gardner was seen today for medication management.  Diagnoses and all orders for this visit:  Controlled type 2 diabetes mellitus without complication, without long-term current use of insulin (HCC)  Dyslipidemia associated with type 2 diabetes mellitus (HCC) -     POCT HgB A1C -     Dulaglutide (TRULICITY) 8.12 XN/1.7GY SOPN; Inject 1.5 mg as directed once a week.  Class 1 obesity due to excess calories with serious comorbidity and body mass index (BMI) of 30.0 to 30.9 in adult   DM2:  Lab Results  Component Value Date   HGBA1C 6.7 03/08/2018  - well controlled - she would like to increase Trulicity for additional weight loss - requesting diabetic eye exam from Hildred Priest, MD - LDL goal <70, cont Crestor  20 mg, repeat fasting lipids in 3 months - BP at goal <140/90, cont ACE - discussed diabetes preventive care  Instructed to schedule Medicare wellness for this year  Patient education and anticipatory guidance given Patient agrees with treatment plan Follow-up in 6 months for diabetes or sooner as needed if symptoms worsen or fail to improve  Darlyne Russian PA-C

## 2018-03-18 ENCOUNTER — Other Ambulatory Visit: Payer: Self-pay | Admitting: Physician Assistant

## 2018-03-19 ENCOUNTER — Other Ambulatory Visit: Payer: Self-pay | Admitting: Physician Assistant

## 2018-03-21 ENCOUNTER — Ambulatory Visit (INDEPENDENT_AMBULATORY_CARE_PROVIDER_SITE_OTHER): Payer: Medicare Other | Admitting: Physician Assistant

## 2018-03-21 ENCOUNTER — Encounter: Payer: Self-pay | Admitting: Physician Assistant

## 2018-03-21 VITALS — BP 138/82 | HR 76 | Wt 171.0 lb

## 2018-03-21 DIAGNOSIS — R319 Hematuria, unspecified: Secondary | ICD-10-CM | POA: Diagnosis not present

## 2018-03-21 DIAGNOSIS — E6609 Other obesity due to excess calories: Secondary | ICD-10-CM

## 2018-03-21 DIAGNOSIS — Z113 Encounter for screening for infections with a predominantly sexual mode of transmission: Secondary | ICD-10-CM

## 2018-03-21 DIAGNOSIS — R300 Dysuria: Secondary | ICD-10-CM | POA: Diagnosis not present

## 2018-03-21 DIAGNOSIS — Z0000 Encounter for general adult medical examination without abnormal findings: Secondary | ICD-10-CM | POA: Diagnosis not present

## 2018-03-21 DIAGNOSIS — R809 Proteinuria, unspecified: Secondary | ICD-10-CM | POA: Diagnosis not present

## 2018-03-21 DIAGNOSIS — N952 Postmenopausal atrophic vaginitis: Secondary | ICD-10-CM

## 2018-03-21 DIAGNOSIS — Z6830 Body mass index (BMI) 30.0-30.9, adult: Secondary | ICD-10-CM

## 2018-03-21 LAB — POCT URINALYSIS DIPSTICK
Bilirubin, UA: NEGATIVE
Glucose, UA: NEGATIVE
Ketones, UA: NEGATIVE
LEUKOCYTES UA: NEGATIVE
NITRITE UA: NEGATIVE
PROTEIN UA: POSITIVE — AB
SPEC GRAV UA: 1.02 (ref 1.010–1.025)
Urobilinogen, UA: 0.2 E.U./dL
pH, UA: 6.5 (ref 5.0–8.0)

## 2018-03-21 MED ORDER — CEFUROXIME AXETIL 250 MG PO TABS: 250.0000 mg | ORAL_TABLET | Freq: Two times a day (BID) | ORAL | 0 refills | Status: AC

## 2018-03-21 NOTE — Progress Notes (Signed)
Subjective:    Sara Gardner is a 67 y.o. female who presents for Medicare Annual/Subsequent preventive examination.  Reports discolored urine and dysuria after masturbating last night. States she inserted both her fingers and a toy vaginally. Immediatelly following she had pain and dark, discolored urine. Associated with urinary urgency. She denies vaginal bleeding. Denies fever, chills, abdominal pain.  Preventive Screening-Counseling & Management  Tobacco Social History   Tobacco Use  Smoking Status Never Smoker  Smokeless Tobacco Never Used     Problems Prior to Visit   Current Problems (verified) Patient Active Problem List   Diagnosis Date Noted  . Proteinuria 03/21/2018  . Hematuria 03/21/2018  . Controlled type 2 diabetes mellitus without complication, without long-term current use of insulin ([place]) 03/08/2018  . Encounter for weight management 01/06/2018  . Urge incontinence 08/23/2017  . 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/23/2017  . Urinary frequency 08/17/2017  . Overactive bladder 08/17/2017  . Equinus contracture of ankle 07/27/2017  . Hammer toes of both feet 07/27/2017  . Dermatophytosis, nail 07/27/2017  . Calcaneal spur of both feet 07/27/2017  . Dysuria 07/07/2017  . Atypical chest pain 06/10/2017  . Aortic atherosclerosis ([place]) 06/10/2017  . Cardiomegaly 06/10/2017  . Vitreous detachment of right eye 05/12/2017  . 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 Dermatochalasis of both eyelids 04/07/2017  . Posterior vitreous detachment, right eye 04/07/2017  . Acute bilateral low back pain without sciatica 03/09/2017  . 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 Lumbar degenerative disc disease 03/09/2017  . Acute right flank pain 03/07/2017  . Primary osteoarthritis of left knee 03/04/2017  . Chronic pain of left knee 03/04/2017  . Bilateral primary osteoarthritis of knee 12/15/2016  . Bilateral  lower extremity edema 12/15/2016  . Allergy to honey bee venom 12/15/2016  . Cyst of right kidney 07/09/2016  . Essential hypertension 02/16/2016  . DM type 2 with diabetic dyslipidemia ([place]) 02/10/2016  . Gastroesophageal reflux disease without esophagitis 02/10/2016  . Breast pain, left 12/30/2015  . 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 Diabetic sensorimotor polyneuropathy ([place]) 10/27/2015  . Papillary thyroid carcinoma ([place]) 08/09/2014  . Post-menopausal atrophic vaginitis 06/24/2014  . Left thyroid nodule 01/29/2014  . Nodule of left lung 01/21/2014  . Class 1 obesity due to excess calories with serious comorbidity and body mass index (BMI) of 30.0 to 30.9 in adult 06/12/2012  . Palpitations 11/29/2011  . Abnormal mammogram of right breast 08/30/2011  . Arthritis of left knee 05/24/2011  . Coronary artery disease involving native coronary artery of native heart with angina pectoris ([place]) 05/24/2011  . History of acute myocardial infarction of inferior wall 05/24/2011    Medications Prior to Visit Current Outpatient Medications on File Prior to Visit  Medication Sig Dispense Refill  . AMBULATORY NON FORMULARY MEDICATION Knee-high, medium compression, graduated compression stockings. Apply to lower extremities. [email or web address], Zippered Compression Stockings, medium circ, long length 1 each 0  . ammonium lactate (LAC-HYDRIN) 12 % lotion APP TOPICALLY PRF DRY SKIN  0  . aspirin 81 MG chewable tablet Chew by mouth.    . diclofenac sodium (VOLTAREN) 1 % GEL Apply 4 g topically 4 (four) times daily. To affected joint. 100 g 1  . Dulaglutide (TRULICITY) 5.10 CH/8.5ID SOPN Inject 1.5 mg as directed once a week. 12 pen 1  . EPINEPHrine 0.3 mg/0.3 mL IJ SOAJ injection Inject 1 each as directed as needed. 1 Device 1  . esomeprazole (NEXIUM) 20 MG capsule TAKE  1 CAPSULE(20 MG) BY MOUTH DAILY 90 capsule 0  . Lancets (ONETOUCH ULTRASOFT) lancets Check fasting blood sugar daily and 2  hours after largest meal of the day. Dx DM. 100 each prn  . levothyroxine (SYNTHROID, LEVOTHROID) 100 MCG tablet Take 100 mcg by mouth daily.  1  . lisinopril-hydrochlorothiazide (PRINZIDE,ZESTORETIC) 20-12.5 MG tablet Take 1 tablet by mouth daily. 90 tablet 1  . metFORMIN (GLUCOPHAGE) 1000 MG tablet TAKE 1 TABLET BY MOUTH EVERY MORNING 90 tablet 0  . metoprolol succinate (TOPROL-XL) 50 MG 24 hr tablet TAKE 1 TABLET(50 MG) BY MOUTH DAILY 90 tablet 0  . nitroGLYCERIN (NITROSTAT) 0.4 MG SL tablet Place 1 tablet (0.4 mg total) every 5 (five) minutes as needed under the tongue for chest pain. Max dose 3 tablets in 15 minutes 20 tablet 1  . ONE TOUCH ULTRA TEST test strip CHECK FASTING BLOOD SUGAR EVERY DAY AND 2 HOURS AFTER LARGEST MEAL OF THE DAY 100 each 1  . ONETOUCH DELICA LANCETS FINE MISC USE TO CHECK FASTING BLOOD SUGAR DAILY AND 2 HOURS AFTER LARGEST MEAL OF THE DAY 100 each 0  . rosuvastatin (CRESTOR) 20 MG tablet Take 1 tablet (20 mg total) by mouth daily. 90 tablet 3   No current facility-administered medications on file prior to visit.     Current Medications (verified) Current Outpatient Medications  Medication Sig Dispense Refill  . AMBULATORY NON FORMULARY MEDICATION Knee-high, medium compression, graduated compression stockings. Apply to lower extremities. [email or web address], Zippered Compression Stockings, medium circ, long length 1 each 0  . ammonium lactate (LAC-HYDRIN) 12 % lotion APP TOPICALLY PRF DRY SKIN  0  . aspirin 81 MG chewable tablet Chew by mouth.    . diclofenac sodium (VOLTAREN) 1 % GEL Apply 4 g topically 4 (four) times daily. To affected joint. 100 g 1  . Dulaglutide (TRULICITY) 4.78 GN/5.6OZ SOPN Inject 1.5 mg as directed once a week. 12 pen 1  . EPINEPHrine 0.3 mg/0.3 mL IJ SOAJ injection Inject 1 each as directed as needed. 1 Device 1  . esomeprazole (NEXIUM) 20 MG capsule TAKE 1 CAPSULE(20 MG) BY MOUTH DAILY 90 capsule 0  . Lancets (ONETOUCH ULTRASOFT)  lancets Check fasting blood sugar daily and 2 hours after largest meal of the day. Dx DM. 100 each prn  . levothyroxine (SYNTHROID, LEVOTHROID) 100 MCG tablet Take 100 mcg by mouth daily.  1  . lisinopril-hydrochlorothiazide (PRINZIDE,ZESTORETIC) 20-12.5 MG tablet Take 1 tablet by mouth daily. 90 tablet 1  . metFORMIN (GLUCOPHAGE) 1000 MG tablet TAKE 1 TABLET BY MOUTH EVERY MORNING 90 tablet 0  . metoprolol succinate (TOPROL-XL) 50 MG 24 hr tablet TAKE 1 TABLET(50 MG) BY MOUTH DAILY 90 tablet 0  . nitroGLYCERIN (NITROSTAT) 0.4 MG SL tablet Place 1 tablet (0.4 mg total) every 5 (five) minutes as needed under the tongue for chest pain. Max dose 3 tablets in 15 minutes 20 tablet 1  . ONE TOUCH ULTRA TEST test strip CHECK FASTING BLOOD SUGAR EVERY DAY AND 2 HOURS AFTER LARGEST MEAL OF THE DAY 100 each 1  . ONETOUCH DELICA LANCETS FINE MISC USE TO CHECK FASTING BLOOD SUGAR DAILY AND 2 HOURS AFTER LARGEST MEAL OF THE DAY 100 each 0  . rosuvastatin (CRESTOR) 20 MG tablet Take 1 tablet (20 mg total) by mouth daily. 90 tablet 3  . cefUROXime (CEFTIN) 250 MG tablet Take 1 tablet (250 mg total) by mouth 2 (two) times daily with a meal for 7 days. 14 tablet 0   No  current facility-administered medications for this visit.      Allergies (verified) Bee venom; Tuberculin tests; and Tape   PAST HISTORY  Family History Family History  Problem Relation Age of Onset  . Hypertension Mother   . Hypertension Sister   . Hypertension Brother   . Breast cancer Daughter   . Hypertension Sister     Social History Social History   Tobacco Use  . Smoking status: Never Smoker  . Smokeless tobacco: Never Used  Substance Use Topics  . Alcohol use: No     Are there smokers in your home (other than you)? No  Risk Factors Current exercise habits: Home exercise routine includes walking 0.5-1 hrs per day.  Dietary issues discussed: ADA diet   Cardiac risk factors: advanced age (older than 30 for men, 63 for  women), diabetes mellitus, dyslipidemia, hypertension, microalbuminuria, obesity (BMI >= 30 kg/m2) and sedentary lifestyle.  Depression Screen (Note: if answer to either of the following is "Yes", a more complete depression screening is indicated)   Over the past two weeks, have you felt down, depressed or hopeless? No  Over the past two weeks, have you felt little interest or pleasure in doing things? No  Have you lost interest or pleasure in daily life? No  Do you often feel hopeless? No  Do you cry easily over simple problems? No  Activities of Daily Living In your present state of health, do you have any difficulty performing the following activities?:  Driving? No Managing money?  No Feeding yourself? No Getting from bed to chair? NoNo exam performed today, not indicated. Climbing a flight of stairs? No Preparing food and eating?: No Bathing or showering? No Getting dressed: No Getting to the toilet? No Using the toilet:No Moving around from place to place: No In the past year have you fallen or had a near fall?:No   Are you sexually active?  Yes  Do you have more than one partner?  No partners  Hearing Difficulties: No Do you often ask people to speak up or repeat themselves? No Do you experience ringing or noises in your ears? No Do you have difficulty understanding soft or whispered voices? No   Do you feel that you have a problem with memory? No  Do you often misplace items? No  Do you feel safe at home?  Yes  Cognitive Testing 6CIT Screen 03/21/2018  What Year? 0 points  What month? 0 points  What time? 0 points  Count back from 20 0 points  Months in reverse 0 points  Repeat phrase 0 points  Total Score 0    Advanced Directives have been discussed with the patient? No  List the Names of Other Physician/Practitioners you currently use: Patient Care Team: [name], PA-C as PCP - General (Physician Assistant) Johnny Bridge, MD as  Consulting Physician (Cardiology) Ruthy Dick, MD as Consulting Physician (Ophthalmology) Judieth Keens, MD as Consulting Physician (Endocrinology)   Indicate any recent Medical Services you may have received from other than [facility] providers in the past year (date may be approximate).  Immunization History  Administered Date(s) Administered  . Influenza, High Dose Seasonal PF 05/28/2016  . Pneumococcal Conjugate-13 06/24/2014  . Pneumococcal Polysaccharide-23 01/10/2017  . Tdap 05/19/2015    Screening Tests Health Maintenance  Topic Date Due  . OPHTHALMOLOGY EXAM  03/08/2018  . INFLUENZA VACCINE  11/08/2018 (Originally 02/23/2018)  . HEMOGLOBIN A1C  09/08/2018  . FOOT EXAM  12/09/2018  . MAMMOGRAM  08/18/2019  . TETANUS/TDAP  05/18/2025  . COLONOSCOPY  10/07/2027  . DEXA SCAN  Completed  . Hepatitis C Screening  Completed  . PNA vac Low Risk Adult  Completed    All answers were reviewed with the patient and necessary referrals were made:  [name], PA-C   03/21/2018   History reviewed: allergies, current medications, past family history, past medical history, past social history, past surgical history and problem list  Review of Systems Pertinent items are noted in HPI.    Objective:       Body mass index is 30.29 kg/m. BP 138/82   Pulse 76   Wt 171 lb (77.6 kg)   BMI 30.29 kg/m  Gen:  alert, not ill-appearing, no distress, appropriate for age, obese female HEENT: head normocephalic without obvious abnormality, conjunctiva and cornea clear, trachea midline Pulm: Normal work of breathing, normal phonation, clear to auscultation bilaterally, no wheezes, rales or rhonchi CV: Normal rate, regular rhythm, s1 and s2 distinct, no murmurs, clicks or rubs  GU: vulva without rashes or lesions, normal introitus and urethral meatus, vaginal mucosa with mild atrophic changes, normal discharge, cervix non-friable without lesions Neuro: alert and  oriented x 3, no tremor MSK: extremities atraumatic, normal gait and station Skin: intact, no rashes on exposed skin, no jaundice, no cyanosis Psych: well-groomed, cooperative, good eye contact, euthymic mood, affect mood-congruent, speech is articulate, and thought processes clear and goal-directed    A chaperone was used for the GU portion of the exam, Izell Milton, CMA.      Assessment:     .Theona was seen today for medicare wellness.  Diagnoses and all orders for this visit:  Encounter for Medicare annual wellness exam  Proteinuria, unspecified type  Hematuria, unspecified type  Dysuria -     POCT Urinalysis Dipstick -     Urine Culture -     cefUROXime (CEFTIN) 250 MG tablet; Take 1 tablet (250 mg total) by mouth 2 (two) times daily with a meal for 7 days. -     SureSwab, Vaginosis/Vaginitis Plus  Routine screening for STI (sexually transmitted infection) -     SureSwab, Vaginosis/Vaginitis Plus  Class 1 obesity due to excess calories with serious comorbidity and body mass index (BMI) of 30.0 to 30.9 in adult  Post-menopausal atrophic vaginitis        Plan:     During the course of the visit the patient was educated and counseled about appropriate screening and preventive services including:    diabetes preventive care, heart healthy diet, regular aeroic exercise  Diet review for nutrition referral? Not Indicated - completed 12/21/17   Patient Instructions (the written plan) was given to the patient.  Dysuria - likely 2/2 atrophic vaginitis. May be some trauma to the tissues from penetrative intercourse not visible on exam today. Will cover her for uncomplicated cystitis. She also requested STI testing today. Nothing per vagina for 1 week.  Medicare Attestation I have personally reviewed: The patient's medical and social history Their use of alcohol, tobacco or illicit drugs Their current medications and supplements The patient's functional ability  including ADLs,fall risks, home safety risks, cognitive, and hearing and visual impairment Diet and physical activities Evidence for depression or mood disorders  The patient's weight, height, BMI, have been recorded in the chart.  I have made referrals, counseling, and provided education to the patient based on review of the above and I have provided the patient with a written personalized care plan  for preventive services.     [name], [place]   03/21/2018

## 2018-03-21 NOTE — Patient Instructions (Addendum)
  Sara Gardner , Thank you for taking time to come for your Medicare Wellness Visit. I appreciate your ongoing commitment to your health goals. Please review the following plan we discussed and let me know if I can assist you in the future.   These are the goals we discussed: Goals    . Cut out extra servings    . Exercise 3x per week (30 min per time)    . HEMOGLOBIN A1C < 7.0       This is a list of the screening recommended for you and due dates:  Health Maintenance  Topic Date Due  . Eye exam for diabetics  03/08/2018  . Flu Shot  11/08/2018*  . Hemoglobin A1C  09/08/2018  . Complete foot exam   12/09/2018  . Mammogram  08/18/2019  . Tetanus Vaccine  05/18/2025  . Colon Cancer Screening  10/07/2027  . DEXA scan (bone density measurement)  Completed  .  Hepatitis C: One time screening is recommended by Center for Disease Control  (CDC) for  adults born from [birth year] through 1966.   Completed  . Pneumonia vaccines  Completed  *Topic was postponed. The date shown is not the original due date.

## 2018-03-22 LAB — URINE CULTURE
MICRO NUMBER:: 91023475
RESULT: NO GROWTH
SPECIMEN QUALITY:: ADEQUATE

## 2018-03-25 LAB — SURESWAB, VAGINOSIS/VAGINITIS PLUS
Atopobium vaginae: NOT DETECTED
C. ALBICANS, DNA: NOT DETECTED
C. GLABRATA, DNA: NOT DETECTED
C. TRACHOMATIS RNA, TMA: NOT DETECTED
C. TROPICALIS, DNA: NOT DETECTED
C. parapsilosis, DNA: NOT DETECTED
GARDNERELLA VAGINALIS: NOT DETECTED
LACTOBACILLUS SPECIES: 7.6 Log (cells/mL)
MEGASPHAERA SPECIES: NOT DETECTED
N. GONORRHOEAE RNA, TMA: NOT DETECTED
Trichomonas vaginalis RNA: NOT DETECTED

## 2018-03-26 ENCOUNTER — Other Ambulatory Visit: Payer: Self-pay | Admitting: Physician Assistant

## 2018-03-26 DIAGNOSIS — E1169 Type 2 diabetes mellitus with other specified complication: Secondary | ICD-10-CM

## 2018-03-26 DIAGNOSIS — E785 Hyperlipidemia, unspecified: Principal | ICD-10-CM

## 2018-05-28 ENCOUNTER — Other Ambulatory Visit: Payer: Self-pay | Admitting: Physician Assistant

## 2018-05-28 DIAGNOSIS — K219 Gastro-esophageal reflux disease without esophagitis: Secondary | ICD-10-CM

## 2018-06-05 ENCOUNTER — Other Ambulatory Visit: Payer: Self-pay | Admitting: Physician Assistant

## 2018-06-05 DIAGNOSIS — E1169 Type 2 diabetes mellitus with other specified complication: Secondary | ICD-10-CM

## 2018-06-05 DIAGNOSIS — E785 Hyperlipidemia, unspecified: Principal | ICD-10-CM

## 2018-07-04 ENCOUNTER — Ambulatory Visit (INDEPENDENT_AMBULATORY_CARE_PROVIDER_SITE_OTHER): Payer: Medicare Other | Admitting: Physician Assistant

## 2018-07-04 ENCOUNTER — Encounter: Payer: Self-pay | Admitting: Physician Assistant

## 2018-07-04 VITALS — BP 123/82 | HR 89 | Temp 98.2°F | Resp 14 | Wt 172.0 lb

## 2018-07-04 DIAGNOSIS — E1169 Type 2 diabetes mellitus with other specified complication: Secondary | ICD-10-CM

## 2018-07-04 DIAGNOSIS — E785 Hyperlipidemia, unspecified: Secondary | ICD-10-CM

## 2018-07-04 DIAGNOSIS — M1712 Unilateral primary osteoarthritis, left knee: Secondary | ICD-10-CM

## 2018-07-04 DIAGNOSIS — Z0100 Encounter for examination of eyes and vision without abnormal findings: Secondary | ICD-10-CM

## 2018-07-04 DIAGNOSIS — M25562 Pain in left knee: Secondary | ICD-10-CM

## 2018-07-04 DIAGNOSIS — R51 Headache: Secondary | ICD-10-CM

## 2018-07-04 DIAGNOSIS — Z1231 Encounter for screening mammogram for malignant neoplasm of breast: Secondary | ICD-10-CM

## 2018-07-04 DIAGNOSIS — G8929 Other chronic pain: Secondary | ICD-10-CM

## 2018-07-04 DIAGNOSIS — E119 Type 2 diabetes mellitus without complications: Secondary | ICD-10-CM

## 2018-07-04 DIAGNOSIS — R0789 Other chest pain: Secondary | ICD-10-CM | POA: Diagnosis not present

## 2018-07-04 DIAGNOSIS — R519 Headache, unspecified: Secondary | ICD-10-CM | POA: Insufficient documentation

## 2018-07-04 MED ORDER — DICLOFENAC SODIUM 1 % TD GEL: 4.0000 g | Freq: Four times a day (QID) | TRANSDERMAL | 1 refills | Status: DC

## 2018-07-04 MED ORDER — NITROGLYCERIN 0.4 MG SL SUBL: 0.4000 mg | SUBLINGUAL_TABLET | SUBLINGUAL | 1 refills | Status: DC | PRN

## 2018-07-04 NOTE — Progress Notes (Signed)
HPI:                                                                Sara Gardner is a 67 y.o. female who presents to [facility] [place] today for facial pain   Reports "stabbing pains down the sides of the face." Pain is located in the temporal area. Onset 2 weeks ago, sudden Lasting for several seconds Rated as severe Frequency: approx every other day, 1-3 times per day Occurs mostly at rest, such as while watching tv. No provoking or alleviating factors. She states this has occurred in the past when she felt stressed, but she has not felt stressed lately No vision change, hearing change, headache, focal weakness, sensory change, LOC, falls  She also had 1 episode of chest pain while laying in bed last night. CP was left-sided, described as a stab. Lasted for a second, resolved on its own. She couldn't find her nitro so she took an extra baby aspirin.  She has not had any exertional chest pain. No orthopnea, PND, SOB, claudication. She last saw Cardiology 4 months ago on 02/23/18 and there were no concerns.  Requesting new referral to Ophthalmology. This will be her 3rd referral this year. She has had personal issues with the staff at the last two practices.  Requesting refills.  Past Medical History:  Diagnosis Date  . Aortic atherosclerosis ([place]) 06/10/2017  . CAD (coronary artery disease)   . Cancer ([place])   . Cardiomegaly 06/10/2017  . 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 Dermatochalasis of both eyelids 04/07/2017  . Diabetes mellitus without complication ([place])   . Diastolic heart failure, NYHA class 1 ([place])   . GERD (gastroesophageal reflux disease)   . Hypertension   . Lumbar degenerative disc disease 03/09/2017  . Myocardial infarction ([place]) 2005  . Papillary thyroid carcinoma ([place])   . Posterior vitreous detachment, right eye 04/07/2017  . Thyroid disease    Past Surgical History:  Procedure  Laterality Date  . TOTAL THYROIDECTOMY  2016   Social History   Tobacco Use  . Smoking status: Never Smoker  . Smokeless tobacco: Never Used  Substance Use Topics  . Alcohol use: No   family history includes Breast cancer in her daughter; Hypertension in her brother, mother, sister, and sister.    ROS: Review of Systems  Respiratory: Negative for cough, hemoptysis and shortness of breath.   Cardiovascular: Positive for chest pain and leg swelling. Negative for orthopnea, claudication and PND.  Gastrointestinal: Positive for heartburn.  Musculoskeletal: Negative for falls.  Neurological: Negative for dizziness, sensory change, speech change, focal weakness, loss of consciousness, weakness and headaches.  Psychiatric/Behavioral: The patient is nervous/anxious.   All other systems reviewed and are negative.    Medications: Current Outpatient Medications  Medication Sig Dispense Refill  . AMBULATORY NON FORMULARY MEDICATION Knee-high, medium compression, graduated compression stockings. Apply to lower extremities. [email or web address], Zippered Compression Stockings, medium circ, long length 1 each 0  . ammonium lactate (LAC-HYDRIN) 12 % lotion APP TOPICALLY PRF DRY SKIN  0  . aspirin 81 MG chewable tablet Chew by mouth.    . diclofenac sodium (VOLTAREN) 1 % GEL Apply 4 g topically 4 (four) times daily. To  affected joint. 100 g 1  . Dulaglutide (TRULICITY) 1.51 VO/1.6WV SOPN Inject 1.5 mg as directed once a week. 12 pen 1  . EPINEPHrine 0.3 mg/0.3 mL IJ SOAJ injection Inject 1 each as directed as needed. 1 Device 1  . esomeprazole (NEXIUM) 20 MG capsule TAKE 1 CAPSULE(20 MG) BY MOUTH DAILY 90 capsule 0  . Lancets (ONETOUCH ULTRASOFT) lancets Check fasting blood sugar daily and 2 hours after largest meal of the day. Dx DM. 100 each prn  . levothyroxine (SYNTHROID, LEVOTHROID) 100 MCG tablet Take 100 mcg by mouth daily.  1  . lisinopril-hydrochlorothiazide (PRINZIDE,ZESTORETIC)  20-12.5 MG tablet Take 1 tablet by mouth daily. 90 tablet 1  . metFORMIN (GLUCOPHAGE) 1000 MG tablet TAKE 1 TABLET BY MOUTH EVERY MORNING 90 tablet 0  . metoprolol succinate (TOPROL-XL) 50 MG 24 hr tablet TAKE 1 TABLET(50 MG) BY MOUTH DAILY 90 tablet 0  . nitroGLYCERIN (NITROSTAT) 0.4 MG SL tablet Place 1 tablet (0.4 mg total) under the tongue every 5 (five) minutes as needed for chest pain. Max dose 3 tablets in 15 minutes 20 tablet 1  . ONE TOUCH ULTRA TEST test strip USE TO CHECK BLOOD SUGAR EVERY DAY AND 2 HOURS AFTER LARGEST MEAL 100 each 1  . ONETOUCH DELICA LANCETS FINE MISC USE TO CHECK FASTING BLOOD SUGAR DAILY AND 2 HOURS AFTER LARGEST MEAL OF THE DAY. Dx E11.69 100 each 1  . rosuvastatin (CRESTOR) 20 MG tablet TAKE 1 TABLET(20 MG) BY MOUTH DAILY 90 tablet 0   No current facility-administered medications for this visit.    Allergies  Allergen Reactions  . Bee Venom Swelling  . Tuberculin Tests Other (See Comments)  . Tape Rash       Objective:  BP 123/82   Pulse 89   Temp 98.2 F (36.8 C)   Resp 14   Wt 172 lb (78 kg)   SpO2 97%   BMI 30.47 kg/m  Gen: well-groomed, not ill-appearing, no acute distress, obese female HEENT: head normocephalic, atraumatic; conjunctiva and cornea clear, oropharynx clear, moist mucus membranes; neck supple, no meningeal signs Pulm: Normal work of breathing, normal phonation, clear to auscultation bilaterally CV: Normal rate, regular rhythm, s1 and s2 distinct, no murmurs, clicks or rubs Neuro:  cranial nerves II-XII intact, no nystagmus, normal finger-to-nose, normal heel-to-shin, negative pronator drift, normal rapid alternating movements, normal tone, no tremor MSK: strength 5/5 and symmetric in bilateral upper and lower extremities, normal gait and station, negative Romberg Mental Status: alert and oriented x 3, speech articulate, and thought processes clear and goal-directed  ECG 07/04/2018 11:53 am Vent rate 65 bpm PR-I 158 ms QRS  78 ms QT/QTc 398/413 ms Normal sinus rhythm  Labs Care Everywhere  TSH 0.507 05/11/18  The 10-year ASCVD risk score Mikey Bussing DC Jr., et al., 2013) is: 19.8%   Values used to calculate the score:     Age: 41 years     Sex: Female     Is Non-Hispanic African American: Yes     Diabetic: Yes     Tobacco smoker: No     Systolic Blood Pressure: 371 mmHg     Is BP treated: Yes     HDL Cholesterol: 71 mg/dL     Total Cholesterol: 176 mg/dL  Assessment and Plan: 67 y.o. female with   .Candra was seen today for facial pain and chest pain.  Diagnoses and all orders for this visit:  Craniofacial pain -     Ambulatory referral to Neurology -  CBC -     COMPLETE METABOLIC PANEL WITH GFR  Atypical chest pain  Diabetic eye exam ([place]) -     Ambulatory referral to Ophthalmology  Controlled type 2 diabetes mellitus without complication, without long-term current use of insulin ([facility]) -     CBC -     COMPLETE METABOLIC PANEL WITH GFR -     Hemoglobin A1c -     Lipid Panel w/reflex Direct LDL  Dyslipidemia associated with type 2 diabetes mellitus ([facility]) -     Lipid Panel w/reflex Direct LDL  Anterior chest wall pain -     CBC -     COMPLETE METABOLIC PANEL WITH GFR -     nitroGLYCERIN (NITROSTAT) 0.4 MG SL tablet; Place 1 tablet (0.4 mg total) under the tongue every 5 (five) minutes as needed for chest pain. Max dose 3 tablets in 15 minutes  Chronic pain of left knee -     diclofenac sodium (VOLTAREN) 1 % GEL; Apply 4 g topically 4 (four) times daily. To affected joint.  Primary osteoarthritis of left knee -     diclofenac sodium (VOLTAREN) 1 % GEL; Apply 4 g topically 4 (four) times daily. To affected joint.  Breast cancer screening by mammogram -     MM 3D SCREEN BREAST BILATERAL; Future   Craniofacial pain - reassuring neuro exam - history is not overt for trigeminal neuralgia - referring to neuro for second opinion  Chest pain - non-exertional, medically managed  CAD - ECG personally reviewed by me, NSR, no ST depressions or elevations - refilled Nitro - cont baby asa and Crestor for secondary prevention - keep routine f/u w/cardiology - ED precautions discussed  Refills provided as above Personally reviewed HM. Orders placed as above  Patient education and anticipatory guidance given Patient agrees with treatment plan Follow-up in 3 months for diabetes or sooner as needed if symptoms worsen or fail to improve  Darlyne Russian PA-C

## 2018-07-05 ENCOUNTER — Other Ambulatory Visit: Payer: Self-pay | Admitting: Physician Assistant

## 2018-07-05 DIAGNOSIS — I10 Essential (primary) hypertension: Secondary | ICD-10-CM

## 2018-07-06 LAB — COMPLETE METABOLIC PANEL WITH GFR
AG Ratio: 1.2 (calc) (ref 1.0–2.5)
ALKALINE PHOSPHATASE (APISO): 74 U/L (ref 33–130)
ALT: 20 U/L (ref 6–29)
AST: 17 U/L (ref 10–35)
Albumin: 3.7 g/dL (ref 3.6–5.1)
BUN: 17 mg/dL (ref 7–25)
CO2: 34 mmol/L — AB (ref 20–32)
CREATININE: 0.89 mg/dL (ref 0.50–0.99)
Calcium: 9.6 mg/dL (ref 8.6–10.4)
Chloride: 102 mmol/L (ref 98–110)
GFR, EST AFRICAN AMERICAN: 77 mL/min/{1.73_m2} (ref >=60)
GFR, Est Non African American: 67 mL/min/{1.73_m2} (ref >=60)
GLOBULIN: 3 g/dL (ref 1.9–3.7)
GLUCOSE: 91 mg/dL (ref 65–99)
Potassium: 4.2 mmol/L (ref 3.5–5.3)
Sodium: 143 mmol/L (ref 135–146)
TOTAL PROTEIN: 6.7 g/dL (ref 6.1–8.1)
Total Bilirubin: 0.3 mg/dL (ref 0.2–1.2)

## 2018-07-06 LAB — HEMOGLOBIN A1C
HEMOGLOBIN A1C: 6.4 %{Hb} — AB (ref <5.7)
Mean Plasma Glucose: 137 (calc)
eAG (mmol/L): 7.6 (calc)

## 2018-07-06 LAB — CBC
HEMATOCRIT: 37.4 % (ref 35.0–45.0)
HEMOGLOBIN: 12.2 g/dL (ref 11.7–15.5)
MCH: 26.5 pg — ABNORMAL LOW (ref 27.0–33.0)
MCHC: 32.6 g/dL (ref 32.0–36.0)
MCV: 81.1 fL (ref 80.0–100.0)
MPV: 11 fL (ref 7.5–12.5)
Platelets: 264 10*3/uL (ref 140–400)
RBC: 4.61 10*6/uL (ref 3.80–5.10)
RDW: 15.4 % — ABNORMAL HIGH (ref 11.0–15.0)
WBC: 6.2 10*3/uL (ref 3.8–10.8)

## 2018-07-06 LAB — LIPID PANEL W/REFLEX DIRECT LDL
CHOL/HDL RATIO: 2.5 (calc) (ref <5.0)
Cholesterol: 176 mg/dL (ref <200)
HDL: 71 mg/dL (ref >50)
LDL CHOLESTEROL (CALC): 90 mg/dL
Non-HDL Cholesterol (Calc): 105 mg/dL (calc) (ref <130)
TRIGLYCERIDES: 68 mg/dL (ref <150)

## 2018-07-07 ENCOUNTER — Encounter: Payer: Self-pay | Admitting: Physician Assistant

## 2018-07-07 NOTE — Addendum Note (Signed)
Addended by: Alena Bills R on: 07/07/2018 01:39 PM   Modules accepted: Orders

## 2018-07-09 MED ORDER — ROSUVASTATIN CALCIUM 40 MG PO TABS: 40.0000 mg | ORAL_TABLET | Freq: Every day | ORAL | 1 refills | Status: DC

## 2018-07-09 NOTE — Addendum Note (Signed)
Addended by: Nelson Chimes E on: 07/09/2018 02:04 PM   Modules accepted: Orders

## 2018-07-13 ENCOUNTER — Telehealth: Payer: Self-pay | Admitting: Physician Assistant

## 2018-07-13 DIAGNOSIS — R51 Headache: Principal | ICD-10-CM

## 2018-07-13 DIAGNOSIS — R519 Headache, unspecified: Secondary | ICD-10-CM

## 2018-07-13 DIAGNOSIS — G4489 Other headache syndrome: Secondary | ICD-10-CM

## 2018-07-13 MED ORDER — TOPIRAMATE 25 MG PO TABS: 25.0000 mg | ORAL_TABLET | Freq: Two times a day (BID) | ORAL | 1 refills | Status: DC

## 2018-07-13 NOTE — Telephone Encounter (Signed)
Spoke with Pt. She reports the pain is still present, but it is now a 9/10 on the pain scale. She would like the referral to be updated to someone in the [facility] area.

## 2018-07-13 NOTE — Telephone Encounter (Signed)
I have ordered an MR Brain. This is not stat, but can we get this scheduled asap? I don't think she will be able to go to [place] locations I am also starting her on Topamax 25 mg twice a day If she has any new symptoms, I want her to be evaluated in the office this week Otherwise, can await MRI result and follow-up with Neuro Referral placed to [facility]

## 2018-07-13 NOTE — Telephone Encounter (Signed)
Can you call Sara Gardner and find out if she is still having the facial pain she was seen for on 12/10 and if she is having any new symptoms? If so, we will need to place a new referral to neuro because Dr. Lynnette Caffey in [place] does not treat this type of condition. She will need to go to [facility] or [facility], so find out her preference

## 2018-07-14 NOTE — Telephone Encounter (Signed)
Attempted to contact Pt. No answer and no VM. Would like to get her set up for tomorrow at [facility] if she can make it. Will attempt to callback.

## 2018-07-14 NOTE — Telephone Encounter (Signed)
Pt advised. She prefers to be scheduled at [facility] on Monday. Imaging notified.

## 2018-07-17 ENCOUNTER — Ambulatory Visit (INDEPENDENT_AMBULATORY_CARE_PROVIDER_SITE_OTHER): Payer: Medicare Other

## 2018-07-17 DIAGNOSIS — R519 Headache, unspecified: Secondary | ICD-10-CM

## 2018-07-17 DIAGNOSIS — R51 Headache: Secondary | ICD-10-CM

## 2018-07-17 DIAGNOSIS — G4489 Other headache syndrome: Secondary | ICD-10-CM | POA: Diagnosis not present

## 2018-07-20 ENCOUNTER — Telehealth: Payer: Self-pay

## 2018-07-20 ENCOUNTER — Encounter: Payer: Self-pay | Admitting: Physician Assistant

## 2018-07-20 DIAGNOSIS — I6381 Other cerebral infarction due to occlusion or stenosis of small artery: Secondary | ICD-10-CM | POA: Insufficient documentation

## 2018-07-20 HISTORY — DX: Other cerebral infarction due to occlusion or stenosis of small artery: I63.81

## 2018-07-20 NOTE — Progress Notes (Signed)
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 This is not the cause of symptoms. Cont baby aspirin 81 mg daily Continue Topamax and follow-up with Neurology

## 2018-07-20 NOTE — Telephone Encounter (Signed)
Patient called and left a message asking for a return call. I called and left a message for patient to call back.

## 2018-08-09 ENCOUNTER — Emergency Department (INDEPENDENT_AMBULATORY_CARE_PROVIDER_SITE_OTHER)
Admission: EM | Admit: 2018-08-09 | Discharge: 2018-08-09 | Disposition: A | Discharge status: Home / Self Care | Payer: Medicare Other | Source: Home / Self Care | Attending: Family Medicine | Admitting: Family Medicine

## 2018-08-09 ENCOUNTER — Emergency Department (INDEPENDENT_AMBULATORY_CARE_PROVIDER_SITE_OTHER): Payer: Medicare Other

## 2018-08-09 ENCOUNTER — Other Ambulatory Visit: Payer: Self-pay

## 2018-08-09 ENCOUNTER — Encounter: Payer: Self-pay | Admitting: *Deleted

## 2018-08-09 DIAGNOSIS — M175 Other unilateral secondary osteoarthritis of knee: Secondary | ICD-10-CM

## 2018-08-09 DIAGNOSIS — M25572 Pain in left ankle and joints of left foot: Secondary | ICD-10-CM

## 2018-08-09 DIAGNOSIS — S8992XA Unspecified injury of left lower leg, initial encounter: Secondary | ICD-10-CM | POA: Diagnosis not present

## 2018-08-09 DIAGNOSIS — W19XXXA Unspecified fall, initial encounter: Secondary | ICD-10-CM | POA: Diagnosis not present

## 2018-08-09 DIAGNOSIS — S80212A Abrasion, left knee, initial encounter: Secondary | ICD-10-CM

## 2018-08-09 DIAGNOSIS — M1712 Unilateral primary osteoarthritis, left knee: Secondary | ICD-10-CM | POA: Diagnosis not present

## 2018-08-09 DIAGNOSIS — W010XXA Fall on same level from slipping, tripping and stumbling without subsequent striking against object, initial encounter: Secondary | ICD-10-CM

## 2018-08-09 DIAGNOSIS — M25462 Effusion, left knee: Secondary | ICD-10-CM

## 2018-08-09 NOTE — Discharge Instructions (Signed)
°  You may wear the knee brace for comfort. Try to elevate your Left leg above your heart and apply a cool compress 2-3 times daily to help with pain and swelling.  Please call to schedule a follow up appointment with Sports Medicine next week if not improving.

## 2018-08-09 NOTE — ED Triage Notes (Signed)
Pt c/o LT knee and ankle pain post fall x 2 days ago. No OTC meds. She has applied a brace and heat.

## 2018-08-09 NOTE — ED Provider Notes (Signed)
[name]    CSN: [id number] Arrival date & time: 08/09/18  1417     History   Chief Complaint Chief Complaint  Patient presents with  . Knee Injury  . Ankle Pain    HPI Sara Gardner is a 68 y.o. female.   HPI Sara Gardner is a 68 y.o. female presenting to UC with c/o Left knee and ankle pain with swelling that started 2 days ago after trip and fall at home. She has been elevating her leg and applying ice but no OTC medications. Pain is gradually improving but still severe in her knee with swelling. Unable to bear full weight on her Left leg. She is using a walker to ambulate. No prior fracture or surgery to same leg.    Past Medical History:  Diagnosis Date  . Aortic atherosclerosis ([place]) 06/10/2017  . CAD (coronary artery disease)   . Cancer ([place])   . Cardiomegaly 06/10/2017  . 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 Dermatochalasis of both eyelids 04/07/2017  . Diabetes mellitus without complication ([place])   . Diastolic heart failure, NYHA class 1 ([place])   . GERD (gastroesophageal reflux disease)   . Hypertension   . 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 Lumbar degenerative disc disease 03/09/2017  . Myocardial infarction ([place]) 2005  . Papillary thyroid carcinoma ([place])   . Posterior vitreous detachment, right eye 04/07/2017  . Thyroid disease     Patient Active Problem List   Diagnosis Date Noted  . Lacunar infarction ([place]) 07/20/2018  . Craniofacial pain 07/04/2018  . Proteinuria 03/21/2018  . Hematuria 03/21/2018  . Controlled type 2 diabetes mellitus without complication, without long-term current use of insulin ([place]) 03/08/2018  . Encounter for weight management 01/06/2018  . Urge incontinence 08/23/2017  . 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/23/2017  . Urinary frequency 08/17/2017  . Overactive bladder 08/17/2017  . Equinus contracture of ankle 07/27/2017  . Hammer toes of both feet 07/27/2017   . Dermatophytosis, nail 07/27/2017  . Calcaneal spur of both feet 07/27/2017  . Dysuria 07/07/2017  . Atypical chest pain 06/10/2017  . Aortic atherosclerosis ([place]) 06/10/2017  . Cardiomegaly 06/10/2017  . Vitreous detachment of right eye 05/12/2017  . 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 Dermatochalasis of both eyelids 04/07/2017  . Posterior vitreous detachment, right eye 04/07/2017  . Acute bilateral low back pain without sciatica 03/09/2017  . 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 Lumbar degenerative disc disease 03/09/2017  . Acute right flank pain 03/07/2017  . Primary osteoarthritis of left knee 03/04/2017  . Chronic pain of left knee 03/04/2017  . Bilateral primary osteoarthritis of knee 12/15/2016  . Bilateral lower extremity edema 12/15/2016  . Allergy to honey bee venom 12/15/2016  . Cyst of right kidney 07/09/2016  . Essential hypertension 02/16/2016  . DM type 2 with diabetic dyslipidemia ([place]) 02/10/2016  . Gastroesophageal reflux disease without esophagitis 02/10/2016  . Breast pain, left 12/30/2015  . 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 Diabetic sensorimotor polyneuropathy ([place]) 10/27/2015  . Papillary thyroid carcinoma ([place]) 08/09/2014  . Post-menopausal atrophic vaginitis 06/24/2014  . Left thyroid nodule 01/29/2014  . Nodule of left lung 01/21/2014  . Class 1 obesity due to excess calories with serious comorbidity and body mass index (BMI) of 30.0 to 30.9 in adult 06/12/2012  . Palpitations 11/29/2011  . Abnormal mammogram of right breast 08/30/2011  . Arthritis of left knee 05/24/2011  . Coronary  artery disease involving native coronary artery of native heart with angina pectoris ([place]) 05/24/2011  . History of acute myocardial infarction of inferior wall 05/24/2011    Past Surgical History:  Procedure Laterality Date  . TOTAL THYROIDECTOMY  2016    OB History   No obstetric history on file.       Home Medications    Prior to Admission medications   Medication Sig Start Date End Date Taking? Authorizing Provider  AMBULATORY NON FORMULARY MEDICATION Knee-high, medium compression, graduated compression stockings. Apply to lower extremities. [email or web address], Zippered Compression Stockings, medium circ, long length 12/15/16   [name], PA-C  ammonium lactate (LAC-HYDRIN) 12 % lotion APP TOPICALLY PRF DRY SKIN 07/08/16   [provider]  aspirin 81 MG chewable tablet Chew by mouth.    [provider]  diclofenac sodium (VOLTAREN) 1 % GEL Apply 4 g topically 4 (four) times daily. To affected joint. 07/04/18   [name], PA-C  Dulaglutide (TRULICITY) 1.66 AY/3.0ZS SOPN Inject 1.5 mg as directed once a week. 03/08/18   [name], PA-C  EPINEPHrine 0.3 mg/0.3 mL IJ SOAJ injection Inject 1 each as directed as needed. 12/15/16   [name], PA-C  esomeprazole (NEXIUM) 20 MG capsule TAKE 1 CAPSULE(20 MG) BY MOUTH DAILY 05/29/18   [name], PA-C  Lancets [facility] ULTRASOFT) lancets Check fasting blood sugar daily and 2 hours after largest meal of the day. Dx DM. 10/04/16   [name], PA-C  levothyroxine (SYNTHROID, LEVOTHROID) 100 MCG tablet Take 100 mcg by mouth daily. 07/12/16   [provider]  lisinopril-hydrochlorothiazide (PRINZIDE,ZESTORETIC) 20-12.5 MG tablet Take 1 tablet by mouth daily. 03/02/18   [name], PA-C  metFORMIN (GLUCOPHAGE) 1000 MG tablet TAKE 1 TABLET BY MOUTH EVERY MORNING 05/29/18   [name], PA-C  metoprolol succinate (TOPROL-XL) 50 MG 24 hr tablet TAKE 1 TABLET(50 MG) BY MOUTH DAILY 05/29/18   [name], PA-C  nitroGLYCERIN (NITROSTAT) 0.4 MG SL tablet Place 1 tablet (0.4 mg total) under the tongue every 5 (five) minutes as needed for chest pain. Max dose 3 tablets in 15 minutes  07/04/18   [name], PA-C  ONE TOUCH ULTRA TEST test strip USE TO CHECK BLOOD SUGAR EVERY DAY AND 2 HOURS AFTER LARGEST MEAL 06/05/18   Maisie Fus, Elson Areas, PA-C  ONETOUCH DELICA LANCETS FINE MISC USE TO CHECK FASTING BLOOD SUGAR DAILY AND 2 HOURS AFTER LARGEST MEAL OF THE DAY. Dx E11.69 03/29/18   [name], PA-C  rosuvastatin (CRESTOR) 40 MG tablet Take 1 tablet (40 mg total) by mouth at bedtime. 07/09/18   [name], PA-C  topiramate (TOPAMAX) 25 MG tablet Take 1 tablet (25 mg total) by mouth 2 (two) times daily. 07/13/18   [name], PA-C    Family History Family History  Problem Relation Age of Onset  . Hypertension Mother   . Hypertension Sister   . Hypertension Brother   . Breast cancer Daughter   . Hypertension Sister     Social History Social History   Tobacco Use  . Smoking status: Never Smoker  . Smokeless tobacco: Never Used  Substance Use Topics  . Alcohol use: No  . Drug use: No     Allergies   Bee venom; Tuberculin tests; and Tape   Review of Systems Review of Systems  Musculoskeletal: Positive for arthralgias, gait problem, joint swelling and myalgias.  Skin: Positive for wound. Negative for  color change.     Physical Exam Triage Vital Signs ED Triage Vitals  Enc Vitals Group     BP 08/09/18 1444 107/71     Pulse Rate 08/09/18 1444 93     Resp 08/09/18 1444 18     Temp 08/09/18 1444 98.1 F (36.7 C)     Temp Source 08/09/18 1444 Oral     SpO2 08/09/18 1444 95 %     Weight 08/09/18 1446 176 lb (79.8 kg)     Height 08/09/18 1446 5' 3.5" (1.613 m)     Head Circumference --      Peak Flow --      Pain Score 08/09/18 1445 7     Pain Loc --      Pain Edu? --      Excl. in [place]? --    No data found.  Updated Vital Signs BP 107/71 (BP Location: Right Arm)   Pulse 93   Temp 98.1 F (36.7 C) (Oral)   Resp 18   Ht 5' 3.5" (1.613 m)   Wt 176 lb (79.8 kg)   SpO2 95%    BMI 30.69 kg/m   Visual Acuity Right Eye Distance:   Left Eye Distance:   Bilateral Distance:    Right Eye Near:   Left Eye Near:    Bilateral Near:     Physical Exam Vitals signs and nursing note reviewed.  Constitutional:      Appearance: She is well-developed.  HENT:     Head: Normocephalic and atraumatic.  Neck:     Musculoskeletal: Normal range of motion.  Cardiovascular:     Rate and Rhythm: Normal rate.  Pulmonary:     Effort: Pulmonary effort is normal.  Musculoskeletal:        General: Swelling and tenderness present.     Comments: Left knee: moderate edema, tenderness to anterior aspect. Slight decreased flexion. Full extension. Calf is soft,non-tender. Left ankle: no edema, mild tenderness to lateral aspect. Full ROM.   Skin:    General: Skin is warm and dry.     Comments: Left anterior knee: 2cm superficial abrasion. No bleeding. No ecchymosis.  Neurological:     Mental Status: She is alert and oriented to person, place, and time.  Psychiatric:        Behavior: Behavior normal.      UC Treatments / Results  Labs (all labs ordered are listed, but only abnormal results are displayed) Labs Reviewed - No data to display  EKG None  Radiology Dg Ankle Complete Left  Result Date: 08/09/2018 CLINICAL DATA:  Patient fell 2 days ago.  Left knee and ankle pain. EXAM: LEFT ANKLE COMPLETE - 3+ VIEW COMPARISON:  None. FINDINGS: The mineralization and alignment are normal. There is no evidence of acute fracture or dislocation. The joint spaces are maintained. Possible mild anteromedial soft tissue swelling without foreign body. Small calcaneal spurs. IMPRESSION: No evidence of acute osseous injury. Possible anteromedial soft tissue swelling. Electronically Signed   By: Richardean Sale M.D.   On: 08/09/2018 15:26   Dg Knee Complete 4 Views Left  Result Date: 08/09/2018 CLINICAL DATA:  [place] 2 days ago.  Left knee and ankle pain. EXAM: LEFT KNEE - COMPLETE 4+ VIEW  COMPARISON:  Radiographs 03/04/2017. FINDINGS: Tricompartmental osteoarthritis, most advanced in the lateral compartment where there is bone-on-bone apposition. Mild resulting genu valgus deformity. Moderate size knee joint effusion. No evidence of acute fracture or dislocation. IMPRESSION: No acute osseous findings identified. Progressive  tricompartmental osteoarthritis, greatest in the lateral compartment. Moderate size knee joint effusion. Electronically Signed   By: Richardean Sale M.D.   On: 08/09/2018 15:25    Procedures Procedures (including critical care time)  Medications Ordered in UC Medications - No data to display  Initial Impression / Assessment and Plan / UC Course  I have reviewed the triage vital signs and the nursing notes.  Pertinent labs & imaging results that were available during my care of the patient were reviewed by me and considered in my medical decision making (see chart for details).     Reviewed imaging with pt. Knee splint applied for comfort Home care info provided. Encouraged f/u with sports medicine for further evaluation and treatment.  Final Clinical Impressions(s) / UC Diagnoses   Final diagnoses:  Left knee injury, initial encounter  Effusion of knee joint, left  Abrasion, left knee, initial encounter  Fall from slip, trip, or stumble, initial encounter  Other secondary osteoarthritis of left knee  Acute left ankle pain     Discharge Instructions      You may wear the knee brace for comfort. Try to elevate your Left leg above your heart and apply a cool compress 2-3 times daily to help with pain and swelling.  Please call to schedule a follow up appointment with Sports Medicine next week if not improving.    ED Prescriptions    None     Controlled Substance Prescriptions [place] Controlled Substance Registry consulted? Not Applicable   Tyrell Antonio 08/09/18 1715

## 2018-08-20 ENCOUNTER — Other Ambulatory Visit: Payer: Self-pay | Admitting: Physician Assistant

## 2018-08-20 DIAGNOSIS — E1169 Type 2 diabetes mellitus with other specified complication: Secondary | ICD-10-CM

## 2018-08-20 DIAGNOSIS — E785 Hyperlipidemia, unspecified: Principal | ICD-10-CM

## 2018-08-21 ENCOUNTER — Ambulatory Visit: Payer: Medicare Other

## 2018-08-21 ENCOUNTER — Ambulatory Visit
Admission: RE | Admit: 2018-08-21 | Discharge: 2018-08-21 | Disposition: A | Discharge status: Home / Self Care | Payer: Medicare Other | Source: Ambulatory Visit | Attending: Physician Assistant | Admitting: Physician Assistant

## 2018-08-21 DIAGNOSIS — Z1231 Encounter for screening mammogram for malignant neoplasm of breast: Secondary | ICD-10-CM

## 2018-08-29 ENCOUNTER — Encounter: Payer: Self-pay | Admitting: Physician Assistant

## 2018-08-29 ENCOUNTER — Other Ambulatory Visit: Payer: Self-pay | Admitting: Physician Assistant

## 2018-08-29 ENCOUNTER — Ambulatory Visit (INDEPENDENT_AMBULATORY_CARE_PROVIDER_SITE_OTHER): Payer: Medicare Other | Admitting: Physician Assistant

## 2018-08-29 VITALS — BP 101/68 | HR 75 | Resp 14 | Wt 169.0 lb

## 2018-08-29 DIAGNOSIS — K219 Gastro-esophageal reflux disease without esophagitis: Secondary | ICD-10-CM

## 2018-08-29 DIAGNOSIS — J069 Acute upper respiratory infection, unspecified: Secondary | ICD-10-CM | POA: Diagnosis not present

## 2018-08-29 DIAGNOSIS — I10 Essential (primary) hypertension: Secondary | ICD-10-CM

## 2018-08-29 MED ORDER — IPRATROPIUM BROMIDE 0.06 % NA SOLN: 2.0000 | Freq: Four times a day (QID) | NASAL | 0 refills | Status: DC | PRN

## 2018-08-29 NOTE — Progress Notes (Signed)
HPI:                                                                Sara Gardner is a 68 y.o. female who presents to [facility]: [place] today for facial swelling  Presents with several days of puffiness in his bilateral cheeks. She was concerned this could be related to stroke. She feels like she may be coming down with a cold, endorses mild stuffy/runny nose. No fever, cough, or dyspnea. She denies facial droop, slurred speech, focal weakness, headache.     Past Medical History:  Diagnosis Date  . Aortic atherosclerosis ([place]) 06/10/2017  . CAD (coronary artery disease)   . Cancer ([place])   . Cardiomegaly 06/10/2017  . 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 Dermatochalasis of both eyelids 04/07/2017  . Diabetes mellitus without complication ([place])   . Diastolic heart failure, NYHA class 1 ([place])   . GERD (gastroesophageal reflux disease)   . Hypertension   . Lacunar infarction ([place]) 07/20/2018  . Lumbar degenerative disc disease 03/09/2017  . Myocardial infarction ([place]) 2005  . Papillary thyroid carcinoma ([place])   . Posterior vitreous detachment, right eye 04/07/2017  . Thyroid disease    Past Surgical History:  Procedure Laterality Date  . TOTAL THYROIDECTOMY  2016   Social History   Tobacco Use  . Smoking status: Never Smoker  . Smokeless tobacco: Never Used  Substance Use Topics  . Alcohol use: No   family history includes Breast cancer in her daughter; Hypertension in her brother, mother, sister, and sister.    ROS: negative except as noted in the HPI  Medications: Current Outpatient Medications  Medication Sig Dispense Refill  . AMBULATORY NON FORMULARY MEDICATION Knee-high, medium compression, graduated compression stockings. Apply to lower extremities. [email or web address], Zippered Compression Stockings, medium circ, long length 1 each 0  . ammonium lactate (LAC-HYDRIN) 12 % lotion APP  TOPICALLY PRF DRY SKIN  0  . aspirin 81 MG chewable tablet Chew by mouth.    . diclofenac sodium (VOLTAREN) 1 % GEL Apply 4 g topically 4 (four) times daily. To affected joint. 100 g 1  . EPINEPHrine 0.3 mg/0.3 mL IJ SOAJ injection Inject 1 each as directed as needed. 1 Device 1  . esomeprazole (NEXIUM) 20 MG capsule TAKE 1 CAPSULE(20 MG) BY MOUTH DAILY 90 capsule 0  . Lancets (ONETOUCH ULTRASOFT) lancets Check fasting blood sugar daily and 2 hours after largest meal of the day. Dx DM. 100 each prn  . levothyroxine (SYNTHROID, LEVOTHROID) 100 MCG tablet Take 100 mcg by mouth daily.  1  . lisinopril-hydrochlorothiazide (PRINZIDE,ZESTORETIC) 20-12.5 MG tablet Take 1 tablet by mouth daily. 90 tablet 1  . metFORMIN (GLUCOPHAGE) 1000 MG tablet TAKE 1 TABLET BY MOUTH EVERY MORNING 90 tablet 0  . metoprolol succinate (TOPROL-XL) 50 MG 24 hr tablet TAKE 1 TABLET(50 MG) BY MOUTH DAILY 90 tablet 0  . nitroGLYCERIN (NITROSTAT) 0.4 MG SL tablet Place 1 tablet (0.4 mg total) under the tongue every 5 (five) minutes as needed for chest pain. Max dose 3 tablets in 15 minutes 20 tablet 1  . ONE TOUCH ULTRA TEST test strip USE TO CHECK BLOOD SUGAR EVERY DAY AND 2 HOURS AFTER LARGEST MEAL  100 each 1  . ONETOUCH DELICA LANCETS FINE MISC USE TO CHECK FASTING BLOOD SUGAR DAILY AND 2 HOURS AFTER LARGEST MEAL OF THE DAY. Dx E11.69 100 each 1  . rosuvastatin (CRESTOR) 40 MG tablet Take 1 tablet (40 mg total) by mouth at bedtime. 90 tablet 1  . topiramate (TOPAMAX) 25 MG tablet Take 1 tablet (25 mg total) by mouth 2 (two) times daily. 60 tablet 1  . TRULICITY 1.5 KM/6.2MM SOPN INJECT 1.5 MG( ONE PEN) UNDER THE SKIN ONCE A WEEK AS DIRECTED 6 mL 1   No current facility-administered medications for this visit.    Allergies  Allergen Reactions  . Bee Venom Swelling  . Tuberculin Tests Other (See Comments)  . Tape Rash       Objective:  BP 101/68   Pulse 75   Wt 169 lb (76.7 kg)   BMI 29.47 kg/m  Gen:  alert,  not ill-appearing, no distress, appropriate for age 37: head normocephalic without obvious abnormality, conjunctiva and cornea clear, mild infraorbital edema, nasal mucosa edematous with rhinorrhea, no sinus tenderness, oropharynx clear, uvula midline, no exudates ,neck supple, no cervical adenopathy, trachea midline Pulm: Normal work of breathing, normal phonation Neuro: alert and oriented x 3, no tremor MSK: extremities atraumatic, normal gait and station, no peripheral edema Skin: intact, no rashes on exposed skin, no jaundice, no cyanosis Psych: well-groomed, cooperative, good eye contact, euthymic mood, affect mood-congruent, speech is articulate, and thought processes clear and goal-directed    No results found for this or any previous visit (from the past 72 hour(s)). No results found.    Assessment and Plan: 68 y.o. female with   .Jiah was seen today for facial swelling.  Diagnoses and all orders for this visit:  Acute upper respiratory infection -     ipratropium (ATROVENT) 0.06 % nasal spray; Place 2 sprays into both nostrils 4 (four) times daily as needed for rhinitis.   Supportive care for viral URI  Reassurance that facial edema is unrelated to her prior stroke and not a symptom of new stroke  Patient education and anticipatory guidance given Patient agrees with treatment plan Follow-up as needed if symptoms worsen or fail to improve  Darlyne Russian PA-C

## 2018-08-29 NOTE — Patient Instructions (Addendum)
Coricidin HBP for cold symptoms  For nasal symptoms/sinusitis: - nasal saline rinses / netti pot several times per day (do this prior to nasal spray) - prescription Atrovent nasal spray: 2 sprays each nostril, up to 4 times per day as needed - warm facial compresses   Upper Respiratory Infection, Adult An upper respiratory infection (URI) is a common viral infection of the nose, throat, and upper air passages that lead to the lungs. The most common type of URI is the common cold. URIs usually get better on their own, without medical treatment. What are the causes? A URI is caused by a virus. You may catch a virus by:  Breathing in droplets from an infected person's cough or sneeze.  Touching something that has been exposed to the virus (contaminated) and then touching your mouth, nose, or eyes. What increases the risk? You are more likely to get a URI if:  You are very young or very old.  It is autumn or winter.  You have close contact with others, such as at a daycare, school, or health care facility.  You smoke.  You have long-term (chronic) heart or lung disease.  You have a weakened disease-fighting (immune) system.  You have nasal allergies or asthma.  You are experiencing a lot of stress.  You work in an area that has poor air circulation.  You have poor nutrition. What are the signs or symptoms? A URI usually involves some of the following symptoms:  Runny or stuffy (congested) nose.  Sneezing.  Cough.  Sore throat.  Headache.  Fatigue.  Fever.  Loss of appetite.  Pain in your forehead, behind your eyes, and over your cheekbones (sinus pain).  Muscle aches.  Redness or irritation of the eyes.  Pressure in the ears or face. How is this diagnosed? This condition may be diagnosed based on your medical history and symptoms, and a physical exam. Your health care provider may use a cotton swab to take a mucus sample from your nose (nasal swab). This  sample can be tested to determine what virus is causing the illness. How is this treated? URIs usually get better on their own within 7-10 days. You can take steps at home to relieve your symptoms. Medicines cannot cure URIs, but your health care provider may recommend certain medicines to help relieve symptoms, such as:  Over-the-counter cold medicines.  Cough suppressants. Coughing is a type of defense against infection that helps to clear the respiratory system, so take these medicines only as recommended by your health care provider.  Fever-reducing medicines. Follow these instructions at home: Activity  Rest as needed.  If you have a fever, stay home from work or school until your fever is gone or until your health care provider says you are no longer contagious. Your health care provider may have you wear a face mask to prevent your infection from spreading. Relieving symptoms  Gargle with a salt-water mixture 3-4 times a day or as needed. To make a salt-water mixture, completely dissolve -1 tsp of salt in 1 cup of warm water.  Use a cool-mist humidifier to add moisture to the air. This can help you breathe more easily. Eating and drinking   Drink enough fluid to keep your urine pale yellow.  Eat soups and other clear broths. General instructions   Take over-the-counter and prescription medicines only as told by your health care provider. These include cold medicines, fever reducers, and cough suppressants.  Do not use any products that  contain nicotine or tobacco, such as cigarettes and e-cigarettes. If you need help quitting, ask your health care provider.  Stay away from secondhand smoke.  Stay up to date on all immunizations, including the yearly (annual) flu vaccine.  Keep all follow-up visits as told by your health care provider. This is important. How to prevent the spread of infection to others   URIs can be passed from person to person (are contagious). To  prevent the infection from spreading: ? Wash your hands often with soap and water. If soap and water are not available, use hand sanitizer. ? Avoid touching your mouth, face, eyes, or nose. ? Cough or sneeze into a tissue or your sleeve or elbow instead of into your hand or into the air. Contact a health care provider if:  You are getting worse instead of better.  You have a fever or chills.  Your mucus is brown or red.  You have yellow or brown discharge coming from your nose.  You have pain in your face, especially when you bend forward.  You have swollen neck glands.  You have pain while swallowing.  You have white areas in the back of your throat. Get help right away if:  You have shortness of breath that gets worse.  You have severe or persistent: ? Headache. ? Ear pain. ? Sinus pain. ? Chest pain.  You have chronic lung disease along with any of the following: ? Wheezing. ? Prolonged cough. ? Coughing up blood. ? A change in your usual mucus.  You have a stiff neck.  You have changes in your: ? Vision. ? Hearing. ? Thinking. ? Mood. Summary  An upper respiratory infection (URI) is a common infection of the nose, throat, and upper air passages that lead to the lungs.  A URI is caused by a virus.  URIs usually get better on their own within 7-10 days.  Medicines cannot cure URIs, but your health care provider may recommend certain medicines to help relieve symptoms. This information is not intended to replace advice given to you by your health care provider. Make sure you discuss any questions you have with your health care provider. Document Released: 01/05/2001 Document Revised: 02/25/2017 Document Reviewed: 02/25/2017 Elsevier Interactive Patient Education  2019 [employer].

## 2018-09-09 ENCOUNTER — Other Ambulatory Visit: Payer: Self-pay | Admitting: Physician Assistant

## 2018-09-09 DIAGNOSIS — R0789 Other chest pain: Secondary | ICD-10-CM

## 2018-09-12 ENCOUNTER — Other Ambulatory Visit: Payer: Self-pay | Admitting: Physician Assistant

## 2018-10-03 ENCOUNTER — Ambulatory Visit (INDEPENDENT_AMBULATORY_CARE_PROVIDER_SITE_OTHER): Payer: Medicare Other | Admitting: Physician Assistant

## 2018-10-03 ENCOUNTER — Ambulatory Visit (INDEPENDENT_AMBULATORY_CARE_PROVIDER_SITE_OTHER): Payer: Medicare Other

## 2018-10-03 ENCOUNTER — Encounter: Payer: Self-pay | Admitting: Physician Assistant

## 2018-10-03 VITALS — BP 96/67 | HR 71 | Wt 170.0 lb

## 2018-10-03 DIAGNOSIS — R3129 Other microscopic hematuria: Secondary | ICD-10-CM | POA: Insufficient documentation

## 2018-10-03 DIAGNOSIS — N281 Cyst of kidney, acquired: Secondary | ICD-10-CM

## 2018-10-03 DIAGNOSIS — E890 Postprocedural hypothyroidism: Secondary | ICD-10-CM

## 2018-10-03 DIAGNOSIS — M545 Low back pain, unspecified: Secondary | ICD-10-CM

## 2018-10-03 DIAGNOSIS — Z9181 History of falling: Secondary | ICD-10-CM | POA: Diagnosis not present

## 2018-10-03 DIAGNOSIS — E119 Type 2 diabetes mellitus without complications: Secondary | ICD-10-CM | POA: Diagnosis not present

## 2018-10-03 DIAGNOSIS — R809 Proteinuria, unspecified: Secondary | ICD-10-CM

## 2018-10-03 DIAGNOSIS — N2889 Other specified disorders of kidney and ureter: Secondary | ICD-10-CM

## 2018-10-03 DIAGNOSIS — M47817 Spondylosis without myelopathy or radiculopathy, lumbosacral region: Secondary | ICD-10-CM

## 2018-10-03 LAB — POCT GLYCOSYLATED HEMOGLOBIN (HGB A1C): HbA1c, POC (controlled diabetic range): 6.7 % (ref 0.0–7.0)

## 2018-10-03 LAB — POCT URINALYSIS DIPSTICK
Bilirubin, UA: NEGATIVE
Glucose, UA: NEGATIVE
Ketones, UA: NEGATIVE
Leukocytes, UA: NEGATIVE
NITRITE UA: NEGATIVE
PH UA: 6.5 (ref 5.0–8.0)
Protein, UA: POSITIVE — AB
Spec Grav, UA: 1.015 (ref 1.010–1.025)
Urobilinogen, UA: 0.2 E.U./dL

## 2018-10-03 NOTE — Progress Notes (Signed)
HPI:                                                                Sara Gardner is a 68 y.o. female who presents to [facility] today for diabetes follow-up  DMII: taking Metformin and Trulicity 1.5 mg. Compliant with medications.  Last A1C 6.4, 07/05/18 Denies polydipsia, polyuria, polyphagia. Denies blurred vision or vision change. Denies extremity pain, altered sensation and paresthesias.  Denies ulcers/wounds on feet. Hx of DKA/HHS: never Does not monitor blood sugars at home. Hypoglycemia frequency: none Severe hypoglycemia (requiring 3rd party assistance):   Recent fall in January 2020, injuring her left knee and left ankle. Ankle XR showed progressive tricompartmental OA and effusion. Knee XR showed soft tissue swelling.  Left-sided low back pain for approximately 3 weeks, unrelated to fall. Reports symptoms began after traveling to [place]. Pain radiates to left lower abdomen. She is concerned that this could be related to her renal cysts. Denies hematuria or urinary symptoms. Pain fluctuates, but overall is mild. She feels pain is worse if she drinks caffeine. No bowel/bladder dysfunction No radicular symptoms or progressive weakness No saddle numbness Has not been taking any OTC analgesics MRI L-spine shows L5-L6 mild-moderate stenosis and multilevel facet arthropathy  Past Medical History:  Diagnosis Date  . Aortic atherosclerosis ([place]) 06/10/2017  . CAD (coronary artery disease)   . Cancer ([place])   . Cardiomegaly 06/10/2017  . 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 Dermatochalasis of both eyelids 04/07/2017  . Diabetes mellitus without complication ([place])   . Diastolic heart failure, NYHA class 1 ([place])   . GERD (gastroesophageal reflux disease)   . Hypertension   . Lacunar infarction ([place]) 07/20/2018  . Lumbar degenerative disc disease 03/09/2017  . Myocardial infarction ([place]) 2005  .  Papillary thyroid carcinoma ([place])   . Posterior vitreous detachment, right eye 04/07/2017  . Thyroid disease    Past Surgical History:  Procedure Laterality Date  . TOTAL THYROIDECTOMY  2016   Social History   Tobacco Use  . Smoking status: Never Smoker  . Smokeless tobacco: Never Used  Substance Use Topics  . Alcohol use: No   family history includes Breast cancer in her daughter; Hypertension in her brother, mother, sister, and sister.    ROS: negative except as noted in the HPI  Medications: Current Outpatient Medications  Medication Sig Dispense Refill  . AMBULATORY NON FORMULARY MEDICATION Knee-high, medium compression, graduated compression stockings. Apply to lower extremities. [email or web address], Zippered Compression Stockings, medium circ, long length 1 each 0  . ammonium lactate (LAC-HYDRIN) 12 % lotion APP TOPICALLY PRF DRY SKIN  0  . aspirin 81 MG chewable tablet Chew by mouth.    . diclofenac sodium (VOLTAREN) 1 % GEL Apply 4 g topically 4 (four) times daily. To affected joint. 100 g 1  . EPINEPHrine 0.3 mg/0.3 mL IJ SOAJ injection Inject 1 each as directed as needed. 1 Device 1  . esomeprazole (NEXIUM) 20 MG capsule TAKE 1 CAPSULE(20 MG) BY MOUTH DAILY 90 capsule 0  . ipratropium (ATROVENT) 0.06 % nasal spray Place 2 sprays into both nostrils 4 (four) times daily as needed for rhinitis. 15 mL 0  . Lancets (ONETOUCH ULTRASOFT) lancets  Check fasting blood sugar daily and 2 hours after largest meal of the day. Dx DM. 100 each prn  . levothyroxine (SYNTHROID, LEVOTHROID) 100 MCG tablet Take 100 mcg by mouth daily.  1  . lisinopril-hydrochlorothiazide (PRINZIDE,ZESTORETIC) 20-12.5 MG tablet TAKE 1 TABLET BY MOUTH DAILY 90 tablet 1  . metFORMIN (GLUCOPHAGE) 1000 MG tablet TAKE 1 TABLET BY MOUTH EVERY MORNING 90 tablet 0  . metoprolol succinate (TOPROL-XL) 50 MG 24 hr tablet TAKE 1 TABLET(50 MG) BY MOUTH DAILY 90 tablet 0  . nitroGLYCERIN (NITROSTAT) 0.4 MG SL tablet  PLACE 1 TABLET UNDER THE TONGUE EVERY 5 MINUTES, AS NEEDED FOR CHEST PAIN. MAX DOSE 3 TABLETS IN 15 MINUTES. 25 tablet 0  . ONE TOUCH ULTRA TEST test strip USE TO CHECK BLOOD SUGAR EVERY DAY AND 2 HOURS AFTER LARGEST MEAL 100 each 1  . ONETOUCH DELICA LANCETS FINE MISC USE TO CHECK FASTING BLOOD SUGAR DAILY AND 2 HOURS AFTER LARGEST MEAL OF THE DAY. Dx E11.69 100 each 1  . rosuvastatin (CRESTOR) 40 MG tablet Take 1 tablet (40 mg total) by mouth at bedtime. 90 tablet 1  . TRULICITY 1.5 DP/8.2UM SOPN INJECT 1.5 MG( ONE PEN) UNDER THE SKIN ONCE A WEEK AS DIRECTED 6 mL 1   No current facility-administered medications for this visit.    Allergies  Allergen Reactions  . Bee Venom Swelling  . Tuberculin Tests Other (See Comments)  . Tape Rash       Objective:  BP 96/67   Pulse 71   Wt 170 lb (77.1 kg)   BMI 29.64 kg/m  Gen:  alert, not ill-appearing, no distress, appropriate for age 17: head normocephalic without obvious abnormality, conjunctiva and cornea clear, trachea midline Pulm: Normal work of breathing, normal phonation, clear to auscultation bilaterally, no wheezes, rales or rhonchi CV: Normal rate, regular rhythm, s1 and s2 distinct, no murmurs, clicks or rubs  Neuro: alert and oriented x 3, no tremor Abdomen: Soft, left lower quadrant and left flank tenderness, no CVA tenderness MSK: extremities atraumatic, bilateral lower extremity strength 5 out of 5 and symmetric, normal gait and station Back: Atraumatic, no midline tenderness, no SI joint tenderness, left-sided lumbar tenderness, negative straight leg raise bilaterally Skin: intact, no rashes on exposed skin, no jaundice, no cyanosis Psych: well-groomed, cooperative, good eye contact, euthymic mood, affect mood-congruent, speech is articulate, and thought processes clear and goal-directed  Lab Results  Component Value Date   CREATININE 0.89 07/05/2018   BUN 17 07/05/2018   NA 143 07/05/2018   K 4.2 07/05/2018   CL  102 07/05/2018   CO2 34 (H) 07/05/2018   Lab Results  Component Value Date   ALT 20 07/05/2018   AST 17 07/05/2018   BILITOT 0.3 07/05/2018   Lab Results  Component Value Date   HGBA1C 6.7 10/03/2018   Lab Results  Component Value Date   CHOL 176 07/05/2018   HDL 71 07/05/2018   LDLCALC 90 07/05/2018   TRIG 68 07/05/2018   CHOLHDL 2.5 07/05/2018   Lab Results  Component Value Date   TSH 1.46 06/10/2017     Results for orders placed or performed in visit on 10/03/18 (from the past 72 hour(s))  POCT HgB A1C     Status: None   Collection Time: 10/03/18 11:12 AM  Result Value Ref Range   Hemoglobin A1C     HbA1c POC (<> result, manual entry)     HbA1c, POC (prediabetic range)     HbA1c,  POC (controlled diabetic range) 6.7 0.0 - 7.0 %  POCT Urinalysis Dipstick     Status: Abnormal   Collection Time: 10/03/18 11:12 AM  Result Value Ref Range   Color, UA yellow    Clarity, UA clear    Glucose, UA Negative Negative   Bilirubin, UA negative    Ketones, UA negative    Spec Grav, UA 1.015 1.010 - 1.025   Blood, UA trace    pH, UA 6.5 5.0 - 8.0   Protein, UA Positive (A) Negative   Urobilinogen, UA 0.2 0.2 or 1.0 E.U./dL   Nitrite, UA negative    Leukocytes, UA Negative Negative   Appearance     Odor     No results found.  Other Result Information  Acute Interface, Incoming Rad Results - 06/01/2015  1:32 AM EDT COMPARISON: July 05, 2012  INDICATION: Abdominal Pain  TECHNIQUE: Axial CT abdomen and pelvis without IV contrast Sagittal and coronal reconstructions generated and reviewed.  Dose reduction was utilized (automated exposure control, mA or kV adjustment based on patient size, or iterative image reconstruction).   FINDINGS:   ABDOMEN AND PELVIS:  Lower chest: Unremarkable. Stable subcentimeter nodular densities of the lung bases. Prominent coronary artery calcifications.  Liver: Unremarkable.  Gallbladder and biliary tree:  Unremarkable.  Pancreas: Unremarkable.  Spleen: Unremarkable.  Adrenals: Unremarkable.  Kidneys and urinary bladder: Unremarkable other than small likely benign bilateral renal cystic densities. The cystic density along the posterior midpole the right kidney on image 44 of series 2 is mildly complex with high density material posteriorly,  likely representing a mildly complex cyst, unchanged since the prior exam. No obstructive uropathy is identified. No ureteral or urinary bladder calculus is identified. Small calcified phleboliths identified in the pelvis.  Reproductive: The uterus is unremarkable.  Abdominal aorta: Moderate atherosclerotic disease.  Lymph nodes: Unremarkable.  Peritoneum: No free fluid. No free intraperitoneal air.  Gastrointestinal tract: Small hiatal hernia. Normal appendix. Mild colonic diverticulosis without acute diverticulitis.  Abdominal and pelvic walls: Unremarkable.  Bones: Unremarkable other than mild degenerative changes.    IMPRESSION:  1. No acute process is identified.  2. Stable nonemergent findings as described above.     Assessment and Plan: 68 y.o. female with   .Rubby was seen today for hyperglycemia.  Diagnoses and all orders for this visit:  Controlled type 2 diabetes mellitus without complication, without long-term current use of insulin (HCC) -     POCT HgB A1C  Hypothyroidism, postsurgical -     TSH  At moderate risk for fall  Bilateral renal cysts -     COMPLETE METABOLIC PANEL WITH GFR -     US Renal  Complex renal cyst Comments: right Orders: -     COMPLETE METABOLIC PANEL WITH GFR -     US Renal  Facet arthropathy, lumbosacral  Acute left-sided low back pain without sciatica  Proteinuria, unspecified type -     Urinalysis, microscopic only -     Urine Microalbumin w/creat. ratio -     POCT Urinalysis Dipstick  Microscopic hematuria -     Urinalysis, microscopic only -     POCT Urinalysis  Dipstick   Diabetes Well controlled Cont current medications  I think left-sided low back pain is more musculoskeletal and represents a flareup of her facet arthropathy and spinal stenosis in the setting of recent travel and prolonged sitting.  No red flag symptoms. She declines pain relieving medication. "I don't like to take extra stuff."  Provided with home rehab exercises. If symptoms do not improve in 1 week, will refer to physical therapy.  Given her history of bilateral renal cysts and complex renal cyst on the right with trace microscopic blood and proteinuria we will obtain renal ultrasound and urine microscopy today.  Renal function was normal 3 months ago.  We will repeat this today to ensure stability. Last renal imaging was 05/2015,  cystic density along the posterior midpole the right kidney on image 44 of series 2 is mildly complex with high density material posteriorly,  likely representing a mildly complex cyst, unchanged since the prior exam.    Patient education and anticipatory guidance given Patient agrees with treatment plan Follow-up in 3 months or sooner as needed if symptoms worsen or fail to improve  Darlyne Russian PA-C

## 2018-10-04 ENCOUNTER — Encounter: Payer: Self-pay | Admitting: Physician Assistant

## 2018-10-04 DIAGNOSIS — N2889 Other specified disorders of kidney and ureter: Secondary | ICD-10-CM

## 2018-10-04 HISTORY — DX: Other specified disorders of kidney and ureter: N28.89

## 2018-10-04 LAB — URINALYSIS, MICROSCOPIC ONLY
BACTERIA UA: NONE SEEN /HPF
Hyaline Cast: NONE SEEN /LPF
RBC / HPF: NONE SEEN /HPF (ref 0–2)
Squamous Epithelial / HPF: NONE SEEN /HPF (ref <=5)

## 2018-10-04 LAB — MICROALBUMIN / CREATININE URINE RATIO
Creatinine, Urine: 95 mg/dL (ref 20–275)
Microalb Creat Ratio: 83 mcg/mg creat — ABNORMAL HIGH (ref <30)
Microalb, Ur: 7.9 mg/dL

## 2018-10-04 NOTE — Progress Notes (Signed)
Ultrasound shows a 1.7 cm mass in the left kidney. This may represent another complex cyst. Recommend a follow-up MRI to further assess this Someone from imaging will contact her to schedule her MRI

## 2018-10-04 NOTE — Addendum Note (Signed)
Addended by: Nelson Chimes E on: 10/04/2018 09:14 AM   Modules accepted: Orders

## 2018-10-05 ENCOUNTER — Other Ambulatory Visit: Payer: Self-pay | Admitting: Physician Assistant

## 2018-10-05 DIAGNOSIS — R0789 Other chest pain: Secondary | ICD-10-CM

## 2018-10-05 LAB — COMPLETE METABOLIC PANEL WITH GFR
AG Ratio: 1.4 (calc) (ref 1.0–2.5)
ALT: 24 U/L (ref 6–29)
AST: 22 U/L (ref 10–35)
Albumin: 4.2 g/dL (ref 3.6–5.1)
Alkaline phosphatase (APISO): 73 U/L (ref 37–153)
BUN/Creatinine Ratio: 14 (calc) (ref 6–22)
BUN: 14 mg/dL (ref 7–25)
CALCIUM: 10.3 mg/dL (ref 8.6–10.4)
CO2: 32 mmol/L (ref 20–32)
Chloride: 101 mmol/L (ref 98–110)
Creat: 1.01 mg/dL — ABNORMAL HIGH (ref 0.50–0.99)
GFR, EST NON AFRICAN AMERICAN: 57 mL/min/{1.73_m2} — AB (ref >=60)
GFR, Est African American: 66 mL/min/{1.73_m2} (ref >=60)
Globulin: 3.1 g/dL (calc) (ref 1.9–3.7)
Glucose, Bld: 98 mg/dL (ref 65–99)
POTASSIUM: 4.5 mmol/L (ref 3.5–5.3)
Sodium: 144 mmol/L (ref 135–146)
Total Bilirubin: 0.4 mg/dL (ref 0.2–1.2)
Total Protein: 7.3 g/dL (ref 6.1–8.1)

## 2018-10-05 LAB — TSH: TSH: 0.27 m[IU]/L — AB (ref 0.40–4.50)

## 2018-10-09 ENCOUNTER — Other Ambulatory Visit: Payer: Self-pay

## 2018-10-09 ENCOUNTER — Ambulatory Visit: Payer: Medicare Other

## 2018-10-09 DIAGNOSIS — N2889 Other specified disorders of kidney and ureter: Secondary | ICD-10-CM

## 2018-10-09 MED ORDER — GADOBENATE DIMEGLUMINE 529 MG/ML IV SOLN
20.0000 mL | Freq: Once | INTRAVENOUS | Status: AC | PRN
  Administered 2018-10-09: 16 mL via INTRAVENOUS

## 2018-10-24 ENCOUNTER — Other Ambulatory Visit: Payer: Self-pay

## 2018-10-24 MED ORDER — AMMONIUM LACTATE 12 % EX LOTN: TOPICAL_LOTION | CUTANEOUS | 0 refills | Status: DC

## 2018-11-17 ENCOUNTER — Other Ambulatory Visit: Payer: Self-pay | Admitting: Physician Assistant

## 2018-11-17 DIAGNOSIS — K219 Gastro-esophageal reflux disease without esophagitis: Secondary | ICD-10-CM

## 2018-11-17 MED ORDER — ESOMEPRAZOLE MAGNESIUM 20 MG PO CPDR: DELAYED_RELEASE_CAPSULE | ORAL | 0 refills | Status: DC

## 2018-11-30 ENCOUNTER — Ambulatory Visit (INDEPENDENT_AMBULATORY_CARE_PROVIDER_SITE_OTHER): Payer: Medicare Other | Admitting: Physician Assistant

## 2018-11-30 ENCOUNTER — Encounter: Payer: Self-pay | Admitting: Physician Assistant

## 2018-11-30 ENCOUNTER — Emergency Department: Admission: EM | Admit: 2018-11-30 | Discharge: 2018-11-30 | Payer: Medicare Other | Source: Home / Self Care

## 2018-11-30 ENCOUNTER — Other Ambulatory Visit: Payer: Self-pay

## 2018-11-30 VITALS — BP 117/71 | HR 92 | Temp 98.4°F | Resp 18 | Wt 171.0 lb

## 2018-11-30 DIAGNOSIS — F430 Acute stress reaction: Secondary | ICD-10-CM | POA: Insufficient documentation
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
## 2018-11-30 NOTE — Patient Instructions (Addendum)
[facility] and [email or web address] [phone] (Phone) Mediate all types of disputes; work with individuals and groups to maximize constructive interaction and minimize destructive conflict  [place] [phone]    Mindfulness-Based Stress Reduction Mindfulness-based stress reduction (MBSR) is a program that helps people learn to practice mindfulness. Mindfulness is the practice of intentionally paying attention to the present moment. It can be learned and practiced through techniques such as education, breathing exercises, meditation, and yoga. MBSR includes several mindfulness techniques in one program. MBSR works best when you understand the treatment, are willing to try new things, and can commit to spending time practicing what you learn. MBSR training may include learning about:  How your emotions, thoughts, and reactions affect your body.  New ways to respond to things that cause negative thoughts to start (triggers).  How to notice your thoughts and let go of them.  Practicing awareness of everyday things that you normally do without thinking.  The techniques and goals of different types of meditation. What are the benefits of MBSR? MBSR can have many benefits, which include helping you to:  Develop self-awareness. This refers to knowing and understanding yourself.  Learn skills and attitudes that help you to participate in your own health care.  Learn new ways to care for yourself.  Be more accepting about how things are, and let things go.  Be less judgmental and approach things with an open mind.  Be patient with yourself and trust yourself more. MBSR has also been shown to:  Reduce negative emotions, such as depression and anxiety.  Improve memory and focus.  Change how you sense and approach pain.  Boost your body's ability to fight infections.  Help you connect better with other  people.  Improve your sense of well-being. Follow these instructions at home:   Find a local in-person or online MBSR program.  Set aside some time regularly for mindfulness practice.  Find a mindfulness practice that works best for you. This may include one or more of the following: ? Meditation. Meditation involves focusing your mind on a certain thought or activity. ? Breathing awareness exercises. These help you to stay present by focusing on your breath. ? Body scan. For this practice, you lie down and pay attention to each part of your body from head to toe. You can identify tension and soreness and intentionally relax parts of your body. ? Yoga. Yoga involves stretching and breathing, and it can improve your ability to move and be flexible. It can also provide an experience of testing your body's limits, which can help you release stress. ? Mindful eating. This way of eating involves focusing on the taste, texture, color, and smell of each bite of food. Because this slows down eating and helps you feel full sooner, it can be an important part of a weight-loss plan.  Find a podcast or recording that provides guidance for breathing awareness, body scan, or meditation exercises. You can listen to these any time when you have a free moment to rest without distractions.  Follow your treatment plan as told by your health care provider. This may include taking regular medicines and making changes to your diet or lifestyle as recommended. How to practice mindfulness To do a basic awareness exercise:  Find a comfortable place to sit.  Pay attention to the present moment. Observe your thoughts, feelings, and surroundings just as they are.  Avoid placing judgment on yourself, your feelings, or your surroundings.  Make note of any judgment that comes up, and let it go.  Your mind may wander, and that is okay. Make note of when your thoughts drift, and return your attention to the present  moment. To do basic mindfulness meditation:  Find a comfortable place to sit. This may include a stable chair or a firm floor cushion. ? Sit upright with your back straight. Let your arms fall next to your side with your hands resting on your legs. ? If sitting in a chair, rest your feet flat on the floor. ? If sitting on a cushion, cross your legs in front of you.  Keep your head in a neutral position with your chin dropped slightly. Relax your jaw and rest the tip of your tongue on the roof of your mouth. Drop your gaze to the floor. You can close your eyes if you like.  Breathe normally and pay attention to your breath. Feel the air moving in and out of your nose. Feel your belly expanding and relaxing with each breath.  Your mind may wander, and that is okay. Make note of when your thoughts drift, and return your attention to your breath.  Avoid placing judgment on yourself, your feelings, or your surroundings. Make note of any judgment or feelings that come up, let them go, and bring your attention back to your breath.  When you are ready, lift your gaze or open your eyes. Pay attention to how your body feels after the meditation. Where to find more information You can find more information about MBSR from:  Your health care provider.  Community-based meditation centers or programs.  Programs offered near you. Summary  Mindfulness-based stress reduction (MBSR) is a program that teaches you how to intentionally pay attention to the present moment. It is used with other treatments to help you cope better with daily stress, emotions, and pain.  MBSR focuses on developing self-awareness, which allows you to respond to life stress without judgment or negative emotions.  MBSR programs may involve learning different mindfulness practices, such as breathing exercises, meditation, yoga, body scan, or mindful eating. Find a mindfulness practice that works best for you, and set aside time  for it on a regular basis. This information is not intended to replace advice given to you by your health care provider. Make sure you discuss any questions you have with your health care provider. Document Released: 11/18/2016 Document Revised: 11/18/2016 Document Reviewed: 11/18/2016 Elsevier Interactive Patient Education  2019 [employer].

## 2018-11-30 NOTE — ED Notes (Signed)
Pt was not seen. she wanted to see her PCP. appt sch'ed for 9:10am today with Sherlie Ban, PA.

## 2018-12-07 ENCOUNTER — Ambulatory Visit (INDEPENDENT_AMBULATORY_CARE_PROVIDER_SITE_OTHER): Payer: Medicare Other | Admitting: Physician Assistant

## 2018-12-07 ENCOUNTER — Other Ambulatory Visit: Payer: Self-pay

## 2018-12-07 ENCOUNTER — Encounter: Payer: Self-pay | Admitting: Physician Assistant

## 2018-12-07 ENCOUNTER — Telehealth: Payer: Self-pay

## 2018-12-07 VITALS — BP 126/76 | HR 87 | Temp 98.3°F

## 2018-12-07 DIAGNOSIS — M792 Neuralgia and neuritis, unspecified: Secondary | ICD-10-CM | POA: Diagnosis not present

## 2018-12-07 DIAGNOSIS — M791 Myalgia, unspecified site: Secondary | ICD-10-CM | POA: Diagnosis not present

## 2018-12-07 DIAGNOSIS — E890 Postprocedural hypothyroidism: Secondary | ICD-10-CM

## 2018-12-07 DIAGNOSIS — E1142 Type 2 diabetes mellitus with diabetic polyneuropathy: Secondary | ICD-10-CM

## 2018-12-07 NOTE — Patient Instructions (Addendum)
Sara Gardner, [facility]  [address]  [place], [place] [address]  [phone]   Neuropathic Pain Neuropathic pain is pain caused by damage to the nerves that are responsible for certain sensations in your body (sensory nerves). The pain can be caused by:  Damage to the sensory nerves that send signals to your spinal cord and brain (peripheral nervous system).  Damage to the sensory nerves in your brain or spinal cord (central nervous system). Neuropathic pain can make you more sensitive to pain. Even a minor sensation can feel very painful. This is usually a long-term condition that can be difficult to treat. The type of pain differs from person to person. It may:  Start suddenly (acute), or it may develop slowly and last for a long time (chronic).  Come and go as damaged nerves heal, or it may stay at the same level for years.  Cause emotional distress, loss of sleep, and a lower quality of life. What are the causes? The most common cause of this condition is diabetes. Many other diseases and conditions can also cause neuropathic pain. Causes of neuropathic pain can be classified as:  Toxic. This is caused by medicines and chemicals. The most common cause of toxic neuropathic pain is damage from cancer treatments (chemotherapy).  Metabolic. This can be caused by: ? Diabetes. This is the most common disease that damages the nerves. ? Lack of vitamin B from long-term alcohol abuse.  Traumatic. Any injury that cuts, crushes, or stretches a nerve can cause damage and pain. A common example is feeling pain after losing an arm or leg (phantom limb pain).  Compression-related. If a sensory nerve gets trapped or compressed for a long period of time, the blood supply to the nerve can be cut off.  Vascular. Many blood vessel diseases can cause neuropathic pain by decreasing blood supply and oxygen to nerves.  Autoimmune. This type of pain results from diseases  in which the body's defense system (immune system) mistakenly attacks sensory nerves. Examples of autoimmune diseases that can cause neuropathic pain include lupus and multiple sclerosis.  Infectious. Many types of viral infections can damage sensory nerves and cause pain. Shingles infection is a common cause of this type of pain.  Inherited. Neuropathic pain can be a symptom of many diseases that are passed down through families (genetic). What increases the risk? You are more likely to develop this condition if:  You have diabetes.  You smoke.  You drink too much alcohol.  You are taking certain medicines, including medicines that kill cancer cells (chemotherapy) or that treat immune system disorders. What are the signs or symptoms? The main symptom is pain. Neuropathic pain is often described as:  Burning.  Shock-like.  Stinging.  Hot or cold.  Itching. How is this diagnosed? No single test can diagnose neuropathic pain. It is diagnosed based on:  Physical exam and your symptoms. Your health care provider will ask you about your pain. You may be asked to use a pain scale to describe how bad your pain is.  Tests. These may be done to see if you have a high sensitivity to pain and to help find the cause and location of any sensory nerve damage. They include: ? Nerve conduction studies to test how well nerve signals travel through your sensory nerves (electrodiagnostic testing). ? Stimulating your sensory nerves through electrodes on your skin and measuring the response in your spinal cord and brain (somatosensory evoked potential).  Imaging  studies, such as: ? X-rays. ? CT scan. ? MRI. How is this treated? Treatment for neuropathic pain may change over time. You may need to try different treatment options or a combination of treatments. Some options include:  Treating the underlying cause of the neuropathy, such as diabetes, kidney disease, or vitamin deficiencies.   Stopping medicines that can cause neuropathy, such as chemotherapy.  Medicine to relieve pain. Medicines may include: ? Prescription or over-the-counter pain medicine. ? Anti-seizure medicine. ? Antidepressant medicines. ? Pain-relieving patches that are applied to painful areas of skin. ? A medicine to numb the area (local anesthetic), which can be injected as a nerve block.  Transcutaneous nerve stimulation. This uses electrical currents to block painful nerve signals. The treatment is painless.  Alternative treatments, such as: ? Acupuncture. ? Meditation. ? Massage. ? Physical therapy. ? Pain management programs. ? Counseling. Follow these instructions at home: Medicines   Take over-the-counter and prescription medicines only as told by your health care provider.  Do not drive or use heavy machinery while taking prescription pain medicine.  If you are taking prescription pain medicine, take actions to prevent or treat constipation. Your health care provider may recommend that you: ? Drink enough fluid to keep your urine pale yellow. ? Eat foods that are high in fiber, such as fresh fruits and vegetables, whole grains, and beans. ? Limit foods that are high in fat and processed sugars, such as fried or sweet foods. ? Take an over-the-counter or prescription medicine for constipation. Lifestyle   Have a good support system at home.  Consider joining a chronic pain support group.  Do not use any products that contain nicotine or tobacco, such as cigarettes and e-cigarettes. If you need help quitting, ask your health care provider.  Do not drink alcohol. General instructions  Learn as much as you can about your condition.  Work closely with all your health care providers to find the treatment plan that works best for you.  Ask your health care provider what activities are safe for you.  Keep all follow-up visits as told by your health care provider. This is important.  Contact a health care provider if:  Your pain treatments are not working.  You are having side effects from your medicines.  You are struggling with tiredness (fatigue), mood changes, depression, or anxiety. Summary  Neuropathic pain is pain caused by damage to the nerves that are responsible for certain sensations in your body (sensory nerves).  Neuropathic pain may come and go as damaged nerves heal, or it may stay at the same level for years.  Neuropathic pain is usually a long-term condition that can be difficult to treat. Consider joining a chronic pain support group. This information is not intended to replace advice given to you by your health care provider. Make sure you discuss any questions you have with your health care provider. Document Released: 04/08/2004 Document Revised: 07/29/2017 Document Reviewed: 07/29/2017 Elsevier Interactive Patient Education  2019 [employer].
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
## 2018-12-07 NOTE — Telephone Encounter (Signed)
New referral placed Cindy, please fax labs and office note from today

## 2018-12-07 NOTE — Telephone Encounter (Signed)
Pt contacted Dr. Lynnette Caffey office to schedule appointment. They refused to make her one due to her needing a new referral with new sx/dx. Please advise. -EH/RMA

## 2018-12-11 ENCOUNTER — Other Ambulatory Visit: Payer: Self-pay | Admitting: Physician Assistant

## 2018-12-11 DIAGNOSIS — K219 Gastro-esophageal reflux disease without esophagitis: Secondary | ICD-10-CM

## 2018-12-11 DIAGNOSIS — E1169 Type 2 diabetes mellitus with other specified complication: Secondary | ICD-10-CM

## 2018-12-13 ENCOUNTER — Telehealth: Payer: Self-pay | Admitting: Physician Assistant

## 2018-12-13 NOTE — Telephone Encounter (Signed)
Patient states she has left [facility] VM and has not got a return call back. Please call pt

## 2018-12-13 NOTE — Telephone Encounter (Signed)
Attempted to contact Pt, phone went straight to VM. Left VM for Pt to return clinic call.

## 2018-12-14 ENCOUNTER — Encounter: Payer: Self-pay | Admitting: Physician Assistant

## 2018-12-14 ENCOUNTER — Other Ambulatory Visit: Payer: Self-pay | Admitting: Physician Assistant

## 2018-12-14 DIAGNOSIS — T466X5A Adverse effect of antihyperlipidemic and antiarteriosclerotic drugs, initial encounter: Secondary | ICD-10-CM

## 2018-12-14 DIAGNOSIS — M791 Myalgia, unspecified site: Secondary | ICD-10-CM | POA: Insufficient documentation

## 2018-12-14 DIAGNOSIS — E785 Hyperlipidemia, unspecified: Secondary | ICD-10-CM

## 2018-12-14 DIAGNOSIS — I25119 Atherosclerotic heart disease of native coronary artery with unspecified angina pectoris: Secondary | ICD-10-CM

## 2018-12-14 DIAGNOSIS — E1169 Type 2 diabetes mellitus with other specified complication: Secondary | ICD-10-CM

## 2018-12-14 DIAGNOSIS — R748 Abnormal levels of other serum enzymes: Secondary | ICD-10-CM

## 2018-12-14 DIAGNOSIS — D649 Anemia, unspecified: Secondary | ICD-10-CM

## 2018-12-14 HISTORY — DX: Anemia, unspecified: D64.9

## 2018-12-14 LAB — ADVANCED WRITTEN NOTIFICATION (AWN) TEST REFUSAL: AWN TEST REFUSED: 7065

## 2018-12-14 LAB — COMPLETE METABOLIC PANEL WITH GFR
AG Ratio: 1.4 (calc) (ref 1.0–2.5)
ALT: 26 U/L (ref 6–29)
AST: 25 U/L (ref 10–35)
Albumin: 4 g/dL (ref 3.6–5.1)
Alkaline phosphatase (APISO): 60 U/L (ref 37–153)
BUN/Creatinine Ratio: 15 (calc) (ref 6–22)
BUN: 15 mg/dL (ref 7–25)
CO2: 30 mmol/L (ref 20–32)
Calcium: 9.4 mg/dL (ref 8.6–10.4)
Chloride: 107 mmol/L (ref 98–110)
Creat: 1.02 mg/dL — ABNORMAL HIGH (ref 0.50–0.99)
GFR, Est African American: 65 mL/min/{1.73_m2} (ref >=60)
GFR, Est Non African American: 56 mL/min/{1.73_m2} — ABNORMAL LOW (ref >=60)
Globulin: 2.8 g/dL (calc) (ref 1.9–3.7)
Glucose, Bld: 92 mg/dL (ref 65–99)
Potassium: 4 mmol/L (ref 3.5–5.3)
Sodium: 146 mmol/L (ref 135–146)
Total Bilirubin: 0.2 mg/dL (ref 0.2–1.2)
Total Protein: 6.8 g/dL (ref 6.1–8.1)

## 2018-12-14 LAB — CBC WITH DIFFERENTIAL/PLATELET
Absolute Monocytes: 557 cells/uL (ref 200–950)
Basophils Absolute: 41 cells/uL (ref 0–200)
Basophils Relative: 0.7 %
Eosinophils Absolute: 528 cells/uL — ABNORMAL HIGH (ref 15–500)
Eosinophils Relative: 9.1 %
HCT: 35.6 % (ref 35.0–45.0)
Hemoglobin: 11.4 g/dL — ABNORMAL LOW (ref 11.7–15.5)
Lymphs Abs: 2465 cells/uL (ref 850–3900)
MCH: 26.1 pg — ABNORMAL LOW (ref 27.0–33.0)
MCHC: 32 g/dL (ref 32.0–36.0)
MCV: 81.5 fL (ref 80.0–100.0)
MPV: 10.9 fL (ref 7.5–12.5)
Monocytes Relative: 9.6 %
Neutro Abs: 2210 cells/uL (ref 1500–7800)
Neutrophils Relative %: 38.1 %
Platelets: 284 10*3/uL (ref 140–400)
RBC: 4.37 10*6/uL (ref 3.80–5.10)
RDW: 15.8 % — ABNORMAL HIGH (ref 11.0–15.0)
Total Lymphocyte: 42.5 %
WBC: 5.8 10*3/uL (ref 3.8–10.8)

## 2018-12-14 LAB — CK: Total CK: 231 U/L — ABNORMAL HIGH (ref 29–143)

## 2018-12-14 LAB — SEDIMENTATION RATE: Sed Rate: 28 mm/h (ref 0–30)

## 2018-12-14 LAB — C-REACTIVE PROTEIN: CRP: 3.6 mg/L (ref <8.0)

## 2018-12-14 LAB — TSH: TSH: 2.14 mIU/L (ref 0.40–4.50)

## 2018-12-14 MED ORDER — ROSUVASTATIN CALCIUM 20 MG PO TABS: 20.0000 mg | ORAL_TABLET | Freq: Every day | ORAL | 3 refills | Status: DC

## 2018-12-14 NOTE — Telephone Encounter (Signed)
Sent office notes and labs with referral - CF

## 2018-12-14 NOTE — Progress Notes (Signed)
Called patient to discuss lab results Mildly elevated CK level likely due to statin There were two prescriptions on file for Crestor She confirmed that she picked up 40 mg Rx yesterday and has been taking 40 mg Reducing Crestor to 20 mg QHS Recheck CK level in 2 weeks  She also had a mild normocytic anemia (Hgb 11.4) Likely due to renal insufficiency  Recheck CBC, iron indices and reticulocytes

## 2018-12-15 ENCOUNTER — Other Ambulatory Visit: Payer: Self-pay | Admitting: Physician Assistant

## 2018-12-15 DIAGNOSIS — E1169 Type 2 diabetes mellitus with other specified complication: Secondary | ICD-10-CM

## 2019-01-01 ENCOUNTER — Ambulatory Visit: Payer: Medicare Other | Admitting: Physician Assistant

## 2019-01-02 ENCOUNTER — Other Ambulatory Visit: Payer: Self-pay

## 2019-01-02 MED ORDER — METFORMIN HCL 1000 MG PO TABS: 1000.0000 mg | ORAL_TABLET | Freq: Every morning | ORAL | 0 refills | Status: DC

## 2019-01-04 ENCOUNTER — Ambulatory Visit (INDEPENDENT_AMBULATORY_CARE_PROVIDER_SITE_OTHER): Payer: Medicare Other | Admitting: Physician Assistant

## 2019-01-04 ENCOUNTER — Encounter: Payer: Self-pay | Admitting: Physician Assistant

## 2019-01-04 ENCOUNTER — Ambulatory Visit: Payer: Medicare Other | Admitting: Physician Assistant

## 2019-01-04 VITALS — BP 107/73 | HR 68 | Temp 98.0°F | Wt 169.0 lb

## 2019-01-04 DIAGNOSIS — M791 Myalgia, unspecified site: Secondary | ICD-10-CM

## 2019-01-04 DIAGNOSIS — E1142 Type 2 diabetes mellitus with diabetic polyneuropathy: Secondary | ICD-10-CM

## 2019-01-04 DIAGNOSIS — T466X5A Adverse effect of antihyperlipidemic and antiarteriosclerotic drugs, initial encounter: Secondary | ICD-10-CM

## 2019-01-04 DIAGNOSIS — D649 Anemia, unspecified: Secondary | ICD-10-CM | POA: Diagnosis not present

## 2019-01-04 DIAGNOSIS — R748 Abnormal levels of other serum enzymes: Secondary | ICD-10-CM | POA: Diagnosis not present

## 2019-01-04 DIAGNOSIS — I878 Other specified disorders of veins: Secondary | ICD-10-CM
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
## 2019-01-04 NOTE — Patient Instructions (Signed)
Creatine Kinase Test Why am I having this test? The creatine kinase (CK) test is done to check for damage to muscle tissue in the body. When muscles are damaged, they release CK into the bloodstream.  This test can be used to help diagnose a heart attack or diseases of the skeletal muscles, brain, or spinal cord. What is being tested? The creatine kinase test may measure the following:  The total amount of CK in your blood (total CK).  The amount of three different forms of CK (isoenzymes) in the blood: ? CK-MM, which is found in your skeletal muscles and heart. ? CK-MB, which is found mostly in your heart. ? CK-BB, which is found mostly in your brain. What kind of sample is taken?  At least one blood sample is required for this test. It is usually collected by inserting a needle into a blood vessel. In some cases, you may need to have blood samples taken at regular intervals for up to 1 week. Tell a health care provider about:  All medicines you are taking, including vitamins, herbs, eye drops, creams, and over-the-counter medicines.  Any blood disorders you have.  Any surgeries you have had.  Any medical conditions you have.  Whether you are pregnant or may be pregnant. How are the results reported? Your results will be reported as values that indicate:  How much total CK is in your blood, given as units per liter (units/L).  How much of each measured isoenzyme is in your blood, given as a percentage. Your health care provider will compare your results to normal ranges that were established after testing a large group of people (reference values). Reference values may vary among labs and hospitals. For total CK, common reference values are ranges that vary by age:  Adult or elderly (values are higher after exercise): ? Female: 55-170 units/L. ? Female: 30-135 units/L.  Newborn: 68-580 units/L. Reference values for each isoenzyme are:  CK-MM: 100%.  CK-MB: 0%.  CK-BB:  0%. What do the results mean? Results within reference ranges and values are normal. Levels of total CK that are higher than the reference ranges may mean that you have an injury or a disease affecting your heart, skeletal muscles, or brain. High levels of CK-MM may mean that you have:  Certain conditions affecting the skeletal muscle. A variety of conditions can lead to breakdown of skeletal muscle (rhabdomyolysis).  A recent history of surgery or injury.  Conditions that cause convulsions. These are episodes of uncontrollable movement caused by sudden, intense tightening (contraction) of the muscles. High levels of CK-MB may mean that you have:  A recent history of heart attack.  Other conditions that cause injury to the heart muscle. High levels of CK-BB may be caused by:  Taking certain psychiatric medicines.  A disease that affects the brain and spinal cord (central nervous system).  Certain types of cancer.  Injury to the lungs. Talk with your health care provider about what your results mean. Questions to ask your health care provider Ask your health care provider, or the department that is doing the test:  When will my results be ready?  How will I get my results?  What are my treatment options?  What other tests do I need?  What are my next steps? Summary  The creatine kinase (CK) test is done to check for damage to muscle tissue in the body.  This test can be used to help diagnose a heart attack or diseases of  the skeletal muscles, brain, or spinal cord.  This test involves measuring total CK and three different forms of CK in the blood.  Talk to your health care provider about what your results may mean. This information is not intended to replace advice given to you by your health care provider. Make sure you discuss any questions you have with your health care provider. Document Released: 08/12/2004 Document Revised: 02/10/2017 Document Reviewed: 02/10/2017  Elsevier Interactive Patient Education  2019 [employer].

## 2019-01-05 LAB — RETICULOCYTES
ABS Retic: 60320 cells/uL (ref 20000–8000)
Retic Ct Pct: 1.3 %

## 2019-01-05 LAB — CBC (INCLUDES DIFF/PLT) WITH PATHOLOGIST REVIEW
Absolute Monocytes: 546 cells/uL (ref 200–950)
Basophils Absolute: 61 cells/uL (ref 0–200)
Basophils Relative: 1.2 %
Eosinophils Absolute: 398 cells/uL (ref 15–500)
Eosinophils Relative: 7.8 %
HCT: 38.6 % (ref 35.0–45.0)
Hemoglobin: 12.4 g/dL (ref 11.7–15.5)
Lymphs Abs: 2321 cells/uL (ref 850–3900)
MCH: 26.7 pg — ABNORMAL LOW (ref 27.0–33.0)
MCHC: 32.1 g/dL (ref 32.0–36.0)
MCV: 83.2 fL (ref 80.0–100.0)
MPV: 10.4 fL (ref 7.5–12.5)
Monocytes Relative: 10.7 %
Neutro Abs: 1775 cells/uL (ref 1500–7800)
Neutrophils Relative %: 34.8 %
Platelets: 256 10*3/uL (ref 140–400)
RBC: 4.64 10*6/uL (ref 3.80–5.10)
RDW: 15.8 % — ABNORMAL HIGH (ref 11.0–15.0)
Total Lymphocyte: 45.5 %
WBC: 5.1 10*3/uL (ref 3.8–10.8)

## 2019-01-05 LAB — HEMOGLOBIN A1C
Hgb A1c MFr Bld: 6.2 % of total Hgb — ABNORMAL HIGH (ref <5.7)
Mean Plasma Glucose: 131 (calc)
eAG (mmol/L): 7.3 (calc)

## 2019-01-05 LAB — CK TOTAL AND CKMB (NOT AT ARMC)
CK, MB: 1 ng/mL (ref 0–5.0)
Relative Index: 0.6 (ref 0–4.0)
Total CK: 156 U/L — ABNORMAL HIGH (ref 29–143)

## 2019-01-05 LAB — IRON,TIBC AND FERRITIN PANEL
%SAT: 15 % (calc) — ABNORMAL LOW (ref 16–45)
Ferritin: 42 ng/mL (ref 16–288)
Iron: 54 ug/dL (ref 45–160)
TIBC: 372 mcg/dL (calc) (ref 250–450)

## 2019-01-28 ENCOUNTER — Encounter: Payer: Self-pay | Admitting: Physician Assistant

## 2019-02-09 ENCOUNTER — Other Ambulatory Visit: Payer: Self-pay | Admitting: Physician Assistant

## 2019-02-09 DIAGNOSIS — E785 Hyperlipidemia, unspecified: Secondary | ICD-10-CM

## 2019-02-09 DIAGNOSIS — E1169 Type 2 diabetes mellitus with other specified complication: Secondary | ICD-10-CM

## 2019-02-13 ENCOUNTER — Other Ambulatory Visit: Payer: Self-pay

## 2019-02-13 DIAGNOSIS — R600 Localized edema: Secondary | ICD-10-CM

## 2019-02-13 MED ORDER — BLOOD GLUCOSE METER KIT: PACK | 0 refills | Status: DC

## 2019-03-08 ENCOUNTER — Other Ambulatory Visit: Payer: Self-pay | Admitting: Physician Assistant

## 2019-03-08 DIAGNOSIS — K219 Gastro-esophageal reflux disease without esophagitis: Secondary | ICD-10-CM

## 2019-03-14 ENCOUNTER — Other Ambulatory Visit: Payer: Self-pay | Admitting: Physician Assistant

## 2019-03-16 ENCOUNTER — Telehealth: Payer: Self-pay

## 2019-03-16 ENCOUNTER — Other Ambulatory Visit: Payer: Self-pay

## 2019-03-16 DIAGNOSIS — Z20822 Contact with and (suspected) exposure to covid-19: Secondary | ICD-10-CM

## 2019-03-16 NOTE — Telephone Encounter (Signed)
Sara Gardner called and left a message stating she wanted to get tested for COVID-19. I called and left a message for a return call.

## 2019-03-16 NOTE — Telephone Encounter (Signed)
[name] states she thinks she has been exposed to COVID-19. She reports no symptoms. She has been advised to go to the drive through [id number] testing site in [place].

## 2019-03-17 LAB — NOVEL CORONAVIRUS, NAA: SARS-CoV-2, NAA: NOT DETECTED

## 2019-03-19 ENCOUNTER — Other Ambulatory Visit: Payer: Self-pay

## 2019-03-19 DIAGNOSIS — I10 Essential (primary) hypertension: Secondary | ICD-10-CM

## 2019-03-19 MED ORDER — LISINOPRIL-HYDROCHLOROTHIAZIDE 20-12.5 MG PO TABS: 1.0000 | ORAL_TABLET | Freq: Every day | ORAL | 1 refills | Status: DC

## 2019-04-04 ENCOUNTER — Ambulatory Visit: Payer: Medicare Other | Admitting: Physician Assistant

## 2019-04-05 ENCOUNTER — Ambulatory Visit (INDEPENDENT_AMBULATORY_CARE_PROVIDER_SITE_OTHER): Payer: Medicare Other | Admitting: Physician Assistant

## 2019-04-05 ENCOUNTER — Other Ambulatory Visit: Payer: Self-pay

## 2019-04-05 ENCOUNTER — Encounter: Payer: Self-pay | Admitting: Physician Assistant

## 2019-04-05 VITALS — BP 101/66 | HR 73 | Temp 97.9°F | Wt 165.0 lb

## 2019-04-05 DIAGNOSIS — I517 Cardiomegaly: Secondary | ICD-10-CM

## 2019-04-05 DIAGNOSIS — N2889 Other specified disorders of kidney and ureter: Secondary | ICD-10-CM | POA: Diagnosis not present

## 2019-04-05 DIAGNOSIS — Z0100 Encounter for examination of eyes and vision without abnormal findings: Secondary | ICD-10-CM

## 2019-04-05 DIAGNOSIS — M542 Cervicalgia: Secondary | ICD-10-CM

## 2019-04-05 DIAGNOSIS — E119 Type 2 diabetes mellitus without complications: Secondary | ICD-10-CM

## 2019-04-05 DIAGNOSIS — F22 Delusional disorders: Secondary | ICD-10-CM | POA: Insufficient documentation

## 2019-04-05 DIAGNOSIS — E785 Hyperlipidemia, unspecified: Secondary | ICD-10-CM

## 2019-04-05 DIAGNOSIS — I25119 Atherosclerotic heart disease of native coronary artery with unspecified angina pectoris: Secondary | ICD-10-CM

## 2019-04-05 DIAGNOSIS — H6993 Unspecified Eustachian tube disorder, bilateral: Secondary | ICD-10-CM | POA: Insufficient documentation

## 2019-04-05 DIAGNOSIS — H6983 Other specified disorders of Eustachian tube, bilateral: Secondary | ICD-10-CM

## 2019-04-05 DIAGNOSIS — E1169 Type 2 diabetes mellitus with other specified complication: Secondary | ICD-10-CM

## 2019-04-05 DIAGNOSIS — I700 Atherosclerosis of aorta: Secondary | ICD-10-CM

## 2019-04-05 DIAGNOSIS — N289 Disorder of kidney and ureter, unspecified: Secondary | ICD-10-CM | POA: Insufficient documentation

## 2019-04-05 DIAGNOSIS — E890 Postprocedural hypothyroidism: Secondary | ICD-10-CM | POA: Diagnosis not present

## 2019-04-05 DIAGNOSIS — Z23 Encounter for immunization: Secondary | ICD-10-CM | POA: Diagnosis not present

## 2019-04-05 DIAGNOSIS — R9082 White matter disease, unspecified: Secondary | ICD-10-CM | POA: Insufficient documentation

## 2019-04-05 LAB — POCT GLYCOSYLATED HEMOGLOBIN (HGB A1C): HbA1c, POC (controlled diabetic range): 6.7 % (ref 0.0–7.0)

## 2019-04-05 MED ORDER — FLUTICASONE PROPIONATE 50 MCG/ACT NA SUSP: 1.0000 | Freq: Every day | NASAL | 6 refills | Status: DC

## 2019-04-05 NOTE — Patient Instructions (Signed)
Diabetes Preventive Care: - annual foot exam, self foot exams at least weekly - annual dilated eye exam with an eye doctor - pneumonia vaccine once (booster in 5 years and at age 68) - annual influenza vaccine - twice yearly dental cleanings and yearly exam - blood pressure goal <130/80 - LDL cholesterol goal <70 - A1C goal <7.0 - body mass index (BMI) less than or equal to 29.0 (ideally below 25) - follow-up every 3 months if your A1C is not at goal - follow-up every 6 months if diabetes is well controlled  
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
## 2019-04-06 LAB — BASIC METABOLIC PANEL WITH GFR
BUN/Creatinine Ratio: 16 (calc) (ref 6–22)
BUN: 18 mg/dL (ref 7–25)
CO2: 32 mmol/L (ref 20–32)
Calcium: 9.6 mg/dL (ref 8.6–10.4)
Chloride: 105 mmol/L (ref 98–110)
Creat: 1.13 mg/dL — ABNORMAL HIGH (ref 0.50–0.99)
GFR, Est African American: 58 mL/min/{1.73_m2} — ABNORMAL LOW (ref >=60)
GFR, Est Non African American: 50 mL/min/{1.73_m2} — ABNORMAL LOW (ref >=60)
Glucose, Bld: 88 mg/dL (ref 65–99)
Potassium: 3.9 mmol/L (ref 3.5–5.3)
Sodium: 143 mmol/L (ref 135–146)

## 2019-04-14 NOTE — Progress Notes (Signed)
Referring-Sara Rob Bunting, PA-C Reason for referral-coronary artery disease  HPI: 68 year old female for evaluation of coronary artery disease at request of [name], PA-C.  Previously followed by Dr. Roslynn Amble.  Apparently had myocardial infarction in 2005; cardiac catheterization revealed occluded infarct vessel which was treated medically.  Not all records available.  Echocardiogram July 2017 showed normal LV function, grade 1 diastolic dysfunction left atrial enlargement.  Carotid Dopplers January 2020 showed no significant stenosis. Last nuclear study April 2020 showed ejection fraction 80%.  Moderate partially reversible perfusion defect posterior lateral wall; cannot exclude ischemia.  Patient treated medically.  Patient now presents to establish.  She denies dyspnea, chest pain, palpitations or syncope.  She does not have claudication.  Current Outpatient Medications  Medication Sig Dispense Refill  . AMBULATORY NON FORMULARY MEDICATION Knee-high, medium compression, graduated compression stockings. Apply to lower extremities. [email or web address], Zippered Compression Stockings, medium circ, long length 1 each 0  . ammonium lactate (LAC-HYDRIN) 12 % lotion APP TOPICALLY PRF DRY SKIN 400 g 0  . aspirin 81 MG chewable tablet Chew by mouth.    . blood glucose meter kit and supplies Dispense based on patient and insurance preference. Check daily. (FOR ICD-10 E10.9, E11.9). 1 each 0  . diclofenac sodium (VOLTAREN) 1 % GEL Apply 4 g topically 4 (four) times daily. To affected joint. 100 g 1  . EPINEPHrine 0.3 mg/0.3 mL IJ SOAJ injection Inject 1 each as directed as needed. 1 Device 1  . esomeprazole (NEXIUM) 20 MG capsule TAKE ONE CAPSULE BY MOUTH DAILY 90 capsule 0  . fluticasone (FLONASE) 50 MCG/ACT nasal spray Place 1 spray into both nostrils daily. 16 g 6  . isosorbide mononitrate (IMDUR) 60 MG 24 hr tablet Take 60 mg by mouth daily.    . Lancets  (ONETOUCH ULTRASOFT) lancets Check fasting blood sugar daily and 2 hours after largest meal of the day. Dx DM. 100 each prn  . levothyroxine (SYNTHROID, LEVOTHROID) 100 MCG tablet 100 mg daily, except for 50 mg on Sunday  1  . lisinopril-hydrochlorothiazide (ZESTORETIC) 20-12.5 MG tablet Take 1 tablet by mouth daily. 90 tablet 1  . metFORMIN (GLUCOPHAGE) 1000 MG tablet TAKE 1 TABLET BY MOUTH EVERY MORNING 90 tablet 0  . metoprolol succinate (TOPROL-XL) 100 MG 24 hr tablet Take 100 mg by mouth daily. Take with or immediately following a meal.    . nitroGLYCERIN (NITROSTAT) 0.4 MG SL tablet DISSOLVE 1 TABLET UNDER THE TONGUE EVERY 5 MINUTES, AS NEEDED FOR CHEST PAIN. MAX 3 TABLETS IN 15 MINUTES 25 tablet 0  . ONETOUCH DELICA LANCETS FINE MISC USE TO CHECK FASTING BLOOD SUGAR DAILY AND 2 HOURS AFTER LARGEST MEAL OF THE DAY. Dx E11.69 100 each 1  . ONETOUCH ULTRA test strip USE TO CHECK BLOOD SUGAR EVERY DAY AND 2 HOURS AFTER LARGEST MEAL 100 each 3  . rosuvastatin (CRESTOR) 20 MG tablet Take 1 tablet (20 mg total) by mouth at bedtime. 90 tablet 3  . TRULICITY 1.5 YT/0.1SW SOPN INJECT 1.5 MG( ONE PEN) UNDER THE SKIN ONCE A WEEK AS DIRECTED 6 mL 0   No current facility-administered medications for this visit.     Allergies  Allergen Reactions  . Bee Venom Swelling  . Tuberculin Tests Other (See Comments)  . Tape Rash     Past Medical History:  Diagnosis Date  . Aortic atherosclerosis ([place]) 06/10/2017  . CAD (coronary artery disease)   . Cancer ([place])   . Cardiomegaly 06/10/2017  .  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 Dermatochalasis of both eyelids 04/07/2017  . Diabetes mellitus without complication ([place])   . Diastolic heart failure, NYHA class 1 ([place])   . GERD (gastroesophageal reflux disease)   . Hypertension   . Lacunar infarction ([place]) 07/20/2018  . Left renal mass 10/04/2018   1.7 cm hypoechoic mass 10/04/2018  . Lumbar degenerative disc disease 03/09/2017   . Myocardial infarction ([place]) 2005  . Normocytic anemia 12/14/2018  . Papillary thyroid carcinoma ([place])   . Posterior vitreous detachment, right eye 04/07/2017  . Thyroid disease     Past Surgical History:  Procedure Laterality Date  . TOTAL THYROIDECTOMY  2016    Social History   Socioeconomic History  . Marital status: Divorced    Spouse name: Not on file  . Number of children: 1  . Years of education: Not on file  . Highest education level: Not on file  Occupational History  . Not on file  Social Needs  . Financial resource strain: Not on file  . Food insecurity    Worry: Not on file    Inability: Not on file  . Transportation needs    Medical: Not on file    Non-medical: Not on file  Tobacco Use  . Smoking status: Former Research scientist (life sciences)  . Smokeless tobacco: Never Used  Substance and Sexual Activity  . Alcohol use: No  . Drug use: No  . Sexual activity: Not Currently  Lifestyle  . Physical activity    Days per week: Not on file    Minutes per session: Not on file  . Stress: Not on file  Relationships  . Social Herbalist on phone: Not on file    Gets together: Not on file    Attends religious service: Not on file    Active member of club or organization: Not on file    Attends meetings of clubs or organizations: Not on file    Relationship status: Not on file  . Intimate partner violence    Fear of current or ex partner: Not on file    Emotionally abused: Not on file    Physically abused: Not on file    Forced sexual activity: Not on file  Other Topics Concern  . Not on file  Social History Narrative  . Not on file    Family History  Problem Relation Age of Onset  . Hypertension Mother   . Hypertension Sister   . Hypertension Brother   . Breast cancer Daughter   . 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 Remaining systems are negative.  Physical Exam:   Blood pressure 110/64, pulse 74, height 5' 3.5" (1.613 m), weight 165 lb 1.9 oz (74.9 kg).  General:  Well developed/well nourished in NAD Skin warm/dry Patient not depressed No peripheral clubbing Back-normal HEENT-normal/normal eyelids Neck supple/normal carotid upstroke bilaterally; no bruits; no JVD; no thyromegaly chest - CTA/ normal expansion CV - RRR/normal S1 and S2; no murmurs, rubs or gallops;  PMI nondisplaced Abdomen -NT/ND, no HSM, no mass, + bowel sounds, no bruit 2+ femoral pulses, no bruits Ext-no edema, chords, 2+ DP Neuro-grossly nonfocal  ECG -normal sinus rhythm at a rate of 74, no ST changes.  Personally reviewed  A/P  1 chest pain-patient has had no  recurrent chest pain.  Previous nuclear study showed no ischemia.  No plans for further evaluation at this point.  2 coronary artery disease-continue aspirin and statin.  3 hypertension-patient's blood pressure is controlled.  Continue present medications and follow.  4 hyperlipidemia-continue Crestor 40 mg daily.  Lipids and liver monitored by primary care.  Kirk Ruths, MD

## 2019-04-16 ENCOUNTER — Ambulatory Visit: Payer: Medicare Other

## 2019-04-16 ENCOUNTER — Other Ambulatory Visit: Payer: Self-pay

## 2019-04-16 MED ORDER — GADOBUTROL 1 MMOL/ML IV SOLN
7.5000 mL | Freq: Once | INTRAVENOUS | Status: AC | PRN
  Administered 2019-04-16: 11:00:00 7.5 mL via INTRAVENOUS

## 2019-04-16 NOTE — Progress Notes (Signed)
Benign, stable cysts in both kidneys No further follow-up imaging is needed

## 2019-04-18 ENCOUNTER — Encounter: Payer: Self-pay | Admitting: Cardiology

## 2019-04-18 ENCOUNTER — Other Ambulatory Visit: Payer: Self-pay

## 2019-04-18 ENCOUNTER — Ambulatory Visit (INDEPENDENT_AMBULATORY_CARE_PROVIDER_SITE_OTHER): Payer: Medicare Other | Admitting: Cardiology

## 2019-04-18 VITALS — BP 110/64 | HR 74 | Ht 63.5 in | Wt 165.1 lb

## 2019-04-18 DIAGNOSIS — I10 Essential (primary) hypertension: Secondary | ICD-10-CM

## 2019-04-18 DIAGNOSIS — I25119 Atherosclerotic heart disease of native coronary artery with unspecified angina pectoris: Secondary | ICD-10-CM | POA: Diagnosis not present

## 2019-04-18 DIAGNOSIS — E7800 Pure hypercholesterolemia, unspecified: Secondary | ICD-10-CM

## 2019-04-18 LAB — HM DIABETES EYE EXAM

## 2019-04-18 NOTE — Patient Instructions (Signed)
Medication Instructions:  NO CHANGE If you need a refill on your cardiac medications before your next appointment, please call your pharmacy.   Lab work: If you have labs (blood work) drawn today and your tests are completely normal, you will receive your results only by: . MyChart Message (if you have MyChart) OR . A paper copy in the mail If you have any lab test that is abnormal or we need to change your treatment, we will call you to review the results.  Follow-Up: At [facility], you and your health needs are our priority.  As part of our continuing mission to provide you with exceptional heart care, we have created designated Provider Care Teams.  These Care Teams include your primary Cardiologist (physician) and Advanced Practice Providers (APPs -  Physician Assistants and Nurse Practitioners) who all work together to provide you with the care you need, when you need it. Your physician wants you to follow-up in: 6 MONTHS WITH DR CRENSHAW You will receive a reminder letter in the mail two months in advance. If you don't receive a letter, please call our office to schedule the follow-up appointment.      

## 2019-05-05 ENCOUNTER — Other Ambulatory Visit: Payer: Self-pay | Admitting: Physician Assistant

## 2019-05-05 DIAGNOSIS — E1169 Type 2 diabetes mellitus with other specified complication: Secondary | ICD-10-CM

## 2019-05-07 NOTE — Telephone Encounter (Signed)
Fraser Din has appointment with Dr Sheppard Coil in December.

## 2019-05-24 ENCOUNTER — Other Ambulatory Visit: Payer: Self-pay | Admitting: Physician Assistant

## 2019-05-24 DIAGNOSIS — K219 Gastro-esophageal reflux disease without esophagitis: Secondary | ICD-10-CM

## 2019-06-09 ENCOUNTER — Other Ambulatory Visit: Payer: Self-pay | Admitting: Physician Assistant

## 2019-06-09 DIAGNOSIS — E785 Hyperlipidemia, unspecified: Secondary | ICD-10-CM

## 2019-06-09 DIAGNOSIS — E1169 Type 2 diabetes mellitus with other specified complication: Secondary | ICD-10-CM

## 2019-06-11 ENCOUNTER — Other Ambulatory Visit: Payer: Self-pay | Admitting: *Deleted

## 2019-06-11 MED ORDER — METFORMIN HCL 1000 MG PO TABS: 1000.0000 mg | ORAL_TABLET | Freq: Every morning | ORAL | 0 refills | Status: DC

## 2019-06-13 ENCOUNTER — Telehealth: Payer: Self-pay | Admitting: Osteopathic Medicine

## 2019-06-13 NOTE — Telephone Encounter (Signed)
Patient called and was exposed to Covid-19 and was offered an appointment and she declined a virtual visit and will check around for testing.

## 2019-06-14 ENCOUNTER — Other Ambulatory Visit: Payer: Self-pay

## 2019-06-14 DIAGNOSIS — Z20822 Contact with and (suspected) exposure to covid-19: Secondary | ICD-10-CM

## 2019-06-16 LAB — NOVEL CORONAVIRUS, NAA: SARS-CoV-2, NAA: NOT DETECTED

## 2019-07-02 ENCOUNTER — Other Ambulatory Visit: Payer: Self-pay | Admitting: Osteopathic Medicine

## 2019-07-02 DIAGNOSIS — I252 Old myocardial infarction: Secondary | ICD-10-CM

## 2019-07-02 DIAGNOSIS — I10 Essential (primary) hypertension: Secondary | ICD-10-CM

## 2019-07-02 DIAGNOSIS — E890 Postprocedural hypothyroidism: Secondary | ICD-10-CM

## 2019-07-02 DIAGNOSIS — E785 Hyperlipidemia, unspecified: Secondary | ICD-10-CM

## 2019-07-02 DIAGNOSIS — E1169 Type 2 diabetes mellitus with other specified complication: Secondary | ICD-10-CM

## 2019-07-05 ENCOUNTER — Ambulatory Visit (INDEPENDENT_AMBULATORY_CARE_PROVIDER_SITE_OTHER): Payer: Medicare Other | Admitting: Osteopathic Medicine

## 2019-07-05 ENCOUNTER — Other Ambulatory Visit: Payer: Self-pay

## 2019-07-05 ENCOUNTER — Encounter: Payer: Self-pay | Admitting: Osteopathic Medicine

## 2019-07-05 VITALS — BP 108/66 | HR 66 | Temp 97.8°F | Wt 161.0 lb

## 2019-07-05 DIAGNOSIS — E1169 Type 2 diabetes mellitus with other specified complication: Secondary | ICD-10-CM

## 2019-07-05 DIAGNOSIS — E785 Hyperlipidemia, unspecified: Secondary | ICD-10-CM | POA: Diagnosis not present

## 2019-07-05 DIAGNOSIS — R1032 Left lower quadrant pain: Secondary | ICD-10-CM

## 2019-07-05 LAB — POCT GLYCOSYLATED HEMOGLOBIN (HGB A1C): Hemoglobin A1C: 6.5 % — AB (ref 4.0–5.6)

## 2019-07-05 NOTE — Patient Instructions (Signed)
A1C today is 6.5 Looking good!

## 2019-07-05 NOTE — Progress Notes (Signed)
HPI: Sara Gardner is a 68 y.o. female who  has a past medical history of Aortic atherosclerosis ([place]) (06/10/2017), CAD (coronary artery disease), Cancer ([place]), Cardiomegaly (06/10/2017), Combined form of age-related cataract, both eyes (04/07/2017), Dermatochalasis of both eyelids (04/07/2017), Diabetes mellitus without complication ([place]), Diastolic heart failure, NYHA class 1 ([place]), GERD (gastroesophageal reflux disease), Hypertension, Lacunar infarction ([place]) (07/20/2018), Left renal mass (10/04/2018), Lumbar degenerative disc disease (03/09/2017), Myocardial infarction ([place]) (2005), Normocytic anemia (12/14/2018), Papillary thyroid carcinoma ([place]), Posterior vitreous detachment, right eye (04/07/2017), and Thyroid disease.  she presents to [facility] today, 07/05/19,  for chief complaint of:  DM2 follow-up Lower abdominal pain    DIABETES SCREENING/PREVENTIVE CARE: A1C past 3-6 mos: Yes  controlled? Yes   01/04/19: 6.2  04/05/19: 6.7  Today 07/05/19:  Current meds:   Metformin 123XX123 mg daily  Trulicity 1.5 mg weekly  BP goal <130/80: Yes   BP Readings from Last 3 Encounters:  07/05/19 108/66  04/18/19 110/64  04/05/19 101/66   LDL goal <70: was 90 in 06/2018 Eye exam annually: Yes , importance discussed with patient Foot exam: Yes  Microalbuminuria:n/a on ACE/ARB Metformin: Yes  ACE/ARB: Yes  Antiplatelet if ASCVD Risk >10%: Yes  Statin: Yes  Pneumovax: Yes    Immunization History  Administered Date(s) Administered  . Fluad Quad(high Dose 65+) 04/05/2019  . Influenza Inj Mdck Quad Pf 04/21/2015  . Influenza, High Dose Seasonal PF 05/28/2016, 04/24/2018  . Pneumococcal Conjugate-13 06/24/2014  . Pneumococcal Polysaccharide-23 01/10/2017  . Tdap 05/19/2015      Complaint today: Lower abdominal pain Patient reports yesterday had a sharp pain in left lower abdomen/pelvic area, not really in the hip, no radiation into the groin,  she reports sometimes she will get lower back pain that radiates around in a similar fashion.  Denies bloody stool, diarrhea, constipation, urinary problems.  Nothing seems to make it worse or trigger it, nothing seems to make it better it resolved on its own.  Has had similar episodes a few times over the course of the year        At today's visit 07/05/19 ... PMH, PSH, FH reviewed and updated as needed.  Current medication list and allergy/intolerance hx reviewed and updated as needed. (See remainder of HPI, ROS, Phys Exam below)   No results found.  Results for orders placed or performed in visit on 07/05/19 (from the past 72 hour(s))  POCT HgB A1C     Status: Abnormal   Collection Time: 07/05/19  8:41 AM  Result Value Ref Range   Hemoglobin A1C 6.5 (A) 4.0 - 5.6 %   HbA1c POC (<> result, manual entry)     HbA1c, POC (prediabetic range)     HbA1c, POC (controlled diabetic range)            ASSESSMENT/PLAN: The primary encounter diagnosis was DM type 2 with diabetic dyslipidemia ([place]). A diagnosis of Left lower quadrant pain was also pertinent to this visit.  Patient was advised that the brief cramping nature of the pain made it unlikely that this would be anything like arthritis, kidney stones, mass.  Seems more likely to be digestive, no history of surgeries in that area to concern for scar tissue/adhesions.  If persist/worsens, would consider imaging  A1c under good control and other preventive measures are up-to-date  Orders Placed This Encounter  Procedures  . POCT HgB A1C     No orders of the defined types were placed in this  encounter.   Patient Instructions  A1C today is 6.5 Looking good!       Follow-up plan: Return in about 4 months (around 11/03/2019) for [place] (get labs prior to visit, orders are  in).                                                 ################################################# ################################################# ################################################# #################################################    No outpatient medications have been marked as taking for the 07/05/19 encounter (Office Visit) with Emeterio Reeve, DO.    Allergies  Allergen Reactions  . Bee Venom Swelling  . Tuberculin Tests Other (See Comments)  . Tape Rash       Review of Systems:  Constitutional: No recent illness  HEENT: No  headache, no vision change  Cardiac: No  chest pain, No  pressure, No palpitations  Respiratory:  No  shortness of breath. No  Cough  Gastrointestinal: +abdominal pain, no change on bowel habits  Musculoskeletal: No new myalgia/arthralgia  Neurologic: No  weakness, No  Dizziness  Psychiatric: No  concerns with depression, No  concerns with anxiety  Exam:  BP 108/66 (BP Location: Left Arm, Patient Position: Sitting, Cuff Size: Normal)   Pulse 66   Temp 97.8 F (36.6 C) (Oral)   Wt 161 lb (73 kg)   BMI 28.07 kg/m   Constitutional: VS see above. General Appearance: alert, well-developed, well-nourished, NAD  Eyes: Normal lids and conjunctive, non-icteric sclera  Ears, Nose, Mouth, Throat: MMM, Normal external inspection ears/nares/mouth/lips/gums.  Neck: No masses, trachea midline.   Respiratory: Normal respiratory effort. no wheeze, no rhonchi, no rales  Cardiovascular: S1/S2 normal, no murmur, no rub/gallop auscultated. RRR.   Musculoskeletal: Gait normal. Symmetric and independent movement of all extremities, on left hip, normal strength to hip flexion against resistance, negative logroll, negative FABER FADIR  Abdominal: non-tender, non-distended, no appreciable organomegaly, neg Murphy's, BS WNLx4  Neurological: Normal balance/coordination. No  tremor.  Skin: warm, dry, intact.   Psychiatric: Normal judgment/insight. Normal mood and affect. Oriented x3.       Visit summary with medication list and pertinent instructions was printed for patient to review, patient was advised to alert [place] if any updates are needed. All questions at time of visit were answered - patient instructed to contact office with any additional concerns. ER/RTC precautions were reviewed with the patient and understanding verbalized.   Note: Total time spent 25 minutes, greater than 50% of the visit was spent face-to-face counseling and coordinating care for the following: The primary encounter diagnosis was DM type 2 with diabetic dyslipidemia ([place]). A diagnosis of Left lower quadrant pain was also pertinent to this visit.Marland Kitchen  Please note: voice recognition software was used to produce this document, and typos may escape review. Please contact Dr. Sheppard Coil for any needed clarifications.    Follow up plan: Return in about 4 months (around 11/03/2019) for [place] (get labs prior to visit, orders are in).

## 2019-08-07 ENCOUNTER — Telehealth: Payer: Self-pay

## 2019-08-07 DIAGNOSIS — E1169 Type 2 diabetes mellitus with other specified complication: Secondary | ICD-10-CM

## 2019-08-07 MED ORDER — TRULICITY 1.5 MG/0.5ML ~~LOC~~ SOAJ: 1.0000 "pen " | SUBCUTANEOUS | 0 refills | Status: DC

## 2019-08-07 NOTE — Telephone Encounter (Signed)
Pat requests a chest xray. She needs one for work because she can't have a PPD.

## 2019-08-07 NOTE — Telephone Encounter (Signed)
Before I order diagnostic tests, I need to see what sort of paperwork she has or a request form [facility] with the specific requirements.  For instance she might be able to get a blood draw rather than a chest x-ray.

## 2019-08-07 NOTE — Telephone Encounter (Signed)
Patient will bring by form when she gets back to [facility].

## 2019-08-21 ENCOUNTER — Encounter: Payer: Self-pay | Admitting: Osteopathic Medicine

## 2019-08-21 ENCOUNTER — Ambulatory Visit (INDEPENDENT_AMBULATORY_CARE_PROVIDER_SITE_OTHER): Payer: Medicare Other | Admitting: Osteopathic Medicine

## 2019-08-21 ENCOUNTER — Other Ambulatory Visit: Payer: Self-pay

## 2019-08-21 VITALS — BP 108/74 | HR 75 | Temp 98.0°F | Wt 161.1 lb

## 2019-08-21 DIAGNOSIS — Z0184 Encounter for antibody response examination: Secondary | ICD-10-CM | POA: Diagnosis not present

## 2019-08-21 NOTE — Progress Notes (Signed)
Sara Gardner is a 69 y.o. female who presents to  [place] at [facility]  today, 08/21/19, seeking care for the following:  The encounter diagnosis was Immunity status testing.    ASSESSMENT & PLAN with other pertinent history/findings:  Needs testing for COVID, TB and confirm Hep B immunity prior to starting job/volunteer. Feels otherwise well.      Orders Placed This Encounter  Procedures  . Novel Coronavirus, NAA ([facility])  . Hepatitis B surface antibody,qualitative  . QuantiFERON-TB Gold Plus    No orders of the defined types were placed in this encounter.   There are no Patient Instructions on file for this visit.    Follow-up instructions: Return for [place].     There were no vitals taken for this visit.  No outpatient medications have been marked as taking for the 08/21/19 encounter (Appointment) with Emeterio Reeve, DO.    No results found for this or any previous visit (from the past 72 hour(s)).  No results found.  Depression screen [facility] 2/9 04/05/2019 03/21/2018 12/21/2017  Decreased Interest 0 0 0  Down, Depressed, Hopeless 0 0 0  PHQ - 2 Score 0 0 0    GAD 7 : Generalized Anxiety Score 03/21/2018  Nervous, Anxious, on Edge 0  Control/stop worrying 0  Worry too much - different things 0  Trouble relaxing 0  Restless 0  Easily annoyed or irritable 0  Afraid - awful might happen 0  Total GAD 7 Score 0      All questions at time of visit were answered - patient instructed to contact office with any additional concerns or updates.  ER/RTC precautions were reviewed with the patient.  Please note: voice recognition software was used to produce this document, and typos may escape review. Please contact Dr. Sheppard Coil for any needed clarifications.   Total encounter time: 20 minutes.

## 2019-08-23 LAB — NOVEL CORONAVIRUS, NAA: SARS-CoV-2, NAA: NOT DETECTED

## 2019-08-24 LAB — QUANTIFERON-TB GOLD PLUS
Mitogen-NIL: >10 IU/mL
NIL: 0.06 IU/mL
QuantiFERON-TB Gold Plus: NEGATIVE
TB1-NIL: 0.01 IU/mL
TB2-NIL: 0.01 IU/mL

## 2019-08-24 LAB — HEPATITIS B SURFACE ANTIBODY,QUALITATIVE: Hep B S Ab: NONREACTIVE

## 2019-08-27 ENCOUNTER — Ambulatory Visit: Payer: Medicare Other

## 2019-09-03 ENCOUNTER — Other Ambulatory Visit: Payer: Self-pay | Admitting: Physician Assistant

## 2019-09-03 ENCOUNTER — Other Ambulatory Visit: Payer: Self-pay

## 2019-09-03 DIAGNOSIS — I10 Essential (primary) hypertension: Secondary | ICD-10-CM

## 2019-09-03 MED ORDER — METFORMIN HCL 1000 MG PO TABS: 1000.0000 mg | ORAL_TABLET | Freq: Every morning | ORAL | 0 refills | Status: DC

## 2019-09-10 ENCOUNTER — Other Ambulatory Visit: Payer: Self-pay

## 2019-09-10 DIAGNOSIS — K219 Gastro-esophageal reflux disease without esophagitis: Secondary | ICD-10-CM

## 2019-09-10 MED ORDER — ESOMEPRAZOLE MAGNESIUM 20 MG PO CPDR: 20.0000 mg | DELAYED_RELEASE_CAPSULE | Freq: Every day | ORAL | 0 refills | Status: DC

## 2019-09-11 ENCOUNTER — Encounter: Payer: Self-pay | Admitting: Medical-Surgical

## 2019-09-11 ENCOUNTER — Other Ambulatory Visit: Payer: Self-pay

## 2019-09-11 ENCOUNTER — Ambulatory Visit (INDEPENDENT_AMBULATORY_CARE_PROVIDER_SITE_OTHER): Payer: Medicare Other | Admitting: Medical-Surgical

## 2019-09-11 VITALS — BP 116/67 | HR 67 | Temp 98.0°F | Ht 63.5 in | Wt 164.0 lb

## 2019-09-11 DIAGNOSIS — M549 Dorsalgia, unspecified: Secondary | ICD-10-CM | POA: Diagnosis not present

## 2019-09-11 DIAGNOSIS — N2889 Other specified disorders of kidney and ureter: Secondary | ICD-10-CM

## 2019-09-11 MED ORDER — MELOXICAM 15 MG PO TABS: 15.0000 mg | ORAL_TABLET | Freq: Every day | ORAL | 0 refills | Status: DC

## 2019-09-11 NOTE — Progress Notes (Signed)
Subjective:    CC: Back pain  HPI: 69 year old female presenting today with reports of back pain since Thursday evening.  Pain is aching with occasional sharp/stabbing, bilateral extending from lower ribs to hips.  She reports she previously had kidney masses bilaterally that caused a similar pain. Denies numbness, tingling, incontinence, or weakness of BLE. No interventions tried.   I reviewed the past medical history, family history, social history, surgical history, and allergies today and no changes were needed.  Please see the problem list section below in epic for further details.  Past Medical History: Past Medical History:  Diagnosis Date  . Aortic atherosclerosis ([place]) 06/10/2017  . CAD (coronary artery disease)   . Cancer ([place])   . Cardiomegaly 06/10/2017  . 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 Dermatochalasis of both eyelids 04/07/2017  . Diabetes mellitus without complication ([place])   . Diastolic heart failure, NYHA class 1 ([place])   . GERD (gastroesophageal reflux disease)   . Hypertension   . Lacunar infarction ([place]) 07/20/2018  . Left renal mass 10/04/2018   1.7 cm hypoechoic mass 10/04/2018  . Lumbar degenerative disc disease 03/09/2017  . Myocardial infarction ([place]) 2005  . Normocytic anemia 12/14/2018  . Papillary thyroid carcinoma ([place])   . Posterior vitreous detachment, right eye 04/07/2017  . Thyroid disease    Past Surgical History: Past Surgical History:  Procedure Laterality Date  . TOTAL THYROIDECTOMY  2016   Social History: Social History   Socioeconomic History  . Marital status: Divorced    Spouse name: Not on file  . Number of children: 1  . Years of education: Not on file  . Highest education level: Not on file  Occupational History  . Not on file  Tobacco Use  . Smoking status: Former Research scientist (life sciences)  . Smokeless tobacco: Never Used  Substance and Sexual Activity  . Alcohol use: No  . Drug use: No  . Sexual activity: Not  Currently  Other Topics Concern  . Not on file  Social History Narrative  . Not on file   Social Determinants of Health   Financial Resource Strain:   . Difficulty of Paying Living Expenses: Not on file  Food Insecurity:   . Worried About Charity fundraiser in the Last Year: Not on file  . Ran Out of Food in the Last Year: Not on file  Transportation Needs:   . Lack of Transportation (Medical): Not on file  . Lack of Transportation (Non-Medical): Not on file  Physical Activity:   . Days of Exercise per Week: Not on file  . Minutes of Exercise per Session: Not on file  Stress:   . Feeling of Stress : Not on file  Social Connections:   . Frequency of Communication with Friends and Family: Not on file  . Frequency of Social Gatherings with Friends and Family: Not on file  . Attends Religious Services: Not on file  . Active Member of Clubs or Organizations: Not on file  . Attends Archivist Meetings: Not on file  . Marital Status: Not on file   Family History: Family History  Problem Relation Age of Onset  . Hypertension Mother   . Hypertension Sister   . Hypertension Brother   . Breast cancer Daughter   . Hypertension Sister    Allergies: Allergies  Allergen Reactions  . Bee Venom Swelling  . Tuberculin Tests Other (See Comments)  . Tape Rash   Medications: See med rec.  Review of Systems: No fevers, chills, night sweats, weight loss, chest pain, or shortness of breath.   Objective:    General: Well Developed, well nourished, and in no acute distress.  Neuro: Alert and oriented x3.  HEENT: Normocephalic, atraumatic.  Skin: Warm and dry. Cardiac: Regular rate and rhythm, no murmurs rubs or gallops, no lower extremity edema.  Respiratory: Clear to auscultation bilaterally. Not using accessory muscles, speaking in full sentences. Abdomen: Soft, nondistended.  Bowel sounds positive x4 quadrants. Tender to deep palpation over umbilical and left/right lumbar  regions. No appreciable organomegaly due to guarding with deep palpation. MSK: Diffuse tenderness to palpation over thoracic and lumbar musculature. No limitation in ROM. Strength equal bilaterally.   Impression and Recommendations:    Renal mass, bilateral Discussed MRI findings from 09/2018 and 03/2019 with patient. Patient remains very concerned about kidney masses and expectations for future issues.. Ambulatory referral to Nephrology for further evaluation.  Thoracic and lumbar back pain Treating conservatively with Meloxicam daily for 2 weeks then daily as needed. Advised to use ice/heat, warm baths/showers, gentle stretching, massage. If no improvement in 2 weeks, encouraged to follow up with Dr. Darene Lamer for further evaluation.  Return if symptoms worsen or fail to improve.  ___________________________________________ Clearnce Sorrel, DNP, APRN, FNP-BC [facility] [place]

## 2019-09-12 NOTE — Addendum Note (Signed)
Addended bySamuel Bouche on: 09/12/2019 09:39 AM   Modules accepted: Orders

## 2019-09-19 ENCOUNTER — Other Ambulatory Visit: Payer: Self-pay

## 2019-09-19 ENCOUNTER — Ambulatory Visit (INDEPENDENT_AMBULATORY_CARE_PROVIDER_SITE_OTHER): Payer: Medicare Other | Admitting: Cardiology

## 2019-09-19 ENCOUNTER — Encounter: Payer: Self-pay | Admitting: Cardiology

## 2019-09-19 VITALS — BP 96/54 | HR 83 | Ht 63.5 in | Wt 164.8 lb

## 2019-09-19 DIAGNOSIS — I25119 Atherosclerotic heart disease of native coronary artery with unspecified angina pectoris: Secondary | ICD-10-CM | POA: Diagnosis not present

## 2019-09-19 DIAGNOSIS — I10 Essential (primary) hypertension: Secondary | ICD-10-CM

## 2019-09-19 MED ORDER — LISINOPRIL 10 MG PO TABS: 10.0000 mg | ORAL_TABLET | Freq: Every day | ORAL | 3 refills | Status: DC

## 2019-09-19 NOTE — Progress Notes (Signed)
HPI: FU CAD.  Previously followed by Dr. Roslynn Amble. Apparently had myocardial infarction in 2005; cardiac catheterization revealed occluded infarct vessel which was treated medically. Not all records available. Echocardiogram July 2017 showed normal LV function, grade 1 diastolic dysfunction left atrial enlargement. Carotid Dopplers January 2020 showed no significant stenosis. Last nuclear study April 2020 showed ejection fraction 80%. Moderate perfusion defect posterior lateral wall. Cannot exclude ischemia.. Patient treated medically. Since last seen she denies dyspnea, chest pain, palpitations or syncope.  Current Outpatient Medications  Medication Sig Dispense Refill  . AMBULATORY NON FORMULARY MEDICATION Knee-high, medium compression, graduated compression stockings. Apply to lower extremities. [email or web address], Zippered Compression Stockings, medium circ, long length 1 each 0  . ammonium lactate (LAC-HYDRIN) 12 % lotion APP TOPICALLY PRF DRY SKIN 400 g 0  . aspirin 81 MG chewable tablet Chew by mouth.    . blood glucose meter kit and supplies Dispense based on patient and insurance preference. Check daily. (FOR ICD-10 E10.9, E11.9). 1 each 0  . diclofenac sodium (VOLTAREN) 1 % GEL Apply 4 g topically 4 (four) times daily. To affected joint. 100 g 1  . Dulaglutide (TRULICITY) 1.5 TG/2.5WL SOPN Inject 1 pen into the skin once a week. 6 mL 0  . EPINEPHrine 0.3 mg/0.3 mL IJ SOAJ injection Inject 1 each as directed as needed. 1 Device 1  . esomeprazole (NEXIUM) 20 MG capsule Take 1 capsule (20 mg total) by mouth daily. 90 capsule 0  . fluticasone (FLONASE) 50 MCG/ACT nasal spray Place 1 spray into both nostrils daily. 16 g 6  . isosorbide mononitrate (IMDUR) 60 MG 24 hr tablet Take 60 mg by mouth daily.    . Lancets (ONETOUCH ULTRASOFT) lancets Check fasting blood sugar daily and 2 hours after largest meal of the day. Dx DM. 100 each prn  . levothyroxine (SYNTHROID, LEVOTHROID)  100 MCG tablet 100 mg daily, except for 50 mg on Sunday  1  . lisinopril-hydrochlorothiazide (ZESTORETIC) 20-12.5 MG tablet TAKE 1 TABLET BY MOUTH DAILY 90 tablet 1  . metFORMIN (GLUCOPHAGE) 1000 MG tablet Take 1 tablet (1,000 mg total) by mouth every morning. 90 tablet 0  . metoprolol succinate (TOPROL-XL) 100 MG 24 hr tablet Take 100 mg by mouth daily. Take with or immediately following a meal.    . nitroGLYCERIN (NITROSTAT) 0.4 MG SL tablet DISSOLVE 1 TABLET UNDER THE TONGUE EVERY 5 MINUTES, AS NEEDED FOR CHEST PAIN. MAX 3 TABLETS IN 15 MINUTES 25 tablet 0  . ONETOUCH DELICA LANCETS FINE MISC USE TO CHECK FASTING BLOOD SUGAR DAILY AND 2 HOURS AFTER LARGEST MEAL OF THE DAY. Dx E11.69 100 each 1  . ONETOUCH ULTRA test strip USE TO CHECK BLOOD SUGAR EVERY DAY AND 2 HOURS AFTER LARGEST MEAL 100 each 3  . rosuvastatin (CRESTOR) 40 MG tablet TAKE 1 TABLET BY MOUTH AT BEDTIME 90 tablet 1   No current facility-administered medications for this visit.     Past Medical History:  Diagnosis Date  . Aortic atherosclerosis ([place]) 06/10/2017  . CAD (coronary artery disease)   . Cancer ([place])   . Cardiomegaly 06/10/2017  . 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 Dermatochalasis of both eyelids 04/07/2017  . Diabetes mellitus without complication ([place])   . Diastolic heart failure, NYHA class 1 ([place])   . GERD (gastroesophageal reflux disease)   . Hypertension   . Lacunar infarction ([place]) 07/20/2018  . Left renal mass 10/04/2018   1.7 cm hypoechoic mass  10/04/2018  . Lumbar degenerative disc disease 03/09/2017  . Myocardial infarction ([place]) 2005  . Normocytic anemia 12/14/2018  . Papillary thyroid carcinoma ([place])   . Posterior vitreous detachment, right eye 04/07/2017  . Thyroid disease     Past Surgical History:  Procedure Laterality Date  . TOTAL THYROIDECTOMY  2016    Social History   Socioeconomic History  . Marital status: Divorced    Spouse name: Not on file  .  Number of children: 1  . Years of education: Not on file  . Highest education level: Not on file  Occupational History  . Not on file  Tobacco Use  . Smoking status: Former Research scientist (life sciences)  . Smokeless tobacco: Never Used  Substance and Sexual Activity  . Alcohol use: No  . Drug use: No  . Sexual activity: Not Currently  Other Topics Concern  . Not on file  Social History Narrative  . Not on file   Social Determinants of Health   Financial Resource Strain:   . Difficulty of Paying Living Expenses: Not on file  Food Insecurity:   . Worried About Charity fundraiser in the Last Year: Not on file  . Ran Out of Food in the Last Year: Not on file  Transportation Needs:   . Lack of Transportation (Medical): Not on file  . Lack of Transportation (Non-Medical): Not on file  Physical Activity:   . Days of Exercise per Week: Not on file  . Minutes of Exercise per Session: Not on file  Stress:   . Feeling of Stress : Not on file  Social Connections:   . Frequency of Communication with Friends and Family: Not on file  . Frequency of Social Gatherings with Friends and Family: Not on file  . Attends Religious Services: Not on file  . Active Member of Clubs or Organizations: Not on file  . Attends Archivist Meetings: Not on file  . Marital Status: Not on file  Intimate Partner Violence:   . Fear of Current or Ex-Partner: Not on file  . Emotionally Abused: Not on file  . Physically Abused: Not on file  . Sexually Abused: Not on file    Family History  Problem Relation Age of Onset  . Hypertension Mother   . Hypertension Sister   . Hypertension Brother   . Breast cancer Daughter   . Hypertension Sister     ROS: no fevers or chills, productive cough, hemoptysis, dysphasia, odynophagia, melena, hematochezia, dysuria, hematuria, rash, seizure activity, orthopnea, PND, pedal edema, claudication. Remaining systems are negative.  Physical Exam: Well-developed well-nourished in no  acute distress.  Skin is warm and dry.  HEENT is normal.  Neck is supple.  Chest is clear to auscultation with normal expansion.  Cardiovascular exam is regular rate and rhythm.  Abdominal exam nontender or distended. No masses palpated. Extremities show no edema. neuro grossly intact  A/P  1 coronary artery disease-continue medical therapy with aspirin and statin.  She denies chest pain.  2 hypertension-blood pressure mildly decreased.  Change lisinopril HCT to lisinopril 10 mg daily.  Follow blood pressure and adjust regimen as needed.  Check potassium and renal function.  3 hyperlipidemia-continue statin.  Check lipids and liver.  Kirk Ruths, MD

## 2019-09-19 NOTE — Patient Instructions (Signed)
Medication Instructions:  STOP LISINOPRIL HCTZ AND START LISINOPRIL 10 MG ONCE DAILY  *If you need a refill on your cardiac medications before your next appointment, please call your pharmacy*  Lab Work: Your physician recommends that you HAVE LAB WORK TODAY  If you have labs (blood work) drawn today and your tests are completely normal, you will receive your results only by: Marland Kitchen MyChart Message (if you have MyChart) OR . A paper copy in the mail If you have any lab test that is abnormal or we need to change your treatment, we will call you to review the results.  Follow-Up: At [facility], you and your health needs are our priority.  As part of our continuing mission to provide you with exceptional heart care, we have created designated Provider Care Teams.  These Care Teams include your primary Cardiologist (physician) and Advanced Practice Providers (APPs -  Physician Assistants and Nurse Practitioners) who all work together to provide you with the care you need, when you need it.  Your next appointment:   12 month(s)  The format for your next appointment:   Either In Person or Virtual  Provider:   Kirk Ruths, MD

## 2019-09-20 LAB — COMPREHENSIVE METABOLIC PANEL
ALT: 30 IU/L (ref 0–32)
AST: 24 IU/L (ref 0–40)
Albumin/Globulin Ratio: 1.4 (ref 1.2–2.2)
Albumin: 3.9 g/dL (ref 3.8–4.8)
Alkaline Phosphatase: 75 IU/L (ref 39–117)
BUN/Creatinine Ratio: 14 (ref 12–28)
BUN: 16 mg/dL (ref 8–27)
Bilirubin Total: 0.2 mg/dL (ref 0.0–1.2)
CO2: 28 mmol/L (ref 20–29)
Calcium: 11.3 mg/dL — ABNORMAL HIGH (ref 8.7–10.3)
Chloride: 100 mmol/L (ref 96–106)
Creatinine, Ser: 1.17 mg/dL — ABNORMAL HIGH (ref 0.57–1.00)
GFR calc Af Amer: 55 mL/min/{1.73_m2} — ABNORMAL LOW (ref >59)
GFR calc non Af Amer: 48 mL/min/{1.73_m2} — ABNORMAL LOW (ref >59)
Globulin, Total: 2.8 g/dL (ref 1.5–4.5)
Glucose: 96 mg/dL (ref 65–99)
Potassium: 4.2 mmol/L (ref 3.5–5.2)
Sodium: 143 mmol/L (ref 134–144)
Total Protein: 6.7 g/dL (ref 6.0–8.5)

## 2019-09-20 LAB — LIPID PANEL
Chol/HDL Ratio: 2.1 ratio (ref 0.0–4.4)
Cholesterol, Total: 151 mg/dL (ref 100–199)
HDL: 72 mg/dL (ref >39)
LDL Chol Calc (NIH): 66 mg/dL (ref 0–99)
Triglycerides: 64 mg/dL (ref 0–149)
VLDL Cholesterol Cal: 13 mg/dL (ref 5–40)

## 2019-09-24 ENCOUNTER — Other Ambulatory Visit: Payer: Self-pay | Admitting: *Deleted

## 2019-09-27 ENCOUNTER — Other Ambulatory Visit: Payer: Self-pay | Admitting: Osteopathic Medicine

## 2019-09-27 DIAGNOSIS — Z1231 Encounter for screening mammogram for malignant neoplasm of breast: Secondary | ICD-10-CM

## 2019-09-28 ENCOUNTER — Other Ambulatory Visit: Payer: Self-pay

## 2019-09-28 ENCOUNTER — Ambulatory Visit (INDEPENDENT_AMBULATORY_CARE_PROVIDER_SITE_OTHER): Payer: Medicare Other | Admitting: Osteopathic Medicine

## 2019-09-28 VITALS — Temp 98.3°F

## 2019-09-28 DIAGNOSIS — Z23 Encounter for immunization: Secondary | ICD-10-CM

## 2019-09-28 NOTE — Progress Notes (Signed)
   Subjective:    Patient ID: Sara Gardner, female    DOB: 1951-01-15, 69 y.o.   MRN: [id number]  HPI Patient here today to start Hep B series. Vaccine given in left deltoid without complications. KG LPN   Review of Systems     Objective:   Physical Exam        Assessment & Plan:  Instructed patient to retrun around 10/29/19 for 2nd Hep B vaccine then return for final vaccine around 03/30/20. KG LPN

## 2019-10-06 LAB — BASIC METABOLIC PANEL
BUN/Creatinine Ratio: 12 (ref 12–28)
BUN: 9 mg/dL (ref 8–27)
CO2: 25 mmol/L (ref 20–29)
Calcium: 9.5 mg/dL (ref 8.7–10.3)
Chloride: 107 mmol/L — ABNORMAL HIGH (ref 96–106)
Creatinine, Ser: 0.76 mg/dL (ref 0.57–1.00)
GFR calc Af Amer: 93 mL/min/{1.73_m2} (ref >59)
GFR calc non Af Amer: 80 mL/min/{1.73_m2} (ref >59)
Glucose: 81 mg/dL (ref 65–99)
Potassium: 4.3 mmol/L (ref 3.5–5.2)
Sodium: 145 mmol/L — ABNORMAL HIGH (ref 134–144)

## 2019-10-14 ENCOUNTER — Other Ambulatory Visit: Payer: Self-pay | Admitting: Medical-Surgical

## 2019-10-15 NOTE — Telephone Encounter (Signed)
Your patient, sending for your approval.

## 2019-10-23 ENCOUNTER — Other Ambulatory Visit: Payer: Self-pay

## 2019-10-23 DIAGNOSIS — E1169 Type 2 diabetes mellitus with other specified complication: Secondary | ICD-10-CM

## 2019-10-23 MED ORDER — TRULICITY 1.5 MG/0.5ML ~~LOC~~ SOAJ: 1.0000 "pen " | SUBCUTANEOUS | 2 refills | Status: DC

## 2019-10-26 ENCOUNTER — Ambulatory Visit: Payer: Medicare Other

## 2019-10-26 ENCOUNTER — Ambulatory Visit
Admission: RE | Admit: 2019-10-26 | Discharge: 2019-10-26 | Disposition: A | Discharge status: Home / Self Care | Payer: Medicare Other | Source: Ambulatory Visit | Attending: Osteopathic Medicine | Admitting: Osteopathic Medicine

## 2019-10-26 ENCOUNTER — Other Ambulatory Visit: Payer: Self-pay

## 2019-10-26 DIAGNOSIS — Z1231 Encounter for screening mammogram for malignant neoplasm of breast: Secondary | ICD-10-CM

## 2019-10-29 ENCOUNTER — Ambulatory Visit: Payer: Medicare Other

## 2019-10-31 ENCOUNTER — Other Ambulatory Visit: Payer: Self-pay

## 2019-10-31 ENCOUNTER — Ambulatory Visit (INDEPENDENT_AMBULATORY_CARE_PROVIDER_SITE_OTHER): Payer: Medicare Other | Admitting: Osteopathic Medicine

## 2019-10-31 VITALS — Temp 98.2°F | Ht 63.5 in | Wt 164.0 lb

## 2019-10-31 DIAGNOSIS — Z23 Encounter for immunization: Secondary | ICD-10-CM

## 2019-10-31 NOTE — Progress Notes (Signed)
Patient presents to clinic for second Hepatitis B vaccine. She reports that her arm was a little sore with the first one but then it went away after a day.   She tolerated the second Hepatitis B injection in her left deltoid with no immediate complications. She will call back to make a follow up in September for the last Hepatitis B vaccine. No other questions.

## 2019-11-06 ENCOUNTER — Encounter: Payer: Medicare Other | Admitting: Osteopathic Medicine

## 2019-11-06 DIAGNOSIS — I509 Heart failure, unspecified: Secondary | ICD-10-CM | POA: Insufficient documentation

## 2019-11-15 ENCOUNTER — Other Ambulatory Visit: Payer: Self-pay | Admitting: *Deleted

## 2019-11-16 MED ORDER — METFORMIN HCL 1000 MG PO TABS: 1000.0000 mg | ORAL_TABLET | Freq: Every morning | ORAL | 0 refills | Status: DC

## 2019-11-19 ENCOUNTER — Telehealth (INDEPENDENT_AMBULATORY_CARE_PROVIDER_SITE_OTHER): Payer: Medicare Other

## 2019-11-19 ENCOUNTER — Telehealth: Payer: Self-pay | Admitting: Cardiology

## 2019-11-19 DIAGNOSIS — I10 Essential (primary) hypertension: Secondary | ICD-10-CM

## 2019-11-19 DIAGNOSIS — R030 Elevated blood-pressure reading, without diagnosis of hypertension: Secondary | ICD-10-CM

## 2019-11-19 MED ORDER — LISINOPRIL-HYDROCHLOROTHIAZIDE 20-12.5 MG PO TABS: 1.0000 | ORAL_TABLET | Freq: Every day | ORAL | 3 refills | Status: DC

## 2019-11-19 NOTE — Telephone Encounter (Signed)
Pt called Friday afternoon requesting a referral for thyroid scan. As per pt, she was informed by Endocrinologist that she needs to have one done to keep checking even though she had a thyroidectomy. Pt also mentioned that since her bp meds were switched she has be having elevated blood pressure reading. Pls advise, thanks.

## 2019-11-19 NOTE — Telephone Encounter (Signed)
New Message     Pt c/o BP issue: STAT if pt c/o blurred vision, one-sided weakness or slurred speech  1. What are your last 5 BP readings? 04/23 170/95 04/24 (took old dosage Lisinopril 20/12.5mg ) 134/84 Today 150/83   2. Are you having any other symptoms (ex. Dizziness, headache, blurred vision, passed out)? One day she experienced Dizziness but nothing else   3. What is your BP issue? Pt says her Lisinopril was changed to lisinopril (ZESTRIL) 10 MG tablet because she was having low BP readings. But she says now she is having Higher bp readings. She says most days the top number is over 150     Please advise

## 2019-11-19 NOTE — Telephone Encounter (Signed)
Would agree, would also let patient know that if the endocrinologist wants some sort of thyroid scan, they should be the ones ordering it and they can absolutely arrange this for her.

## 2019-11-19 NOTE — Telephone Encounter (Signed)
Pat called this morning and left a message about her blood pressure being elevated 175/90. She stated she the blood pressure medication was changed in February. I advised her to call cardiology because Dr Stanford Breed changed her blood pressure medication from lisinopril hctz to lisinopril.

## 2019-11-19 NOTE — Telephone Encounter (Signed)
Continue lisinopril HCT at present dose and follow blood pressure.  Check potassium and renal function 1 week. Sara Gardner

## 2019-11-19 NOTE — Telephone Encounter (Signed)
Spoke patient and her blood pressure has been running up as high as 175/90 On 4/24 she resumed the Lisinopril HCT 20/12.5 mg daily  Today blood pressure 150/83  She would like for Dr Stanford Breed to review and refill change, will forward for review

## 2019-11-19 NOTE — Telephone Encounter (Signed)
Spoke with pt, Aware of dr crenshaw's recommendations. New script sent to the pharmacy and Lab orders mailed to the pt  

## 2019-11-20 NOTE — Telephone Encounter (Signed)
Sara Gardner states she wants to transfer her care to [facility]. She is not going to go back to [facility]. She wants [place] to order the ultra sound.

## 2019-11-20 NOTE — Telephone Encounter (Signed)
The ultrasound has already been ordered by her endocrinologist.  I would recommend she at least get this ultrasound done there  since it has already been ordered.   If she insists on [place] ordering the ultrasound:  She would also be responsible for obtaining a disc with the images from [facility] herself - I would need to have that disc to provide to our radiologist before I would order any testing  I would still refer her back to endocrinology if anything was abnormal.   Let me know what she says   21 minutes spent by myself and staff, billed appropriately

## 2019-11-20 NOTE — Addendum Note (Signed)
Addended by: Maryla Morrow on: 11/20/2019 04:27 PM   Modules accepted: Level of Service

## 2019-11-22 NOTE — Telephone Encounter (Signed)
Patient advised, agreeable to do Ultrasound with [facility]. I gave her the number to [place] to schedule.   FYI to PCP

## 2019-11-30 ENCOUNTER — Other Ambulatory Visit: Payer: Self-pay

## 2019-11-30 NOTE — Telephone Encounter (Signed)
Pt calling stating that she is out of medication and has not had her medication for 4 days. Please address

## 2019-12-03 MED ORDER — ISOSORBIDE MONONITRATE ER 60 MG PO TB24: 60.0000 mg | ORAL_TABLET | Freq: Every day | ORAL | 3 refills | Status: DC

## 2019-12-04 ENCOUNTER — Other Ambulatory Visit: Payer: Self-pay

## 2019-12-04 MED ORDER — METFORMIN HCL 1000 MG PO TABS: 1000.0000 mg | ORAL_TABLET | Freq: Every morning | ORAL | 0 refills | Status: DC

## 2019-12-05 ENCOUNTER — Other Ambulatory Visit: Payer: Self-pay

## 2019-12-05 DIAGNOSIS — E1169 Type 2 diabetes mellitus with other specified complication: Secondary | ICD-10-CM

## 2019-12-05 MED ORDER — ROSUVASTATIN CALCIUM 40 MG PO TABS: 40.0000 mg | ORAL_TABLET | Freq: Every day | ORAL | 1 refills | Status: DC

## 2019-12-06 LAB — BASIC METABOLIC PANEL
BUN/Creatinine Ratio: 22 (ref 12–28)
BUN: 21 mg/dL (ref 8–27)
CO2: 29 mmol/L (ref 20–29)
Calcium: 9.8 mg/dL (ref 8.7–10.3)
Chloride: 103 mmol/L (ref 96–106)
Creatinine, Ser: 0.97 mg/dL (ref 0.57–1.00)
GFR calc Af Amer: 69 mL/min/{1.73_m2} (ref >59)
GFR calc non Af Amer: 60 mL/min/{1.73_m2} (ref >59)
Glucose: 59 mg/dL — ABNORMAL LOW (ref 65–99)
Potassium: 3.8 mmol/L (ref 3.5–5.2)
Sodium: 145 mmol/L — ABNORMAL HIGH (ref 134–144)

## 2019-12-07 ENCOUNTER — Encounter: Payer: Self-pay | Admitting: *Deleted

## 2020-02-07 ENCOUNTER — Other Ambulatory Visit: Payer: Self-pay | Admitting: Physician Assistant

## 2020-03-03 ENCOUNTER — Other Ambulatory Visit: Payer: Self-pay | Admitting: Osteopathic Medicine

## 2020-03-10 ENCOUNTER — Emergency Department (INDEPENDENT_AMBULATORY_CARE_PROVIDER_SITE_OTHER): Payer: Medicare Other

## 2020-03-10 ENCOUNTER — Emergency Department (INDEPENDENT_AMBULATORY_CARE_PROVIDER_SITE_OTHER)
Admission: EM | Admit: 2020-03-10 | Discharge: 2020-03-10 | Disposition: A | Discharge status: Home / Self Care | Payer: Medicare Other | Source: Home / Self Care | Attending: Family Medicine | Admitting: Family Medicine

## 2020-03-10 ENCOUNTER — Other Ambulatory Visit: Payer: Self-pay

## 2020-03-10 ENCOUNTER — Encounter: Payer: Self-pay | Admitting: Emergency Medicine

## 2020-03-10 DIAGNOSIS — R109 Unspecified abdominal pain: Secondary | ICD-10-CM

## 2020-03-10 LAB — POCT CBC W AUTO DIFF (K'VILLE URGENT CARE)

## 2020-03-10 LAB — POCT URINALYSIS DIP (MANUAL ENTRY)
Bilirubin, UA: NEGATIVE
Blood, UA: NEGATIVE
Glucose, UA: NEGATIVE mg/dL
Ketones, POC UA: NEGATIVE mg/dL
Leukocytes, UA: NEGATIVE
Nitrite, UA: NEGATIVE
Spec Grav, UA: 1.015 (ref 1.010–1.025)
Urobilinogen, UA: 1 E.U./dL
pH, UA: 6 (ref 5.0–8.0)

## 2020-03-10 NOTE — ED Triage Notes (Addendum)
Low left back pain x 1 week, HX Kidney cysts Vaccinated

## 2020-03-10 NOTE — Discharge Instructions (Addendum)
Call if rash develops. May take Tylenol as needed for pain.

## 2020-03-10 NOTE — ED Provider Notes (Signed)
[name]    CSN: [id number] Arrival date & time: 03/10/20  1438      History   Chief Complaint Chief Complaint  Patient presents with  . Back Pain    HPI Sara Gardner is a 69 y.o. female.   Patient complains of one week history of left lower back and flank pain without urinary symptoms.  During the past several days the pain has radiated to her left lower abdomen.  She feels well otherwise without nausea/vomiting or fevers, chills, and sweats.  She has a history of a cyst on her left kidney.  The history is provided by the patient.    Past Medical History:  Diagnosis Date  . Aortic atherosclerosis ([place]) 06/10/2017  . CAD (coronary artery disease)   . Cancer ([place])   . Cardiomegaly 06/10/2017  . 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 Dermatochalasis of both eyelids 04/07/2017  . Diabetes mellitus without complication ([place])   . Diastolic heart failure, NYHA class 1 ([place])   . GERD (gastroesophageal reflux disease)   . Hypertension   . 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 Left renal mass 10/04/2018   1.7 cm hypoechoic mass 10/04/2018  . Lumbar degenerative disc disease 03/09/2017  . Myocardial infarction ([place]) 2005  . Normocytic anemia 12/14/2018  . Papillary thyroid carcinoma ([place])   . Posterior vitreous detachment, right eye 04/07/2017  . Thyroid disease     Patient Active Problem List   Diagnosis Date Noted  . CHF (congestive heart failure) ([place]) 11/06/2019  . Other specified disorders of kidney and ureter 04/05/2019  . Renal insufficiency 04/05/2019  . 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/05/2019  . Cervicalgia 04/05/2019  . Eustachian tube dysfunction, bilateral 04/05/2019  . 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[place]) 04/05/2019  . White matter abnormality on MRI of brain 04/05/2019  . Elevated creatine kinase 12/14/2018  . Myalgia due to statin 12/14/2018  . Normocytic anemia 12/14/2018  . Acute reaction to situational stress  11/30/2018  . At moderate risk for fall 10/03/2018  . Bilateral renal cysts 10/03/2018  . Facet arthropathy, lumbosacral 10/03/2018  . Microscopic hematuria 10/03/2018  . Lacunar infarction ([place]) 07/20/2018  . Craniofacial pain 07/04/2018  . Proteinuria 03/21/2018  . Hematuria 03/21/2018  . Controlled type 2 diabetes mellitus without complication, without long-term current use of insulin ([place]) 03/08/2018  . Encounter for weight management 01/06/2018  . Urge incontinence 08/23/2017  . 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/23/2017  . Urinary frequency 08/17/2017  . Overactive bladder 08/17/2017  . Equinus contracture of ankle 07/27/2017  . Hammer toes of both feet 07/27/2017  . Dermatophytosis, nail 07/27/2017  . Calcaneal spur of both feet 07/27/2017  . Dysuria 07/07/2017  . Atypical chest pain 06/10/2017  . Aortic atherosclerosis ([place]) 06/10/2017  . Cardiomegaly 06/10/2017  . Vitreous detachment of right eye 05/12/2017  . 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 Dermatochalasis of both eyelids 04/07/2017  . Posterior vitreous detachment, right eye 04/07/2017  . Acute left-sided low back pain without sciatica 03/09/2017  . 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 Lumbar degenerative disc disease 03/09/2017  . Primary osteoarthritis of left knee 03/04/2017  . Chronic pain of left knee 03/04/2017  . Bilateral primary osteoarthritis of knee 12/15/2016  . Bilateral lower extremity edema 12/15/2016  . Allergy to honey bee venom 12/15/2016  . Essential hypertension 02/16/2016  . DM type 2 with diabetic dyslipidemia ([place]) 02/10/2016  . Gastroesophageal reflux disease without esophagitis  02/10/2016  . Breast pain, left 12/30/2015  . 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 Diabetic sensorimotor polyneuropathy ([place]) 10/27/2015  . Papillary thyroid carcinoma ([place]) 08/09/2014  . Post-menopausal atrophic vaginitis 06/24/2014  . Left  thyroid nodule 01/29/2014  . Nodule of left lung 01/21/2014  . Class 1 obesity due to excess calories with serious comorbidity and body mass index (BMI) of 30.0 to 30.9 in adult 06/12/2012  . Palpitations 11/29/2011  . Abnormal mammogram of right breast 08/30/2011  . Arthritis of left knee 05/24/2011  . Coronary artery disease involving native coronary artery of native heart with angina pectoris ([place]) 05/24/2011  . History of acute myocardial infarction of inferior wall 05/24/2011    Past Surgical History:  Procedure Laterality Date  . TOTAL THYROIDECTOMY  2016    OB History   No obstetric history on file.      Home Medications    Prior to Admission medications   Medication Sig Start Date End Date Taking? Authorizing Provider  AMBULATORY NON FORMULARY MEDICATION Knee-high, medium compression, graduated compression stockings. Apply to lower extremities. [email or web address], Zippered Compression Stockings, medium circ, long length 12/15/16   [name], PA-C  ammonium lactate (LAC-HYDRIN) 12 % lotion APPLY TOPICALLY TO THE AFFECTED AREA AS NEEDED FOR DRY SKIN 02/07/20   Emeterio Reeve, DO  aspirin 81 MG chewable tablet Chew by mouth.    [provider]  blood glucose meter kit and supplies Dispense based on patient and insurance preference. Check daily. (FOR ICD-10 E10.9, E11.9). 02/13/19   Emeterio Reeve, DO  diclofenac sodium (VOLTAREN) 1 % GEL Apply 4 g topically 4 (four) times daily. To affected joint. 07/04/18   [name], PA-C  Dulaglutide (TRULICITY) 1.5 GD/9.2EQ SOPN Inject 1 pen into the skin once a week. 10/23/19   Emeterio Reeve, DO  EPINEPHrine 0.3 mg/0.3 mL IJ SOAJ injection Inject 1 each as directed as needed. 12/15/16   [name], PA-C  esomeprazole (NEXIUM) 20 MG capsule Take 1 capsule (20 mg total) by mouth daily. 09/10/19   Emeterio Reeve, DO  fluticasone [facility]) 50 MCG/ACT nasal spray Place  1 spray into both nostrils daily. 04/05/19   [name], PA-C  isosorbide mononitrate (IMDUR) 60 MG 24 hr tablet Take 1 tablet (60 mg total) by mouth daily. 12/03/19   Lelon Perla, MD  Lancets [facility] ULTRASOFT) lancets Check fasting blood sugar daily and 2 hours after largest meal of the day. Dx DM. 10/04/16   [name], PA-C  levothyroxine (SYNTHROID, LEVOTHROID) 100 MCG tablet 100 mg daily, except for 50 mg on Sunday 07/12/16   [provider]  lisinopril-hydrochlorothiazide (ZESTORETIC) 20-12.5 MG tablet Take 1 tablet by mouth daily. 11/19/19   Lelon Perla, MD  metFORMIN (GLUCOPHAGE) 1000 MG tablet TAKE 1 TABLET(1000 MG) BY MOUTH EVERY MORNING 03/03/20   Emeterio Reeve, DO  metoprolol succinate (TOPROL-XL) 100 MG 24 hr tablet Take 100 mg by mouth daily. Take with or immediately following a meal.    [provider]  nitroGLYCERIN (NITROSTAT) 0.4 MG SL tablet DISSOLVE 1 TABLET UNDER THE TONGUE EVERY 5 MINUTES, AS NEEDED FOR CHEST PAIN. MAX 3 TABLETS IN 15 MINUTES 10/06/18   [name], PA-C  ONETOUCH DELICA LANCETS FINE MISC USE TO CHECK FASTING BLOOD SUGAR DAILY AND 2 HOURS AFTER LARGEST MEAL OF THE DAY. Dx E11.69 03/29/18   [name], PA-C  ONETOUCH ULTRA test strip USE TO CHECK BLOOD SUGAR EVERY DAY AND 2 HOURS AFTER LARGEST  MEAL 12/15/18   [name], PA-C  rosuvastatin (CRESTOR) 40 MG tablet Take 1 tablet (40 mg total) by mouth at bedtime. 12/05/19   Emeterio Reeve, DO    Family History Family History  Problem Relation Age of Onset  . Hypertension Mother   . Hypertension Sister   . Hypertension Brother   . Breast cancer Daughter   . Hypertension Sister     Social History Social History   Tobacco Use  . Smoking status: Former Research scientist (life sciences)  . Smokeless tobacco: Never Used  Vaping Use  . Vaping Use: Never used  Substance Use Topics  . Alcohol use: No  . Drug use: No      Allergies   Bee venom, Tuberculin tests, and Tape   Review of Systems Review of Systems  Constitutional: Negative for activity change, appetite change, chills, diaphoresis, fatigue, fever and unexpected weight change.  HENT: Negative.   Eyes: Negative.   Respiratory: Negative.   Cardiovascular: Negative.   Gastrointestinal: Positive for abdominal pain. Negative for abdominal distention, blood in stool, diarrhea, nausea and vomiting.  Genitourinary: Positive for flank pain. Negative for dysuria, frequency, hematuria, pelvic pain, urgency and vaginal discharge.  Musculoskeletal: Positive for back pain.  Skin: Negative for rash.  Neurological: Negative for headaches.     Physical Exam Triage Vital Signs ED Triage Vitals  Enc Vitals Group     BP 03/10/20 1452 127/72     Pulse Rate 03/10/20 1452 67     Resp --      Temp 03/10/20 1452 98.5 F (36.9 C)     Temp Source 03/10/20 1452 Oral     SpO2 03/10/20 1452 96 %     Weight 03/10/20 1455 165 lb (74.8 kg)     Height 03/10/20 1455 5' 3" (1.6 m)     Head Circumference --      Peak Flow --      Pain Score 03/10/20 1453 7     Pain Loc --      Pain Edu? --      Excl. in [place]? --    No data found.  Updated Vital Signs BP 127/72 (BP Location: Right Arm)   Pulse 67   Temp 98.5 F (36.9 C) (Oral)   Ht 5' 3" (1.6 m)   Wt 74.8 kg   SpO2 96%   BMI 29.23 kg/m   Visual Acuity Right Eye Distance:   Left Eye Distance:   Bilateral Distance:    Right Eye Near:   Left Eye Near:    Bilateral Near:     Physical Exam Vitals and nursing note reviewed.  Constitutional:      General: She is not in acute distress.    Appearance: She is not ill-appearing.  HENT:     Head: Normocephalic.     Right Ear: External ear normal.     Left Ear: External ear normal.     Nose: Nose normal.     Mouth/Throat:     Mouth: Mucous membranes are moist.  Eyes:     Conjunctiva/sclera: Conjunctivae normal.     Pupils: Pupils are equal,  round, and reactive to light.  Cardiovascular:     Rate and Rhythm: Normal rate and regular rhythm.     Heart sounds: Normal heart sounds.  Pulmonary:     Breath sounds: Normal breath sounds.       Comments: No rebound tenderness Abdominal:     General: Bowel sounds are normal. There is no  distension.     Palpations: Abdomen is soft. There is no hepatomegaly or splenomegaly.     Tenderness: There is abdominal tenderness in the left lower quadrant.       Comments: Left flank tenderness to palpation.  Musculoskeletal:        General: No tenderness.     Cervical back: Neck supple.     Right lower leg: No edema.     Left lower leg: No edema.  Lymphadenopathy:     Cervical: No cervical adenopathy.  Skin:    General: Skin is warm and dry.     Findings: No rash.  Neurological:     Mental Status: She is alert and oriented to person, place, and time.      UC Treatments / Results  Labs (all labs ordered are listed, but only abnormal results are displayed) Labs Reviewed  POCT URINALYSIS DIP (MANUAL ENTRY) - Abnormal; Notable for the following components:      Result Value   Protein Ur, POC trace (*)    All other components within normal limits  COMPLETE METABOLIC PANEL WITH GFR  POCT CBC W AUTO DIFF ([facility]):  WBC 5.7; LY 55.5; MO 5.6; GR 38.9; Hgb 12.1; Platelets 257     EKG   Radiology CT Renal Stone Study  Result Date: 03/10/2020 CLINICAL DATA:  Left flank pain EXAM: CT ABDOMEN AND PELVIS WITHOUT CONTRAST TECHNIQUE: Multidetector CT imaging of the abdomen and pelvis was performed following the standard protocol without IV contrast. COMPARISON:  MRI 10/09/2018 FINDINGS: Lower chest: The visualized lung bases are clear bilaterally. Extensive multi-vessel coronary artery calcification. Global cardiac size within normal limits. Hepatobiliary: The liver and gallbladder are unremarkable. No intra or extrahepatic biliary ductal dilation. Pancreas: Unremarkable Spleen:  Unremarkable Adrenals/Urinary Tract: The adrenal glands are unremarkable. The kidneys are normal in size and position. 3.1 cm simple exophytic cortical cyst arising from the upper pole the right kidney is unchanged from multiple prior MRI examinations. No hydronephrosis. No intrarenal or ureteral calculi. Stomach/Bowel: Stomach, small bowel, and large bowel are unremarkable. Appendix normal. No free intraperitoneal gas or fluid. Vascular/Lymphatic: Moderate aortoiliac atherosclerotic calcification is present without evidence of aneurysm. No pathologic adenopathy within the abdomen and pelvis. Reproductive: Uterus and bilateral adnexa are unremarkable. Other: The rectum is unremarkable. Musculoskeletal: Degenerative changes are seen within the lumbar spine. No lytic or blastic bone lesions are seen. IMPRESSION: 1. No acute intra-abdominal or pelvic pathology to explain the patient's symptoms. 2. Extensive multi-vessel coronary artery calcification. 3. Aortic atherosclerosis. Aortic Atherosclerosis (ICD10-I70.0). Electronically Signed   By: Fidela Salisbury MD   On: 03/10/2020 16:21    Procedures Procedures (including critical care time)  Medications Ordered in UC Medications - No data to display  Initial Impression / Assessment and Plan / UC Course  I have reviewed the triage vital signs and the nursing notes.  Pertinent labs & imaging results that were available during my care of the patient were reviewed by me and considered in my medical decision making (see chart for details).    Normal CBC reassuring. KUB:  No hydronephrosis. No intrarenal or ureteral calculi. No findings to explain patient's pain. ?shingles (call if rash develops:  Begin Valtrex) Review of records reveals declining renal function during the past year.  Will check CMP. Followup with Family Doctor in about one week.   Final Clinical Impressions(s) / UC Diagnoses   Final diagnoses:  Acute left flank pain     Discharge  Instructions  Call if rash develops. May take Tylenol as needed for pain.    ED Prescriptions    None        Kandra Nicolas, MD 03/17/20 [phone]

## 2020-03-12 LAB — COMPLETE METABOLIC PANEL WITH GFR

## 2020-03-18 ENCOUNTER — Ambulatory Visit: Payer: Medicare Other | Admitting: Osteopathic Medicine

## 2020-03-18 ENCOUNTER — Telehealth: Payer: Self-pay | Admitting: Osteopathic Medicine

## 2020-03-18 NOTE — Telephone Encounter (Signed)
Patient states she didn't get a call from [place]. That she usually get a reminder. Patient made another appointment for tomorrow.

## 2020-03-18 NOTE — Telephone Encounter (Signed)
NO SHOW w/ Dr Sheppard Coil 03/18/20, 11/06/19  If no-show again will result in dismissal  Please call patient to inform of policy

## 2020-03-19 ENCOUNTER — Encounter: Payer: Self-pay | Admitting: Osteopathic Medicine

## 2020-03-19 ENCOUNTER — Other Ambulatory Visit: Payer: Self-pay

## 2020-03-19 ENCOUNTER — Ambulatory Visit (INDEPENDENT_AMBULATORY_CARE_PROVIDER_SITE_OTHER): Payer: Medicare Other | Admitting: Osteopathic Medicine

## 2020-03-19 VITALS — BP 92/60 | HR 79 | Wt 166.0 lb

## 2020-03-19 DIAGNOSIS — I10 Essential (primary) hypertension: Secondary | ICD-10-CM

## 2020-03-19 DIAGNOSIS — M545 Low back pain, unspecified: Secondary | ICD-10-CM

## 2020-03-19 DIAGNOSIS — I252 Old myocardial infarction: Secondary | ICD-10-CM | POA: Diagnosis not present

## 2020-03-19 DIAGNOSIS — E785 Hyperlipidemia, unspecified: Secondary | ICD-10-CM

## 2020-03-19 DIAGNOSIS — E1169 Type 2 diabetes mellitus with other specified complication: Secondary | ICD-10-CM | POA: Diagnosis not present

## 2020-03-19 DIAGNOSIS — I7090 Unspecified atherosclerosis: Secondary | ICD-10-CM | POA: Diagnosis not present

## 2020-03-19 MED ORDER — TRULICITY 1.5 MG/0.5ML ~~LOC~~ SOAJ: 1.5000 mg | SUBCUTANEOUS | 5 refills | Status: DC

## 2020-03-19 NOTE — Progress Notes (Signed)
Sara Gardner is a 69 y.o. female who presents to  [place] at [facility]  today, 03/19/20, seeking care for the following:  . Follow-up urgent care - seen for flank pain, she's not sure what she's following up on today... something about back pain and "blocked arteries" but pt states she know about the atherosclerosis and is not having any chest pain. W/ UC eval she had CT done which showed significant atherosclerosis of aorta, coronary arteries. Known CAD w/ MI in 2005. Follows w/ Dr Stanford Breed (cardiology), last seen 09/19/2019, continue medical management. F/u 12 mos. Pt reports lower back / flank pain is improved but still feels like sharp pain on occasion. Benign lower back and L hip exam.      ASSESSMENT & PLAN with other pertinent findings:  The primary encounter diagnosis was Left-sided low back pain without sciatica, unspecified chronicity. Diagnoses of Dyslipidemia associated with type 2 diabetes mellitus ([place]), Atherosclerosis, History of acute myocardial infarction of inferior wall, and Essential hypertension were also pertinent to this visit.   No results found for this or any previous visit (from the past 24 hour(s)).  Chronic issues stable I think MSK strain explains L lower back and flank pain Se pt instructions    Patient Instructions  Plan: I think this is low back muscle strain OK to use Ibuprofen or Ty enol as needed Try heating pad Printed info for home exercises Can also refer to physical therapy if desired  Let me know if no better after 1-2 weeks or sooner if worse!      No orders of the defined types were placed in this encounter.   Meds ordered this encounter  Medications  . Dulaglutide (TRULICITY) 1.5 DX/4.1OI SOPN    Sig: Inject 0.5 mLs (1.5 mg total) into the skin once a week.    Dispense:  6 mL    Refill:  5       Follow-up instructions: Return if symptoms worsen or fail to  improve.                                         BP 92/60 (BP Location: Left Arm, Patient Position: Sitting)   Pulse 79   Wt 166 lb (75.3 kg)   SpO2 97%   BMI 29.41 kg/m   Current Meds  Medication Sig  . AMBULATORY NON FORMULARY MEDICATION Knee-high, medium compression, graduated compression stockings. Apply to lower extremities. [email or web address], Zippered Compression Stockings, medium circ, long length  . ammonium lactate (LAC-HYDRIN) 12 % lotion APPLY TOPICALLY TO THE AFFECTED AREA AS NEEDED FOR DRY SKIN  . aspirin 81 MG chewable tablet Chew by mouth.  . blood glucose meter kit and supplies Dispense based on patient and insurance preference. Check daily. (FOR ICD-10 E10.9, E11.9).  Marland Kitchen diclofenac sodium (VOLTAREN) 1 % GEL Apply 4 g topically 4 (four) times daily. To affected joint.  . Dulaglutide (TRULICITY) 1.5 NO/6.7EH SOPN Inject 0.5 mLs (1.5 mg total) into the skin once a week.  Marland Kitchen EPINEPHrine 0.3 mg/0.3 mL IJ SOAJ injection Inject 1 each as directed as needed.  Marland Kitchen esomeprazole (NEXIUM) 20 MG capsule Take 1 capsule (20 mg total) by mouth daily.  . fluticasone (FLONASE) 50 MCG/ACT nasal spray Place 1 spray into both nostrils daily.  . isosorbide mononitrate (IMDUR) 60 MG 24 hr tablet Take 1 tablet (60 mg total)  by mouth daily.  . Lancets (ONETOUCH ULTRASOFT) lancets Check fasting blood sugar daily and 2 hours after largest meal of the day. Dx DM.  Marland Kitchen levothyroxine (SYNTHROID, LEVOTHROID) 100 MCG tablet 100 mg daily, except for 50 mg on Sunday  . lisinopril-hydrochlorothiazide (ZESTORETIC) 20-12.5 MG tablet Take 1 tablet by mouth daily.  . metFORMIN (GLUCOPHAGE) 1000 MG tablet TAKE 1 TABLET(1000 MG) BY MOUTH EVERY MORNING  . metoprolol succinate (TOPROL-XL) 100 MG 24 hr tablet Take 100 mg by mouth daily. Take with or immediately following a meal.  . nitroGLYCERIN (NITROSTAT) 0.4 MG SL tablet DISSOLVE 1 TABLET UNDER THE TONGUE EVERY 5 MINUTES, AS  NEEDED FOR CHEST PAIN. MAX 3 TABLETS IN 15 MINUTES  . ONETOUCH DELICA LANCETS FINE MISC USE TO CHECK FASTING BLOOD SUGAR DAILY AND 2 HOURS AFTER LARGEST MEAL OF THE DAY. Dx E11.69  . ONETOUCH ULTRA test strip USE TO CHECK BLOOD SUGAR EVERY DAY AND 2 HOURS AFTER LARGEST MEAL  . rosuvastatin (CRESTOR) 40 MG tablet Take 1 tablet (40 mg total) by mouth at bedtime.  . [DISCONTINUED] Dulaglutide (TRULICITY) 1.5 HY/8.6VH SOPN Inject 1 pen into the skin once a week.    No results found for this or any previous visit (from the past 72 hour(s)).  No results found.     All questions at time of visit were answered - patient instructed to contact office with any additional concerns or updates.  ER/RTC precautions were reviewed with the patient as applicable.   Please note: voice recognition software was used to produce this document, and typos may escape review. Please contact Dr. Sheppard Coil for any needed clarifications.   Total encounter time: 30 minutes.

## 2020-03-19 NOTE — Patient Instructions (Addendum)
Plan: I think this is low back muscle strain OK to use Ibuprofen or Ty enol as needed Try heating pad Printed info for home exercises Can also refer to physical therapy if desired  Let me know if no better after 1-2 weeks or sooner if worse!

## 2020-04-30 ENCOUNTER — Telehealth (INDEPENDENT_AMBULATORY_CARE_PROVIDER_SITE_OTHER): Payer: Medicare Other

## 2020-04-30 NOTE — Telephone Encounter (Signed)
Will not call patient personally. She needs to ask a question and we need to see if staff can handle it or if it needs a visit.

## 2020-04-30 NOTE — Telephone Encounter (Signed)
Sara Gardner called this afternoon stating that this was her second phone call this week to my line, when in fact she did call on Monday screaming for someone to call you back that day, she failed to leave two patient identifiers. Today she left another rude message stating someone needed to call her back and only leaving her last name. I was able to obtain her phone number by accessing it through our voicemail system due to time being available to investigate who the patient was, I called the patient advised patient that we did infact get her message but due to her failing to leave patient identifiers and time we were unable to identify who she was! Patient continued to be irate and rude demanding Dr. Sheppard Coil call her, at the time Dr. Sheppard Coil was in a room with a patient, the patient  would not tell me the the message. I ended the call with patient by saying I would just let Dr. Sheppard Coil know that she is requesting to speak with her.

## 2020-05-01 NOTE — Telephone Encounter (Signed)
Won't hurt but probably won't help anything. I don't recommend it.   5 mins spent by self and staff, billed accordinly

## 2020-05-01 NOTE — Telephone Encounter (Signed)
Pat called to see if she can take Prevagen with her current medications. Please advise.

## 2020-05-02 NOTE — Telephone Encounter (Signed)
Patient advised.

## 2020-06-03 ENCOUNTER — Other Ambulatory Visit: Payer: Self-pay | Admitting: Osteopathic Medicine

## 2020-06-03 DIAGNOSIS — E1169 Type 2 diabetes mellitus with other specified complication: Secondary | ICD-10-CM

## 2020-07-29 ENCOUNTER — Telehealth: Payer: Self-pay | Admitting: Osteopathic Medicine

## 2020-07-29 NOTE — Telephone Encounter (Signed)
Pt called wanting to transfer care to [facility]. She stated back in October she had a bad experience with Dr.Alexander's assistant over the phone. She doesn't want to deal with her anymore. She also stated that if she cannot switch, she will go elsewhere.

## 2020-07-29 NOTE — Telephone Encounter (Signed)
I do not have a problem with the patient switching to me if that is what she truly desires.  On review of her chart, it looks like the person she had poor experience with over the phone on the date in question is no longer employed with [place].  Dr. Lyn Hollingshead will need to weigh in on this regarding her approval.

## 2020-08-04 NOTE — Telephone Encounter (Signed)
Not acceptable reason for switch, as our staff rotates through providers on occasion. Routing to Sara Gardner to maybe call this patient - if she has a complaint about a staff member she needs to address it appropriately
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
## 2020-08-12 ENCOUNTER — Other Ambulatory Visit: Payer: Self-pay | Admitting: Osteopathic Medicine

## 2020-08-12 DIAGNOSIS — Z1231 Encounter for screening mammogram for malignant neoplasm of breast: Secondary | ICD-10-CM

## 2020-08-18 ENCOUNTER — Other Ambulatory Visit: Payer: Self-pay

## 2020-08-18 DIAGNOSIS — K219 Gastro-esophageal reflux disease without esophagitis: Secondary | ICD-10-CM

## 2020-08-18 MED ORDER — ESOMEPRAZOLE MAGNESIUM 20 MG PO CPDR: 20.0000 mg | DELAYED_RELEASE_CAPSULE | Freq: Every day | ORAL | 0 refills | Status: DC

## 2020-08-20 ENCOUNTER — Other Ambulatory Visit: Payer: Self-pay

## 2020-08-20 ENCOUNTER — Emergency Department (INDEPENDENT_AMBULATORY_CARE_PROVIDER_SITE_OTHER)
Admission: EM | Admit: 2020-08-20 | Discharge: 2020-08-20 | Disposition: A | Discharge status: Home / Self Care | Payer: Medicare Other | Source: Home / Self Care

## 2020-08-20 ENCOUNTER — Telehealth: Payer: Self-pay | Admitting: Emergency Medicine

## 2020-08-20 DIAGNOSIS — R112 Nausea with vomiting, unspecified: Secondary | ICD-10-CM

## 2020-08-20 DIAGNOSIS — R079 Chest pain, unspecified: Secondary | ICD-10-CM | POA: Diagnosis not present

## 2020-08-20 DIAGNOSIS — I252 Old myocardial infarction: Secondary | ICD-10-CM

## 2020-08-20 DIAGNOSIS — Z8679 Personal history of other diseases of the circulatory system: Secondary | ICD-10-CM

## 2020-08-20 MED ORDER — ONDANSETRON 4 MG PO TBDP
4.0000 mg | ORAL_TABLET | Freq: Once | ORAL | Status: AC
  Administered 2020-08-20: 4 mg via ORAL

## 2020-08-20 NOTE — ED Triage Notes (Signed)
Patient presents to Urgent Care with complaints of chest pain since 3-4 days ago. Patient reports she was shopping in [place] today and started to feel nauseous in one of the stores. Stopped here on the way home to have the nausea evaluated, vomited in her car on the way here. Chest pain is centralized, states "it feels like someone hit me with a brick, like the walls around my heart are inflamed", feels like the pain radiates down both shoulders.

## 2020-08-20 NOTE — Telephone Encounter (Signed)
Called to make sure pt made it to ED okay, left message encouraging pt to call back.

## 2020-08-20 NOTE — Discharge Instructions (Signed)
  You have declined EMS transport but based on your symptoms and heart history, it is recommended you are evaluated further in the emergency department where more resources and stat labs are available. Please drive directly there. If you feel unsafe driving, find a safe place to pull over and call 911 if needed.

## 2020-08-20 NOTE — ED Notes (Signed)
Patient is being discharged from the Urgent Care and sent to the Emergency Department via POV . Per Leeroy Cha, PA, patient is in need of higher level of care due to chest pain with nausea and significant cardiac history. Patient is aware and verbalizes understanding of plan of care.  Vitals:   08/20/20 1510  BP: (!) 149/97  Pulse: 92  Resp: 15  Temp: 98.2 F (36.8 C)  SpO2: 98%

## 2020-08-20 NOTE — ED Provider Notes (Signed)
[facility]    CSN: [id number] Arrival date & time: 08/20/20  1456      History   Chief Complaint Chief Complaint  Patient presents with  . Chest Pain  . Emesis    HPI Sara Gardner is a 70 y.o. female.   HPI Sara Gardner is a 70 y.o. female presenting to UC with c/o chest pain that started 3-4 days ago but worsened just PTA when she was shopping in [place]. Pt states it feels like she got "a fist to the chest."  Associated nausea and vomiting. Pain was radiating into her shoulder but the shoulder pain has resolved. Hx of endocarditis about 1 year ago. Pt states symptoms feel similar.  She was dx at the hospital at that time.  Denies SOB at  This time. No dizziness or lightheadedness. Nausea has resolved after being given zofran in triage.  No recent URI symptoms. No fever or chills. No known sick contacts.    Past Medical History:  Diagnosis Date  . Aortic atherosclerosis ([place]) 06/10/2017  . CAD (coronary artery disease)   . Cancer ([place])   . Cardiomegaly 06/10/2017  . 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 Dermatochalasis of both eyelids 04/07/2017  . Diabetes mellitus without complication ([place])   . Diastolic heart failure, NYHA class 1 ([place])   . GERD (gastroesophageal reflux disease)   . Hypertension   . Lacunar infarction ([place]) 07/20/2018  . Left renal mass 10/04/2018   1.7 cm hypoechoic mass 10/04/2018  . Lumbar degenerative disc disease 03/09/2017  . Myocardial infarction ([place]) 2005  . Normocytic anemia 12/14/2018  . Papillary thyroid carcinoma ([place])   . Posterior vitreous detachment, right eye 04/07/2017  . Thyroid disease     Patient Active Problem List   Diagnosis Date Noted  . Atherosclerosis 03/19/2020  . CHF (congestive heart failure) ([place]) 11/06/2019  . Other specified disorders of kidney and ureter 04/05/2019  . Renal insufficiency 04/05/2019  . 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/05/2019  .  Cervicalgia 04/05/2019  . Eustachian tube dysfunction, bilateral 04/05/2019  . 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[place]) 04/05/2019  . White matter abnormality on MRI of brain 04/05/2019  . Elevated creatine kinase 12/14/2018  . Myalgia due to statin 12/14/2018  . Normocytic anemia 12/14/2018  . Acute reaction to situational stress 11/30/2018  . At moderate risk for fall 10/03/2018  . Bilateral renal cysts 10/03/2018  . Facet arthropathy, lumbosacral 10/03/2018  . Microscopic hematuria 10/03/2018  . Lacunar infarction ([place]) 07/20/2018  . Craniofacial pain 07/04/2018  . Proteinuria 03/21/2018  . Hematuria 03/21/2018  . Controlled type 2 diabetes mellitus without complication, without long-term current use of insulin ([place]) 03/08/2018  . Encounter for weight management 01/06/2018  . Urge incontinence 08/23/2017  . 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/23/2017  . Urinary frequency 08/17/2017  . Overactive bladder 08/17/2017  . Equinus contracture of ankle 07/27/2017  . Hammer toes of both feet 07/27/2017  . Dermatophytosis, nail 07/27/2017  . Calcaneal spur of both feet 07/27/2017  . Dysuria 07/07/2017  . Atypical chest pain 06/10/2017  . Aortic atherosclerosis ([place]) 06/10/2017  . Cardiomegaly 06/10/2017  . Vitreous detachment of right eye 05/12/2017  . 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 Dermatochalasis of both eyelids 04/07/2017  . Posterior vitreous detachment, right eye 04/07/2017  . Acute left-sided low back pain without sciatica 03/09/2017  . 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 Lumbar degenerative disc disease 03/09/2017  .  Primary osteoarthritis of left knee 03/04/2017  . Chronic pain of left knee 03/04/2017  . Bilateral primary osteoarthritis of knee 12/15/2016  . Bilateral lower extremity edema 12/15/2016  . Allergy to honey bee venom 12/15/2016  . Essential hypertension 02/16/2016  . DM type 2 with diabetic dyslipidemia  ([place]) 02/10/2016  . Gastroesophageal reflux disease without esophagitis 02/10/2016  . Breast pain, left 12/30/2015  . 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 Diabetic sensorimotor polyneuropathy ([place]) 10/27/2015  . Papillary thyroid carcinoma ([place]) 08/09/2014  . Post-menopausal atrophic vaginitis 06/24/2014  . Left thyroid nodule 01/29/2014  . Nodule of left lung 01/21/2014  . Class 1 obesity due to excess calories with serious comorbidity and body mass index (BMI) of 30.0 to 30.9 in adult 06/12/2012  . Palpitations 11/29/2011  . Abnormal mammogram of right breast 08/30/2011  . Arthritis of left knee 05/24/2011  . Coronary artery disease involving native coronary artery of native heart with angina pectoris ([place]) 05/24/2011  . History of acute myocardial infarction of inferior wall 05/24/2011    Past Surgical History:  Procedure Laterality Date  . TOTAL THYROIDECTOMY  2016    OB History   No obstetric history on file.      Home Medications    Prior to Admission medications   Medication Sig Start Date End Date Taking? Authorizing Provider  AMBULATORY NON FORMULARY MEDICATION Knee-high, medium compression, graduated compression stockings. Apply to lower extremities. [email or web address], Zippered Compression Stockings, medium circ, long length 12/15/16   [name], PA-C  ammonium lactate (LAC-HYDRIN) 12 % lotion APPLY TOPICALLY TO THE AFFECTED AREA AS NEEDED FOR DRY SKIN 02/07/20   Emeterio Reeve, DO  aspirin 81 MG chewable tablet Chew by mouth.    [provider]  blood glucose meter kit and supplies Dispense based on patient and insurance preference. Check daily. (FOR ICD-10 E10.9, E11.9). 02/13/19   Emeterio Reeve, DO  diclofenac sodium (VOLTAREN) 1 % GEL Apply 4 g topically 4 (four) times daily. To affected joint. 07/04/18   [name], PA-C  Dulaglutide (TRULICITY) 1.5 ZO/1.0RU SOPN Inject 0.5 mLs (1.5 mg total) into the  skin once a week. 03/19/20   Emeterio Reeve, DO  EPINEPHrine 0.3 mg/0.3 mL IJ SOAJ injection Inject 1 each as directed as needed. 12/15/16   [name], PA-C  esomeprazole (NEXIUM) 20 MG capsule Take 1 capsule (20 mg total) by mouth daily. 08/18/20   Emeterio Reeve, DO  fluticasone [facility]) 50 MCG/ACT nasal spray Place 1 spray into both nostrils daily. 04/05/19   [name], PA-C  isosorbide mononitrate (IMDUR) 60 MG 24 hr tablet Take 1 tablet (60 mg total) by mouth daily. 12/03/19   Lelon Perla, MD  Lancets [facility] ULTRASOFT) lancets Check fasting blood sugar daily and 2 hours after largest meal of the day. Dx DM. 10/04/16   [name], PA-C  levothyroxine (SYNTHROID, LEVOTHROID) 100 MCG tablet 100 mg daily, except for 50 mg on Sunday 07/12/16   [provider]  lisinopril-hydrochlorothiazide (ZESTORETIC) 20-12.5 MG tablet Take 1 tablet by mouth daily. 11/19/19   Lelon Perla, MD  metFORMIN (GLUCOPHAGE) 1000 MG tablet TAKE 1 TABLET(1000 MG) BY MOUTH EVERY MORNING 03/03/20   Emeterio Reeve, DO  metoprolol succinate (TOPROL-XL) 100 MG 24 hr tablet Take 100 mg by mouth daily. Take with or immediately following a meal.    [provider]  nitroGLYCERIN (NITROSTAT) 0.4 MG SL tablet DISSOLVE 1 TABLET UNDER THE TONGUE EVERY 5 MINUTES, AS NEEDED FOR CHEST  PAIN. MAX 3 TABLETS IN 15 MINUTES 10/06/18   [name], PA-C  ONETOUCH DELICA LANCETS FINE MISC USE TO CHECK FASTING BLOOD SUGAR DAILY AND 2 HOURS AFTER LARGEST MEAL OF THE DAY. Dx E11.69 03/29/18   [name], PA-C  ONETOUCH ULTRA test strip USE TO CHECK BLOOD SUGAR EVERY DAY AND 2 HOURS AFTER LARGEST MEAL 12/15/18   [name], PA-C  rosuvastatin (CRESTOR) 40 MG tablet TAKE 1 TABLET(40 MG) BY MOUTH AT BEDTIME 06/05/20   Emeterio Reeve, DO    Family History Family History  Problem Relation Age of Onset  .  Hypertension Mother   . Hypertension Sister   . Hypertension Brother   . Breast cancer Daughter   . Hypertension Sister     Social History Social History   Tobacco Use  . Smoking status: Former Research scientist (life sciences)  . Smokeless tobacco: Never Used  Vaping Use  . Vaping Use: Never used  Substance Use Topics  . Alcohol use: No  . Drug use: No     Allergies   Bee venom, Tuberculin tests, and Tape   Review of Systems Review of Systems  Constitutional: Negative for chills and fever.  HENT: Negative for congestion, ear pain, sore throat, trouble swallowing and voice change.   Respiratory: Negative for cough and shortness of breath.   Cardiovascular: Positive for chest pain. Negative for palpitations.  Gastrointestinal: Positive for nausea and vomiting. Negative for abdominal pain and diarrhea.  Musculoskeletal: Negative for arthralgias, back pain and myalgias.  Skin: Negative for rash.  All other systems reviewed and are negative.    Physical Exam Triage Vital Signs ED Triage Vitals  Enc Vitals Group     BP 08/20/20 1510 (!) 149/97     Pulse Rate 08/20/20 1510 92     Resp 08/20/20 1510 15     Temp 08/20/20 1510 98.2 F (36.8 C)     Temp Source 08/20/20 1510 Oral     SpO2 08/20/20 1510 98 %     Weight --      Height --      Head Circumference --      Peak Flow --      Pain Score 08/20/20 1508 7     Pain Loc --      Pain Edu? --      Excl. in [place]? --    No data found.  Updated Vital Signs BP (!) 149/97 (BP Location: Left Arm)   Pulse 92   Temp 98.2 F (36.8 C) (Oral)   Resp 15   SpO2 98%   Visual Acuity Right Eye Distance:   Left Eye Distance:   Bilateral Distance:    Right Eye Near:   Left Eye Near:    Bilateral Near:     Physical Exam Vitals and nursing note reviewed.  Constitutional:      General: She is not in acute distress.    Appearance: She is well-developed and well-nourished. She is not ill-appearing, toxic-appearing or diaphoretic.  HENT:      Head: Normocephalic and atraumatic.  Eyes:     Extraocular Movements: EOM normal.  Cardiovascular:     Rate and Rhythm: Normal rate and regular rhythm.  Pulmonary:     Effort: Pulmonary effort is normal.     Breath sounds: No decreased breath sounds, wheezing, rhonchi or rales.  Chest:     Chest wall: No tenderness.  Abdominal:     Palpations: Abdomen is soft.  Tenderness: There is no abdominal tenderness.  Musculoskeletal:        General: Normal range of motion.     Cervical back: Normal range of motion.  Skin:    General: Skin is warm and dry.  Neurological:     Mental Status: She is alert and oriented to person, place, and time.  Psychiatric:        Mood and Affect: Mood and affect normal.        Behavior: Behavior normal.      UC Treatments / Results  Labs (all labs ordered are listed, but only abnormal results are displayed) Labs Reviewed - No data to display  EKG Date/Time:08/20/2020    15:14:33 Ventricular Rate: 91 PR Interval: 154 QRS Duration: 76 QT Interval: 374 QTC Calculation: 460 P-R-T axes: 78   45   49 Text Interpretation: Normal sinus rhythm, cannot rule out anterior infarct, age undetermned. Abnormal ECG  No significant change since prior in Sept 2020.   Radiology No results found.  Procedures Procedures (including critical care time)  Medications Ordered in UC Medications  ondansetron (ZOFRAN-ODT) disintegrating tablet 4 mg (4 mg Oral Given 08/20/20 1521)    Initial Impression / Assessment and Plan / UC Course  I have reviewed the triage vital signs and the nursing notes.  Pertinent labs & imaging results that were available during my care of the patient were reviewed by me and considered in my medical decision making (see chart for details).     Although no significant EKG change since Sept 2020, due to pt's age, history and reported symptoms, recommend further cardiac workup in the hospital . Pt declined EMS transport. Plans  to drive herself to [facility] for further evaluation and treatment. AVS given  Final Clinical Impressions(s) / UC Diagnoses   Final diagnoses:  Chest pain, unspecified type  Nausea and vomiting in adult  History of MI (myocardial infarction)  History of endocarditis     Discharge Instructions      You have declined EMS transport but based on your symptoms and heart history, it is recommended you are evaluated further in the emergency department where more resources and stat labs are available. Please drive directly there. If you feel unsafe driving, find a safe place to pull over and call 911 if needed.    ED Prescriptions    None     PDMP not reviewed this encounter.   Noe Gens, [place] 08/20/20 1535

## 2020-08-21 ENCOUNTER — Telehealth: Payer: Self-pay | Admitting: Cardiology

## 2020-08-21 NOTE — Telephone Encounter (Signed)
Pt was returning call to [facility] ,  [place] call back number is [phone]

## 2020-08-21 NOTE — Telephone Encounter (Signed)
Spoke with patient who states that she has been having chest pain for the last 3-4 days. Patient states that the chest pain started in the center of her chest and states that the first day she felt as if a brick hit her in the chest, today she states that the center of her chest is sore but is worth with any exertion. Patient states that she was driving yesterday when she started throwing up, patient states she drove to urgent care to be seen. Per urgent care "patient denied EMS transport to ED", patient was advised to report to the ED for further evaluation. Patient states she went to [facility] ED. Per patient her troponin's and further cardiac work up were negative and was told that she may need a stress test and to follow up with cardiology. Advised patient that if chest pain is worsening or if she develops other symptoms she should report back to the ED. Patient states that she would like to see Dr. Stanford Breed for follow up appointment only. Advised patient that there were no openings for an Office Visit with Dr. Stanford Breed today or tomorrow. Offered patient an appointment with DOD/APP today but patient states that she is "not coming out today".   Patient has history of prior MI and Endocarditis.   Offered patient an appointment with DOD (Dr. Margaretann Loveless) tomorrow, placed patient on hold to look at schedule but patient hung up during that time.   Attempted to return call to patient to try and get her scheduled but unable to reach her. Left message for her to call back to office to schedule appointment.   Will forward message to MD to make him aware.

## 2020-08-21 NOTE — Telephone Encounter (Signed)
Pt c/o of Chest Pain: STAT if CP now or developed within 24 hours  1. Are you having CP right now? Yes.  2. Are you experiencing any other symptoms (ex. SOB, nausea, vomiting, sweating)? Vomiting yesterday.  3. How long have you been experiencing CP? Yesterday.  4. Is your CP continuous or coming and going? Continuous.  5. Have you taken Nitroglycerin? Yes, took one yesterday after she went to urgent care. Patient states that should would like to see Dr. Stanford Breed in the next few days. Please advise. ?

## 2020-08-21 NOTE — Telephone Encounter (Signed)
Agree with plan; arrange fuov with me or APP Kirk Ruths

## 2020-08-21 NOTE — Telephone Encounter (Signed)
Attempted to call the patient. No answer- I left a message to please call back.  

## 2020-08-22 NOTE — Telephone Encounter (Signed)
Spoke with pt, Follow up scheduled  

## 2020-08-30 ENCOUNTER — Other Ambulatory Visit: Payer: Self-pay | Admitting: Cardiology

## 2020-08-30 ENCOUNTER — Other Ambulatory Visit: Payer: Self-pay | Admitting: Osteopathic Medicine

## 2020-08-30 DIAGNOSIS — K219 Gastro-esophageal reflux disease without esophagitis: Secondary | ICD-10-CM

## 2020-09-01 ENCOUNTER — Other Ambulatory Visit: Payer: Self-pay

## 2020-09-01 ENCOUNTER — Encounter: Payer: Self-pay | Admitting: Cardiology

## 2020-09-01 ENCOUNTER — Ambulatory Visit (INDEPENDENT_AMBULATORY_CARE_PROVIDER_SITE_OTHER): Payer: Medicare Other | Admitting: Cardiology

## 2020-09-01 VITALS — BP 130/72 | HR 65 | Ht 63.0 in | Wt 174.0 lb

## 2020-09-01 DIAGNOSIS — R072 Precordial pain: Secondary | ICD-10-CM | POA: Diagnosis not present

## 2020-09-01 DIAGNOSIS — E7800 Pure hypercholesterolemia, unspecified: Secondary | ICD-10-CM

## 2020-09-01 DIAGNOSIS — I25119 Atherosclerotic heart disease of native coronary artery with unspecified angina pectoris: Secondary | ICD-10-CM | POA: Diagnosis not present

## 2020-09-01 DIAGNOSIS — I10 Essential (primary) hypertension: Secondary | ICD-10-CM | POA: Diagnosis not present

## 2020-09-01 NOTE — Patient Instructions (Signed)

## 2020-09-01 NOTE — [place] Notes (Signed)
HPI: FU CAD. Previously followed by Dr. Roslynn Amble. Apparently had myocardial infarction in 2005;cardiac catheterization revealed occluded infarct vessel which was treated medically. Not all records available. Echocardiogram July 2017 showed normal LV function, grade 1 diastolic dysfunction left atrial enlargement. Carotid Dopplers January 2020 showed no significant stenosis. Last nuclear study April2020 showed ejection fraction 80%. Moderate perfusion defect posterior lateral wall. Cannot exclude ischemia.. Patient treated medically. Seen in the emergency room at [facility] January 2022 with chest pain. Troponins were normal as well as hemoglobin. Chest x-ray without acute infiltrate.  Patient states that she was talking on the phone and became upset.  She developed substernal chest pain without radiation.  No change with nitroglycerin.  No dyspnea or diaphoresis but she did have nausea and vomiting.  The pain was persistent for 3-4 consecutive days without completely resolving.  Note prior to her event she did not have dyspnea on exertion or exertional chest pain.  Current Outpatient Medications  Medication Sig Dispense Refill  . AMBULATORY NON FORMULARY MEDICATION Knee-high, medium compression, graduated compression stockings. Apply to lower extremities. [email or web address], Zippered Compression Stockings, medium circ, long length 1 each 0  . ammonium lactate (LAC-HYDRIN) 12 % lotion APPLY TOPICALLY TO THE AFFECTED AREA AS NEEDED FOR DRY SKIN 400 g 0  . aspirin 81 MG chewable tablet Chew by mouth.    . blood glucose meter kit and supplies Dispense based on patient and insurance preference. Check daily. (FOR ICD-10 E10.9, E11.9). 1 each 0  . diclofenac sodium (VOLTAREN) 1 % GEL Apply 4 g topically 4 (four) times daily. To affected joint. 100 g 1  . Dulaglutide (TRULICITY) 1.5 JS/9.7WY SOPN Inject 0.5 mLs (1.5 mg total) into the skin once a week. 6 mL 5  . EPINEPHrine 0.3 mg/0.3 mL IJ SOAJ  injection Inject 1 each as directed as needed. 1 Device 1  . esomeprazole (NEXIUM) 20 MG capsule Take 1 capsule (20 mg total) by mouth daily. 90 capsule 0  . fluticasone (FLONASE) 50 MCG/ACT nasal spray Place 1 spray into both nostrils daily. 16 g 6  . isosorbide mononitrate (IMDUR) 60 MG 24 hr tablet Take 1 tablet (60 mg total) by mouth daily. 90 tablet 3  . Lancets (ONETOUCH ULTRASOFT) lancets Check fasting blood sugar daily and 2 hours after largest meal of the day. Dx DM. 100 each prn  . levothyroxine (SYNTHROID, LEVOTHROID) 100 MCG tablet 100 mg daily, except for 50 mg on Sunday  1  . lisinopril-hydrochlorothiazide (ZESTORETIC) 20-12.5 MG tablet Take 1 tablet by mouth daily. 90 tablet 3  . metFORMIN (GLUCOPHAGE) 1000 MG tablet TAKE 1 TABLET(1000 MG) BY MOUTH EVERY MORNING 90 tablet 0  . metoprolol succinate (TOPROL-XL) 100 MG 24 hr tablet Take 100 mg by mouth daily. Take with or immediately following a meal.    . nitroGLYCERIN (NITROSTAT) 0.4 MG SL tablet DISSOLVE 1 TABLET UNDER THE TONGUE EVERY 5 MINUTES, AS NEEDED FOR CHEST PAIN. MAX 3 TABLETS IN 15 MINUTES 25 tablet 0  . ONETOUCH DELICA LANCETS FINE MISC USE TO CHECK FASTING BLOOD SUGAR DAILY AND 2 HOURS AFTER LARGEST MEAL OF THE DAY. Dx E11.69 100 each 1  . ONETOUCH ULTRA test strip USE TO CHECK BLOOD SUGAR EVERY DAY AND 2 HOURS AFTER LARGEST MEAL 100 each 3  . rosuvastatin (CRESTOR) 40 MG tablet TAKE 1 TABLET(40 MG) BY MOUTH AT BEDTIME 90 tablet 1   No current facility-administered medications for this visit.     Past Medical History:  Diagnosis Date  . Aortic atherosclerosis ([place]) 06/10/2017  . CAD (coronary artery disease)   . Cancer ([place])   . Cardiomegaly 06/10/2017  . 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 Dermatochalasis of both eyelids 04/07/2017  . Diabetes mellitus without complication ([place])   . Diastolic heart failure, NYHA class 1 ([place])   . GERD (gastroesophageal reflux disease)   . Hypertension    . Lacunar infarction ([place]) 07/20/2018  . Left renal mass 10/04/2018   1.7 cm hypoechoic mass 10/04/2018  . Lumbar degenerative disc disease 03/09/2017  . Myocardial infarction ([place]) 2005  . Normocytic anemia 12/14/2018  . Papillary thyroid carcinoma ([place])   . Posterior vitreous detachment, right eye 04/07/2017  . Thyroid disease     Past Surgical History:  Procedure Laterality Date  . TOTAL THYROIDECTOMY  2016    Social History   Socioeconomic History  . Marital status: Single    Spouse name: Not on file  . Number of children: 1  . Years of education: Not on file  . Highest education level: Not on file  Occupational History  . Not on file  Tobacco Use  . Smoking status: Former Research scientist (life sciences)  . Smokeless tobacco: Never Used  Vaping Use  . Vaping Use: Never used  Substance and Sexual Activity  . Alcohol use: No  . Drug use: No  . Sexual activity: Not Currently  Other Topics Concern  . Not on file  Social History Narrative  . Not on file   Social Determinants of Health   Financial Resource Strain: Not on file  Food Insecurity: Not on file  Transportation Needs: Not on file  Physical Activity: Not on file  Stress: Not on file  Social Connections: Not on file  Intimate Partner Violence: Not on file    Family History  Problem Relation Age of Onset  . Hypertension Mother   . Hypertension Sister   . Hypertension Brother   . Breast cancer Daughter   . Hypertension Sister     ROS: no fevers or chills, productive cough, hemoptysis, dysphasia, odynophagia, melena, hematochezia, dysuria, hematuria, rash, seizure activity, orthopnea, PND, pedal edema, claudication. Remaining systems are negative.  Physical Exam: Well-developed well-nourished in no acute distress.  Skin is warm and dry.  HEENT is normal.  Neck is supple.  Chest is clear to auscultation with normal expansion.  Cardiovascular exam is regular rate and rhythm.  Abdominal exam nontender or distended. No masses  palpated. Extremities show no edema. neuro grossly intact  ECG-normal sinus rhythm at a rate of 65, no ST changes.  Personally reviewed  A/P  1 chest pain-symptoms are atypical and likely musculoskeletal.  They are reproduced with palpation today in the office.  Note her pain was persistent for 3-4 consecutive days and her enzymes were negative.  Electrocardiogram shows no ST changes.  We will not pursue further ischemia evaluation.  2 coronary artery disease-continue aspirin and statin.  3 hypertension-patient's blood pressure is controlled.  Continue present medications.  Check potassium and renal function.  4 hyperlipidemia-continue statin.  Check lipids and liver.  Kirk Ruths, MD

## 2020-09-02 ENCOUNTER — Encounter: Payer: Self-pay | Admitting: *Deleted

## 2020-09-02 ENCOUNTER — Other Ambulatory Visit: Payer: Self-pay

## 2020-09-02 LAB — LIPID PANEL
Chol/HDL Ratio: 2.1 ratio (ref 0.0–4.4)
Cholesterol, Total: 141 mg/dL (ref 100–199)
HDL: 66 mg/dL (ref >39)
LDL Chol Calc (NIH): 63 mg/dL (ref 0–99)
Triglycerides: 56 mg/dL (ref 0–149)
VLDL Cholesterol Cal: 12 mg/dL (ref 5–40)

## 2020-09-02 LAB — COMPREHENSIVE METABOLIC PANEL
ALT: 27 IU/L (ref 0–32)
AST: 23 IU/L (ref 0–40)
Albumin/Globulin Ratio: 1.5 (ref 1.2–2.2)
Albumin: 4.1 g/dL (ref 3.8–4.8)
Alkaline Phosphatase: 77 IU/L (ref 44–121)
BUN/Creatinine Ratio: 14 (ref 12–28)
BUN: 13 mg/dL (ref 8–27)
Bilirubin Total: 0.3 mg/dL (ref 0.0–1.2)
CO2: 27 mmol/L (ref 20–29)
Calcium: 9.7 mg/dL (ref 8.7–10.3)
Chloride: 102 mmol/L (ref 96–106)
Creatinine, Ser: 0.96 mg/dL (ref 0.57–1.00)
GFR calc Af Amer: 69 mL/min/{1.73_m2} (ref >59)
GFR calc non Af Amer: 60 mL/min/{1.73_m2} (ref >59)
Globulin, Total: 2.7 g/dL (ref 1.5–4.5)
Glucose: 92 mg/dL (ref 65–99)
Potassium: 4.6 mmol/L (ref 3.5–5.2)
Sodium: 142 mmol/L (ref 134–144)
Total Protein: 6.8 g/dL (ref 6.0–8.5)

## 2020-09-02 MED ORDER — METFORMIN HCL 1000 MG PO TABS: ORAL_TABLET | ORAL | 0 refills | Status: DC

## 2020-10-27 ENCOUNTER — Inpatient Hospital Stay: Admission: RE | Admit: 2020-10-27 | Payer: Medicare Other | Source: Ambulatory Visit

## 2020-11-05 ENCOUNTER — Other Ambulatory Visit: Payer: Self-pay | Admitting: Cardiology

## 2020-11-29 ENCOUNTER — Other Ambulatory Visit: Payer: Self-pay | Admitting: Osteopathic Medicine

## 2020-11-29 DIAGNOSIS — E1169 Type 2 diabetes mellitus with other specified complication: Secondary | ICD-10-CM
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
## 2021-03-23 ENCOUNTER — Other Ambulatory Visit: Payer: Self-pay

## 2021-03-23 ENCOUNTER — Ambulatory Visit (INDEPENDENT_AMBULATORY_CARE_PROVIDER_SITE_OTHER): Payer: Medicare Other | Admitting: Cardiology

## 2021-03-23 ENCOUNTER — Encounter: Payer: Self-pay | Admitting: Cardiology

## 2021-03-23 VITALS — BP 80/50 | HR 72 | Ht 63.0 in | Wt 170.1 lb

## 2021-03-23 DIAGNOSIS — I10 Essential (primary) hypertension: Secondary | ICD-10-CM | POA: Diagnosis not present

## 2021-03-23 DIAGNOSIS — I25119 Atherosclerotic heart disease of native coronary artery with unspecified angina pectoris: Secondary | ICD-10-CM

## 2021-03-23 DIAGNOSIS — E7800 Pure hypercholesterolemia, unspecified: Secondary | ICD-10-CM | POA: Diagnosis not present

## 2021-03-23 MED ORDER — ISOSORBIDE MONONITRATE ER 30 MG PO TB24: 30.0000 mg | ORAL_TABLET | Freq: Every day | ORAL | 3 refills | Status: DC

## 2021-03-23 MED ORDER — METOPROLOL SUCCINATE ER 50 MG PO TB24: 50.0000 mg | ORAL_TABLET | Freq: Every day | ORAL | 3 refills | Status: DC

## 2021-03-23 NOTE — Patient Instructions (Signed)
Medication Instructions:   DO NOT TAKE LISINOPRIL/HCT, ISOSORBIDE OR METOPROLOL TODAY  TOMORROW START METOPROLOL 50 MG ONCE DAILY= 1/2 OF THE 100 MG TABLET ONCE DAILY  START ISOSORBIDE 30 MG ONCE DAILY= 1/2 OF THE 60 MG TABLET ONCE DAILY  *If you need a refill on your cardiac medications before your next appointment, please call your pharmacy*   Follow-Up: At [facility], you and your health needs are our priority.  As part of our continuing mission to provide you with exceptional heart care, we have created designated Provider Care Teams.  These Care Teams include your primary Cardiologist (physician) and Advanced Practice Providers (APPs -  Physician Assistants and Nurse Practitioners) who all work together to provide you with the care you need, when you need it.  We recommend signing up for the patient portal called "MyChart".  Sign up information is provided on this After Visit Summary.  MyChart is used to connect with patients for Virtual Visits (Telemedicine).  Patients are able to view lab/test results, encounter notes, upcoming appointments, etc.  Non-urgent messages can be sent to your provider as well.   To learn more about what you can do with MyChart, go to [email or web address].    Your next appointment:   6 month(s)  The format for your next appointment:   In Person  Provider:   Kirk Ruths, MD

## 2021-03-25 ENCOUNTER — Other Ambulatory Visit: Payer: Self-pay

## 2021-03-25 ENCOUNTER — Other Ambulatory Visit (HOSPITAL_COMMUNITY)
Admission: RE | Admit: 2021-03-25 | Discharge: 2021-03-25 | Disposition: A | Discharge status: Home / Self Care | Payer: Medicare Other | Source: Ambulatory Visit | Attending: Medical-Surgical | Admitting: Medical-Surgical

## 2021-03-25 ENCOUNTER — Encounter: Payer: Self-pay | Admitting: Medical-Surgical

## 2021-03-25 ENCOUNTER — Ambulatory Visit (INDEPENDENT_AMBULATORY_CARE_PROVIDER_SITE_OTHER): Payer: Medicare Other | Admitting: Medical-Surgical

## 2021-03-25 VITALS — BP 94/59 | HR 72 | Temp 98.7°F | Ht 61.75 in | Wt 168.4 lb

## 2021-03-25 DIAGNOSIS — Z1151 Encounter for screening for human papillomavirus (HPV): Secondary | ICD-10-CM | POA: Insufficient documentation

## 2021-03-25 DIAGNOSIS — Z0000 Encounter for general adult medical examination without abnormal findings: Secondary | ICD-10-CM

## 2021-03-25 DIAGNOSIS — E785 Hyperlipidemia, unspecified: Secondary | ICD-10-CM | POA: Diagnosis not present

## 2021-03-25 DIAGNOSIS — E119 Type 2 diabetes mellitus without complications: Secondary | ICD-10-CM

## 2021-03-25 DIAGNOSIS — E1169 Type 2 diabetes mellitus with other specified complication: Secondary | ICD-10-CM

## 2021-03-25 DIAGNOSIS — Z114 Encounter for screening for human immunodeficiency virus [HIV]: Secondary | ICD-10-CM

## 2021-03-25 DIAGNOSIS — Z124 Encounter for screening for malignant neoplasm of cervix: Secondary | ICD-10-CM | POA: Insufficient documentation

## 2021-03-25 DIAGNOSIS — E890 Postprocedural hypothyroidism: Secondary | ICD-10-CM

## 2021-03-25 DIAGNOSIS — I10 Essential (primary) hypertension: Secondary | ICD-10-CM

## 2021-03-25 LAB — POCT UA - MICROALBUMIN
Creatinine, POC: 200 mg/dL
Microalbumin Ur, POC: 150 mg/L

## 2021-03-25 LAB — POCT GLYCOSYLATED HEMOGLOBIN (HGB A1C): Hemoglobin A1C: 6.8 % — AB (ref 4.0–5.6)

## 2021-03-25 NOTE — Progress Notes (Signed)
HPI: Sara Gardner is a 70 y.o. female who  has a past medical history of Aortic atherosclerosis ([place]) (06/10/2017), CAD (coronary artery disease), Cancer ([place]), Cardiomegaly (06/10/2017), Combined form of age-related cataract, both eyes (04/07/2017), Dermatochalasis of both eyelids (04/07/2017), Diabetes mellitus without complication ([place]), Diastolic heart failure, NYHA class 1 ([place]), GERD (gastroesophageal reflux disease), Hypertension, Lacunar infarction ([place]) (07/20/2018), Left renal mass (10/04/2018), Lumbar degenerative disc disease (03/09/2017), Myocardial infarction ([place]) (2005), Normocytic anemia (12/14/2018), Papillary thyroid carcinoma ([place]), Posterior vitreous detachment, right eye (04/07/2017), and Thyroid disease.  she presents to [facility], 03/25/21,  for chief complaint of: Annual physical exam  Dentist: Every 69-monthvisits, no concerns today. Eye exam: Up-to-date, has an appointment coming up for reevaluation, wears glasses Exercise: Exercises regularly with activity several times weekly Diet: Has been working on limiting sweets to moderation rather than excess Pap smear: doing today Mammogram: Up-to-date Colon cancer screening: Up-to-date COVID vaccine: Done  Concerns: None  Past medical, surgical, social and family history reviewed:  Patient Active Problem List   Diagnosis Date Noted   Atherosclerosis 03/19/2020   CHF (congestive heart failure) ([place] 11/06/2019   Other specified disorders of kidney and ureter 04/05/2019   Renal insufficiency 04/05/2019   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/05/2019   Cervicalgia 04/05/2019   Eustachian tube dysfunction, bilateral 04/05/2019   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[place] 04/05/2019   White matter abnormality on MRI of brain 04/05/2019   Elevated creatine kinase 12/14/2018   Myalgia due to statin 12/14/2018   Normocytic anemia 12/14/2018   Acute reaction to situational stress 11/30/2018    At moderate risk for fall 10/03/2018   Bilateral renal cysts 10/03/2018   Facet arthropathy, lumbosacral 10/03/2018   Microscopic hematuria 10/03/2018   Lacunar infarction ([place] 07/20/2018   Craniofacial pain 07/04/2018   Proteinuria 03/21/2018   Hematuria 03/21/2018   Controlled type 2 diabetes mellitus without complication, without long-term current use of insulin ([place] 03/08/2018   Encounter for weight management 01/06/2018   Urge incontinence 08/23/2017   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/23/2017   Urinary frequency 08/17/2017   Overactive bladder 08/17/2017   Equinus contracture of ankle 07/27/2017   Hammer toes of both feet 07/27/2017   Dermatophytosis, nail 07/27/2017   Calcaneal spur of both feet 07/27/2017   Dysuria 07/07/2017   Atypical chest pain 06/10/2017   Aortic atherosclerosis ([place] 06/10/2017   Cardiomegaly 06/10/2017   Vitreous detachment of right eye 05/12/2017   Combined form of age-related cataract, both eyes 04/07/2017   Dermatochalasis of both eyelids 04/07/2017   Posterior vitreous detachment, right eye 04/07/2017   Acute left-sided low back pain without sciatica 03/09/2017   Postural kyphosis of cervicothoracic region 03/09/2017   Lumbar degenerative disc disease 03/09/2017   Primary osteoarthritis of left knee 03/04/2017   Chronic pain of left knee 03/04/2017   Bilateral primary osteoarthritis of knee 12/15/2016   Bilateral lower extremity edema 12/15/2016   Allergy to honey bee venom 12/15/2016   Essential hypertension 02/16/2016   DM type 2 with diabetic dyslipidemia ([place] 02/10/2016   Gastroesophageal reflux disease without esophagitis 02/10/2016   Breast pain, left 12/30/2015   Hypothyroidism, 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   Diabetic sensorimotor polyneuropathy ([place] 10/27/2015   Papillary thyroid carcinoma ([place] 08/09/2014   Post-menopausal atrophic vaginitis 06/24/2014   Left thyroid nodule 01/29/2014   Nodule of left lung  01/21/2014   Class 1 obesity due to excess calories with serious comorbidity and body mass index (BMI) of 30.0 to 30.9  in adult 06/12/2012   Palpitations 11/29/2011   Abnormal mammogram of right breast 08/30/2011   Arthritis of left knee 05/24/2011   Coronary artery disease involving native coronary artery of native heart with angina pectoris ([place]) 05/24/2011   History of acute myocardial infarction of inferior wall 05/24/2011    Past Surgical History:  Procedure Laterality Date   TOTAL THYROIDECTOMY  2016    Social History   Tobacco Use   Smoking status: Former   Smokeless tobacco: Never  Substance Use Topics   Alcohol use: No    Family History  Problem Relation Age of Onset   Hypertension Mother    Hypertension Sister    Hypertension Brother    Breast cancer Daughter    Hypertension Sister      Current medication list and allergy/intolerance information reviewed:    Current Outpatient Medications  Medication Sig Dispense Refill   AMBULATORY NON FORMULARY MEDICATION Knee-high, medium compression, graduated compression stockings. Apply to lower extremities. [email or web address], Zippered Compression Stockings, medium circ, long length 1 each 0   ammonium lactate (LAC-HYDRIN) 12 % lotion APPLY TOPICALLY TO THE AFFECTED AREA AS NEEDED FOR DRY SKIN 400 g 0   aspirin 81 MG chewable tablet Chew by mouth.     blood glucose meter kit and supplies Dispense based on patient and insurance preference. Check daily. (FOR ICD-10 E10.9, E11.9). 1 each 0   Cholecalciferol 125 MCG (5000 UT) TABS Take by mouth.     diclofenac sodium (VOLTAREN) 1 % GEL Apply 4 g topically 4 (four) times daily. To affected joint. 100 g 1   Dulaglutide (TRULICITY) 1.5 YQ/6.5HQ SOPN Inject 0.5 mLs (1.5 mg total) into the skin once a week. 6 mL 5   EPINEPHrine 0.3 mg/0.3 mL IJ SOAJ injection Inject 1 each as directed as needed. 1 Device 1   esomeprazole (NEXIUM) 20 MG capsule TAKE 1 CAPSULE(20 MG) BY MOUTH  DAILY 90 capsule 1   fluticasone (FLONASE) 50 MCG/ACT nasal spray Place 1 spray into both nostrils daily. 16 g 6   isosorbide mononitrate (IMDUR) 30 MG 24 hr tablet Take 1 tablet (30 mg total) by mouth daily. 90 tablet 3   Lancets (ONETOUCH ULTRASOFT) lancets Check fasting blood sugar daily and 2 hours after largest meal of the day. Dx DM. 100 each prn   levothyroxine (SYNTHROID, LEVOTHROID) 100 MCG tablet 100 mg daily, except for 50 mg on Sunday  1   metFORMIN (GLUCOPHAGE) 1000 MG tablet TAKE 1 TABLET(1000 MG) BY MOUTH EVERY MORNING 90 tablet 0   metoprolol succinate (TOPROL-XL) 50 MG 24 hr tablet Take 1 tablet (50 mg total) by mouth daily. Take with or immediately following a meal. 90 tablet 3   nitroGLYCERIN (NITROSTAT) 0.4 MG SL tablet DISSOLVE 1 TABLET UNDER THE TONGUE EVERY 5 MINUTES, AS NEEDED FOR CHEST PAIN. MAX 3 TABLETS IN 15 MINUTES 25 tablet 0   ONETOUCH DELICA LANCETS FINE MISC USE TO CHECK FASTING BLOOD SUGAR DAILY AND 2 HOURS AFTER LARGEST MEAL OF THE DAY. Dx E11.69 100 each 1   ONETOUCH ULTRA test strip USE TO CHECK BLOOD SUGAR EVERY DAY AND 2 HOURS AFTER LARGEST MEAL 100 each 3   rosuvastatin (CRESTOR) 40 MG tablet TAKE 1 TABLET(40 MG) BY MOUTH AT BEDTIME 90 tablet 0   No current facility-administered medications for this visit.    Allergies  Allergen Reactions   Bee Venom Swelling   Tuberculin Tests Other (See Comments)   Tape Rash      Review  of Systems: Constitutional:  No  fever, no chills, No recent illness, No unintentional weight changes. No significant fatigue.  HEENT: No  headache, no vision change, no hearing change, No sore throat, No  sinus pressure, + periorbital swelling  Cardiac: No  chest pain, No  pressure, No palpitations, No  Orthopnea Respiratory:  No  shortness of breath. No  Cough Gastrointestinal: No  abdominal pain, No  nausea, No  vomiting,  No  blood in stool, No  diarrhea, No  constipation  Musculoskeletal: No new myalgia/arthralgia Skin: No   Rash, No other wounds/concerning lesions Genitourinary: No  incontinence, No  abnormal genital bleeding, No abnormal genital discharge Hem/Onc: No  easy bruising/bleeding, No  abnormal lymph node Endocrine: No cold intolerance,  No heat intolerance. No polyuria/polydipsia/polyphagia  Neurologic: No  weakness, No  dizziness, No  slurred speech/focal weakness/facial droop Psychiatric: No  concerns with depression, No  concerns with anxiety, No sleep problems, No mood problems  Exam:  BP (!) 94/59   Pulse 72   Temp 98.7 F (37.1 C)   Ht 5' 1.75" (1.568 m)   Wt 168 lb 6.4 oz (76.4 kg)   SpO2 98%   BMI 31.05 kg/m  Constitutional: VS see above. General Appearance: alert, well-developed, well-nourished, NAD Eyes: Normal lids and conjunctive, non-icteric sclera Ears, Nose, Mouth, Throat: MMM, Normal external inspection ears/nares/mouth/lips/gums. TM normal bilaterally.  Neck: No masses, trachea midline. No thyroid enlargement. No tenderness/mass appreciated. No lymphadenopathy Respiratory: Normal respiratory effort. no wheeze, no rhonchi, no rales Cardiovascular: S1/S2 normal, no murmur, no rub/gallop auscultated. RRR. No lower extremity edema. Pedal pulse II/IV bilaterally DP and PT. No carotid bruit or JVD. No abdominal aortic bruit. Gastrointestinal: Nontender, no masses. No hepatomegaly, no splenomegaly. No hernia appreciated. Bowel sounds normal. Rectal exam deferred.  Musculoskeletal: Gait normal. No clubbing/cyanosis of digits.  Neurological: Normal balance/coordination. No tremor. No cranial nerve deficit on limited exam. Motor and sensation intact and symmetric. Cerebellar reflexes intact.  Skin: warm, dry, intact. No rash/ulcer. No concerning nevi or subq nodules on limited exam.   Psychiatric: Normal judgment/insight. Normal mood and affect. Oriented x3.  Pelvic exam: normal external genitalia, vulva, vagina, cervix, uterus and adnexa, PAP: Pap smear done today, HPV test, exam  chaperoned by Glennie Hawk, LPN, CMA.    ASSESSMENT/PLAN:   1. Annual physical exam Checking CBC with differential, CMP, and lipid panel today.  2. Controlled type 2 diabetes mellitus with diabetic dyslipidemia, without long-term current use of insulin (HCC) POCT hemoglobin A1c completed at 6.8%.  Continue Trulicity 1.5 mg weekly and metformin 1000 mg twice daily.  Urine microalbumin abnormal.  3. Essential hypertension Blood pressure a little on the low side today so monitor at home and if becoming symptomatic, we may need to reevaluate.  4. Hypothyroidism, postsurgical Checking TSH today.  5. Screening for HIV (human immunodeficiency virus) Discuss screening recommendations.  Patient is agreeable so adding to blood work today.  Orders Placed This Encounter  Procedures   CBC with Differential/Platelet   COMPLETE METABOLIC PANEL WITH GFR   Lipid panel   TSH   HIV Antibody (routine testing w rflx)   POCT glycosylated hemoglobin (Hb A1C)   POCT UA - Microalbumin    No orders of the defined types were placed in this encounter.   Patient Instructions  Preventive Care 29 Years and Older, Female Preventive care refers to lifestyle choices and visits with your health care provider that can promote health and wellness. This includes: A yearly physical  exam. This is also called an annual wellness visit. Regular dental and eye exams. Immunizations. Screening for certain conditions. Healthy lifestyle choices, such as: Eating a healthy diet. Getting regular exercise. Not using drugs or products that contain nicotine and tobacco. Limiting alcohol use. What can I expect for my preventive care visit? Physical exam Your health care provider will check your: Height and weight. These may be used to calculate your BMI (body mass index). BMI is a measurement that tells if you are at a healthy weight. Heart rate and blood pressure. Body temperature. Skin for abnormal  spots. Counseling Your health care provider may ask you questions about your: Past medical problems. Family's medical history. Alcohol, tobacco, and drug use. Emotional well-being. Home life and relationship well-being. Sexual activity. Diet, exercise, and sleep habits. History of falls. Memory and ability to understand (cognition). Work and work Statistician. Pregnancy and menstrual history. Access to firearms. What immunizations do I need? Vaccines are usually given at various ages, according to a schedule. Your health care provider will recommend vaccines for you based on your age, medical history, and lifestyle or other factors, such as travel or where you work. What tests do I need? Blood tests Lipid and cholesterol levels. These may be checked every 5 years, or more often depending on your overall health. Hepatitis C test. Hepatitis B test. Screening Lung cancer screening. You may have this screening every year starting at age 67 if you have a 30-pack-year history of smoking and currently smoke or have quit within the past 15 years. Colorectal cancer screening. All adults should have this screening starting at age 72 and continuing until age 50. Your health care provider may recommend screening at age 37 if you are at increased risk. You will have tests every 1-10 years, depending on your results and the type of screening test. Diabetes screening. This is done by checking your blood sugar (glucose) after you have not eaten for a while (fasting). You may have this done every 1-3 years. Mammogram. This may be done every 1-2 years. Talk with your health care provider about how often you should have regular mammograms. Abdominal aortic aneurysm (AAA) screening. You may need this if you are a current or former smoker. BRCA-related cancer screening. This may be done if you have a family history of breast, ovarian, tubal, or peritoneal cancers. Other tests STD (sexually transmitted  disease) testing, if you are at risk. Bone density scan. This is done to screen for osteoporosis. You may have this done starting at age 80. Talk with your health care provider about your test results, treatment options, and if necessary, the need for more tests. Follow these instructions at home: Eating and drinking  Eat a diet that includes fresh fruits and vegetables, whole grains, lean protein, and low-fat dairy products. Limit your intake of foods with high amounts of sugar, saturated fats, and salt. Take vitamin and mineral supplements as recommended by your health care provider. Do not drink alcohol if your health care provider tells you not to drink. If you drink alcohol: Limit how much you have to 0-1 drink a day. Be aware of how much alcohol is in your drink. In the [place] one drink equals one 12 oz bottle of beer (355 mL), one 5 oz glass of wine (148 mL), or one 1 oz glass of hard liquor (44 mL). Lifestyle Take daily care of your teeth and gums. Brush your teeth every morning and night with fluoride toothpaste. Floss one time  each day. Stay active. Exercise for at least 30 minutes 5 or more days each week. Do not use any products that contain nicotine or tobacco, such as cigarettes, e-cigarettes, and chewing tobacco. If you need help quitting, ask your health care provider. Do not use drugs. If you are sexually active, practice safe sex. Use a condom or other form of protection in order to prevent STIs (sexually transmitted infections). Talk with your health care provider about taking a low-dose aspirin or statin. Find healthy ways to cope with stress, such as: Meditation, yoga, or listening to music. Journaling. Talking to a trusted person. Spending time with friends and family. Safety Always wear your seat belt while driving or riding in a vehicle. Do not drive: If you have been drinking alcohol. Do not ride with someone who has been drinking. When you are tired or  distracted. While texting. Wear a helmet and other protective equipment during sports activities. If you have firearms in your house, make sure you follow all gun safety procedures. What's next? Visit your health care provider once a year for an annual wellness visit. Ask your health care provider how often you should have your eyes and teeth checked. Stay up to date on all vaccines. This information is not intended to replace advice given to you by your health care provider. Make sure you discuss any questions you have with your health care provider. Document Revised: 09/19/2020 Document Reviewed: 07/06/2018 Elsevier Patient Education  2022 [employer].  Follow-up plan: Return for discussion of health concerns at your convenience.  Clearnce Sorrel, DNP, APRN, FNP-BC [facility]

## 2021-03-25 NOTE — Patient Instructions (Signed)
Preventive Care 40 Years and Older, Female Preventive care refers to lifestyle choices and visits with your health care provider that can promote health and wellness. This includes: A yearly physical exam. This is also called an annual wellness visit. Regular dental and eye exams. Immunizations. Screening for certain conditions. Healthy lifestyle choices, such as: Eating a healthy diet. Getting regular exercise. Not using drugs or products that contain nicotine and tobacco. Limiting alcohol use. What can I expect for my preventive care visit? Physical exam Your health care provider will check your: Height and weight. These may be used to calculate your BMI (body mass index). BMI is a measurement that tells if you are at a healthy weight. Heart rate and blood pressure. Body temperature. Skin for abnormal spots. Counseling Your health care provider may ask you questions about your: Past medical problems. Family's medical history. Alcohol, tobacco, and drug use. Emotional well-being. Home life and relationship well-being. Sexual activity. Diet, exercise, and sleep habits. History of falls. Memory and ability to understand (cognition). Work and work Statistician. Pregnancy and menstrual history. Access to firearms. What immunizations do I need? Vaccines are usually given at various ages, according to a schedule. Your health care provider will recommend vaccines for you based on your age, medical history, and lifestyle or other factors, such as travel or where you work. What tests do I need? Blood tests Lipid and cholesterol levels. These may be checked every 5 years, or more often depending on your overall health. Hepatitis C test. Hepatitis B test. Screening Lung cancer screening. You may have this screening every year starting at age 71 if you have a 30-pack-year history of smoking and currently smoke or have quit within the past 15 years. Colorectal cancer screening. All  adults should have this screening starting at age 71 and continuing until age 9. Your health care provider may recommend screening at age 31 if you are at increased risk. You will have tests every 1-10 years, depending on your results and the type of screening test. Diabetes screening. This is done by checking your blood sugar (glucose) after you have not eaten for a while (fasting). You may have this done every 1-3 years. Mammogram. This may be done every 1-2 years. Talk with your health care provider about how often you should have regular mammograms. Abdominal aortic aneurysm (AAA) screening. You may need this if you are a current or former smoker. BRCA-related cancer screening. This may be done if you have a family history of breast, ovarian, tubal, or peritoneal cancers. Other tests STD (sexually transmitted disease) testing, if you are at risk. Bone density scan. This is done to screen for osteoporosis. You may have this done starting at age 71. Talk with your health care provider about your test results, treatment options, and if necessary, the need for more tests. Follow these instructions at home: Eating and drinking  Eat a diet that includes fresh fruits and vegetables, whole grains, lean protein, and low-fat dairy products. Limit your intake of foods with high amounts of sugar, saturated fats, and salt. Take vitamin and mineral supplements as recommended by your health care provider. Do not drink alcohol if your health care provider tells you not to drink. If you drink alcohol: Limit how much you have to 0-1 drink a day. Be aware of how much alcohol is in your drink. In the [place] one drink equals one 12 oz bottle of beer (355 mL), one 5 oz glass of wine (148 mL), or one  1 oz glass of hard liquor (44 mL). Lifestyle Take daily care of your teeth and gums. Brush your teeth every morning and night with fluoride toothpaste. Floss one time each day. Stay active. Exercise for at least  30 minutes 5 or more days each week. Do not use any products that contain nicotine or tobacco, such as cigarettes, e-cigarettes, and chewing tobacco. If you need help quitting, ask your health care provider. Do not use drugs. If you are sexually active, practice safe sex. Use a condom or other form of protection in order to prevent STIs (sexually transmitted infections). Talk with your health care provider about taking a low-dose aspirin or statin. Find healthy ways to cope with stress, such as: Meditation, yoga, or listening to music. Journaling. Talking to a trusted person. Spending time with friends and family. Safety Always wear your seat belt while driving or riding in a vehicle. Do not drive: If you have been drinking alcohol. Do not ride with someone who has been drinking. When you are tired or distracted. While texting. Wear a helmet and other protective equipment during sports activities. If you have firearms in your house, make sure you follow all gun safety procedures. What's next? Visit your health care provider once a year for an annual wellness visit. Ask your health care provider how often you should have your eyes and teeth checked. Stay up to date on all vaccines. This information is not intended to replace advice given to you by your health care provider. Make sure you discuss any questions you have with your health care provider. Document Revised: 09/19/2020 Document Reviewed: 07/06/2018 Elsevier Patient Education  2022 [employer].

## 2021-03-26 LAB — CYTOLOGY - PAP
Adequacy: ABSENT
Comment: NEGATIVE
Diagnosis: NEGATIVE
High risk HPV: NEGATIVE

## 2021-04-13 ENCOUNTER — Telehealth: Payer: Self-pay

## 2021-04-13 NOTE — Telephone Encounter (Signed)
Medication: Dulaglutide (TRULICITY) 1.5 0000000 SOPN Prior authorization submitted via CoverMyMeds on 04/13/2021 [place] submission pending

## 2021-04-21 NOTE — Telephone Encounter (Signed)
Medication: Dulaglutide (TRULICITY) 1.5 CV/8.1MM SOPN Prior authorization determination received Medication has been denied Reason for denial: "Your plan limit is 4 pens (2 mL) per 28 days."

## 2021-05-20 ENCOUNTER — Ambulatory Visit (INDEPENDENT_AMBULATORY_CARE_PROVIDER_SITE_OTHER): Payer: Medicare Other | Admitting: Physician Assistant

## 2021-05-20 ENCOUNTER — Other Ambulatory Visit: Payer: Self-pay

## 2021-05-20 ENCOUNTER — Encounter: Payer: Self-pay | Admitting: Physician Assistant

## 2021-05-20 VITALS — BP 91/62 | HR 75 | Ht 61.75 in | Wt 166.0 lb

## 2021-05-20 DIAGNOSIS — M542 Cervicalgia: Secondary | ICD-10-CM

## 2021-05-20 DIAGNOSIS — N644 Mastodynia: Secondary | ICD-10-CM | POA: Diagnosis not present

## 2021-05-20 DIAGNOSIS — I959 Hypotension, unspecified: Secondary | ICD-10-CM

## 2021-05-20 NOTE — Progress Notes (Signed)
Subjective:    Patient ID: Sara Gardner, female    DOB: Mar 17, 1951, 70 y.o.   MRN: [id number]  HPI Pt is a 70 yo femal with antherosclerosis, cardiolmegaly, Chf, GERd, T2DM, hx of lacunar infarction who presents to the clinic with neck pain and left breast pain.   She denies any CP, palpitations, headaches, dizziness, weakness or vision changes.   Left neck pain for the last week. No injury. No radiation down arm. No numbness or tingling. Not tried anything to make better.   Noticed left breast tenderness about 4 weeks ago. No masses. Last mammogram over a year ago. No nipple discharge or tenderness. No injury. No redness.   .. Active Ambulatory Problems    Diagnosis Date Noted   Abnormal mammogram of right breast 08/30/2011   Arthritis of left knee 05/24/2011   Breast pain, left 12/30/2015   Coronary artery disease involving native coronary artery of native heart with angina pectoris ([place]) 05/24/2011   DM type 2 with diabetic dyslipidemia ([place]) 02/10/2016   Diabetic sensorimotor polyneuropathy ([place]) 10/27/2015   Essential hypertension 02/16/2016   Gastroesophageal reflux disease without esophagitis 02/10/2016   History of acute myocardial infarction of inferior wall 05/24/2011   Left thyroid nodule 01/29/2014   Hypothyroidism, 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   Nodule of left lung 01/21/2014   Class 1 obesity due to excess calories with serious comorbidity and body mass index (BMI) of 30.0 to 30.9 in adult 06/12/2012   Palpitations 11/29/2011   Papillary thyroid carcinoma ([place]) 08/09/2014   Post-menopausal atrophic vaginitis 06/24/2014   Bilateral primary osteoarthritis of knee 12/15/2016   Bilateral lower extremity edema 12/15/2016   Allergy to honey bee venom 12/15/2016   Primary osteoarthritis of left knee 03/04/2017   Chronic pain of left knee 03/04/2017   Acute left-sided low back pain without sciatica 03/09/2017   Postural kyphosis of cervicothoracic region 03/09/2017    Lumbar degenerative disc disease 03/09/2017   Combined form of age-related cataract, both eyes 04/07/2017   Dermatochalasis of both eyelids 04/07/2017   Posterior vitreous detachment, right eye 04/07/2017   Vitreous detachment of right eye 05/12/2017   Atypical chest pain 06/10/2017   Aortic atherosclerosis ([place]) 06/10/2017   Cardiomegaly 06/10/2017   Dysuria 07/07/2017   Equinus contracture of ankle 07/27/2017   Hammer toes of both feet 07/27/2017   Dermatophytosis, nail 07/27/2017   Calcaneal spur of both feet 07/27/2017   Urinary frequency 08/17/2017   Overactive bladder 08/17/2017   Urge incontinence 08/23/2017   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/23/2017   Encounter for weight management 01/06/2018   Controlled type 2 diabetes mellitus without complication, without long-term current use of insulin ([place]) 03/08/2018   Proteinuria 03/21/2018   Hematuria 03/21/2018   Craniofacial pain 07/04/2018   Lacunar infarction ([place]) 07/20/2018   At moderate risk for fall 10/03/2018   Bilateral renal cysts 10/03/2018   Facet arthropathy, lumbosacral 10/03/2018   Microscopic hematuria 10/03/2018   Acute reaction to situational stress 11/30/2018   Elevated creatine kinase 12/14/2018   Myalgia due to statin 12/14/2018   Normocytic anemia 12/14/2018   Other specified disorders of kidney and ureter 04/05/2019   Renal insufficiency 04/05/2019   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/05/2019   Cervicalgia 04/05/2019   Eustachian tube dysfunction, bilateral 04/05/2019   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[place]) 04/05/2019   White matter abnormality on MRI of brain 04/05/2019   CHF (congestive heart failure) ([place]) 11/06/2019   Atherosclerosis 03/19/2020   Hypotension 05/26/2021   Neck pain 05/26/2021  Resolved Ambulatory Problems    Diagnosis Date Noted   Cyst of right kidney 07/09/2016   Acute right flank pain 03/07/2017   Complex renal cyst 10/03/2018   Left renal mass 10/04/2018    Past Medical History:  Diagnosis Date   CAD (coronary artery disease)    Cancer ([place])    Diabetes mellitus without complication ([place])    Diastolic heart failure, NYHA class 1 (HCC)    GERD (gastroesophageal reflux disease)    Hypertension    Myocardial infarction [facility]) 2005   Thyroid disease        Review of Systems See HPI.     Objective:   Physical Exam Vitals reviewed.  Constitutional:      Appearance: Normal appearance.  HENT:     Head: Normocephalic.  Neck:     Comments: Full ROM with some discomfort moving neck to the right.  Cardiovascular:     Rate and Rhythm: Normal rate and regular rhythm.     Pulses: Normal pulses.  Pulmonary:     Comments: Tenderness over left outer breast without masses.  Musculoskeletal:     Right lower leg: No edema.     Left lower leg: No edema.     Comments: No tenderness to palpation over cervical spine.  Tight paraspinal muscles.  NROM of bilateral shoulders.   Neurological:     General: No focal deficit present.     Mental Status: She is alert and oriented to person, place, and time.  Psychiatric:        Mood and Affect: Mood normal.          Assessment & Plan:  Marland KitchenMarland KitchenDavita was seen today for pain.  Diagnoses and all orders for this visit:  Neck pain  Hypotension, unspecified hypotension type  Breast pain, left  BP is low today. Pt is asymptomatic. Pt is on medications due to her complex CV hx and no easy medications to change or decrease. Will hold for now. Discussed symptoms of low BP. Make sure drinking enough water. Follow up as needed.   Last mammogram 10/26/2019. Will order mammogram.   Neck pain seems muscular.  Exercises given.  Use voltaren gel. Cannot take oral NSAIDs.  Placed icy hot patch on her today.  Consider massage and tens unit.  Follow up as needed.

## 2021-05-20 NOTE — Patient Instructions (Addendum)
Heat Tens unit(stim therapy) Icy hot patches   Cervical Strain and Sprain Rehab Ask your health care provider which exercises are safe for you. Do exercises exactly as told by your health care provider and adjust them as directed. It is normal to feel mild stretching, pulling, tightness, or discomfort as you do these exercises. Stop right away if you feel sudden pain or your pain gets worse. Do not begin these exercises until told by your health care provider. Stretching and range-of-motion exercises Cervical side bending  Using good posture, sit on a stable chair or stand up. Without moving your shoulders, slowly tilt your left / right ear to your shoulder until you feel a stretch in the opposite side neck muscles. You should be looking straight ahead. Hold for __________ seconds. Repeat with the other side of your neck. Repeat __________ times. Complete this exercise __________ times a day. Cervical rotation  Using good posture, sit on a stable chair or stand up. Slowly turn your head to the side as if you are looking over your left / right shoulder. Keep your eyes level with the ground. Stop when you feel a stretch along the side and the back of your neck. Hold for __________ seconds. Repeat this by turning to your other side. Repeat __________ times. Complete this exercise __________ times a day. Thoracic extension and pectoral stretch Roll a towel or a small blanket so it is about 4 inches (10 cm) in diameter. Lie down on your back on a firm surface. Put the towel lengthwise, under your spine in the middle of your back. It should not be under your shoulder blades. The towel should line up with your spine from your middle back to your lower back. Put your hands behind your head and let your elbows fall out to your sides. Hold for __________ seconds. Repeat __________ times. Complete this exercise __________ times a day. Strengthening exercises Isometric upper cervical flexion Lie  on your back with a thin pillow behind your head and a small rolled-up towel under your neck. Gently tuck your chin toward your chest and nod your head down to look toward your feet. Do not lift your head off the pillow. Hold for __________ seconds. Release the tension slowly. Relax your neck muscles completely before you repeat this exercise. Repeat __________ times. Complete this exercise __________ times a day. Isometric cervical extension  Stand about 6 inches (15 cm) away from a wall, with your back facing the wall. Place a soft object, about 6-8 inches (15-20 cm) in diameter, between the back of your head and the wall. A soft object could be a small pillow, a ball, or a folded towel. Gently tilt your head back and press into the soft object. Keep your jaw and forehead relaxed. Hold for __________ seconds. Release the tension slowly. Relax your neck muscles completely before you repeat this exercise. Repeat __________ times. Complete this exercise __________ times a day. Posture and body mechanics Body mechanics refers to the movements and positions of your body while you do your daily activities. Posture is part of body mechanics. Good posture and healthy body mechanics can help to relieve stress in your body's tissues and joints. Good posture means that your spine is in its natural S-curve position (your spine is neutral), your shoulders are pulled back slightly, and your head is not tipped forward. The following are general guidelines for applying improved posture and body mechanics to your everyday activities. Sitting  When sitting, keep your spine  neutral and keep your feet flat on the floor. Use a footrest, if necessary, and keep your thighs parallel to the floor. Avoid rounding your shoulders, and avoid tilting your head forward. When working at a desk or a computer, keep your desk at a height where your hands are slightly lower than your elbows. Slide your chair under your desk so you  are close enough to maintain good posture. When working at a computer, place your monitor at a height where you are looking straight ahead and you do not have to tilt your head forward or downward to look at the screen. Standing  When standing, keep your spine neutral and keep your feet about hip-width apart. Keep a slight bend in your knees. Your ears, shoulders, and hips should line up. When you do a task in which you stand in one place for a long time, place one foot up on a stable object that is 2-4 inches (5-10 cm) high, such as a footstool. This helps keep your spine neutral. Resting When lying down and resting, avoid positions that are most painful for you. Try to support your neck in a neutral position. You can use a contour pillow or a small rolled-up towel. Your pillow should support your neck but not push on it. This information is not intended to replace advice given to you by your health care provider. Make sure you discuss any questions you have with your health care provider. Document Revised: 11/01/2018 Document Reviewed: 04/12/2018 Elsevier Patient Education  [employer].

## 2021-05-26 DIAGNOSIS — I959 Hypotension, unspecified: Secondary | ICD-10-CM | POA: Insufficient documentation

## 2021-05-26 DIAGNOSIS — M542 Cervicalgia: Secondary | ICD-10-CM | POA: Insufficient documentation

## 2021-06-10 ENCOUNTER — Other Ambulatory Visit: Payer: Self-pay

## 2021-06-10 ENCOUNTER — Ambulatory Visit (INDEPENDENT_AMBULATORY_CARE_PROVIDER_SITE_OTHER): Payer: Medicare Other

## 2021-06-10 DIAGNOSIS — Z111 Encounter for screening for respiratory tuberculosis: Secondary | ICD-10-CM

## 2021-06-11 ENCOUNTER — Telehealth: Payer: Self-pay | Admitting: Medical-Surgical

## 2021-06-11 NOTE — Telephone Encounter (Signed)
Patient has picked up [facility] Health Exam Form.

## 2021-06-22 ENCOUNTER — Ambulatory Visit
Admission: RE | Admit: 2021-06-22 | Discharge: 2021-06-22 | Disposition: A | Discharge status: Home / Self Care | Payer: Medicare Other | Source: Ambulatory Visit | Attending: Physician Assistant | Admitting: Physician Assistant

## 2021-06-22 ENCOUNTER — Other Ambulatory Visit: Payer: Self-pay

## 2021-06-22 DIAGNOSIS — N644 Mastodynia: Secondary | ICD-10-CM

## 2021-06-23 NOTE — Progress Notes (Signed)
To PCP

## 2021-07-08 ENCOUNTER — Telehealth: Payer: Self-pay | Admitting: General Practice

## 2021-07-08 NOTE — Telephone Encounter (Signed)
Transition Care Management Unsuccessful Follow-up Telephone Call  Date of discharge and from where:  07/08/21 from [facility] (Patient left AMA)  Attempts:  1st Attempt  Reason for unsuccessful TCM follow-up call:  Left voice message

## 2021-07-10 NOTE — Telephone Encounter (Signed)
Transition Care Management Unsuccessful Follow-up Telephone Call  Date of discharge and from where:  07/08/21 from [facility] (Patient left AMA)  Attempts:  2nd Attempt  Reason for unsuccessful TCM follow-up call:  Left voice message

## 2021-07-13 NOTE — Telephone Encounter (Signed)
Transition Care Management Unsuccessful Follow-up Telephone Call  Date of discharge and from where:  07/08/21 from [facility] (Left AMA)  Attempts:  3rd Attempt  Reason for unsuccessful TCM follow-up call:  Left voice message

## 2021-07-24 ENCOUNTER — Ambulatory Visit (INDEPENDENT_AMBULATORY_CARE_PROVIDER_SITE_OTHER): Payer: Medicare Other | Admitting: Medical-Surgical

## 2021-07-24 ENCOUNTER — Ambulatory Visit (INDEPENDENT_AMBULATORY_CARE_PROVIDER_SITE_OTHER): Payer: Medicare Other

## 2021-07-24 ENCOUNTER — Other Ambulatory Visit: Payer: Self-pay

## 2021-07-24 ENCOUNTER — Encounter: Payer: Self-pay | Admitting: Medical-Surgical

## 2021-07-24 VITALS — BP 91/60 | HR 78 | Temp 98.6°F | Resp 18 | Ht 61.75 in | Wt 165.0 lb

## 2021-07-24 DIAGNOSIS — R202 Paresthesia of skin: Secondary | ICD-10-CM

## 2021-07-24 DIAGNOSIS — Z09 Encounter for follow-up examination after completed treatment for conditions other than malignant neoplasm: Secondary | ICD-10-CM

## 2021-07-24 DIAGNOSIS — R200 Anesthesia of skin: Secondary | ICD-10-CM

## 2021-07-24 DIAGNOSIS — M542 Cervicalgia: Secondary | ICD-10-CM | POA: Diagnosis not present

## 2021-07-24 MED ORDER — MELOXICAM 15 MG PO TABS: 15.0000 mg | ORAL_TABLET | Freq: Every day | ORAL | 0 refills | Status: DC

## 2021-07-24 NOTE — Progress Notes (Signed)
°  HPI with pertinent ROS:   CC: hospital follow up  HPI: Sara 70 year old Gardner presenting today for a hospital discharge follow up. She was recently seen in the ED for chest pain but ended up leaving AMA because she did not feel that they were taking her seriously. Today she reports she has had no further issues with chest pain. She does see Dr. Stanford Breed with cardiology and is compliant with her prescribed medications.   Reports that she has a generalized burning feeling all over her body, including her head. Thinks this is related to the ongoing stress with her neighbors. Doesn't take anything for the burning and doesn't think there is anything to be done about it. Notes that she has a court date in January regarding her neighbor troubles.   Has had intermittent numbness and tingling of both arms and hands. Not associated with any particular activity but notes that it happens a lot when she is just laying around doing nothing. No color or temperature changes. No weakness in her grips. Endorses some intermittent neck pain.   I reviewed the past medical history, family history, social history, surgical history, and allergies today and no changes were needed.  Please see the problem list section below in epic for further details.  Physical exam:   General: Well Developed, well nourished, and in no acute distress.  Neuro: Alert and oriented x3.  HEENT: Normocephalic, atraumatic.  Skin: Warm and dry. Cardiac: Regular rate and rhythm, no murmurs rubs or gallops, no lower extremity edema.  Respiratory: Clear to auscultation bilaterally. Not using accessory muscles, speaking in full sentences.  Impression and Recommendations:    1. Hospital discharge follow-up Reviewed available hospital records. Labs and EKG look reassuring. Her BP is low today but she reports it's been like this for a while. Recommend she contact Dr. Jacalyn Lefevre office regarding her BP regimen and recent chest pain issues to  get their take on things. Unclear etiology regarding the overall burning that she is talking about. Possibly related to a psychosomatic issue with all the stress she has been under. Patient wants no intervention for this so recommend monitoring and if this continues after her court situation is resolved, we can evaluate further.   2. Numbness and tingling in both hands 3. Neck pain + reverse Phalen's and Tinel's bilaterally to indicate carpal tunnel syndrome. Recommend bracing and topical Voltaren gel. Getting cervical spine x-rays today.  - DG Cervical Spine Complete; Future  Return in about 6 months (around 01/22/2022) for chronic disease follow up. ___________________________________________ Clearnce Sorrel, DNP, APRN, FNP-BC [facility] [place]

## 2021-07-29 ENCOUNTER — Other Ambulatory Visit: Payer: Self-pay

## 2021-07-29 DIAGNOSIS — R202 Paresthesia of skin: Secondary | ICD-10-CM

## 2021-08-04 ENCOUNTER — Ambulatory Visit: Payer: Commercial Managed Care - HMO | Admitting: Physical Therapy

## 2021-08-05 ENCOUNTER — Ambulatory Visit: Payer: Medicare Other | Attending: Medical-Surgical | Admitting: Physical Therapy

## 2021-08-05 ENCOUNTER — Other Ambulatory Visit: Payer: Self-pay

## 2021-08-05 ENCOUNTER — Encounter: Payer: Self-pay | Admitting: Physical Therapy

## 2021-08-05 DIAGNOSIS — R201 Hypoesthesia of skin: Secondary | ICD-10-CM | POA: Insufficient documentation

## 2021-08-05 DIAGNOSIS — R293 Abnormal posture: Secondary | ICD-10-CM | POA: Diagnosis not present

## 2021-08-05 DIAGNOSIS — M6281 Muscle weakness (generalized): Secondary | ICD-10-CM | POA: Diagnosis not present

## 2021-08-05 DIAGNOSIS — R202 Paresthesia of skin: Secondary | ICD-10-CM | POA: Diagnosis not present

## 2021-08-05 LAB — HM HEPATITIS C SCREENING LAB: HM Hepatitis Screen: NEGATIVE

## 2021-08-05 NOTE — Patient Instructions (Signed)
Access Code: [email or web address] URL: [email or web address] Date: 08/05/2021 Prepared by: Isabelle Course  Exercises Seated Upper Trapezius Stretch - 1 x daily - 7 x weekly - 1 sets - 3 reps - 20-30 seconds hold Gentle Levator Scapulae Stretch - 1 x daily - 7 x weekly - 1 sets - 3 reps - 20-30 seconds hold Seated Scapular Retraction - 1 x daily - 7 x weekly - 1 sets - 10 reps - 5 seconds hold

## 2021-08-05 NOTE — Therapy (Signed)
[place], [place], [address] Phone: [phone]   Fax:  [phone]  Physical Therapy Evaluation  Patient Details  Name: Sara Gardner MRN: [id number] Date of Birth: 08-29-1950 Referring Provider (PT): Samuel Bouche   Encounter Date: 08/05/2021   PT End of Session - 08/05/21 1225     Visit Number 1    Number of Visits 12    Date for PT Re-Evaluation 09/16/21    Authorization Type UHC    PT Start Time 4081    PT Stop Time 1226    PT Time Calculation (min) 41 min    Activity Tolerance Patient tolerated treatment well    Behavior During Therapy [facility] for tasks assessed/performed             Past Medical History:  Diagnosis Date   Aortic atherosclerosis ([place]) 06/10/2017   CAD (coronary artery disease)    Cancer ([place])    Cardiomegaly 06/10/2017   Combined form of age-related cataract, both eyes 04/07/2017   Dermatochalasis of both eyelids 04/07/2017   Diabetes mellitus without complication (HCC)    Diastolic heart failure, NYHA class 1 ([place])    GERD (gastroesophageal reflux disease)    Hypertension    Lacunar infarction ([place]) 07/20/2018   Left renal mass 10/04/2018   1.7 cm hypoechoic mass 10/04/2018   Lumbar degenerative disc disease 03/09/2017   Myocardial infarction ([place]) 2005   Normocytic anemia 12/14/2018   Papillary thyroid carcinoma ([place])    Posterior vitreous detachment, right eye 04/07/2017   Thyroid disease     Past Surgical History:  Procedure Laterality Date   TOTAL THYROIDECTOMY  2016    There were no vitals filed for this visit.    Subjective Assessment - 08/05/21 1146     Subjective Pt states that she has been having burning in bilat UEs and LEs since April. It was getting worse and she saw MD who recommended physical therapy and inti-inflammatory medication. Pt states she did not take meds because she doesn't like to take medication.    Pertinent History MI 2005     Diagnostic tests x ray of neck: cervical disc degeneration - moderate    Patient Stated Goals reduce tingling/burning    Currently in Pain? Yes    Pain Score 2     Pain Location Arm    Pain Orientation Left;Right    Pain Descriptors / Indicators Burning;Tingling    Pain Type Chronic pain    Pain Onset More than a month ago    Pain Frequency Constant    Aggravating Factors  being at home    Pain Relieving Factors leaving home                [facility] PT Assessment - 08/05/21 0001       Assessment   Medical Diagnosis parasthesias    Referring Provider (PT) Charna Archer, [name]    Onset Date/Surgical Date 10/30/20    Next MD Visit PRN      Precautions   Precautions None      Restrictions   Weight Bearing Restrictions No      Balance Screen   Has the patient fallen in the past 6 months No      Home Environment   Additional Comments pt lives alone      Prior Function   Level of Independence Independent      Observation/Other Assessments   Focus on Therapeutic Outcomes (FOTO)  not performed  Sensation   Light Touch --   decreased sensation in bilat hands     Posture/Postural Control   Posture Comments rounded shoulders      ROM / Strength   AROM / PROM / Strength AROM;Strength      AROM   AROM Assessment Site Cervical    Cervical Flexion 45    Cervical Extension 30    Cervical - Right Side Bend 25    Cervical - Left Side Bend 25    Cervical - Right Rotation 25    Cervical - Left Rotation 30      Strength   Overall Strength Comments Lt shoulder flex 3/5, abd 3/5, Rt shoulder flex 4+/5, abd 4+/5   Lt shoulder strength limited by pain     Palpation   Spinal mobility cervical hypomobility with lateral and PA glides    Palpation comment increased mm spasticity Lt > Rt uppert traps, bilat cervical paraspinals and suboccipitals      Special Tests   Other special tests spurling negative                        Objective measurements completed on  examination: See above findings.       [facility] Adult PT Treatment/Exercise - 08/05/21 0001       Exercises   Exercises Neck      Neck Exercises: Seated   Other Seated Exercise scap squeeze x 5      Neck Exercises: Stretches   Upper Trapezius Stretch 20 seconds    Levator Stretch 20 seconds      Modalities   Modalities Electrical Stimulation;Moist Heat      Moist Heat Therapy   Number Minutes Moist Heat 10 Minutes    Moist Heat Location Cervical      Electrical Stimulation   Electrical Stimulation Location cervical    Electrical Stimulation Action TENS    Electrical Stimulation Parameters to tolerance    Electrical Stimulation Goals Pain                     PT Education - 08/05/21 1211     Education Details PT POC and goals, HEP, TENS    Person(s) Educated Patient    Methods Explanation;Demonstration;Handout    Comprehension Returned demonstration;Verbalized understanding                 PT Long Term Goals - 08/05/21 1235       PT LONG TERM GOAL #1   Title Pt will be independent with HEP    Time 6    Period Weeks    Status New    Target Date 09/16/21      PT LONG TERM GOAL #2   Title Pt will improve bilat UE strength to 4+/5 to tolerate yard work with decreased pain    Time 6    Period Weeks    Status New    Target Date 09/16/21      PT LONG TERM GOAL #3   Title Pt will improve cervical sidebending and rotation ROM by 10 degrees to perform IADLs with decreased pain    Time 6    Period Weeks    Status New    Target Date 09/16/21                    Plan - 08/05/21 1226     Clinical Impression Statement Pt is a 71 y/o female referred for  parasthesias. Pt presents with decreased strength, ROM, mobility, increased pain and impaired posture. pt will benefit from skilled PT to address deficits and decrease pain and improve mobility.    Personal Factors and Comorbidities Past/Current Experience;Time since onset of  injury/illness/exacerbation    Examination-Activity Limitations Reach Overhead    Examination-Participation Restrictions Cleaning;Yard Work;Community Activity    Stability/Clinical Decision Making Evolving/Moderate complexity    Clinical Decision Making Moderate    Rehab Potential Good    PT Frequency 2x / week    PT Duration 6 weeks    PT Treatment/Interventions Taping;Dry needling;Manual techniques;Passive range of motion;Patient/family education;Therapeutic exercise;Therapeutic activities;Neuromuscular re-education;Traction;Moist Heat;Electrical Stimulation;Cryotherapy    PT Next Visit Plan assess HEP, cervical mobility, manual/modalties, postural strength    PT Home Exercise Plan [email or web address]    Consulted and Agree with Plan of Care Patient             Patient will benefit from skilled therapeutic intervention in order to improve the following deficits and impairments:  Pain, Postural dysfunction, Decreased strength, Decreased activity tolerance, Decreased range of motion, Hypomobility  Visit Diagnosis: Impaired sensation - Plan: PT plan of care cert/re-cert  Abnormal posture - Plan: PT plan of care cert/re-cert  Muscle weakness (generalized) - Plan: PT plan of care cert/re-cert     Problem List Patient Active Problem List   Diagnosis Date Noted   Hypotension 05/26/2021   Neck pain 05/26/2021   Atherosclerosis 03/19/2020   CHF (congestive heart failure) ([place]) 11/06/2019   Other specified disorders of kidney and ureter 04/05/2019   Renal insufficiency 04/05/2019   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/05/2019   Cervicalgia 04/05/2019   Eustachian tube dysfunction, bilateral 04/05/2019   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[place]) 04/05/2019   White matter abnormality on MRI of brain 04/05/2019   Elevated creatine kinase 12/14/2018   Myalgia due to statin 12/14/2018   Normocytic anemia 12/14/2018   Acute reaction to situational stress 11/30/2018   At moderate risk for fall 10/03/2018    Bilateral renal cysts 10/03/2018   Facet arthropathy, lumbosacral 10/03/2018   Microscopic hematuria 10/03/2018   Lacunar infarction ([place]) 07/20/2018   Craniofacial pain 07/04/2018   Proteinuria 03/21/2018   Hematuria 03/21/2018   Controlled type 2 diabetes mellitus without complication, without long-term current use of insulin ([place]) 03/08/2018   Encounter for weight management 01/06/2018   Urge incontinence 08/23/2017   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/23/2017   Urinary frequency 08/17/2017   Overactive bladder 08/17/2017   Equinus contracture of ankle 07/27/2017   Hammer toes of both feet 07/27/2017   Dermatophytosis, nail 07/27/2017   Calcaneal spur of both feet 07/27/2017   Dysuria 07/07/2017   Atypical chest pain 06/10/2017   Aortic atherosclerosis ([place]) 06/10/2017   Cardiomegaly 06/10/2017   Vitreous detachment of right eye 05/12/2017   Combined form of age-related cataract, both eyes 04/07/2017   Dermatochalasis of both eyelids 04/07/2017   Posterior vitreous detachment, right eye 04/07/2017   Acute left-sided low back pain without sciatica 03/09/2017   Postural kyphosis of cervicothoracic region 03/09/2017   Lumbar degenerative disc disease 03/09/2017   Primary osteoarthritis of left knee 03/04/2017   Chronic pain of left knee 03/04/2017   Bilateral primary osteoarthritis of knee 12/15/2016   Bilateral lower extremity edema 12/15/2016   Allergy to honey bee venom 12/15/2016   Essential hypertension 02/16/2016   DM type 2 with diabetic dyslipidemia ([place]) 02/10/2016   Gastroesophageal reflux disease without esophagitis 02/10/2016   Breast pain, left 12/30/2015   Hypothyroidism, 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  Diabetic sensorimotor polyneuropathy ([place]) 10/27/2015   Papillary thyroid carcinoma ([place]) 08/09/2014   Post-menopausal atrophic vaginitis 06/24/2014   Left thyroid nodule 01/29/2014   Nodule of left lung 01/21/2014   Class 1 obesity due to excess  calories with serious comorbidity and body mass index (BMI) of 30.0 to 30.9 in adult 06/12/2012   Palpitations 11/29/2011   Abnormal mammogram of right breast 08/30/2011   Arthritis of left knee 05/24/2011   Coronary artery disease involving native coronary artery of native heart with angina pectoris ([place]) 05/24/2011   History of acute myocardial infarction of inferior wall 05/24/2011    Chala Gul, PT 08/05/2021, 12:38 PM  [facility] [place], [place], [address] Phone: [phone]   Fax:  [phone]  Name: Sara Gardner MRN: [id number] Date of Birth: July 30, 1950

## 2021-08-10 ENCOUNTER — Ambulatory Visit: Payer: Medicare Other | Admitting: Physical Therapy

## 2021-08-10 ENCOUNTER — Other Ambulatory Visit: Payer: Self-pay

## 2021-08-10 ENCOUNTER — Encounter: Payer: Self-pay | Admitting: Physical Therapy

## 2021-08-10 DIAGNOSIS — R201 Hypoesthesia of skin: Secondary | ICD-10-CM

## 2021-08-10 DIAGNOSIS — M6281 Muscle weakness (generalized): Secondary | ICD-10-CM

## 2021-08-10 DIAGNOSIS — R293 Abnormal posture: Secondary | ICD-10-CM

## 2021-08-10 DIAGNOSIS — R202 Paresthesia of skin: Secondary | ICD-10-CM | POA: Diagnosis not present

## 2021-08-10 NOTE — Patient Instructions (Signed)
Access Code: [email or web address] URL: [email or web address] Date: 08/10/2021 Prepared by: Almyra Free  Exercises Seated Upper Trapezius Stretch - 1 x daily - 7 x weekly - 1 sets - 3 reps - 20-30 seconds hold Gentle Levator Scapulae Stretch - 1 x daily - 7 x weekly - 1 sets - 3 reps - 20-30 seconds hold Seated Scapular Retraction - 1 x daily - 7 x weekly - 1 sets - 10 reps - 5 seconds hold Seated Cervical Rotation AROM - 2 x daily - 7 x weekly - 1 sets - 10 reps - 5 sec hold  Patient Education TENS Unit Trigger Point Dry Needling

## 2021-08-10 NOTE — Therapy (Signed)
[place], [place], [address] Phone: [phone]   Fax:  [phone]  Physical Therapy Treatment  Patient Details  Name: Sara Gardner MRN: [id number] Date of Birth: [birth year] Referring Provider (PT): Samuel Bouche   Encounter Date: 08/10/2021   PT End of Session - 08/10/21 0805     Visit Number 2    Number of Visits 12    Date for PT Re-Evaluation 09/16/21    Authorization Type UHC    PT Start Time 0805    PT Stop Time 0849    PT Time Calculation (min) 44 min    Activity Tolerance Patient tolerated treatment well    Behavior During Therapy [facility] for tasks assessed/performed             Past Medical History:  Diagnosis Date   Aortic atherosclerosis ([place]) 06/10/2017   CAD (coronary artery disease)    Cancer ([place])    Cardiomegaly 06/10/2017   Combined form of age-related cataract, both eyes 04/07/2017   Dermatochalasis of both eyelids 04/07/2017   Diabetes mellitus without complication (HCC)    Diastolic heart failure, NYHA class 1 ([place])    GERD (gastroesophageal reflux disease)    Hypertension    Lacunar infarction ([place]) 07/20/2018   Left renal mass 10/04/2018   1.7 cm hypoechoic mass 10/04/2018   Lumbar degenerative disc disease 03/09/2017   Myocardial infarction ([place]) 2005   Normocytic anemia 12/14/2018   Papillary thyroid carcinoma ([place])    Posterior vitreous detachment, right eye 04/07/2017   Thyroid disease     Past Surgical History:  Procedure Laterality Date   TOTAL THYROIDECTOMY  2016    There were no vitals filed for this visit.   Subjective Assessment - 08/10/21 0806     Subjective Still having tingling both hands.    Patient Stated Goals reduce tingling/burning    Currently in Pain? No/denies                               [facility] Adult PT Treatment/Exercise - 08/10/21 0001       Neck Exercises: Theraband   Shoulder Extension 10 reps;Green     Shoulder Extension Limitations use red next time    Rows 15 reps;Green    Horizontal ABduction 15 reps;Green      Neck Exercises: Seated   Cervical Rotation Left;Right;5 reps    Cervical Rotation Limitations 5 sec      Neck Exercises: Stretches   Upper Trapezius Stretch Right;Left;2 reps;30 seconds    Levator Stretch Right;Left;2 reps;30 seconds      Modalities   Modalities Electrical Stimulation;Moist Heat      Moist Heat Therapy   Number Minutes Moist Heat 10 Minutes    Moist Heat Location Cervical      Electrical Stimulation   Electrical Stimulation Location c    Electrical Stimulation Action IFC    Electrical Stimulation Parameters x 10 min to tolerance    Electrical Stimulation Goals Pain      Manual Therapy   Manual Therapy Soft tissue mobilization;Joint mobilization    Joint Mobilization CPA/UPA gd III mobs to C3-7    Soft tissue mobilization to bil UT, LS and cervical paraspinals                     PT Education - 08/10/21 0839     Education Details HEP; DN  Person(s) Educated Patient    Methods Explanation;Demonstration;Handout    Comprehension Verbalized understanding;Returned demonstration                 PT Long Term Goals - 08/05/21 1235       PT LONG TERM GOAL #1   Title Pt will be independent with HEP    Time 6    Period Weeks    Status New    Target Date 09/16/21      PT LONG TERM GOAL #2   Title Pt will improve bilat UE strength to 4+/5 to tolerate yard work with decreased pain    Time 6    Period Weeks    Status New    Target Date 09/16/21      PT LONG TERM GOAL #3   Title Pt will improve cervical sidebending and rotation ROM by 10 degrees to perform IADLs with decreased pain    Time 6    Period Weeks    Status New    Target Date 09/16/21                   Plan - 08/10/21 0841     Clinical Impression Statement Patient is very tight in bil UT and levators left>right. She responded well to manual  therapy and would likely benefit from DN next visit to these areas. H/O provided.    PT Frequency 2x / week    PT Duration 6 weeks    PT Treatment/Interventions Taping;Dry needling;Manual techniques;Passive range of motion;Patient/family education;Therapeutic exercise;Therapeutic activities;Neuromuscular re-education;Traction;Moist Heat;Electrical Stimulation;Cryotherapy    PT Next Visit Plan assess HEP, cervical mobility, manual/modalties, postural strength, DN    PT Home Exercise Plan [email or web address]    Consulted and Agree with Plan of Care Patient             Patient will benefit from skilled therapeutic intervention in order to improve the following deficits and impairments:  Pain, Postural dysfunction, Decreased strength, Decreased activity tolerance, Decreased range of motion, Hypomobility  Visit Diagnosis: Impaired sensation  Abnormal posture  Muscle weakness (generalized)     Problem List Patient Active Problem List   Diagnosis Date Noted   Hypotension 05/26/2021   Neck pain 05/26/2021   Atherosclerosis 03/19/2020   CHF (congestive heart failure) ([place]) 11/06/2019   Other specified disorders of kidney and ureter 04/05/2019   Renal insufficiency 04/05/2019   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/05/2019   Cervicalgia 04/05/2019   Eustachian tube dysfunction, bilateral 04/05/2019   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[place]) 04/05/2019   White matter abnormality on MRI of brain 04/05/2019   Elevated creatine kinase 12/14/2018   Myalgia due to statin 12/14/2018   Normocytic anemia 12/14/2018   Acute reaction to situational stress 11/30/2018   At moderate risk for fall 10/03/2018   Bilateral renal cysts 10/03/2018   Facet arthropathy, lumbosacral 10/03/2018   Microscopic hematuria 10/03/2018   Lacunar infarction ([place]) 07/20/2018   Craniofacial pain 07/04/2018   Proteinuria 03/21/2018   Hematuria 03/21/2018   Controlled type 2 diabetes mellitus without complication, without long-term  current use of insulin ([place]) 03/08/2018   Encounter for weight management 01/06/2018   Urge incontinence 08/23/2017   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/23/2017   Urinary frequency 08/17/2017   Overactive bladder 08/17/2017   Equinus contracture of ankle 07/27/2017   Hammer toes of both feet 07/27/2017   Dermatophytosis, nail 07/27/2017   Calcaneal spur of both feet 07/27/2017   Dysuria 07/07/2017   Atypical chest pain 06/10/2017  Aortic atherosclerosis ([place]) 06/10/2017   Cardiomegaly 06/10/2017   Vitreous detachment of right eye 05/12/2017   Combined form of age-related cataract, both eyes 04/07/2017   Dermatochalasis of both eyelids 04/07/2017   Posterior vitreous detachment, right eye 04/07/2017   Acute left-sided low back pain without sciatica 03/09/2017   Postural kyphosis of cervicothoracic region 03/09/2017   Lumbar degenerative disc disease 03/09/2017   Primary osteoarthritis of left knee 03/04/2017   Chronic pain of left knee 03/04/2017   Bilateral primary osteoarthritis of knee 12/15/2016   Bilateral lower extremity edema 12/15/2016   Allergy to honey bee venom 12/15/2016   Essential hypertension 02/16/2016   DM type 2 with diabetic dyslipidemia ([place]) 02/10/2016   Gastroesophageal reflux disease without esophagitis 02/10/2016   Breast pain, left 12/30/2015   Hypothyroidism, 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   Diabetic sensorimotor polyneuropathy ([place]) 10/27/2015   Papillary thyroid carcinoma ([place]) 08/09/2014   Post-menopausal atrophic vaginitis 06/24/2014   Left thyroid nodule 01/29/2014   Nodule of left lung 01/21/2014   Class 1 obesity due to excess calories with serious comorbidity and body mass index (BMI) of 30.0 to 30.9 in adult 06/12/2012   Palpitations 11/29/2011   Abnormal mammogram of right breast 08/30/2011   Arthritis of left knee 05/24/2011   Coronary artery disease involving native coronary artery of native heart with angina pectoris ([place])  05/24/2011   History of acute myocardial infarction of inferior wall 05/24/2011   Madelyn Flavors, PT 08/10/2021, 8:44 AM  [facility] [place], [place], [address] Phone: [phone]   Fax:  [phone]  Name: Sara Gardner MRN: [id number] Date of Birth: [birth year]

## 2021-08-12 ENCOUNTER — Other Ambulatory Visit: Payer: Self-pay

## 2021-08-12 ENCOUNTER — Ambulatory Visit: Payer: Medicare Other | Admitting: Physical Therapy

## 2021-08-14 ENCOUNTER — Telehealth: Payer: Self-pay | Admitting: Medical-Surgical

## 2021-08-14 NOTE — Telephone Encounter (Signed)
Pt called concerned about her stool being black. She said it has been this way for the last 3 days.

## 2021-08-14 NOTE — Telephone Encounter (Signed)
She will need an appointment

## 2021-08-17 ENCOUNTER — Ambulatory Visit: Payer: Self-pay | Admitting: Medical-Surgical

## 2021-08-17 DIAGNOSIS — Z91199 Patient's noncompliance with other medical treatment and regimen due to unspecified reason: Secondary | ICD-10-CM

## 2021-08-17 NOTE — [place] Notes (Signed)
   Complete physical exam  Patient: Sara Gardner   DOB: 05/15/2000   71 y.o. Female  MRN: [id number]  Subjective:    No chief complaint on file.   Sara Gardner is a 71 y.o. female who presents today for a complete physical exam. She reports consuming a {diet types:17450} diet. {types:19826} She generally feels {DESC; WELL/FAIRLY WELL/POORLY:18703}. She reports sleeping {DESC; WELL/FAIRLY WELL/POORLY:18703}. She {does/does not:200015} have additional problems to discuss today.    Most recent fall risk assessment:    01/20/2022   10:42 AM  Fall Risk   Falls in the past year? 0  Number falls in past yr: 0  Injury with Fall? 0  Risk for fall due to : No Fall Risks  Follow up Falls evaluation completed     Most recent depression screenings:    01/20/2022   10:42 AM 12/11/2020   10:46 AM  PHQ 2/9 Scores  PHQ - 2 Score 0 0  PHQ- 9 Score 5     {VISON DENTAL STD PSA (Optional):27386}  {History (Optional):23778}  Patient Care Team: Kimm Sider, NP as PCP - General (Nurse Practitioner)   Outpatient Medications Prior to Visit  Medication Sig   fluticasone (FLONASE) 50 MCG/ACT nasal spray Place 2 sprays into both nostrils in the morning and at bedtime. After 7 days, reduce to once daily.   norgestimate-ethinyl estradiol (SPRINTEC 28) 0.25-35 MG-MCG tablet Take 1 tablet by mouth daily.   Nystatin POWD Apply liberally to affected area 2 times per day   spironolactone (ALDACTONE) 100 MG tablet Take 1 tablet (100 mg total) by mouth daily.   No facility-administered medications prior to visit.    ROS        Objective:     There were no vitals taken for this visit. {Vitals History (Optional):23777}  Physical Exam   No results found for any visits on 02/25/22. {Show previous labs (optional):23779}    Assessment & Plan:    Routine Health Maintenance and Physical Exam  Immunization History  Administered Date(s) Administered   DTaP 07/29/1999, 09/24/1999,  12/03/1999, 08/18/2000, 03/03/2004   Hepatitis A 12/29/2007, 01/03/2009   Hepatitis B 05/16/1999, 06/23/1999, 12/03/1999   HiB (PRP-OMP) 07/29/1999, 09/24/1999, 12/03/1999, 08/18/2000   IPV 07/29/1999, 09/24/1999, 05/23/2000, 03/03/2004   Influenza,inj,Quad PF,6+ Mos 04/05/2014   Influenza-Unspecified 07/05/2012   MMR 05/23/2001, 03/03/2004   Meningococcal Polysaccharide 01/03/2012   Pneumococcal Conjugate-13 08/18/2000   Pneumococcal-Unspecified 12/03/1999, 02/16/2000   Tdap 01/03/2012   Varicella 05/23/2000, 12/29/2007    Health Maintenance  Topic Date Due   HIV Screening  Never done   Hepatitis C Screening  Never done   INFLUENZA VACCINE  02/23/2022   PAP-Cervical Cytology Screening  02/25/2022 (Originally 05/14/2020)   PAP SMEAR-Modifier  02/25/2022 (Originally 05/14/2020)   TETANUS/TDAP  02/25/2022 (Originally 01/02/2022)   HPV VACCINES  Discontinued   COVID-19 Vaccine  Discontinued    Discussed health benefits of physical activity, and encouraged her to engage in regular exercise appropriate for her age and condition.  Problem List Items Addressed This Visit   None Visit Diagnoses     Annual physical exam    -  Primary   Cervical cancer screening       Need for Tdap vaccination          No follow-ups on file.     Yared Barefoot, NP   

## 2021-08-19 ENCOUNTER — Encounter: Payer: Self-pay | Admitting: Physical Therapy

## 2021-08-19 ENCOUNTER — Other Ambulatory Visit: Payer: Self-pay

## 2021-08-19 ENCOUNTER — Ambulatory Visit: Payer: Medicare Other | Admitting: Physical Therapy

## 2021-08-19 DIAGNOSIS — R293 Abnormal posture: Secondary | ICD-10-CM

## 2021-08-19 DIAGNOSIS — R201 Hypoesthesia of skin: Secondary | ICD-10-CM

## 2021-08-19 DIAGNOSIS — R202 Paresthesia of skin: Secondary | ICD-10-CM | POA: Diagnosis not present

## 2021-08-19 DIAGNOSIS — M6281 Muscle weakness (generalized): Secondary | ICD-10-CM

## 2021-08-19 NOTE — Therapy (Signed)
[facility] [place], [place], [address] Phone: [phone]   Fax:  [phone]  Physical Therapy Treatment  Patient Details  Name: Sara Gardner MRN: [id number] Date of Birth: 07/22/1951 Referring Provider (PT): Samuel Bouche   Encounter Date: 08/19/2021   PT End of Session - 08/19/21 1522     Visit Number 3    Number of Visits 12    Date for PT Re-Evaluation 09/16/21    Authorization Type UHC    PT Start Time 7371    PT Stop Time 0626    PT Time Calculation (min) 48 min             Past Medical History:  Diagnosis Date   Aortic atherosclerosis ([place]) 06/10/2017   CAD (coronary artery disease)    Cancer ([place])    Cardiomegaly 06/10/2017   Combined form of age-related cataract, both eyes 04/07/2017   Dermatochalasis of both eyelids 04/07/2017   Diabetes mellitus without complication (HCC)    Diastolic heart failure, NYHA class 1 ([place])    GERD (gastroesophageal reflux disease)    Hypertension    Lacunar infarction ([place]) 07/20/2018   Left renal mass 10/04/2018   1.7 cm hypoechoic mass 10/04/2018   Lumbar degenerative disc disease 03/09/2017   Myocardial infarction ([place]) 2005   Normocytic anemia 12/14/2018   Papillary thyroid carcinoma ([place])    Posterior vitreous detachment, right eye 04/07/2017   Thyroid disease     Past Surgical History:  Procedure Laterality Date   TOTAL THYROIDECTOMY  2016    There were no vitals filed for this visit.   Subjective Assessment - 08/19/21 1523     Subjective "I'm stiff".  Pt reports no changes since last visit.    Diagnostic tests x ray of neck: cervical disc degeneration - moderate    Patient Stated Goals reduce tingling/burning    Currently in Pain? Yes    Pain Score 3     Pain Location Neck    Pain Orientation Left;Right    Pain Descriptors / Indicators Tightness    Aggravating Factors  ?    Pain Relieving Factors heat, massage, TENS                 [facility] PT Assessment - 08/19/21 0001       Assessment   Medical Diagnosis parasthesias    Referring Provider (PT) Charna Archer, [name]    Onset Date/Surgical Date 10/30/20    Next MD Visit PRN               [facility] Adult PT Treatment/Exercise - 08/19/21 0001       Neck Exercises: Machines for Strengthening   UBE (Upper Arm Bike) L1: 1 min forward, 1 min backward.      Neck Exercises: Theraband   Rows 20 reps;Red      Neck Exercises: Seated   Cervical Rotation Left;Right;5 reps   with head nods   Lateral Flexion Right;Left;5 reps      Neck Exercises: Stretches   Levator Stretch Right;Left;1 rep    Other Neck Stretches scalene stretch with towel anchor over shoulder x 3 reps of 20 sec each.   Middle doorway stretch x 10 sec x 4 reps       Moist Heat Therapy   Number Minutes Moist Heat 10 Minutes    Moist Heat Location Cervical      Electrical Stimulation   Electrical Stimulation Location bilat cervical paraspinals, bilat upper trap  Electrical Stimulation Action IFC    Electrical Stimulation Parameters 10 min, intensity to tolerance    Electrical Stimulation Goals Pain      Manual Therapy   Soft tissue mobilization STM to bilat upper trap, scalenes, levator, cervical paraspinals.                     PT Education - 08/19/21 1711     Education Details HEP, issued red band    Person(s) Educated Patient    Methods Explanation;Handout;Demonstration;Verbal cues    Comprehension Verbalized understanding;Returned demonstration                 PT Long Term Goals - 08/05/21 1235       PT LONG TERM GOAL #1   Title Pt will be independent with HEP    Time 6    Period Weeks    Status New    Target Date 09/16/21      PT LONG TERM GOAL #2   Title Pt will improve bilat UE strength to 4+/5 to tolerate yard work with decreased pain    Time 6    Period Weeks    Status New    Target Date 09/16/21      PT LONG TERM GOAL #3   Title Pt will  improve cervical sidebending and rotation ROM by 10 degrees to perform IADLs with decreased pain    Time 6    Period Weeks    Status New    Target Date 09/16/21                   Plan - 08/19/21 1706     Clinical Impression Statement Continued tightness in bilat neck musculature.  Good tolerance to exercises today.  Pt reported reduction of tension and pain with use of estim and MHP at end of session.  HEP updated. Goals are ongoing.    PT Frequency 2x / week    PT Duration 6 weeks    PT Treatment/Interventions Taping;Dry needling;Manual techniques;Passive range of motion;Patient/family education;Therapeutic exercise;Therapeutic activities;Neuromuscular re-education;Traction;Moist Heat;Electrical Stimulation;Cryotherapy    PT Next Visit Plan cervical mobility, manual/modalties, postural strength, DN (not sure if pt interested)    PT Home Exercise Plan [email or web address]    Consulted and Agree with Plan of Care Patient             Patient will benefit from skilled therapeutic intervention in order to improve the following deficits and impairments:  Pain, Postural dysfunction, Decreased strength, Decreased activity tolerance, Decreased range of motion, Hypomobility  Visit Diagnosis: Impaired sensation  Abnormal posture  Muscle weakness (generalized)     Problem List Patient Active Problem List   Diagnosis Date Noted   Hypotension 05/26/2021   Neck pain 05/26/2021   Atherosclerosis 03/19/2020   CHF (congestive heart failure) ([place]) 11/06/2019   Other specified disorders of kidney and ureter 04/05/2019   Renal insufficiency 04/05/2019   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/05/2019   Cervicalgia 04/05/2019   Eustachian tube dysfunction, bilateral 04/05/2019   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[place]) 04/05/2019   White matter abnormality on MRI of brain 04/05/2019   Elevated creatine kinase 12/14/2018   Myalgia due to statin 12/14/2018   Normocytic anemia 12/14/2018   Acute reaction  to situational stress 11/30/2018   At moderate risk for fall 10/03/2018   Bilateral renal cysts 10/03/2018   Facet arthropathy, lumbosacral 10/03/2018   Microscopic hematuria 10/03/2018   Lacunar infarction ([place]) 07/20/2018   Craniofacial pain 07/04/2018  Proteinuria 03/21/2018   Hematuria 03/21/2018   Controlled type 2 diabetes mellitus without complication, without long-term current use of insulin ([place]) 03/08/2018   Encounter for weight management 01/06/2018   Urge incontinence 08/23/2017   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/23/2017   Urinary frequency 08/17/2017   Overactive bladder 08/17/2017   Equinus contracture of ankle 07/27/2017   Hammer toes of both feet 07/27/2017   Dermatophytosis, nail 07/27/2017   Calcaneal spur of both feet 07/27/2017   Dysuria 07/07/2017   Atypical chest pain 06/10/2017   Aortic atherosclerosis ([place]) 06/10/2017   Cardiomegaly 06/10/2017   Vitreous detachment of right eye 05/12/2017   Combined form of age-related cataract, both eyes 04/07/2017   Dermatochalasis of both eyelids 04/07/2017   Posterior vitreous detachment, right eye 04/07/2017   Acute left-sided low back pain without sciatica 03/09/2017   Postural kyphosis of cervicothoracic region 03/09/2017   Lumbar degenerative disc disease 03/09/2017   Primary osteoarthritis of left knee 03/04/2017   Chronic pain of left knee 03/04/2017   Bilateral primary osteoarthritis of knee 12/15/2016   Bilateral lower extremity edema 12/15/2016   Allergy to honey bee venom 12/15/2016   Essential hypertension 02/16/2016   DM type 2 with diabetic dyslipidemia ([place]) 02/10/2016   Gastroesophageal reflux disease without esophagitis 02/10/2016   Breast pain, left 12/30/2015   Hypothyroidism, 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   Diabetic sensorimotor polyneuropathy ([place]) 10/27/2015   Papillary thyroid carcinoma ([place]) 08/09/2014   Post-menopausal atrophic vaginitis 06/24/2014   Left thyroid nodule  01/29/2014   Nodule of left lung 01/21/2014   Class 1 obesity due to excess calories with serious comorbidity and body mass index (BMI) of 30.0 to 30.9 in adult 06/12/2012   Palpitations 11/29/2011   Abnormal mammogram of right breast 08/30/2011   Arthritis of left knee 05/24/2011   Coronary artery disease involving native coronary artery of native heart with angina pectoris ([place]) 05/24/2011   History of acute myocardial infarction of inferior wall 05/24/2011   Kerin Perna, PTA 08/19/21 5:14 PM  [facility] [place], [place], [address] Phone: [phone]   Fax:  [phone]  Name: Sara Gardner MRN: [id number] Date of Birth: [birth year]

## 2021-08-19 NOTE — Patient Instructions (Signed)
Access Code: [email or web address] URL: [email or web address] Date: 08/19/2021 Prepared by: [facility] [place]  Exercises Seated Upper Trapezius Stretch - 1 x daily - 7 x weekly - 1 sets - 3 reps - 20-30 seconds hold Gentle Levator Scapulae Stretch - 1 x daily - 7 x weekly - 1 sets - 3 reps - 20-30 seconds hold Seated Scapular Retraction - 1 x daily - 7 x weekly - 1 sets - 10 reps - 5 seconds hold Seated Cervical Rotation AROM - 2 x daily - 7 x weekly - 1 sets - 10 reps - 5 sec hold Seated Scalene Stretch with Towel - 2 x daily - 7 x weekly - 1 sets - 2-3 reps - 20 seconds hold Doorway Pec Stretch at 90 Degrees Abduction - 2 x daily - 7 x weekly - 1 sets - 2-3 reps - 10 seconds hold Standing Row with Anchored Resistance - 1 x daily - 3 x weekly - 2 sets - 10 reps

## 2021-08-20 ENCOUNTER — Ambulatory Visit: Payer: Medicare Other | Admitting: Medical-Surgical

## 2021-08-21 ENCOUNTER — Ambulatory Visit (INDEPENDENT_AMBULATORY_CARE_PROVIDER_SITE_OTHER): Payer: Medicare Other | Admitting: Medical-Surgical

## 2021-08-21 ENCOUNTER — Encounter: Payer: Self-pay | Admitting: Medical-Surgical

## 2021-08-21 ENCOUNTER — Other Ambulatory Visit: Payer: Self-pay

## 2021-08-21 ENCOUNTER — Ambulatory Visit: Payer: Medicare Other | Admitting: Physical Therapy

## 2021-08-21 VITALS — BP 105/66 | HR 64 | Resp 20 | Ht 61.0 in | Wt 165.0 lb

## 2021-08-21 DIAGNOSIS — K921 Melena: Secondary | ICD-10-CM

## 2021-08-21 DIAGNOSIS — R201 Hypoesthesia of skin: Secondary | ICD-10-CM

## 2021-08-21 DIAGNOSIS — M6281 Muscle weakness (generalized): Secondary | ICD-10-CM

## 2021-08-21 DIAGNOSIS — R293 Abnormal posture: Secondary | ICD-10-CM

## 2021-08-21 DIAGNOSIS — R202 Paresthesia of skin: Secondary | ICD-10-CM | POA: Diagnosis not present

## 2021-08-21 NOTE — Therapy (Signed)
[place], [place], [address] Phone: [phone]   Fax:  [phone]  Physical Therapy Treatment  Patient Details  Name: Sara Gardner MRN: [id number] Date of Birth: April 22, 1951 Referring Provider (PT): Samuel Bouche   Encounter Date: 08/21/2021   PT End of Session - 08/21/21 0843     Visit Number 4    Number of Visits 12    Date for PT Re-Evaluation 09/16/21    PT Start Time 0808    PT Stop Time 0848    PT Time Calculation (min) 40 min    Activity Tolerance Patient tolerated treatment well    Behavior During Therapy [facility] for tasks assessed/performed             Past Medical History:  Diagnosis Date   Aortic atherosclerosis ([place]) 06/10/2017   CAD (coronary artery disease)    Cancer ([place])    Cardiomegaly 06/10/2017   Combined form of age-related cataract, both eyes 04/07/2017   Dermatochalasis of both eyelids 04/07/2017   Diabetes mellitus without complication (HCC)    Diastolic heart failure, NYHA class 1 ([place])    GERD (gastroesophageal reflux disease)    Hypertension    Lacunar infarction ([place]) 07/20/2018   Left renal mass 10/04/2018   1.7 cm hypoechoic mass 10/04/2018   Lumbar degenerative disc disease 03/09/2017   Myocardial infarction ([place]) 2005   Normocytic anemia 12/14/2018   Papillary thyroid carcinoma ([place])    Posterior vitreous detachment, right eye 04/07/2017   Thyroid disease     Past Surgical History:  Procedure Laterality Date   TOTAL THYROIDECTOMY  2016    There were no vitals filed for this visit.   Subjective Assessment - 08/21/21 0810     Subjective Pt states "I feel better"    Patient Stated Goals reduce tingling/burning    Currently in Pain? Yes    Pain Score 1     Pain Location Neck    Pain Descriptors / Indicators Tightness                [facility] PT Assessment - 08/21/21 0001       Assessment   Medical Diagnosis parasthesias    Referring  Provider (PT) Charna Archer, [name]    Onset Date/Surgical Date 10/30/20      AROM   Cervical - Right Side Bend 25    Cervical - Left Side Bend 35    Cervical - Right Rotation 35    Cervical - Left Rotation 30                           [facility] Adult PT Treatment/Exercise - 08/21/21 0001       Neck Exercises: Machines for Strengthening   UBE (Upper Arm Bike) L1 x 2 min alt fwd/bkwd      Neck Exercises: Theraband   Shoulder Extension 10 reps;Red    Rows 20 reps;Red    Horizontal ABduction Red;15 reps      Neck Exercises: Seated   Cervical Rotation Left;Right;5 reps   with head nods   Lateral Flexion Right;Left;5 reps      Neck Exercises: Stretches   Levator Stretch Right;Left;1 rep    Other Neck Stretches scalene stretch with towel anchor over shoulder x 3 reps of 20 sec each.      Moist Heat Therapy   Number Minutes Moist Heat 10 Minutes    Moist Heat Location Cervical  Insurance account manager Action TENS    Electrical Stimulation Parameters to tolerance    Electrical Stimulation Goals Pain      Manual Therapy   Soft tissue mobilization STM to bilat upper trap, scalenes, levator, cervical paraspinals.                          PT Long Term Goals - 08/05/21 1235       PT LONG TERM GOAL #1   Title Pt will be independent with HEP    Time 6    Period Weeks    Status New    Target Date 09/16/21      PT LONG TERM GOAL #2   Title Pt will improve bilat UE strength to 4+/5 to tolerate yard work with decreased pain    Time 6    Period Weeks    Status New    Target Date 09/16/21      PT LONG TERM GOAL #3   Title Pt will improve cervical sidebending and rotation ROM by 10 degrees to perform IADLs with decreased pain    Time 6    Period Weeks    Status New    Target Date 09/16/21                   Plan - 08/21/21 0844     Clinical Impression Statement Pt  continues with increased mm spasticity in Lt scalenes and upper traps. She demos improved ROM for cervical sidebending and rotation but has not reached goal level. Pt requires cues for posture during exercises    PT Next Visit Plan cervical mobility, manual/modalties, postural strength    PT Home Exercise Plan [email or web address]    Consulted and Agree with Plan of Care Patient             Patient will benefit from skilled therapeutic intervention in order to improve the following deficits and impairments:     Visit Diagnosis: Impaired sensation  Abnormal posture  Muscle weakness (generalized)     Problem List Patient Active Problem List   Diagnosis Date Noted   Hypotension 05/26/2021   Neck pain 05/26/2021   Atherosclerosis 03/19/2020   CHF (congestive heart failure) ([place]) 11/06/2019   Other specified disorders of kidney and ureter 04/05/2019   Renal insufficiency 04/05/2019   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/05/2019   Cervicalgia 04/05/2019   Eustachian tube dysfunction, bilateral 04/05/2019   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[place]) 04/05/2019   White matter abnormality on MRI of brain 04/05/2019   Elevated creatine kinase 12/14/2018   Myalgia due to statin 12/14/2018   Normocytic anemia 12/14/2018   Acute reaction to situational stress 11/30/2018   At moderate risk for fall 10/03/2018   Bilateral renal cysts 10/03/2018   Facet arthropathy, lumbosacral 10/03/2018   Microscopic hematuria 10/03/2018   Lacunar infarction ([place]) 07/20/2018   Craniofacial pain 07/04/2018   Proteinuria 03/21/2018   Hematuria 03/21/2018   Controlled type 2 diabetes mellitus without complication, without long-term current use of insulin ([place]) 03/08/2018   Encounter for weight management 01/06/2018   Urge incontinence 08/23/2017   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/23/2017   Urinary frequency 08/17/2017   Overactive bladder 08/17/2017   Equinus contracture of ankle 07/27/2017   Hammer  toes of both feet 07/27/2017   Dermatophytosis, nail 07/27/2017   Calcaneal spur of both feet 07/27/2017   Dysuria 07/07/2017   Atypical  chest pain 06/10/2017   Aortic atherosclerosis ([place]) 06/10/2017   Cardiomegaly 06/10/2017   Vitreous detachment of right eye 05/12/2017   Combined form of age-related cataract, both eyes 04/07/2017   Dermatochalasis of both eyelids 04/07/2017   Posterior vitreous detachment, right eye 04/07/2017   Acute left-sided low back pain without sciatica 03/09/2017   Postural kyphosis of cervicothoracic region 03/09/2017   Lumbar degenerative disc disease 03/09/2017   Primary osteoarthritis of left knee 03/04/2017   Chronic pain of left knee 03/04/2017   Bilateral primary osteoarthritis of knee 12/15/2016   Bilateral lower extremity edema 12/15/2016   Allergy to honey bee venom 12/15/2016   Essential hypertension 02/16/2016   DM type 2 with diabetic dyslipidemia ([place]) 02/10/2016   Gastroesophageal reflux disease without esophagitis 02/10/2016   Breast pain, left 12/30/2015   Hypothyroidism, 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   Diabetic sensorimotor polyneuropathy ([place]) 10/27/2015   Papillary thyroid carcinoma ([place]) 08/09/2014   Post-menopausal atrophic vaginitis 06/24/2014   Left thyroid nodule 01/29/2014   Nodule of left lung 01/21/2014   Class 1 obesity due to excess calories with serious comorbidity and body mass index (BMI) of 30.0 to 30.9 in adult 06/12/2012   Palpitations 11/29/2011   Abnormal mammogram of right breast 08/30/2011   Arthritis of left knee 05/24/2011   Coronary artery disease involving native coronary artery of native heart with angina pectoris ([place]) 05/24/2011   History of acute myocardial infarction of inferior wall 05/24/2011    Vertis Bauder, PT 08/21/2021, 8:46 AM  [facility], [place], [address] Phone: [phone]   Fax:  [phone]  Name: Sara Gardner MRN: [id number] Date of Birth: 04/21/1951

## 2021-08-21 NOTE — Progress Notes (Signed)
°  HPI with pertinent ROS:   CC: Black tarry stools  HPI: Pleasant 71 year old female presenting today with complaints of black stools.  Notes that she started with diarrhea approximately a week ago and ended up taking some Pepto-Bismol x2 doses.  She notes that her stool did turn a little black but started to improve.  After that, her stool returned to a black color and is described as sticky/tarry.  Initially, she had a foul odor to her stool but this is since resolved.  She has not had any significant abdominal pains, nausea, vomiting, hematochezia, or hematemesis.  No fevers but does endorse chills.  No history of ulcers, GI bleed, anticoagulant use.  Avoids NSAIDs.  I reviewed the past medical history, family history, social history, surgical history, and allergies today and no changes were needed.  Please see the problem list section below in epic for further details.   Physical exam:   General: Well Developed, well nourished, and in no acute distress.  Neuro: Alert and oriented x3. HEENT: Normocephalic, atraumatic.  Skin: Warm and dry. Cardiac: Regular rate and rhythm, no murmurs rubs or gallops, no lower extremity edema.  Respiratory: Clear to auscultation bilaterally. Not using accessory muscles, speaking in full sentences. Abdomen: Soft, nontender, nondistended. Bowel sounds + x 4 quadrants. No HSM appreciated.   Impression and Recommendations:    1. Black tarry stools Checking labs as below.  IFOBT cards given to patient with instructions on how to complete.  Consider possible GI bleed versus medication side effect.  This does seem to be improving per patient report so recommend monitoring for any further signs or symptoms.  If she develops abdominal pain, dizziness, lightheadedness, near syncope, or other concerning GI symptoms, she should seek evaluation at urgent care or the nearest emergency room. - CBC with Differential - COMPLETE METABOLIC PANEL WITH GFR - TSH - IFOBT POC  (occult bld, rslt in office); Future  Return if symptoms worsen or fail to improve. ___________________________________________ Clearnce Sorrel, DNP, APRN, FNP-BC [facility] [place]

## 2021-08-22 LAB — CBC WITH DIFFERENTIAL/PLATELET
Absolute Monocytes: 556 cells/uL (ref 200–950)
Basophils Absolute: 39 cells/uL (ref 0–200)
Basophils Relative: 0.7 %
Eosinophils Absolute: 209 cells/uL (ref 15–500)
Eosinophils Relative: 3.8 %
HCT: 34.8 % — ABNORMAL LOW (ref 35.0–45.0)
Hemoglobin: 11.2 g/dL — ABNORMAL LOW (ref 11.7–15.5)
Lymphs Abs: 2580 cells/uL (ref 850–3900)
MCH: 26.7 pg — ABNORMAL LOW (ref 27.0–33.0)
MCHC: 32.2 g/dL (ref 32.0–36.0)
MCV: 82.9 fL (ref 80.0–100.0)
MPV: 11.3 fL (ref 7.5–12.5)
Monocytes Relative: 10.1 %
Neutro Abs: 2118 cells/uL (ref 1500–7800)
Neutrophils Relative %: 38.5 %
Platelets: 262 10*3/uL (ref 140–400)
RBC: 4.2 10*6/uL (ref 3.80–5.10)
RDW: 15.2 % — ABNORMAL HIGH (ref 11.0–15.0)
Total Lymphocyte: 46.9 %
WBC: 5.5 10*3/uL (ref 3.8–10.8)

## 2021-08-22 LAB — COMPLETE METABOLIC PANEL WITH GFR
AG Ratio: 1.5 (calc) (ref 1.0–2.5)
ALT: 26 U/L (ref 6–29)
AST: 23 U/L (ref 10–35)
Albumin: 4 g/dL (ref 3.6–5.1)
Alkaline phosphatase (APISO): 57 U/L (ref 37–153)
BUN/Creatinine Ratio: 13 (calc) (ref 6–22)
BUN: 20 mg/dL (ref 7–25)
CO2: 34 mmol/L — ABNORMAL HIGH (ref 20–32)
Calcium: 9.8 mg/dL (ref 8.6–10.4)
Chloride: 105 mmol/L (ref 98–110)
Creat: 1.6 mg/dL — ABNORMAL HIGH (ref 0.60–1.00)
Globulin: 2.6 g/dL (calc) (ref 1.9–3.7)
Glucose, Bld: 88 mg/dL (ref 65–139)
Potassium: 4.3 mmol/L (ref 3.5–5.3)
Sodium: 144 mmol/L (ref 135–146)
Total Bilirubin: 0.2 mg/dL (ref 0.2–1.2)
Total Protein: 6.6 g/dL (ref 6.1–8.1)
eGFR: 34 mL/min/{1.73_m2} — ABNORMAL LOW (ref >=60)

## 2021-08-22 LAB — TSH: TSH: 0.39 mIU/L — ABNORMAL LOW (ref 0.40–4.50)

## 2021-08-24 ENCOUNTER — Other Ambulatory Visit: Payer: Self-pay

## 2021-08-24 DIAGNOSIS — E890 Postprocedural hypothyroidism: Secondary | ICD-10-CM

## 2021-08-24 DIAGNOSIS — I10 Essential (primary) hypertension: Secondary | ICD-10-CM

## 2021-08-24 NOTE — Progress Notes (Signed)
Ordered labs per result note.  °

## 2021-08-25 NOTE — Progress Notes (Signed)
HPI:FU CAD. Previously followed by Dr. Roslynn Amble. Apparently had myocardial infarction in 2005; cardiac catheterization revealed occluded infarct vessel which was treated medically. Not all records available. Echocardiogram July 2017 showed normal LV function, grade 1 diastolic dysfunction left atrial enlargement. Carotid Dopplers January 2020 showed no significant stenosis. Last nuclear study April 2020 showed ejection fraction 80%. Moderate perfusion defect posterior lateral wall. Cannot exclude ischemia.. Patient treated medically.  Patient seen in the emergency room in [facility] December 14 with atypical chest pain.  Troponin was normal, creatinine 1.8, D-dimer 660 with upper limit of normal being 500 and hemoglobin 12.  Patient apparently left the emergency room [facility]. Seen yesterday with palpitations. Since last seen, pt feels as thought " her neighbor is using electromagnetic wave against her". When she becomes upset she has palpitations and CP but not at other times.  Current Outpatient Medications  Medication Sig Dispense Refill   AMBULATORY NON FORMULARY MEDICATION Knee-high, medium compression, graduated compression stockings. Apply to lower extremities. [email or web address], Zippered Compression Stockings, medium circ, long length 1 each 0   ammonium lactate (LAC-HYDRIN) 12 % lotion APPLY TOPICALLY TO THE AFFECTED AREA AS NEEDED FOR DRY SKIN 400 g 0   ASPIRIN PO Take 81 mg by mouth daily.     blood glucose meter kit and supplies Dispense based on patient and insurance preference. Check daily. (FOR ICD-10 E10.9, E11.9). 1 each 0   DICLOFENAC SODIUM EX Apply topically as needed.     Dulaglutide (TRULICITY) 1.5 KT/6.2BW SOPN Inject 0.5 mLs (1.5 mg total) into the skin once a week. 6 mL 5   esomeprazole (NEXIUM) 20 MG capsule TAKE 1 CAPSULE(20 MG) BY MOUTH DAILY 90 capsule 1   fluticasone (FLONASE) 50 MCG/ACT nasal spray Place into both nostrils as needed for  allergies or rhinitis.     isosorbide mononitrate (IMDUR) 30 MG 24 hr tablet Take 1 tablet (30 mg total) by mouth daily. 90 tablet 3   levothyroxine (SYNTHROID, LEVOTHROID) 100 MCG tablet 100 mg daily, except on Sunday.  1   metFORMIN (GLUCOPHAGE) 1000 MG tablet TAKE 1 TABLET(1000 MG) BY MOUTH EVERY MORNING 90 tablet 0   metoprolol succinate (TOPROL-XL) 50 MG 24 hr tablet Take 1 tablet (50 mg total) by mouth daily. Take with or immediately following a meal. 90 tablet 3   nitroGLYCERIN (NITROSTAT) 0.4 MG SL tablet DISSOLVE 1 TABLET UNDER THE TONGUE EVERY 5 MINUTES, AS NEEDED FOR CHEST PAIN. MAX 3 TABLETS IN 15 MINUTES 25 tablet 0   ONETOUCH DELICA LANCETS FINE MISC USE TO CHECK FASTING BLOOD SUGAR DAILY AND 2 HOURS AFTER LARGEST MEAL OF THE DAY. Dx E11.69 100 each 1   ONETOUCH ULTRA test strip USE TO CHECK BLOOD SUGAR EVERY DAY AND 2 HOURS AFTER LARGEST MEAL 100 each 3   rosuvastatin (CRESTOR) 40 MG tablet TAKE 1 TABLET(40 MG) BY MOUTH AT BEDTIME 90 tablet 0   No current facility-administered medications for this visit.     Past Medical History:  Diagnosis Date   Aortic atherosclerosis ([place]) 06/10/2017   CAD (coronary artery disease)    Cancer ([place])    Cardiomegaly 06/10/2017   Combined form of age-related cataract, both eyes 04/07/2017   Dermatochalasis of both eyelids 04/07/2017   Diabetes mellitus without complication (HCC)    Diastolic heart failure, NYHA class 1 (HCC)    GERD (gastroesophageal reflux disease)    Hypertension    Lacunar infarction ([place]) 07/20/2018   Left renal mass 10/04/2018  1.7 cm hypoechoic mass 10/04/2018   Lumbar degenerative disc disease 03/09/2017   Myocardial infarction [facility]) 2005   Normocytic anemia 12/14/2018   Papillary thyroid carcinoma (HCC)    Posterior vitreous detachment, right eye 04/07/2017   Thyroid disease     Past Surgical History:  Procedure Laterality Date   TOTAL THYROIDECTOMY  2016    Social History   Socioeconomic History   Marital  status: Single    Spouse name: Not on file   Number of children: 1   Years of education: Not on file   Highest education level: Not on file  Occupational History   Not on file  Tobacco Use   Smoking status: Former   Smokeless tobacco: Never  Vaping Use   Vaping Use: Never used  Substance and Sexual Activity   Alcohol use: No   Drug use: No   Sexual activity: Not Currently  Other Topics Concern   Not on file  Social History Narrative   Not on file   Social Determinants of Health   Financial Resource Strain: Not on file  Food Insecurity: Not on file  Transportation Needs: Not on file  Physical Activity: Not on file  Stress: Not on file  Social Connections: Not on file  Intimate Partner Violence: Not on file    Family History  Problem Relation Age of Onset   Hypertension Mother    Hypertension Sister    Hypertension Brother    Breast cancer Daughter    Hypertension Sister     ROS: no fevers or chills, productive cough, hemoptysis, dysphasia, odynophagia, melena, hematochezia, dysuria, hematuria, rash, seizure activity, orthopnea, PND, pedal edema, claudication. Remaining systems are negative.  Physical Exam: Well-developed well-nourished in no acute distress.  Skin is warm and dry.  HEENT is normal.  Neck is supple.  Chest is clear to auscultation with normal expansion.  Cardiovascular exam is regular rate and rhythm.  Abdominal exam nontender or distended. No masses palpated. Extremities show no edema. neuro grossly intact  ECG- 09/06/21; NSR, no ST changes; personally reviewed  A/P  1 coronary artery disease-Plan to continue medical therapy with aspirin and statin.  She has CP when she is "upset"; will arrange stress nuclear study for risk stratification.   2 hypertension-patient's blood pressure is controlled.  Continue present medications.  3 hyperlipidemia-continue statin.  4 palpiations-arrange 14 day zio patch to further assess.   Kirk Ruths,  MD

## 2021-08-26 ENCOUNTER — Ambulatory Visit: Payer: Medicare Other | Attending: Medical-Surgical | Admitting: Physical Therapy

## 2021-08-26 ENCOUNTER — Other Ambulatory Visit: Payer: Self-pay

## 2021-08-26 DIAGNOSIS — R293 Abnormal posture: Secondary | ICD-10-CM | POA: Diagnosis present

## 2021-08-26 DIAGNOSIS — M6281 Muscle weakness (generalized): Secondary | ICD-10-CM | POA: Diagnosis present

## 2021-08-26 DIAGNOSIS — R201 Hypoesthesia of skin: Secondary | ICD-10-CM | POA: Diagnosis not present

## 2021-08-26 DIAGNOSIS — K921 Melena: Secondary | ICD-10-CM

## 2021-08-26 LAB — POC HEMOCCULT BLD/STL (HOME/3-CARD/SCREEN)
Card #2 Fecal Occult Blod, POC: NEGATIVE
Card #3 Fecal Occult Blood, POC: NEGATIVE
Fecal Occult Blood, POC: NEGATIVE

## 2021-08-26 NOTE — Therapy (Signed)
[place], [place], [address] Phone: [phone]   Fax:  [phone]  Physical Therapy Treatment  Patient Details  Name: Annastyn Silvey MRN: [id number] Date of Birth: [birth year] Referring Provider (PT): Samuel Bouche   Encounter Date: 08/26/2021   PT End of Session - 08/26/21 0838     Visit Number 5    Number of Visits 12    Date for PT Re-Evaluation 09/16/21    PT Start Time 0803    PT Stop Time 0845    PT Time Calculation (min) 42 min    Activity Tolerance Patient tolerated treatment well    Behavior During Therapy [facility] for tasks assessed/performed             Past Medical History:  Diagnosis Date   Aortic atherosclerosis ([place]) 06/10/2017   CAD (coronary artery disease)    Cancer ([place])    Cardiomegaly 06/10/2017   Combined form of age-related cataract, both eyes 04/07/2017   Dermatochalasis of both eyelids 04/07/2017   Diabetes mellitus without complication (HCC)    Diastolic heart failure, NYHA class 1 ([place])    GERD (gastroesophageal reflux disease)    Hypertension    Lacunar infarction ([place]) 07/20/2018   Left renal mass 10/04/2018   1.7 cm hypoechoic mass 10/04/2018   Lumbar degenerative disc disease 03/09/2017   Myocardial infarction ([place]) 2005   Normocytic anemia 12/14/2018   Papillary thyroid carcinoma ([place])    Posterior vitreous detachment, right eye 04/07/2017   Thyroid disease     Past Surgical History:  Procedure Laterality Date   TOTAL THYROIDECTOMY  2016    There were no vitals filed for this visit.   Subjective Assessment - 08/26/21 0805     Subjective Pt states she feels like she is "loosening up"    Patient Stated Goals reduce tingling/burning    Currently in Pain? Yes    Pain Score 5     Pain Location Neck    Pain Orientation Left                [facility] PT Assessment - 08/26/21 0001       Assessment   Medical Diagnosis parasthesias    Referring  Provider (PT) Charna Archer, [name]    Onset Date/Surgical Date 10/30/20                           [facility] Adult PT Treatment/Exercise - 08/26/21 0001       Neck Exercises: Machines for Strengthening   UBE (Upper Arm Bike) L2 x 3 min alt fwd/bkwd      Neck Exercises: Seated   Cervical Rotation Left;Right;5 reps   with head nods   Lateral Flexion Right;Left;5 reps    Upper Extremity D2 10 reps;Flexion    UE D2 Limitations bilat with cues for posture    Other Seated Exercise bilat ER red TB x 10, horiz abd red TB x 10      Neck Exercises: Stretches   Levator Stretch Right;Left;2 reps;20 seconds    Other Neck Stretches scalene stretch with towel anchor over shoulder x 3 reps of 20 sec each.      Moist Heat Therapy   Number Minutes Moist Heat 10 Minutes    Moist Heat Location Cervical      Electrical Stimulation   Electrical Stimulation Location cervical    Electrical Stimulation Action TENS    Electrical Stimulation Parameters to tolerance  Electrical Stimulation Goals Pain      Manual Therapy   Joint Mobilization cervical distraction, PAs and lateral glides to improve ROM    Soft tissue mobilization STM to bilat upper trap, scalenes, levator, cervical paraspinals.                          PT Long Term Goals - 08/05/21 1235       PT LONG TERM GOAL #1   Title Pt will be independent with HEP    Time 6    Period Weeks    Status New    Target Date 09/16/21      PT LONG TERM GOAL #2   Title Pt will improve bilat UE strength to 4+/5 to tolerate yard work with decreased pain    Time 6    Period Weeks    Status New    Target Date 09/16/21      PT LONG TERM GOAL #3   Title Pt will improve cervical sidebending and rotation ROM by 10 degrees to perform IADLs with decreased pain    Time 6    Period Weeks    Status New    Target Date 09/16/21                   Plan - 08/26/21 6734     Clinical Impression Statement Pt with decreased Lt  scalene muscle spasticity this session. Still demos limited sidebending and rotation ROM. She had "clicking" in Lt shoulder with theraband rows and extensions and requested to hold those exercises. Pt did well with seated bilat ER and diagonals without resistance    PT Next Visit Plan cervical mobility, manual/modalties, postural strength    PT Home Exercise Plan [id number]    Consulted and Agree with Plan of Care Patient             Patient will benefit from skilled therapeutic intervention in order to improve the following deficits and impairments:     Visit Diagnosis: Impaired sensation  Abnormal posture  Muscle weakness (generalized)     Problem List Patient Active Problem List   Diagnosis Date Noted   Hypotension 05/26/2021   Neck pain 05/26/2021   Atherosclerosis 03/19/2020   CHF (congestive heart failure) ([place]) 11/06/2019   Other specified disorders of kidney and ureter 04/05/2019   Renal insufficiency 04/05/2019   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/05/2019   Cervicalgia 04/05/2019   Eustachian tube dysfunction, bilateral 04/05/2019   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   White matter abnormality on MRI of brain 04/05/2019   Elevated creatine kinase 12/14/2018   Myalgia due to statin 12/14/2018   Normocytic anemia 12/14/2018   Acute reaction to situational stress 11/30/2018   At moderate risk for fall 10/03/2018   Bilateral renal cysts 10/03/2018   Facet arthropathy, lumbosacral 10/03/2018   Microscopic hematuria 10/03/2018   Lacunar infarction ([place]) 07/20/2018   Craniofacial pain 07/04/2018   Proteinuria 03/21/2018   Hematuria 03/21/2018   Controlled type 2 diabetes mellitus without complication, without long-term current use of insulin ([place]) 03/08/2018   Encounter for weight management 01/06/2018   Urge incontinence 08/23/2017   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/23/2017   Urinary frequency 08/17/2017   Overactive bladder 08/17/2017    Equinus contracture of ankle 07/27/2017   Hammer toes of both feet 07/27/2017   Dermatophytosis, nail 07/27/2017   Calcaneal spur of both feet 07/27/2017   Dysuria 07/07/2017   Atypical chest pain  06/10/2017   Aortic atherosclerosis ([place]) 06/10/2017   Cardiomegaly 06/10/2017   Vitreous detachment of right eye 05/12/2017   Combined form of age-related cataract, both eyes 04/07/2017   Dermatochalasis of both eyelids 04/07/2017   Posterior vitreous detachment, right eye 04/07/2017   Acute left-sided low back pain without sciatica 03/09/2017   Postural kyphosis of cervicothoracic region 03/09/2017   Lumbar degenerative disc disease 03/09/2017   Primary osteoarthritis of left knee 03/04/2017   Chronic pain of left knee 03/04/2017   Bilateral primary osteoarthritis of knee 12/15/2016   Bilateral lower extremity edema 12/15/2016   Allergy to honey bee venom 12/15/2016   Essential hypertension 02/16/2016   DM type 2 with diabetic dyslipidemia ([place]) 02/10/2016   Gastroesophageal reflux disease without esophagitis 02/10/2016   Breast pain, left 12/30/2015   Hypothyroidism, 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   Diabetic sensorimotor polyneuropathy ([place]) 10/27/2015   Papillary thyroid carcinoma ([place]) 08/09/2014   Post-menopausal atrophic vaginitis 06/24/2014   Left thyroid nodule 01/29/2014   Nodule of left lung 01/21/2014   Class 1 obesity due to excess calories with serious comorbidity and body mass index (BMI) of 30.0 to 30.9 in adult 06/12/2012   Palpitations 11/29/2011   Abnormal mammogram of right breast 08/30/2011   Arthritis of left knee 05/24/2011   Coronary artery disease involving native coronary artery of native heart with angina pectoris ([place]) 05/24/2011   History of acute myocardial infarction of inferior wall 05/24/2011    Saif Peter, PT 08/26/2021, 8:40 AM  [facility] [place], [place], [address] Phone:  [phone]   Fax:  [phone]  Name: Zali Kamaka MRN: [id number] Date of Birth: 02/10/1951

## 2021-08-26 NOTE — Addendum Note (Signed)
Addended by: Beverlee Nims on: 08/26/2021 02:02 PM   Modules accepted: Orders

## 2021-08-28 ENCOUNTER — Ambulatory Visit: Payer: Medicare Other | Admitting: Physical Therapy

## 2021-08-28 ENCOUNTER — Other Ambulatory Visit: Payer: Self-pay

## 2021-08-28 DIAGNOSIS — R201 Hypoesthesia of skin: Secondary | ICD-10-CM | POA: Diagnosis not present

## 2021-08-28 DIAGNOSIS — R293 Abnormal posture: Secondary | ICD-10-CM

## 2021-08-28 DIAGNOSIS — M6281 Muscle weakness (generalized): Secondary | ICD-10-CM

## 2021-08-28 NOTE — Therapy (Signed)
[place], [place], [address] Phone: [phone]   Fax:  [phone]  Physical Therapy Treatment  Patient Details  Name: Sara Gardner MRN: [id number] Date of Birth: 1950/08/24 Referring Provider (PT): Samuel Bouche   Encounter Date: 08/28/2021   PT End of Session - 08/28/21 0839     Visit Number 6    Number of Visits 12    Date for PT Re-Evaluation 09/16/21    PT Start Time 0800    PT Stop Time 0845    PT Time Calculation (min) 45 min    Activity Tolerance Patient tolerated treatment well    Behavior During Therapy [facility] for tasks assessed/performed             Past Medical History:  Diagnosis Date   Aortic atherosclerosis ([place]) 06/10/2017   CAD (coronary artery disease)    Cancer ([place])    Cardiomegaly 06/10/2017   Combined form of age-related cataract, both eyes 04/07/2017   Dermatochalasis of both eyelids 04/07/2017   Diabetes mellitus without complication (HCC)    Diastolic heart failure, NYHA class 1 ([place])    GERD (gastroesophageal reflux disease)    Hypertension    Lacunar infarction ([place]) 07/20/2018   Left renal mass 10/04/2018   1.7 cm hypoechoic mass 10/04/2018   Lumbar degenerative disc disease 03/09/2017   Myocardial infarction ([place]) 2005   Normocytic anemia 12/14/2018   Papillary thyroid carcinoma ([place])    Posterior vitreous detachment, right eye 04/07/2017   Thyroid disease     Past Surgical History:  Procedure Laterality Date   TOTAL THYROIDECTOMY  2016    There were no vitals filed for this visit.   Subjective Assessment - 08/28/21 0803     Subjective Pt states she slept funny and is a little "sore". but that overall she feels "ok"    Patient Stated Goals reduce tingling/burning    Currently in Pain? Yes    Pain Score 3     Pain Location Neck    Pain Orientation Left    Pain Descriptors / Indicators Sore                [facility] PT Assessment - 08/28/21  0001       Assessment   Medical Diagnosis parasthesias    Referring Provider (PT) Charna Archer, [name]    Onset Date/Surgical Date 10/30/20      AROM   Cervical - Right Side Bend 35    Cervical - Left Side Bend 35    Cervical - Right Rotation 35    Cervical - Left Rotation 30                           [facility] Adult PT Treatment/Exercise - 08/28/21 0001       Neck Exercises: Machines for Strengthening   UBE (Upper Arm Bike) L2 x 3 min alt fwd/bkwd      Neck Exercises: Theraband   Shoulder Extension 20 reps;Red    Rows Red;20 reps    Other Theraband Exercises bow and arrow red TB x 10 bilat      Neck Exercises: Seated   Cervical Rotation Left;Right;5 reps   with head nods   Lateral Flexion Right;Left;5 reps      Neck Exercises: Supine   Upper Extremity D2 Flexion;10 reps;Theraband    Theraband Level (UE D2) Level 1 ([place])      Manual Therapy   Joint  Mobilization cervical distraction, PAs and lateral glides to improve ROM    Soft tissue mobilization STM sclanes, SCM, upper trap                          PT Long Term Goals - 08/05/21 1235       PT LONG TERM GOAL #1   Title Pt will be independent with HEP    Time 6    Period Weeks    Status New    Target Date 09/16/21      PT LONG TERM GOAL #2   Title Pt will improve bilat UE strength to 4+/5 to tolerate yard work with decreased pain    Time 6    Period Weeks    Status New    Target Date 09/16/21      PT LONG TERM GOAL #3   Title Pt will improve cervical sidebending and rotation ROM by 10 degrees to perform IADLs with decreased pain    Time 6    Period Weeks    Status New    Target Date 09/16/21                   Plan - 08/28/21 0839     Clinical Impression Statement Pt with improved sidebending ROM, still limited in cervical rotation. Focused on STM and jt mobility to improve rotation with improved ROM at end of session    PT Next Visit Plan cervical mobility,  manual/modalties, postural strength    PT Home Exercise Plan [id number]    Consulted and Agree with Plan of Care Patient             Patient will benefit from skilled therapeutic intervention in order to improve the following deficits and impairments:     Visit Diagnosis: Impaired sensation  Abnormal posture  Muscle weakness (generalized)     Problem List Patient Active Problem List   Diagnosis Date Noted   Hypotension 05/26/2021   Neck pain 05/26/2021   Atherosclerosis 03/19/2020   CHF (congestive heart failure) ([place]) 11/06/2019   Other specified disorders of kidney and ureter 04/05/2019   Renal insufficiency 04/05/2019   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/05/2019   Cervicalgia 04/05/2019   Eustachian tube dysfunction, bilateral 04/05/2019   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   White matter abnormality on MRI of brain 04/05/2019   Elevated creatine kinase 12/14/2018   Myalgia due to statin 12/14/2018   Normocytic anemia 12/14/2018   Acute reaction to situational stress 11/30/2018   At moderate risk for fall 10/03/2018   Bilateral renal cysts 10/03/2018   Facet arthropathy, lumbosacral 10/03/2018   Microscopic hematuria 10/03/2018   Lacunar infarction ([place]) 07/20/2018   Craniofacial pain 07/04/2018   Proteinuria 03/21/2018   Hematuria 03/21/2018   Controlled type 2 diabetes mellitus without complication, without long-term current use of insulin ([place]) 03/08/2018   Encounter for weight management 01/06/2018   Urge incontinence 08/23/2017   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/23/2017   Urinary frequency 08/17/2017   Overactive bladder 08/17/2017   Equinus contracture of ankle 07/27/2017   Hammer toes of both feet 07/27/2017   Dermatophytosis, nail 07/27/2017   Calcaneal spur of both feet 07/27/2017   Dysuria 07/07/2017   Atypical chest pain 06/10/2017   Aortic atherosclerosis ([place]) 06/10/2017   Cardiomegaly 06/10/2017   Vitreous  detachment of right eye 05/12/2017   Combined form of age-related cataract, both eyes 04/07/2017   Dermatochalasis of both eyelids 04/07/2017  Posterior vitreous detachment, right eye 04/07/2017   Acute left-sided low back pain without sciatica 03/09/2017   Postural kyphosis of cervicothoracic region 03/09/2017   Lumbar degenerative disc disease 03/09/2017   Primary osteoarthritis of left knee 03/04/2017   Chronic pain of left knee 03/04/2017   Bilateral primary osteoarthritis of knee 12/15/2016   Bilateral lower extremity edema 12/15/2016   Allergy to honey bee venom 12/15/2016   Essential hypertension 02/16/2016   DM type 2 with diabetic dyslipidemia ([place]) 02/10/2016   Gastroesophageal reflux disease without esophagitis 02/10/2016   Breast pain, left 12/30/2015   Hypothyroidism, 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   Diabetic sensorimotor polyneuropathy ([place]) 10/27/2015   Papillary thyroid carcinoma ([place]) 08/09/2014   Post-menopausal atrophic vaginitis 06/24/2014   Left thyroid nodule 01/29/2014   Nodule of left lung 01/21/2014   Class 1 obesity due to excess calories with serious comorbidity and body mass index (BMI) of 30.0 to 30.9 in adult 06/12/2012   Palpitations 11/29/2011   Abnormal mammogram of right breast 08/30/2011   Arthritis of left knee 05/24/2011   Coronary artery disease involving native coronary artery of native heart with angina pectoris ([place]) 05/24/2011   History of acute myocardial infarction of inferior wall 05/24/2011    Bassheva Flury, PT 08/28/2021, 8:40 AM  [facility] [place], [place], [address] Phone: [phone]   Fax:  [phone]  Name: Lindsea Olivar MRN: [id number] Date of Birth: 1951/01/02

## 2021-09-02 ENCOUNTER — Ambulatory Visit: Payer: Medicare Other | Admitting: Physical Therapy

## 2021-09-02 ENCOUNTER — Encounter: Payer: Self-pay | Admitting: Physical Therapy

## 2021-09-02 ENCOUNTER — Other Ambulatory Visit: Payer: Self-pay

## 2021-09-02 DIAGNOSIS — R201 Hypoesthesia of skin: Secondary | ICD-10-CM

## 2021-09-02 DIAGNOSIS — R293 Abnormal posture: Secondary | ICD-10-CM

## 2021-09-02 DIAGNOSIS — M6281 Muscle weakness (generalized): Secondary | ICD-10-CM

## 2021-09-02 NOTE — Therapy (Signed)
[place], [place], [address] Phone: [phone]   Fax:  [phone]  Physical Therapy Treatment  Patient Details  Name: Sara Gardner MRN: [id number] Date of Birth: 03/04/1951 Referring Provider (PT): Samuel Bouche   Encounter Date: 09/02/2021   PT End of Session - 09/02/21 1149     Visit Number 7    Number of Visits 12    Date for PT Re-Evaluation 09/16/21    Authorization Type UHC    PT Start Time 7001    PT Stop Time 1232    PT Time Calculation (min) 47 min    Activity Tolerance Patient tolerated treatment well    Behavior During Therapy [facility] for tasks assessed/performed             Past Medical History:  Diagnosis Date   Aortic atherosclerosis ([place]) 06/10/2017   CAD (coronary artery disease)    Cancer ([place])    Cardiomegaly 06/10/2017   Combined form of age-related cataract, both eyes 04/07/2017   Dermatochalasis of both eyelids 04/07/2017   Diabetes mellitus without complication (HCC)    Diastolic heart failure, NYHA class 1 ([place])    GERD (gastroesophageal reflux disease)    Hypertension    Lacunar infarction ([place]) 07/20/2018   Left renal mass 10/04/2018   1.7 cm hypoechoic mass 10/04/2018   Lumbar degenerative disc disease 03/09/2017   Myocardial infarction ([place]) 2005   Normocytic anemia 12/14/2018   Papillary thyroid carcinoma ([place])    Posterior vitreous detachment, right eye 04/07/2017   Thyroid disease     Past Surgical History:  Procedure Laterality Date   TOTAL THYROIDECTOMY  2016    There were no vitals filed for this visit.   Subjective Assessment - 09/02/21 1150     Subjective "I'm stiff".  She reports she completes her HEP daily and is sleeping with towel roll in pillow case for support.    She reports that the burning continues.    Diagnostic tests x ray of neck: cervical disc degeneration - moderate    Patient Stated Goals reduce tingling/burning     Currently in Pain? No/denies    Pain Score 0-No pain                [facility] PT Assessment - 09/02/21 0001       Assessment   Medical Diagnosis parasthesias    Referring Provider (PT) Charna Archer, [name]    Onset Date/Surgical Date 10/30/20    Hand Dominance Right    Next MD Visit PRN      AROM   Cervical - Right Rotation 59    Cervical - Left Rotation 35   with pain on Lt                          [facility] Adult PT Treatment/Exercise - 09/02/21 0001       Neck Exercises: Machines for Strengthening   UBE (Upper Arm Bike) L2 x 3 min alt fwd/bkwd      Neck Exercises: Theraband   Shoulder Extension 20 reps;Red    Rows 10 reps;Green   2 sets; 1st set fast, 2nd set holding 3 sec in retraction   Other Theraband Exercises D2 flexion with yellow band x 10 each side.      Neck Exercises: Seated   Cervical Rotation Left;Right;5 reps   with head nods   Lateral Flexion Right;Left;5 reps    Other Seated Exercise shoulder  rolls.      Neck Exercises: Supine   Upper Extremity D2 --    Theraband Level (UE D2) --      Neck Exercises: Stretches   Other Neck Stretches midlevel doorway stretch x 15 sec x 3    Other Neck Stretches seated, leaning forarms on thighs and cervical diagonals and flexion for ROM      Moist Heat Therapy   Number Minutes Moist Heat 15 Minutes    Moist Heat Location Cervical      Electrical Stimulation   Electrical Stimulation Location bilat cervical paraspinals, bilat upper trap    Electrical Stimulation Action IFC    Electrical Stimulation Parameters 15 min,m intensity to tolerance    Electrical Stimulation Goals Pain;Tone      Manual Therapy   Manual Therapy Passive ROM;Soft tissue mobilization;Myofascial release;Manual Traction    Soft tissue mobilization STM sclanes, SCM, upper trap    Myofascial Release suboccipital release    Passive ROM lat flexion and rotation of neck.    Manual Traction cervical, 15 sec holds x 4 reps                           PT Long Term Goals - 09/02/21 1240       PT LONG TERM GOAL #1   Title Pt will be independent with HEP    Time 6    Period Weeks    Status On-going    Target Date 09/16/21      PT LONG TERM GOAL #2   Title Pt will improve bilat UE strength to 4+/5 to tolerate yard work with decreased pain    Time 6    Period Weeks    Status On-going    Target Date 09/16/21      PT LONG TERM GOAL #3   Title Pt will improve cervical sidebending and rotation ROM by 10 degrees to perform IADLs with decreased pain    Time 6    Period Weeks    Status Partially Met    Target Date 09/16/21                   Plan - 09/02/21 1203     Clinical Impression Statement Improved Rt cervical rotation; Lt continues to be limited and painful.   She tolerated exercises well, reporting some pain with head nods in Lt rotation.  Pt making gradual progress towards goals.    Rehab Potential Good    PT Frequency 2x / week    PT Duration 6 weeks    PT Treatment/Interventions Taping;Dry needling;Manual techniques;Passive range of motion;Patient/family education;Therapeutic exercise;Therapeutic activities;Neuromuscular re-education;Traction;Moist Heat;Electrical Stimulation;Cryotherapy    PT Next Visit Plan cervical mobility, manual/modalties, postural strength; MMT UE for LTG#2    PT Home Exercise Plan [id number]    Consulted and Agree with Plan of Care Patient             Patient will benefit from skilled therapeutic intervention in order to improve the following deficits and impairments:  Pain, Postural dysfunction, Decreased strength, Decreased activity tolerance, Decreased range of motion, Hypomobility  Visit Diagnosis: Impaired sensation  Abnormal posture  Muscle weakness (generalized)     Problem List Patient Active Problem List   Diagnosis Date Noted   Hypotension 05/26/2021   Neck pain 05/26/2021   Atherosclerosis 03/19/2020   CHF (congestive heart failure)  ([place]) 11/06/2019   Other specified disorders of kidney and ureter 04/05/2019   Renal  insufficiency 04/05/2019   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/05/2019   Cervicalgia 04/05/2019   Eustachian tube dysfunction, bilateral 04/05/2019   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[place]) 04/05/2019   White matter abnormality on MRI of brain 04/05/2019   Elevated creatine kinase 12/14/2018   Myalgia due to statin 12/14/2018   Normocytic anemia 12/14/2018   Acute reaction to situational stress 11/30/2018   At moderate risk for fall 10/03/2018   Bilateral renal cysts 10/03/2018   Facet arthropathy, lumbosacral 10/03/2018   Microscopic hematuria 10/03/2018   Lacunar infarction ([place]) 07/20/2018   Craniofacial pain 07/04/2018   Proteinuria 03/21/2018   Hematuria 03/21/2018   Controlled type 2 diabetes mellitus without complication, without long-term current use of insulin ([place]) 03/08/2018   Encounter for weight management 01/06/2018   Urge incontinence 08/23/2017   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/23/2017   Urinary frequency 08/17/2017   Overactive bladder 08/17/2017   Equinus contracture of ankle 07/27/2017   Hammer toes of both feet 07/27/2017   Dermatophytosis, nail 07/27/2017   Calcaneal spur of both feet 07/27/2017   Dysuria 07/07/2017   Atypical chest pain 06/10/2017   Aortic atherosclerosis () 06/10/2017   Cardiomegaly 06/10/2017   Vitreous detachment of right eye 05/12/2017   Combined form of age-related cataract, both eyes 04/07/2017   Dermatochalasis of both eyelids 04/07/2017   Posterior vitreous detachment, right eye 04/07/2017   Acute left-sided low back pain without sciatica 03/09/2017   Postural kyphosis of cervicothoracic region 03/09/2017   Lumbar degenerative disc disease 03/09/2017   Primary osteoarthritis of left knee 03/04/2017   Chronic pain of left knee 03/04/2017   Bilateral primary osteoarthritis of knee 12/15/2016   Bilateral lower extremity edema  12/15/2016   Allergy to honey bee venom 12/15/2016   Essential hypertension 02/16/2016   DM type 2 with diabetic dyslipidemia ([place]) 02/10/2016   Gastroesophageal reflux disease without esophagitis 02/10/2016   Breast pain, left 12/30/2015   Hypothyroidism, 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   Diabetic sensorimotor polyneuropathy ([place]) 10/27/2015   Papillary thyroid carcinoma ([place]) 08/09/2014   Post-menopausal atrophic vaginitis 06/24/2014   Left thyroid nodule 01/29/2014   Nodule of left lung 01/21/2014   Class 1 obesity due to excess calories with serious comorbidity and body mass index (BMI) of 30.0 to 30.9 in adult 06/12/2012   Palpitations 11/29/2011   Abnormal mammogram of right breast 08/30/2011   Arthritis of left knee 05/24/2011   Coronary artery disease involving native coronary artery of native heart with angina pectoris ([place]) 05/24/2011   History of acute myocardial infarction of inferior wall 05/24/2011   Kerin Perna, PTA 09/02/21 12:41 PM  [place], [place], [address] Phone: [phone]   Fax:  [phone]  Name: Sara Gardner MRN: [id number] Date of Birth: 1951-05-29

## 2021-09-04 ENCOUNTER — Other Ambulatory Visit: Payer: Self-pay

## 2021-09-04 ENCOUNTER — Ambulatory Visit: Payer: Medicare Other | Admitting: Physical Therapy

## 2021-09-04 ENCOUNTER — Encounter: Payer: Self-pay | Admitting: Physical Therapy

## 2021-09-04 DIAGNOSIS — R293 Abnormal posture: Secondary | ICD-10-CM

## 2021-09-04 DIAGNOSIS — R201 Hypoesthesia of skin: Secondary | ICD-10-CM | POA: Diagnosis not present

## 2021-09-04 DIAGNOSIS — M6281 Muscle weakness (generalized): Secondary | ICD-10-CM

## 2021-09-04 NOTE — Therapy (Signed)
[place], [place], [address] Phone: [phone]   Fax:  [phone]  Physical Therapy Treatment  Patient Details  Name: Sara Gardner MRN: [id number] Date of Birth: Nov 23, 1950 Referring Provider (PT): Samuel Bouche   Encounter Date: 09/04/2021   PT End of Session - 09/04/21 0836     Visit Number 8    Number of Visits 12    Date for PT Re-Evaluation 09/16/21    Authorization Type UHC    PT Start Time 0802    PT Stop Time 0848    PT Time Calculation (min) 46 min    Activity Tolerance Patient tolerated treatment well    Behavior During Therapy [facility] for tasks assessed/performed             Past Medical History:  Diagnosis Date   Aortic atherosclerosis ([place]) 06/10/2017   CAD (coronary artery disease)    Cancer ([place])    Cardiomegaly 06/10/2017   Combined form of age-related cataract, both eyes 04/07/2017   Dermatochalasis of both eyelids 04/07/2017   Diabetes mellitus without complication (HCC)    Diastolic heart failure, NYHA class 1 ([place])    GERD (gastroesophageal reflux disease)    Hypertension    Lacunar infarction ([place]) 07/20/2018   Left renal mass 10/04/2018   1.7 cm hypoechoic mass 10/04/2018   Lumbar degenerative disc disease 03/09/2017   Myocardial infarction ([place]) 2005   Normocytic anemia 12/14/2018   Papillary thyroid carcinoma ([place])    Posterior vitreous detachment, right eye 04/07/2017   Thyroid disease     Past Surgical History:  Procedure Laterality Date   TOTAL THYROIDECTOMY  2016    There were no vitals filed for this visit.   Subjective Assessment - 09/04/21 0818     Subjective Pt reports she didn't sleep well, and neck feels tight and painful.    Diagnostic tests x ray of neck: cervical disc degeneration - moderate    Patient Stated Goals reduce tingling/burning    Currently in Pain? Yes    Pain Score 8     Pain Location Neck    Pain Orientation Left;Right                 [facility] PT Assessment - 09/04/21 0001       Assessment   Medical Diagnosis parasthesias    Referring Provider (PT) Charna Archer, [name]    Onset Date/Surgical Date 10/30/20    Hand Dominance Right    Next MD Visit PRN               [facility] Adult PT Treatment/Exercise - 09/04/21 0001       Neck Exercises: Machines for Strengthening   UBE (Upper Arm Bike) L3 x 3 min alt fwd/bkwd   standing     Neck Exercises: Theraband   Shoulder Extension 20 reps;Red    Rows 10 reps;Green   2 sets; 1st set fast, 2nd set holding 3 sec in retraction   Other Theraband Exercises D2 flexion with yellow band x 5 each side. standing in front of mirror for postural feedback      Neck Exercises: Seated   Other Seated Exercise shoulder rolls x 10      Neck Exercises: Supine   Cervical Rotation Right;Left;5 reps   with head nods   Shoulder Flexion Both;5 reps   holding yellow band   Other Supine Exercise bilat horiz abdct with yellow x 8, with red x 8.  Neck Exercises: Sidelying   Other Sidelying Exercise open book x 10 reps each side, cues to slow speed and for form.      Neck Exercises: Stretches   Upper Trapezius Stretch Right;Left;1 rep;10 seconds    Other Neck Stretches midlevel doorway stretch x 15 sec x 3      Moist Heat Therapy   Number Minutes Moist Heat 15 Minutes    Moist Heat Location Cervical      Electrical Stimulation   Electrical Stimulation Location bilat cervical paraspinals, bilat upper trap    Electrical Stimulation Action IFC    Electrical Stimulation Parameters 15 min, intensity to tolerance.    Electrical Stimulation Goals Tone;Pain                          PT Long Term Goals - 09/02/21 1240       PT LONG TERM GOAL #1   Title Pt will be independent with HEP    Time 6    Period Weeks    Status On-going    Target Date 09/16/21      PT LONG TERM GOAL #2   Title Pt will improve bilat UE strength to 4+/5 to tolerate yard work with  decreased pain    Time 6    Period Weeks    Status On-going    Target Date 09/16/21      PT LONG TERM GOAL #3   Title Pt will improve cervical sidebending and rotation ROM by 10 degrees to perform IADLs with decreased pain    Time 6    Period Weeks    Status Partially Met    Target Date 09/16/21                   Plan - 09/04/21 0837     Clinical Impression Statement Pt tolerated exercises well, despite elevated pain level upon arrival.  She required minor cues for improved form for doorway pec stretch.  She reported reduction of tightness after exercises and reduction of pain after MHP/estim. Pt making gradual progress towards LTGs and will benefit from continued PT intervention to maximize mobility with less pain.    Rehab Potential Good    PT Frequency 2x / week    PT Duration 6 weeks    PT Treatment/Interventions Taping;Dry needling;Manual techniques;Passive range of motion;Patient/family education;Therapeutic exercise;Therapeutic activities;Neuromuscular re-education;Traction;Moist Heat;Electrical Stimulation;Cryotherapy    PT Next Visit Plan cervical mobility, manual/modalties, postural strength    PT Home Exercise Plan [email or web address]    Consulted and Agree with Plan of Care Patient             Patient will benefit from skilled therapeutic intervention in order to improve the following deficits and impairments:  Pain, Postural dysfunction, Decreased strength, Decreased activity tolerance, Decreased range of motion, Hypomobility  Visit Diagnosis: Abnormal posture  Muscle weakness (generalized)  Impaired sensation     Problem List Patient Active Problem List   Diagnosis Date Noted   Hypotension 05/26/2021   Neck pain 05/26/2021   Atherosclerosis 03/19/2020   CHF (congestive heart failure) (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[place]) 04/05/2019   White matter abnormality on MRI of brain 04/05/2019   Elevated creatine kinase 12/14/2018   Myalgia due to statin 12/14/2018  Normocytic anemia 12/14/2018   Acute reaction to situational stress 11/30/2018   At moderate risk for fall 10/03/2018   Bilateral renal cysts 10/03/2018   Facet arthropathy, lumbosacral 10/03/2018   Microscopic hematuria 10/03/2018   Lacunar infarction ([place]) 07/20/2018   Craniofacial pain 07/04/2018   Proteinuria 03/21/2018   Hematuria 03/21/2018   Controlled type 2 diabetes mellitus without complication, without long-term current use of insulin ([place]) 03/08/2018   Encounter for weight management 01/06/2018   Urge incontinence 08/23/2017   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/23/2017   Urinary frequency 08/17/2017   Overactive bladder 08/17/2017   Equinus contracture of ankle 07/27/2017   Hammer toes of both feet 07/27/2017   Dermatophytosis, nail 07/27/2017   Calcaneal spur of both feet 07/27/2017   Dysuria 07/07/2017   Atypical chest pain 06/10/2017   Aortic atherosclerosis ([place]) 06/10/2017   Cardiomegaly 06/10/2017   Vitreous detachment of right eye 05/12/2017   Combined form of age-related cataract, both eyes 04/07/2017   Dermatochalasis of both eyelids 04/07/2017   Posterior vitreous detachment, right eye 04/07/2017   Acute left-sided low back pain without sciatica 03/09/2017   Postural kyphosis of cervicothoracic region 03/09/2017   Lumbar degenerative disc disease 03/09/2017   Primary osteoarthritis of left knee 03/04/2017   Chronic pain of left knee 03/04/2017   Bilateral primary osteoarthritis of knee 12/15/2016   Bilateral lower extremity edema 12/15/2016   Allergy to honey bee venom 12/15/2016   Essential hypertension 02/16/2016   DM type 2 with diabetic dyslipidemia ([place]) 02/10/2016   Gastroesophageal reflux  disease without esophagitis 02/10/2016   Breast pain, left 12/30/2015   Hypothyroidism, 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   Diabetic sensorimotor polyneuropathy ([place]) 10/27/2015   Papillary thyroid carcinoma ([place]) 08/09/2014   Post-menopausal atrophic vaginitis 06/24/2014   Left thyroid nodule 01/29/2014   Nodule of left lung 01/21/2014   Class 1 obesity due to excess calories with serious comorbidity and body mass index (BMI) of 30.0 to 30.9 in adult 06/12/2012   Palpitations 11/29/2011   Abnormal mammogram of right breast 08/30/2011   Arthritis of left knee 05/24/2011   Coronary artery disease involving native coronary artery of native heart with angina pectoris ([place]) 05/24/2011   History of acute myocardial infarction of inferior wall 05/24/2011   Kerin Perna, PTA 09/04/21 8:43 AM  [place], [place], [address] Phone: [phone]   Fax:  [phone]  Name: Sara Gardner MRN: [id number] Date of Birth: 1950/09/23

## 2021-09-06 ENCOUNTER — Other Ambulatory Visit: Payer: Self-pay

## 2021-09-06 ENCOUNTER — Encounter (HOSPITAL_COMMUNITY): Payer: Self-pay | Admitting: *Deleted

## 2021-09-06 ENCOUNTER — Emergency Department (HOSPITAL_COMMUNITY)
Admission: EM | Admit: 2021-09-06 | Discharge: 2021-09-06 | Disposition: A | Discharge status: Home / Self Care | Payer: Medicare Other | Attending: Emergency Medicine | Admitting: Emergency Medicine

## 2021-09-06 ENCOUNTER — Emergency Department (HOSPITAL_COMMUNITY): Payer: Medicare Other

## 2021-09-06 DIAGNOSIS — I110 Hypertensive heart disease with heart failure: Secondary | ICD-10-CM | POA: Insufficient documentation

## 2021-09-06 DIAGNOSIS — E119 Type 2 diabetes mellitus without complications: Secondary | ICD-10-CM | POA: Insufficient documentation

## 2021-09-06 DIAGNOSIS — Z87891 Personal history of nicotine dependence: Secondary | ICD-10-CM | POA: Diagnosis not present

## 2021-09-06 DIAGNOSIS — Z859 Personal history of malignant neoplasm, unspecified: Secondary | ICD-10-CM | POA: Insufficient documentation

## 2021-09-06 DIAGNOSIS — R079 Chest pain, unspecified: Secondary | ICD-10-CM

## 2021-09-06 DIAGNOSIS — I509 Heart failure, unspecified: Secondary | ICD-10-CM | POA: Insufficient documentation

## 2021-09-06 DIAGNOSIS — I2510 Atherosclerotic heart disease of native coronary artery without angina pectoris: Secondary | ICD-10-CM | POA: Diagnosis not present

## 2021-09-06 LAB — CBC
HCT: 34.6 % — ABNORMAL LOW (ref 36.0–46.0)
Hemoglobin: 11.1 g/dL — ABNORMAL LOW (ref 12.0–15.0)
MCH: 26.9 pg (ref 26.0–34.0)
MCHC: 32.1 g/dL (ref 30.0–36.0)
MCV: 83.8 fL (ref 80.0–100.0)
Platelets: 235 10*3/uL (ref 150–400)
RBC: 4.13 MIL/uL (ref 3.87–5.11)
RDW: 15.9 % — ABNORMAL HIGH (ref 11.5–15.5)
WBC: 5.8 10*3/uL (ref 4.0–10.5)
nRBC: 0 % (ref 0.0–0.2)

## 2021-09-06 LAB — BASIC METABOLIC PANEL
Anion gap: 10 (ref 5–15)
BUN: 18 mg/dL (ref 8–23)
CO2: 27 mmol/L (ref 22–32)
Calcium: 9.3 mg/dL (ref 8.9–10.3)
Chloride: 100 mmol/L (ref 98–111)
Creatinine, Ser: 1.16 mg/dL — ABNORMAL HIGH (ref 0.44–1.00)
GFR, Estimated: 50 mL/min — ABNORMAL LOW (ref >60)
Glucose, Bld: 120 mg/dL — ABNORMAL HIGH (ref 70–99)
Potassium: 3.4 mmol/L — ABNORMAL LOW (ref 3.5–5.1)
Sodium: 137 mmol/L (ref 135–145)

## 2021-09-06 LAB — TROPONIN I (HIGH SENSITIVITY): Troponin I (High Sensitivity): 5 ng/L (ref <18)

## 2021-09-06 NOTE — ED Triage Notes (Signed)
The pt arrived by  [facility] from her house  chest pain tonight she took an expired nitro and she felt like she received  relief from the nitro.  She told the paramedics that she had someone come and place elextro magnet fields inside her house  she would not allow me to start her iv

## 2021-09-06 NOTE — ED Provider Notes (Signed)
[facility] Emergency Department Provider Note MRN:  [id number]  Arrival date & time: 09/06/21     Chief Complaint   Chest pain History of Present Illness   Sara Gardner is a 71 y.o. year-old female with a history of CAD presenting to the ED with chief complaint of chest pain.  Patient became "upset" this evening and then she took a tablet of nitroglycerin.  She was told by EMS that the nitroglycerin was expired.  She did have some chest pain this evening.  Otherwise, she explains that she does not want to repeat herself and so refuses to answer more in-depth questions.  Review of Systems  A thorough review of systems was obtained and all systems are negative except as noted in the HPI and PMH.   Patient's Health History    Past Medical History:  Diagnosis Date   Aortic atherosclerosis ([place]) 06/10/2017   CAD (coronary artery disease)    Cancer ([place])    Cardiomegaly 06/10/2017   Combined form of age-related cataract, both eyes 04/07/2017   Dermatochalasis of both eyelids 04/07/2017   Diabetes mellitus without complication (HCC)    Diastolic heart failure, NYHA class 1 (HCC)    GERD (gastroesophageal reflux disease)    Hypertension    Lacunar infarction ([place]) 07/20/2018   Left renal mass 10/04/2018   1.7 cm hypoechoic mass 10/04/2018   Lumbar degenerative disc disease 03/09/2017   Myocardial infarction ([place]) 2005   Normocytic anemia 12/14/2018   Papillary thyroid carcinoma (HCC)    Posterior vitreous detachment, right eye 04/07/2017   Thyroid disease     Past Surgical History:  Procedure Laterality Date   TOTAL THYROIDECTOMY  2016    Family History  Problem Relation Age of Onset   Hypertension Mother    Hypertension Sister    Hypertension Brother    Breast cancer Daughter    Hypertension Sister     Social History   Socioeconomic History   Marital status: Single    Spouse name: Not on file   Number of children: 1   Years of education:  Not on file   Highest education level: Not on file  Occupational History   Not on file  Tobacco Use   Smoking status: Former   Smokeless tobacco: Never  Vaping Use   Vaping Use: Never used  Substance and Sexual Activity   Alcohol use: No   Drug use: No   Sexual activity: Not Currently  Other Topics Concern   Not on file  Social History Narrative   Not on file   Social Determinants of Health   Financial Resource Strain: Not on file  Food Insecurity: Not on file  Transportation Needs: Not on file  Physical Activity: Not on file  Stress: Not on file  Social Connections: Not on file  Intimate Partner Violence: Not on file     Physical Exam   Vitals:   09/06/21 0209  BP: (!) 134/93  Pulse: 91  Resp: 16  Temp: 98.3 F (36.8 C)  SpO2: 98%    CONSTITUTIONAL: Well-appearing, NAD NEURO/PSYCH:  Alert and oriented x 3, no focal deficits EYES:  eyes equal and reactive ENT/NECK:  no LAD, no JVD CARDIO: Regular rate, well-perfused, normal S1 and S2 PULM:  CTAB no wheezing or rhonchi GI/GU:  non-distended, non-tender MSK/SPINE:  No gross deformities, no edema SKIN:  no rash, atraumatic   *Additional and/or pertinent findings included in MDM below  Diagnostic and Interventional Summary  EKG Interpretation  Date/Time:  September 06, 2021 Ventricular Rate:  75 PR Interval:  116 QRS Duration: 95 QT Interval:  390 QTC Calculation: 424 R Axis:     Text Interpretation: Sinus rhythm       Labs Reviewed  CBC - Abnormal; Notable for the following components:      Result Value   Hemoglobin 11.1 (*)    HCT 34.6 (*)    RDW 15.9 (*)    All other components within normal limits  BASIC METABOLIC PANEL - Abnormal; Notable for the following components:   Potassium 3.4 (*)    Glucose, Bld 120 (*)    Creatinine, Ser 1.16 (*)    GFR, Estimated 50 (*)    All other components within normal limits  TROPONIN I (HIGH SENSITIVITY)  TROPONIN I (HIGH SENSITIVITY)    DG Chest  2 View  Final Result      Medications - No data to display   Procedures  /  Critical Care Procedures  ED Course and Medical Decision Making  Initial Impression and Ddx Chest pain, history of CAD, will need EKG and troponin to evaluate for ACS.  Patient sitting comfortably in overall asking to go home.  There is some concern for some type of psychiatric component to today's visit but she is not answering my questions.  She explains that she got upset this evening.  She denies SI or HI, no AVH.  We will attempt to track down EMS report.  Past medical/surgical history that increases complexity of ED encounter: CAD  Interpretation of Diagnostics I personally reviewed the EKG and my interpretation is as follows: Sinus rhythm without ischemic finding    Labs are reassuring with no significant blood count or electrolyte disturbance, troponin is negative  Patient Reassessment and Ultimate Disposition/Management Patient continues to look and feel well, chest pain started at 6 PM and so no need for repeat troponin.  EMS report is not overly concerning.  Appropriate for discharge.  Patient management required discussion with the following services or consulting groups:  None  Complexity of Problems Addressed Acute illness or injury that poses threat of life of bodily function  Additional Data Reviewed and Analyzed Further history obtained from: EMS on arrival  Additional Factors Impacting ED Encounter Risk Consideration of hospitalization  Barth Kirks. Sedonia Small, MD [place] [email or web address]  Final Clinical Impressions(s) / ED Diagnoses     ICD-10-CM   1. Chest pain  R07.9 DG Chest 2 View    DG Chest 2 View      ED Discharge Orders     None        Discharge Instructions Discussed with and Provided to Patient:     Discharge Instructions      You were evaluated in the Emergency Department and after careful evaluation, we  did not find any emergent condition requiring admission or further testing in the hospital.  Your exam/testing today was overall reassuring.  Please return to the Emergency Department if you experience any worsening of your condition.  Thank you for allowing [place] to be a part of your care.        Maudie Flakes, MD 09/06/21 [phone]

## 2021-09-06 NOTE — Discharge Instructions (Signed)
You were evaluated in the Emergency Department and after careful evaluation, we did not find any emergent condition requiring admission or further testing in the hospital.  Your exam/testing today was overall reassuring.  Please return to the Emergency Department if you experience any worsening of your condition.  Thank you for allowing us to be a part of your care.  

## 2021-09-06 NOTE — ED Notes (Signed)
Pt waiting for her daughter to come and get her from [place]

## 2021-09-06 NOTE — ED Notes (Signed)
Pt alert and oriented.

## 2021-09-07 ENCOUNTER — Telehealth (HOSPITAL_COMMUNITY): Payer: Self-pay | Admitting: *Deleted

## 2021-09-07 ENCOUNTER — Encounter: Payer: Self-pay | Admitting: *Deleted

## 2021-09-07 ENCOUNTER — Ambulatory Visit (INDEPENDENT_AMBULATORY_CARE_PROVIDER_SITE_OTHER): Payer: Medicare Other | Admitting: Cardiology

## 2021-09-07 ENCOUNTER — Telehealth: Payer: Self-pay | Admitting: General Practice

## 2021-09-07 ENCOUNTER — Ambulatory Visit (INDEPENDENT_AMBULATORY_CARE_PROVIDER_SITE_OTHER): Payer: Medicare Other

## 2021-09-07 ENCOUNTER — Encounter: Payer: Self-pay | Admitting: Cardiology

## 2021-09-07 VITALS — BP 109/69 | HR 86 | Ht 62.5 in | Wt 165.8 lb

## 2021-09-07 DIAGNOSIS — I25119 Atherosclerotic heart disease of native coronary artery with unspecified angina pectoris: Secondary | ICD-10-CM

## 2021-09-07 DIAGNOSIS — R0789 Other chest pain: Secondary | ICD-10-CM

## 2021-09-07 DIAGNOSIS — R002 Palpitations: Secondary | ICD-10-CM

## 2021-09-07 DIAGNOSIS — R072 Precordial pain: Secondary | ICD-10-CM

## 2021-09-07 DIAGNOSIS — E7800 Pure hypercholesterolemia, unspecified: Secondary | ICD-10-CM

## 2021-09-07 DIAGNOSIS — I10 Essential (primary) hypertension: Secondary | ICD-10-CM

## 2021-09-07 MED ORDER — NITROGLYCERIN 0.4 MG SL SUBL: SUBLINGUAL_TABLET | SUBLINGUAL | 6 refills | Status: AC

## 2021-09-07 NOTE — Progress Notes (Unsigned)
Enrolled for [employer] to mail a ZIO XT long term holter monitor to the patients address on file.  

## 2021-09-07 NOTE — Patient Instructions (Signed)
Testing/Procedures:  Your physician has requested that you have en exercise stress myoview. For further information please visit [email or web address]. Please follow instruction sheet, as given. [place] Instructions  Your physician has requested you wear a ZIO patch monitor for 14 days.  This is a single patch monitor. [employer] supplies one patch monitor per enrollment. Additional stickers are not available. Please do not apply patch if you will be having a Nuclear Stress Test,  Echocardiogram, Cardiac CT, MRI, or Chest Xray during the period you would be wearing the  monitor. The patch cannot be worn during these tests. You cannot remove and re-apply the  ZIO XT patch monitor.  Your ZIO patch monitor will be mailed 3 day USPS to your address on file. It may take 3-5 days  to receive your monitor after you have been enrolled.  Once you have received your monitor, please review the enclosed instructions. Your monitor  has already been registered assigning a specific monitor serial # to you.  Billing and Patient Assistance Program Information  We have supplied [employer] with any of your insurance information on file for billing purposes. [employer] offers a sliding scale Patient Assistance Program for patients that do not have  insurance, or whose insurance does not completely cover the cost of the ZIO monitor.  You must apply for the Patient Assistance Program to qualify for this discounted rate.  To apply, please call [employer] at [phone], select option 4, select option 2, ask to apply for  Patient Assistance Program. Sara Gardner will ask your household income, and how many people  are in your household. They will quote your out-of-pocket cost based on that information.  [employer] will also be able to set up a 46-month, interest-free payment plan if needed.  Applying the monitor   Shave hair from upper left chest.  Hold abrader disc by orange tab. Rub  abrader in 40 strokes over the upper left chest as  indicated in your monitor instructions.  Clean area with 4 enclosed alcohol pads. Let dry.  Apply patch as indicated in monitor instructions. Patch will be placed under collarbone on left  side of chest with arrow pointing upward.  Rub patch adhesive wings for 2 minutes. Remove white label marked "1". Remove the white  label marked "2". Rub patch adhesive wings for 2 additional minutes.  While looking in a mirror, press and release button in center of patch. A small green light will  flash 3-4 times. This will be your only indicator that the monitor has been turned on.  Do not shower for the first 24 hours. You may shower after the first 24 hours.  Press the button if you feel a symptom. You will hear a small click. Record Date, Time and  Symptom in the Patient Logbook.  When you are ready to remove the patch, follow instructions on the last 2 pages of Patient  Logbook. Stick patch monitor onto the last page of Patient Logbook.  Place Patient Logbook in the blue and white box. Use locking tab on box and tape box closed  securely. The blue and white box has prepaid postage on it. Please place it in the mailbox as  soon as possible. Your physician should have your test results approximately 7 days after the  monitor has been mailed back to [facility].  Call [facility] at [phone] if you have questions regarding  your ZIO XT patch monitor. Call them immediately if  you see an orange light blinking on your  monitor.  If your monitor falls off in less than 4 days, contact our Monitor department at [phone].  If your monitor becomes loose or falls off after 4 days call [employer] at [phone] for  suggestions on securing your monitor    Follow-Up: At [facility], you and your health needs are our priority.  As part of our continuing mission to provide you with exceptional heart care, we have created designated  Provider Care Teams.  These Care Teams include your primary Cardiologist (physician) and Advanced Practice Providers (APPs -  Physician Assistants and Nurse Practitioners) who all work together to provide you with the care you need, when you need it.  We recommend signing up for the patient portal called "MyChart".  Sign up information is provided on this After Visit Summary.  MyChart is used to connect with patients for Virtual Visits (Telemedicine).  Patients are able to view lab/test results, encounter notes, upcoming appointments, etc.  Non-urgent messages can be sent to your provider as well.   To learn more about what you can do with MyChart, go to [email or web address].    Your next appointment:   6 month(s)  The format for your next appointment:   In Person  Provider:   Kirk Ruths, MD    CONTACT THYROID DOCTOR- [place]

## 2021-09-07 NOTE — Telephone Encounter (Signed)
Transition Care Management Unsuccessful Follow-up Telephone Call  Date of discharge and from where:  09/06/21 from [facility]  Attempts:  1st Attempt  Reason for unsuccessful TCM follow-up call:  No answer/busy

## 2021-09-07 NOTE — Telephone Encounter (Signed)
Left message on voicemail per DPR in reference to upcoming appointment scheduled on 09/14/21 at 7:45 with detailed instructions given per Myocardial Perfusion Study Information Sheet for the test. LM to arrive 15 minutes early, and that it is imperative to arrive on time for appointment to keep from having the test rescheduled. If you need to cancel or reschedule your appointment, please call the office within 24 hours of your appointment. Failure to do so may result in a cancellation of your appointment, and a $50 no show fee. Phone number given for call back for any questions.

## 2021-09-09 ENCOUNTER — Ambulatory Visit: Payer: Medicare Other | Admitting: Physical Therapy

## 2021-09-09 NOTE — Telephone Encounter (Signed)
Transition Care Management Unsuccessful Follow-up Telephone Call  Date of discharge and from where:  09/06/21 from [facility]  Attempts:  2nd Attempt  Reason for unsuccessful TCM follow-up call:  No answer/busy

## 2021-09-11 ENCOUNTER — Encounter: Payer: Self-pay | Admitting: Physical Therapy

## 2021-09-11 ENCOUNTER — Other Ambulatory Visit: Payer: Self-pay

## 2021-09-11 ENCOUNTER — Ambulatory Visit: Payer: Medicare Other | Admitting: Physical Therapy

## 2021-09-11 DIAGNOSIS — R201 Hypoesthesia of skin: Secondary | ICD-10-CM

## 2021-09-11 DIAGNOSIS — R293 Abnormal posture: Secondary | ICD-10-CM

## 2021-09-11 DIAGNOSIS — M6281 Muscle weakness (generalized): Secondary | ICD-10-CM

## 2021-09-11 NOTE — Telephone Encounter (Signed)
Transition Care Management Unsuccessful Follow-up Telephone Call  Date of discharge and from where:  09/06/21 from [facility]  Attempts:  3rd Attempt  Reason for unsuccessful TCM follow-up call:  No answer/busy

## 2021-09-11 NOTE — Therapy (Signed)
[place], [place], [address] Phone: [phone]   Fax:  [phone]  Physical Therapy Treatment  Patient Details  Name: Sara Gardner MRN: [id number] Date of Birth: 1950/12/26 Referring Provider (PT): Samuel Bouche   Encounter Date: 09/11/2021   PT End of Session - 09/11/21 0806     Visit Number 9    Number of Visits 12    Date for PT Re-Evaluation 09/16/21    Authorization Type UHC    PT Start Time 0803    PT Stop Time 5681    PT Time Calculation (min) 44 min             Past Medical History:  Diagnosis Date   Aortic atherosclerosis ([place]) 06/10/2017   CAD (coronary artery disease)    Cancer ([place])    Cardiomegaly 06/10/2017   Combined form of age-related cataract, both eyes 04/07/2017   Dermatochalasis of both eyelids 04/07/2017   Diabetes mellitus without complication (HCC)    Diastolic heart failure, NYHA class 1 ([place])    GERD (gastroesophageal reflux disease)    Hypertension    Lacunar infarction ([place]) 07/20/2018   Left renal mass 10/04/2018   1.7 cm hypoechoic mass 10/04/2018   Lumbar degenerative disc disease 03/09/2017   Myocardial infarction ([place]) 2005   Normocytic anemia 12/14/2018   Papillary thyroid carcinoma ([place])    Posterior vitreous detachment, right eye 04/07/2017   Thyroid disease     Past Surgical History:  Procedure Laterality Date   TOTAL THYROIDECTOMY  2016    There were no vitals filed for this visit.   Subjective Assessment - 09/11/21 0807     Subjective Pt reports she is going to have a stress test and wear a heart monitor.  She's been dealing with issues with neighbors, which tighten her neck up.    Diagnostic tests x ray of neck: cervical disc degeneration - moderate    Patient Stated Goals reduce tingling/burning    Currently in Pain? No/denies    Pain Score 0-No pain                [facility] PT Assessment - 09/11/21 0001       Assessment    Medical Diagnosis parasthesias    Referring Provider (PT) Charna Archer, [name]    Onset Date/Surgical Date 10/30/20    Hand Dominance Right    Next MD Visit PRN      AROM   Cervical - Right Rotation 50    Cervical - Left Rotation 47               [facility] Adult PT Treatment/Exercise - 09/11/21 0001       Neck Exercises: Machines for Strengthening   UBE (Upper Arm Bike) L4: 1 min each direction (standing)      Neck Exercises: Seated   Cervical Rotation Left;Right;5 reps   with head nods   Lateral Flexion Right;Left;5 reps    Other Seated Exercise row with red band x 10 x 2 sets; bilat shoulder ER with red band x 10 x 2 sets      Neck Exercises: Stretches   Upper Trapezius Stretch Right;Left;2 reps;10 seconds    Other Neck Stretches unilateral midlevel doorway stretch x 20 sec x 2      Moist Heat Therapy   Number Minutes Moist Heat 15 Minutes    Moist Heat Location Cervical      Electrical Stimulation   Electrical Stimulation Location bilat  cervical paraspinals, bilat upper trap    Electrical Stimulation Action IFC    Electrical Stimulation Parameters 15 min, intensity to tolerance    Electrical Stimulation Goals Tone;Pain               PT Long Term Goals - 09/02/21 1240       PT LONG TERM GOAL #1   Title Pt will be independent with HEP    Time 6    Period Weeks    Status On-going    Target Date 09/16/21      PT LONG TERM GOAL #2   Title Pt will improve bilat UE strength to 4+/5 to tolerate yard work with decreased pain    Time 6    Period Weeks    Status On-going    Target Date 09/16/21      PT LONG TERM GOAL #3   Title Pt will improve cervical sidebending and rotation ROM by 10 degrees to perform IADLs with decreased pain    Time 6    Period Weeks    Status Partially Met    Target Date 09/16/21                   Plan - 09/11/21 0839     Clinical Impression Statement Cervical rotation ROM improved this visit.  She reported reduction in neck  tightness with exercises and further reduction with estim/MHP at end of session.  Encouraged continued compliance with HEP to assist in reduction of pain and tightness in neck.  tingling in UE is intermittent in nature.    Rehab Potential Good    PT Frequency 2x / week    PT Duration 6 weeks    PT Treatment/Interventions Taping;Dry needling;Manual techniques;Passive range of motion;Patient/family education;Therapeutic exercise;Therapeutic activities;Neuromuscular re-education;Traction;Moist Heat;Electrical Stimulation;Cryotherapy    PT Next Visit Plan cervical mobility, manual/modalties, postural strength. (10th visit note?) - check goals.     PT Home Exercise Plan [id number]    Consulted and Agree with Plan of Care Patient             Patient will benefit from skilled therapeutic intervention in order to improve the following deficits and impairments:  Pain, Postural dysfunction, Decreased strength, Decreased activity tolerance, Decreased range of motion, Hypomobility  Visit Diagnosis: Abnormal posture  Muscle weakness (generalized)  Impaired sensation     Problem List Patient Active Problem List   Diagnosis Date Noted   Hypotension 05/26/2021   Neck pain 05/26/2021   Atherosclerosis 03/19/2020   CHF (congestive heart failure) ([place]) 11/06/2019   Other specified disorders of kidney and ureter 04/05/2019   Renal insufficiency 04/05/2019   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/05/2019   Cervicalgia 04/05/2019   Eustachian tube dysfunction, bilateral 04/05/2019   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[place]) 04/05/2019   White matter abnormality on MRI of brain 04/05/2019   Elevated creatine kinase 12/14/2018   Myalgia due to statin 12/14/2018   Normocytic anemia 12/14/2018   Acute reaction to situational stress 11/30/2018   At moderate risk for fall 10/03/2018   Bilateral renal cysts 10/03/2018   Facet arthropathy, lumbosacral 10/03/2018   Microscopic hematuria 10/03/2018   Lacunar  infarction ([place]) 07/20/2018   Craniofacial pain 07/04/2018   Proteinuria 03/21/2018   Hematuria 03/21/2018   Controlled type 2 diabetes mellitus without complication, without long-term current use of insulin ([place]) 03/08/2018   Encounter for weight management 01/06/2018   Urge incontinence 08/23/2017   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/23/2017   Urinary frequency 08/17/2017  Overactive bladder 08/17/2017   Equinus contracture of ankle 07/27/2017   Hammer toes of both feet 07/27/2017   Dermatophytosis, nail 07/27/2017   Calcaneal spur of both feet 07/27/2017   Dysuria 07/07/2017   Atypical chest pain 06/10/2017   Aortic atherosclerosis ([place]) 06/10/2017   Cardiomegaly 06/10/2017   Vitreous detachment of right eye 05/12/2017   Combined form of age-related cataract, both eyes 04/07/2017   Dermatochalasis of both eyelids 04/07/2017   Posterior vitreous detachment, right eye 04/07/2017   Acute left-sided low back pain without sciatica 03/09/2017   Postural kyphosis of cervicothoracic region 03/09/2017   Lumbar degenerative disc disease 03/09/2017   Primary osteoarthritis of left knee 03/04/2017   Chronic pain of left knee 03/04/2017   Bilateral primary osteoarthritis of knee 12/15/2016   Bilateral lower extremity edema 12/15/2016   Allergy to honey bee venom 12/15/2016   Essential hypertension 02/16/2016   DM type 2 with diabetic dyslipidemia ([place]) 02/10/2016   Gastroesophageal reflux disease without esophagitis 02/10/2016   Breast pain, left 12/30/2015   Hypothyroidism, 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   Diabetic sensorimotor polyneuropathy ([place]) 10/27/2015   Papillary thyroid carcinoma ([place]) 08/09/2014   Post-menopausal atrophic vaginitis 06/24/2014   Left thyroid nodule 01/29/2014   Nodule of left lung 01/21/2014   Class 1 obesity due to excess calories with serious comorbidity and body mass index (BMI) of 30.0 to 30.9 in adult 06/12/2012   Palpitations 11/29/2011    Abnormal mammogram of right breast 08/30/2011   Arthritis of left knee 05/24/2011   Coronary artery disease involving native coronary artery of native heart with angina pectoris ([place]) 05/24/2011   History of acute myocardial infarction of inferior wall 05/24/2011   Kerin Perna, PTA 09/11/21 4:07 PM  [place], [place], [address] Phone: [phone]   Fax:  [phone]  Name: Sara Gardner MRN: [id number] Date of Birth: Aug 26, 1950

## 2021-09-14 ENCOUNTER — Encounter (HOSPITAL_COMMUNITY): Payer: Medicare Other

## 2021-09-14 ENCOUNTER — Telehealth (HOSPITAL_COMMUNITY): Payer: Self-pay | Admitting: *Deleted

## 2021-09-14 NOTE — Telephone Encounter (Signed)
Left message on voicemail per DPR in reference to upcoming appointment scheduled on 09/21/2021 at 8:15 with detailed instructions given per Myocardial Perfusion Study Information Sheet for the test. LM to arrive 15 minutes early, and that it is imperative to arrive on time for appointment to keep from having the test rescheduled. If you need to cancel or reschedule your appointment, please call the office within 24 hours of your appointment. Failure to do so may result in a cancellation of your appointment, and a $50 no show fee. Phone number given for call back for any questions.

## 2021-09-16 ENCOUNTER — Encounter: Payer: Medicare Other | Admitting: Physical Therapy

## 2021-09-21 ENCOUNTER — Other Ambulatory Visit: Payer: Self-pay

## 2021-09-21 ENCOUNTER — Ambulatory Visit (HOSPITAL_COMMUNITY): Payer: Medicare Other | Attending: Cardiology

## 2021-09-21 DIAGNOSIS — R072 Precordial pain: Secondary | ICD-10-CM | POA: Diagnosis not present

## 2021-09-21 LAB — MYOCARDIAL PERFUSION IMAGING
Angina Index: 0
Base ST Depression (mm): 0 mm
Estimated workload: 7
Exercise duration (min): 5 min
LV dias vol: 58 mL (ref 46–106)
LV sys vol: 22 mL
MPHR: 149 {beats}/min
Nuc Stress EF: 61 %
Peak HR: 129 {beats}/min
Percent HR: 86 %
RPE: 19
Rest HR: 65 {beats}/min
Rest Nuclear Isotope Dose: 8.6 mCi
SDS: 7
SRS: 2
SSS: 9
Stress Nuclear Isotope Dose: 29.6 mCi
TID: 0.91

## 2021-09-21 MED ORDER — TECHNETIUM TC 99M PYROPHOSPHATE
8.6000 | Freq: Once | INTRAVENOUS | Status: AC
  Administered 2021-09-21: 8.6 via INTRAVENOUS

## 2021-09-21 MED ORDER — TECHNETIUM TC 99M TETROFOSMIN IV KIT
29.6000 | PACK | Freq: Once | INTRAVENOUS | Status: AC | PRN
  Administered 2021-09-21: 29.6 via INTRAVENOUS
  Filled 2021-09-21: qty 30

## 2021-09-22 DIAGNOSIS — R002 Palpitations: Secondary | ICD-10-CM | POA: Diagnosis not present

## 2021-09-23 ENCOUNTER — Ambulatory Visit: Payer: Medicare Other | Attending: Medical-Surgical | Admitting: Physical Therapy

## 2021-09-23 ENCOUNTER — Other Ambulatory Visit: Payer: Self-pay

## 2021-09-23 DIAGNOSIS — M6281 Muscle weakness (generalized): Secondary | ICD-10-CM

## 2021-09-23 DIAGNOSIS — R201 Hypoesthesia of skin: Secondary | ICD-10-CM

## 2021-09-23 DIAGNOSIS — R293 Abnormal posture: Secondary | ICD-10-CM

## 2021-09-23 NOTE — Therapy (Signed)
[place], [place], [address] Phone: [phone]   Fax:  [phone]  Physical Therapy Treatment and 10th visit note  Patient Details  Name: Sara Gardner MRN: [id number] Date of Birth: May 30, 1951 Referring Provider (PT): Samuel Bouche   Encounter Date: 09/23/2021 Dates of service 08/05/21-09/23/21   PT End of Session - 09/23/21 0839     Visit Number 10    Number of Visits 12    Date for PT Re-Evaluation 10/21/21    Authorization Type UHC    Authorization - Visit Number 10    Progress Note Due on Visit 20    PT Start Time 0803    PT Stop Time 0848    PT Time Calculation (min) 45 min    Activity Tolerance Patient tolerated treatment well    Behavior During Therapy [facility] for tasks assessed/performed             Past Medical History:  Diagnosis Date   Aortic atherosclerosis ([place]) 06/10/2017   CAD (coronary artery disease)    Cancer ([place])    Cardiomegaly 06/10/2017   Combined form of age-related cataract, both eyes 04/07/2017   Dermatochalasis of both eyelids 04/07/2017   Diabetes mellitus without complication (HCC)    Diastolic heart failure, NYHA class 1 ([place])    GERD (gastroesophageal reflux disease)    Hypertension    Lacunar infarction ([place]) 07/20/2018   Left renal mass 10/04/2018   1.7 cm hypoechoic mass 10/04/2018   Lumbar degenerative disc disease 03/09/2017   Myocardial infarction ([place]) 2005   Normocytic anemia 12/14/2018   Papillary thyroid carcinoma ([place])    Posterior vitreous detachment, right eye 04/07/2017   Thyroid disease     Past Surgical History:  Procedure Laterality Date   TOTAL THYROIDECTOMY  2016    There were no vitals filed for this visit.   Subjective Assessment - 09/23/21 0805     Subjective Pt is wearing heart monitor today. she states she is "feeling better"    Patient Stated Goals reduce tingling/burning    Currently in Pain? No/denies                 [facility] PT Assessment - 09/23/21 0001       Assessment   Medical Diagnosis parasthesias    Referring Provider (PT) Charna Archer, [name]    Onset Date/Surgical Date 10/30/20    Hand Dominance Right    Next MD Visit PRN      AROM   Cervical - Right Side Bend 35    Cervical - Left Side Bend 38    Cervical - Right Rotation 45    Cervical - Left Rotation 50      Strength   Overall Strength Comments bilat shoulder strength 4+/5                           [facility] Adult PT Treatment/Exercise - 09/23/21 0001       Neck Exercises: Machines for Strengthening   UBE (Upper Arm Bike) L4: 1 min each direction (standing)      Neck Exercises: Theraband   Shoulder Extension 20 reps;Red    Rows 15 reps;Red    Rows Limitations hold in retraction x 3 sec    Other Theraband Exercises D2 flexion red TB x 10 bilat      Neck Exercises: Seated   Cervical Rotation Left;Right;5 reps   with head nods   Other  Seated Exercise bilat ER red TB 2 x 10      Neck Exercises: Sidelying   Other Sidelying Exercise open book x 10 bilat - cues for form      Neck Exercises: Stretches   Upper Trapezius Stretch Right;Left;2 reps;20 seconds      Moist Heat Therapy   Number Minutes Moist Heat 10 Minutes    Moist Heat Location Cervical      Electrical Stimulation   Electrical Stimulation Location held due to pt wearing heart monitor      Manual Therapy   Soft tissue mobilization Lt cervical paraspinals, suboccipitals, upper trap/levator    Passive ROM cervical spine all directions                          PT Long Term Goals - 09/23/21 8546       PT LONG TERM GOAL #1   Title Pt will be independent with HEP    Status On-going    Target Date 10/21/21      PT LONG TERM GOAL #2   Title Pt will improve bilat UE strength to 4+/5 to tolerate yard work with decreased pain    Status Achieved    Target Date 10/21/21      PT LONG TERM GOAL #3   Title Pt will improve cervical  sidebending and rotation ROM by 10 degrees to perform IADLs with decreased pain    Status Achieved                   Plan - 09/23/21 0840     Clinical Impression Statement Pt has improved cervical ROM, decreased pain and improved Lt shoulder strength. She will continue to benefit form skilled PT to educate in HEP and maintain cervical mobility and postural strength    Rehab Potential Good    PT Frequency 2x / week    PT Duration 4 weeks    PT Treatment/Interventions Taping;Dry needling;Manual techniques;Passive range of motion;Patient/family education;Therapeutic exercise;Therapeutic activities;Neuromuscular re-education;Traction;Moist Heat;Electrical Stimulation;Cryotherapy    PT Next Visit Plan cervical mobility, manual/modalties, postural strength    PT Home Exercise Plan [id number]    Consulted and Agree with Plan of Care Patient             Patient will benefit from skilled therapeutic intervention in order to improve the following deficits and impairments:     Visit Diagnosis: Abnormal posture - Plan: PT plan of care cert/re-cert  Muscle weakness (generalized) - Plan: PT plan of care cert/re-cert  Impaired sensation - Plan: PT plan of care cert/re-cert     Problem List Patient Active Problem List   Diagnosis Date Noted   Hypotension 05/26/2021   Neck pain 05/26/2021   Atherosclerosis 03/19/2020   CHF (congestive heart failure) ([place]) 11/06/2019   Other specified disorders of kidney and ureter 04/05/2019   Renal insufficiency 04/05/2019   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/05/2019   Cervicalgia 04/05/2019   Eustachian tube dysfunction, bilateral 04/05/2019   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[place]) 04/05/2019   White matter abnormality on MRI of brain 04/05/2019   Elevated creatine kinase 12/14/2018   Myalgia due to statin 12/14/2018   Normocytic anemia 12/14/2018   Acute reaction to situational stress 11/30/2018   At moderate risk for fall 10/03/2018   Bilateral  renal cysts 10/03/2018   Facet arthropathy, lumbosacral 10/03/2018   Microscopic hematuria 10/03/2018   Lacunar infarction ([place]) 07/20/2018   Craniofacial pain 07/04/2018   Proteinuria 03/21/2018  Hematuria 03/21/2018   Controlled type 2 diabetes mellitus without complication, without long-term current use of insulin ([place]) 03/08/2018   Encounter for weight management 01/06/2018   Urge incontinence 08/23/2017   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/23/2017   Urinary frequency 08/17/2017   Overactive bladder 08/17/2017   Equinus contracture of ankle 07/27/2017   Hammer toes of both feet 07/27/2017   Dermatophytosis, nail 07/27/2017   Calcaneal spur of both feet 07/27/2017   Dysuria 07/07/2017   Atypical chest pain 06/10/2017   Aortic atherosclerosis ([place]) 06/10/2017   Cardiomegaly 06/10/2017   Vitreous detachment of right eye 05/12/2017   Combined form of age-related cataract, both eyes 04/07/2017   Dermatochalasis of both eyelids 04/07/2017   Posterior vitreous detachment, right eye 04/07/2017   Acute left-sided low back pain without sciatica 03/09/2017   Postural kyphosis of cervicothoracic region 03/09/2017   Lumbar degenerative disc disease 03/09/2017   Primary osteoarthritis of left knee 03/04/2017   Chronic pain of left knee 03/04/2017   Bilateral primary osteoarthritis of knee 12/15/2016   Bilateral lower extremity edema 12/15/2016   Allergy to honey bee venom 12/15/2016   Essential hypertension 02/16/2016   DM type 2 with diabetic dyslipidemia ([place]) 02/10/2016   Gastroesophageal reflux disease without esophagitis 02/10/2016   Breast pain, left 12/30/2015   Hypothyroidism, 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   Diabetic sensorimotor polyneuropathy ([place]) 10/27/2015   Papillary thyroid carcinoma ([place]) 08/09/2014   Post-menopausal atrophic vaginitis 06/24/2014   Left thyroid nodule 01/29/2014   Nodule of left lung 01/21/2014   Class 1 obesity due to excess calories with  serious comorbidity and body mass index (BMI) of 30.0 to 30.9 in adult 06/12/2012   Palpitations 11/29/2011   Abnormal mammogram of right breast 08/30/2011   Arthritis of left knee 05/24/2011   Coronary artery disease involving native coronary artery of native heart with angina pectoris ([place]) 05/24/2011   History of acute myocardial infarction of inferior wall 05/24/2011    Roberto Romanoski, PT 09/23/2021, 8:43 AM  [facility] [place], [place], [address] Phone: [phone]   Fax:  [phone]  Name: Sara Gardner MRN: [id number] Date of Birth: 1950-08-17

## 2021-09-25 ENCOUNTER — Other Ambulatory Visit: Payer: Self-pay

## 2021-09-25 ENCOUNTER — Ambulatory Visit: Payer: Medicare Other | Admitting: Physical Therapy

## 2021-09-25 DIAGNOSIS — M6281 Muscle weakness (generalized): Secondary | ICD-10-CM | POA: Diagnosis present

## 2021-09-25 DIAGNOSIS — R201 Hypoesthesia of skin: Secondary | ICD-10-CM

## 2021-09-25 DIAGNOSIS — R293 Abnormal posture: Secondary | ICD-10-CM | POA: Diagnosis not present

## 2021-09-25 NOTE — Therapy (Signed)
[place] ?[facility] ?[place] ?[place], [place], [address] ?Phone: [phone]   Fax:  [phone] ? ?Physical Therapy Treatment ? ?Patient Details  ?Name: Sara Gardner ?MRN: [id number] ?Date of Birth: 11/27/1950 ?Referring Provider (PT): Samuel Bouche ? ? ?Encounter Date: 09/25/2021 ? ? PT End of Session - 09/25/21 0836   ? ? Visit Number 11   ? Number of Visits 17   ? Date for PT Re-Evaluation 10/21/21   ? Authorization - Visit Number 11   ? Progress Note Due on Visit 20   ? PT Start Time [id number]   ? PT Stop Time 0845   ? PT Time Calculation (min) 42 min   ? Activity Tolerance Patient tolerated treatment well   ? Behavior During Therapy [facility] for tasks assessed/performed   ? ?  ?  ? ?  ? ? ?Past Medical History:  ?Diagnosis Date  ? Aortic atherosclerosis ([place]) 06/10/2017  ? CAD (coronary artery disease)   ? Cancer [facility])   ? Cardiomegaly 06/10/2017  ? Combined form of age-related cataract, both eyes 04/07/2017  ? Dermatochalasis of both eyelids 04/07/2017  ? Diabetes mellitus without complication ([place])   ? Diastolic heart failure, NYHA class 1 ([place])   ? GERD (gastroesophageal reflux disease)   ? Hypertension   ? Lacunar infarction ([place]) 07/20/2018  ? Left renal mass 10/04/2018  ? 1.7 cm hypoechoic mass 10/04/2018  ? Lumbar degenerative disc disease 03/09/2017  ? Myocardial infarction [facility]) 2005  ? Normocytic anemia 12/14/2018  ? Papillary thyroid carcinoma ([place])   ? Posterior vitreous detachment, right eye 04/07/2017  ? Thyroid disease   ? ? ?Past Surgical History:  ?Procedure Laterality Date  ? TOTAL THYROIDECTOMY  2016  ? ? ?There were no vitals filed for this visit. ? ? Subjective Assessment - 09/25/21 0806   ? ? Subjective Pt states "I feel pretty good today". Pt states she still has tingling in her hands   ? Patient Stated Goals reduce tingling/burning   ? Currently in Pain? No/denies   ? ?  ?  ? ?  ? ? ? ? ? [facility] PT Assessment - 09/25/21 0001   ? ?  ? Assessment   ? Medical Diagnosis parasthesias   ? Referring Provider (PT) Charna Archer, [name]   ? Onset Date/Surgical Date 10/30/20   ? Hand Dominance Right   ? Next MD Visit PRN   ? ?  ?  ? ?  ? ? ? ? ? ? ? ? ? ? ? ? ? ? ? ? [place] Adult PT Treatment/Exercise - 09/25/21 0001   ? ?  ? Neck Exercises: Machines for Strengthening  ? UBE (Upper Arm Bike) L4: 1 min each direction (standing)   ?  ? Neck Exercises: Theraband  ? Shoulder Extension 20 reps;Red   ? Shoulder Extension Limitations hold at end range x 3 sec   ? Rows 20 reps;Red   ? Rows Limitations hold in retraction x 3 sec   ? Other Theraband Exercises D2 flexion red TB x 10 bilat   ?  ? Neck Exercises: Seated  ? Neck Retraction 10 reps   ? Cervical Rotation Left;Right;5 reps   with head nods  ? Lateral Flexion Right;Left;5 reps   ? Other Seated Exercise bilat ER red TB 2 x 10   ?  ? Neck Exercises: Stretches  ? Upper Trapezius Stretch Right;Left;2 reps;20 seconds   ?  ? Moist Heat Therapy  ? Number Minutes  Moist Heat 10 Minutes   ? Moist Heat Location Cervical   ?  ? Electrical Stimulation  ? Electrical Stimulation Location held due to pt wearing heart monitor   ?  ? Manual Therapy  ? Soft tissue mobilization Lt cervical paraspinals, suboccipitals, upper trap/levator   ? Passive ROM cervical spine all directions   ? ?  ?  ? ?  ? ? ? ? ? ? ? ? ? ? ? ? ? ? ? PT Long Term Goals - 09/23/21 0821   ? ?  ? PT LONG TERM GOAL #1  ? Title Pt will be independent with HEP   ? Status On-going   ? Target Date 10/21/21   ?  ? PT LONG TERM GOAL #2  ? Title Pt will improve bilat UE strength to 4+/5 to tolerate yard work with decreased pain   ? Status Achieved   ? Target Date 10/21/21   ?  ? PT LONG TERM GOAL #3  ? Title Pt will improve cervical sidebending and rotation ROM by 10 degrees to perform IADLs with decreased pain   ? Status Achieved   ? ?  ?  ? ?  ? ? ? ? ? ? ? ? Plan - 09/25/21 0837   ? ? Clinical Impression Statement Pt continues to respond well to manual therapy and stretching. Added  isometric holds to improve postural endurance   ? PT Next Visit Plan cervical mobility, manual/modalties, postural strength   ? PT Home Exercise Plan [email or web address]   ? Consulted and Agree with Plan of Care Patient   ? ?  ?  ? ?  ? ? ?Patient will benefit from skilled therapeutic intervention in order to improve the following deficits and impairments:    ? ?Visit Diagnosis: ?Abnormal posture ? ?Muscle weakness (generalized) ? ?Impaired sensation ? ? ? ? ?Problem List ?Patient Active Problem List  ? Diagnosis Date Noted  ? Hypotension 05/26/2021  ? Neck pain 05/26/2021  ? Atherosclerosis 03/19/2020  ? CHF (congestive heart failure) ([place]) 11/06/2019  ? Other specified disorders of kidney and ureter 04/05/2019  ? Renal insufficiency 04/05/2019  ? 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/05/2019  ? Cervicalgia 04/05/2019  ? Eustachian tube dysfunction, bilateral 04/05/2019  ? 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 White matter abnormality on MRI of brain 04/05/2019  ? Elevated creatine kinase 12/14/2018  ? Myalgia due to statin 12/14/2018  ? Normocytic anemia 12/14/2018  ? Acute reaction to situational stress 11/30/2018  ? At moderate risk for fall 10/03/2018  ? Bilateral renal cysts 10/03/2018  ? Facet arthropathy, lumbosacral 10/03/2018  ? Microscopic hematuria 10/03/2018  ? Lacunar infarction ([place]) 07/20/2018  ? Craniofacial pain 07/04/2018  ? Proteinuria 03/21/2018  ? Hematuria 03/21/2018  ? Controlled type 2 diabetes mellitus without complication, without long-term current use of insulin ([place]) 03/08/2018  ? Encounter for weight management 01/06/2018  ? Urge incontinence 08/23/2017  ? 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/23/2017  ? Urinary frequency 08/17/2017  ? Overactive bladder 08/17/2017  ? Equinus contracture of ankle 07/27/2017  ? Hammer toes of both feet 07/27/2017  ? Dermatophytosis, nail 07/27/2017  ? Calcaneal spur of both feet 07/27/2017  ? Dysuria 07/07/2017  ? Atypical chest pain 06/10/2017  ?  Aortic atherosclerosis ([place]) 06/10/2017  ? Cardiomegaly 06/10/2017  ? Vitreous detachment of right eye 05/12/2017  ? Combined form of age-related cataract, both eyes 04/07/2017  ? Dermatochalasis of both eyelids 04/07/2017  ? Posterior vitreous detachment, right eye 04/07/2017  ?  Acute left-sided low back pain without sciatica 03/09/2017  ? Postural kyphosis of cervicothoracic region 03/09/2017  ? Lumbar degenerative disc disease 03/09/2017  ? Primary osteoarthritis of left knee 03/04/2017  ? Chronic pain of left knee 03/04/2017  ? Bilateral primary osteoarthritis of knee 12/15/2016  ? Bilateral lower extremity edema 12/15/2016  ? Allergy to honey bee venom 12/15/2016  ? Essential hypertension 02/16/2016  ? DM type 2 with diabetic dyslipidemia ([place]) 02/10/2016  ? Gastroesophageal reflux disease without esophagitis 02/10/2016  ? Breast pain, left 12/30/2015  ? 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 Diabetic sensorimotor polyneuropathy ([place]) 10/27/2015  ? Papillary thyroid carcinoma ([place]) 08/09/2014  ? Post-menopausal atrophic vaginitis 06/24/2014  ? Left thyroid nodule 01/29/2014  ? Nodule of left lung 01/21/2014  ? Class 1 obesity due to excess calories with serious comorbidity and body mass index (BMI) of 30.0 to 30.9 in adult 06/12/2012  ? Palpitations 11/29/2011  ? Abnormal mammogram of right breast 08/30/2011  ? Arthritis of left knee 05/24/2011  ? Coronary artery disease involving native coronary artery of native heart with angina pectoris ([place]) 05/24/2011  ? History of acute myocardial infarction of inferior wall 05/24/2011  ? ? ?Hue Steveson, PT ?09/25/2021, 8:39 AM ? ?[place] ?[facility] ?[place] ?[place], [place], [address] ?Phone: [phone]   Fax:  [phone] ? ?Name: Sara Gardner ?MRN: [id number] ?Date of Birth: [birth year] ? ? ? ?

## 2021-09-30 ENCOUNTER — Other Ambulatory Visit: Payer: Self-pay

## 2021-09-30 ENCOUNTER — Ambulatory Visit: Payer: Medicare Other | Admitting: Physical Therapy

## 2021-09-30 DIAGNOSIS — R293 Abnormal posture: Secondary | ICD-10-CM

## 2021-09-30 DIAGNOSIS — M6281 Muscle weakness (generalized): Secondary | ICD-10-CM

## 2021-09-30 DIAGNOSIS — R201 Hypoesthesia of skin: Secondary | ICD-10-CM

## 2021-09-30 NOTE — Therapy (Signed)
[place] ?[facility] ?[place] ?[place], [place], [address] ?Phone: [phone]   Fax:  [phone] ? ?Physical Therapy Treatment ? ?Patient Details  ?Name: Sara Gardner ?MRN: [id number] ?Date of Birth: Feb 01, 1951 ?Referring Provider (PT): Samuel Bouche ? ? ?Encounter Date: 09/30/2021 ? ? PT End of Session - 09/30/21 0839   ? ? Visit Number 12   ? Number of Visits 17   ? Date for PT Re-Evaluation 10/21/21   ? Authorization - Visit Number 12   ? Progress Note Due on Visit 20   ? PT Start Time 0805   ? PT Stop Time 0848   ? PT Time Calculation (min) 43 min   ? Activity Tolerance Patient tolerated treatment well   ? Behavior During Therapy [facility] for tasks assessed/performed   ? ?  ?  ? ?  ? ? ?Past Medical History:  ?Diagnosis Date  ? Aortic atherosclerosis ([place]) 06/10/2017  ? CAD (coronary artery disease)   ? Cancer [facility])   ? Cardiomegaly 06/10/2017  ? Combined form of age-related cataract, both eyes 04/07/2017  ? Dermatochalasis of both eyelids 04/07/2017  ? Diabetes mellitus without complication ([place])   ? Diastolic heart failure, NYHA class 1 ([place])   ? GERD (gastroesophageal reflux disease)   ? Hypertension   ? Lacunar infarction ([place]) 07/20/2018  ? Left renal mass 10/04/2018  ? 1.7 cm hypoechoic mass 10/04/2018  ? Lumbar degenerative disc disease 03/09/2017  ? Myocardial infarction [facility]) 2005  ? Normocytic anemia 12/14/2018  ? Papillary thyroid carcinoma ([place])   ? Posterior vitreous detachment, right eye 04/07/2017  ? Thyroid disease   ? ? ?Past Surgical History:  ?Procedure Laterality Date  ? TOTAL THYROIDECTOMY  2016  ? ? ?There were no vitals filed for this visit. ? ? Subjective Assessment - 09/30/21 0807   ? ? Subjective Pt states "nothing new. same old same old". states she feels "a little stiff" this morning   ? Patient Stated Goals reduce tingling/burning   ? Currently in Pain? Yes   ? Pain Score 2    ? Pain Location Neck   ? Pain Orientation Posterior   ? Pain  Descriptors / Indicators --   stiff  ? ?  ?  ? ?  ? ? ? ? ? [facility] PT Assessment - 09/30/21 0001   ? ?  ? Assessment  ? Medical Diagnosis parasthesias   ? Referring Provider (PT) Charna Archer, [name]   ? Onset Date/Surgical Date 10/30/20   ? Hand Dominance Right   ? Next MD Visit PRN   ?  ? AROM  ? Cervical - Right Side Bend 33   ? Cervical - Left Side Bend 33   ? ?  ?  ? ?  ? ? ? ? ? ? ? ? ? ? ? ? ? ? ? ? [place] Adult PT Treatment/Exercise - 09/30/21 0001   ? ?  ? Neck Exercises: Machines for Strengthening  ? UBE (Upper Arm Bike) L4: 1 min each direction (standing)   ?  ? Neck Exercises: Theraband  ? Shoulder Extension 20 reps;Green   ? Shoulder Extension Limitations hold at end range x 3 sec   ? Rows 20 reps;Green   ? Rows Limitations hold in retraction x 3 sec   ? Other Theraband Exercises D2 flexion red TB x 10 bilat   ? Other Theraband Exercises bow and arrow red TB x 10 bilat   ?  ? Neck Exercises:  Seated  ? Other Seated Exercise bilat ER red TB 2 x 10   ?  ? Neck Exercises: Sidelying  ? Other Sidelying Exercise open book x 10 bilat   ?  ? Neck Exercises: Stretches  ? Upper Trapezius Stretch Right;Left;2 reps;20 seconds   ? Other Neck Stretches doorway stretch 3 x 20sec   ?  ? Moist Heat Therapy  ? Number Minutes Moist Heat 10 Minutes   ? Moist Heat Location Cervical   ?  ? Electrical Stimulation  ? Electrical Stimulation Location held due to pt wearing heart monitor   ?  ? Manual Therapy  ? Soft tissue mobilization Lt cervical paraspinals, suboccipitals, upper trap/levator   ? Passive ROM cervical spine all directions   ? ?  ?  ? ?  ? ? ? ? ? ? ? ? ? ? ? ? ? ? ? PT Long Term Goals - 09/23/21 0821   ? ?  ? PT LONG TERM GOAL #1  ? Title Pt will be independent with HEP   ? Status On-going   ? Target Date 10/21/21   ?  ? PT LONG TERM GOAL #2  ? Title Pt will improve bilat UE strength to 4+/5 to tolerate yard work with decreased pain   ? Status Achieved   ? Target Date 10/21/21   ?  ? PT LONG TERM GOAL #3  ? Title Pt will  improve cervical sidebending and rotation ROM by 10 degrees to perform IADLs with decreased pain   ? Status Achieved   ? ?  ?  ? ?  ? ? ? ? ? ? ? ? Plan - 09/30/21 0840   ? ? Clinical Impression Statement Added bow and arrow and doorway stretch for thoracic mobility, good response. ROM unchanged since last week   ? PT Next Visit Plan cervical mobility, manual/modalties, postural strength   ? PT Home Exercise Plan [email or web address]   ? Consulted and Agree with Plan of Care Patient   ? ?  ?  ? ?  ? ? ?Patient will benefit from skilled therapeutic intervention in order to improve the following deficits and impairments:    ? ?Visit Diagnosis: ?Abnormal posture ? ?Muscle weakness (generalized) ? ?Impaired sensation ? ? ? ? ?Problem List ?Patient Active Problem List  ? Diagnosis Date Noted  ? Hypotension 05/26/2021  ? Neck pain 05/26/2021  ? Atherosclerosis 03/19/2020  ? CHF (congestive heart failure) ([place]) 11/06/2019  ? Other specified disorders of kidney and ureter 04/05/2019  ? Renal insufficiency 04/05/2019  ? 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/05/2019  ? Cervicalgia 04/05/2019  ? Eustachian tube dysfunction, bilateral 04/05/2019  ? 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 White matter abnormality on MRI of brain 04/05/2019  ? Elevated creatine kinase 12/14/2018  ? Myalgia due to statin 12/14/2018  ? Normocytic anemia 12/14/2018  ? Acute reaction to situational stress 11/30/2018  ? At moderate risk for fall 10/03/2018  ? Bilateral renal cysts 10/03/2018  ? Facet arthropathy, lumbosacral 10/03/2018  ? Microscopic hematuria 10/03/2018  ? Lacunar infarction ([place]) 07/20/2018  ? Craniofacial pain 07/04/2018  ? Proteinuria 03/21/2018  ? Hematuria 03/21/2018  ? Controlled type 2 diabetes mellitus without complication, without long-term current use of insulin ([place]) 03/08/2018  ? Encounter for weight management 01/06/2018  ? Urge incontinence 08/23/2017  ? 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/23/2017  ? Urinary  frequency 08/17/2017  ? Overactive bladder 08/17/2017  ? Equinus contracture of ankle 07/27/2017  ? Hammer toes of  both feet 07/27/2017  ? Dermatophytosis, nail 07/27/2017  ? Calcaneal spur of both feet 07/27/2017  ? Dysuria 07/07/2017  ? Atypical chest pain 06/10/2017  ? Aortic atherosclerosis ([place]) 06/10/2017  ? Cardiomegaly 06/10/2017  ? Vitreous detachment of right eye 05/12/2017  ? Combined form of age-related cataract, both eyes 04/07/2017  ? Dermatochalasis of both eyelids 04/07/2017  ? Posterior vitreous detachment, right eye 04/07/2017  ? Acute left-sided low back pain without sciatica 03/09/2017  ? Postural kyphosis of cervicothoracic region 03/09/2017  ? Lumbar degenerative disc disease 03/09/2017  ? Primary osteoarthritis of left knee 03/04/2017  ? Chronic pain of left knee 03/04/2017  ? Bilateral primary osteoarthritis of knee 12/15/2016  ? Bilateral lower extremity edema 12/15/2016  ? Allergy to honey bee venom 12/15/2016  ? Essential hypertension 02/16/2016  ? DM type 2 with diabetic dyslipidemia ([place]) 02/10/2016  ? Gastroesophageal reflux disease without esophagitis 02/10/2016  ? Breast pain, left 12/30/2015  ? 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 Diabetic sensorimotor polyneuropathy ([place]) 10/27/2015  ? Papillary thyroid carcinoma ([place]) 08/09/2014  ? Post-menopausal atrophic vaginitis 06/24/2014  ? Left thyroid nodule 01/29/2014  ? Nodule of left lung 01/21/2014  ? Class 1 obesity due to excess calories with serious comorbidity and body mass index (BMI) of 30.0 to 30.9 in adult 06/12/2012  ? Palpitations 11/29/2011  ? Abnormal mammogram of right breast 08/30/2011  ? Arthritis of left knee 05/24/2011  ? Coronary artery disease involving native coronary artery of native heart with angina pectoris ([place]) 05/24/2011  ? History of acute myocardial infarction of inferior wall 05/24/2011  ? ? ?Ansley Mangiapane, PT ?09/30/2021, 8:41 AM ? ?[place] ?[facility] ?[address] ?[place], [place], [address] ?Phone: [phone]   Fax:  [phone] ? ?Name: Dondi Burandt ?MRN: [id number] ?Date of Birth: October 25, 1950 ? ? ? ?

## 2021-10-02 ENCOUNTER — Ambulatory Visit: Payer: Medicare Other | Admitting: Physical Therapy

## 2021-10-07 ENCOUNTER — Ambulatory Visit: Payer: Medicare Other | Admitting: Physical Therapy

## 2021-10-07 ENCOUNTER — Other Ambulatory Visit: Payer: Self-pay

## 2021-10-07 DIAGNOSIS — R293 Abnormal posture: Secondary | ICD-10-CM

## 2021-10-07 DIAGNOSIS — M6281 Muscle weakness (generalized): Secondary | ICD-10-CM

## 2021-10-07 DIAGNOSIS — R201 Hypoesthesia of skin: Secondary | ICD-10-CM

## 2021-10-07 NOTE — Therapy (Signed)
[place] ?[facility] ?[address] ?[place], [place], [address] ?Phone: [phone]   Fax:  [phone] ? ?Physical Therapy Treatment ? ?Patient Details  ?Name: Sara Gardner ?MRN: [id number] ?Date of Birth: 1951-04-02 ?Referring Provider (PT): Samuel Bouche ? ? ?Encounter Date: 10/07/2021 ? ? PT End of Session - 10/07/21 1500   ? ? Visit Number 13   ? Number of Visits 17   ? Date for PT Re-Evaluation 10/21/21   ? Authorization Type UHC   ? Authorization - Visit Number 13   ? Progress Note Due on Visit 20   ? PT Start Time 1430   ? PT Stop Time 1510   ? PT Time Calculation (min) 40 min   ? Activity Tolerance Patient tolerated treatment well   ? Behavior During Therapy [facility] for tasks assessed/performed   ? ?  ?  ? ?  ? ? ?Past Medical History:  ?Diagnosis Date  ? Aortic atherosclerosis ([place]) 06/10/2017  ? CAD (coronary artery disease)   ? Cancer [facility])   ? Cardiomegaly 06/10/2017  ? Combined form of age-related cataract, both eyes 04/07/2017  ? Dermatochalasis of both eyelids 04/07/2017  ? Diabetes mellitus without complication ([place])   ? Diastolic heart failure, NYHA class 1 ([place])   ? GERD (gastroesophageal reflux disease)   ? Hypertension   ? Lacunar infarction ([place]) 07/20/2018  ? Left renal mass 10/04/2018  ? 1.7 cm hypoechoic mass 10/04/2018  ? Lumbar degenerative disc disease 03/09/2017  ? Myocardial infarction [facility]) 2005  ? Normocytic anemia 12/14/2018  ? Papillary thyroid carcinoma ([place])   ? Posterior vitreous detachment, right eye 04/07/2017  ? Thyroid disease   ? ? ?Past Surgical History:  ?Procedure Laterality Date  ? TOTAL THYROIDECTOMY  2016  ? ? ?There were no vitals filed for this visit. ? ? Subjective Assessment - 10/07/21 1435   ? ? Subjective Pt states "I might be a little more loose"   ? Patient Stated Goals reduce tingling/burning   ? Currently in Pain? Yes   ? Pain Score 2    ? Pain Location Neck   ? Pain Orientation Posterior   ? ?  ?  ? ?  ? ? ? ? ?  [facility] PT Assessment - 10/07/21 0001   ? ?  ? Assessment  ? Medical Diagnosis parasthesias   ? Referring Provider (PT) Charna Archer, [name]   ? Onset Date/Surgical Date 10/30/20   ? Hand Dominance Right   ? Next MD Visit PRN   ?  ? Palpation  ? Palpation comment increased mm spasticity upper traps Lt > Rt   ? ?  ?  ? ?  ? ? ? ? ? ? ? ? ? ? ? ? ? ? ? ? [facility] Adult PT Treatment/Exercise - 10/07/21 0001   ? ?  ? Neck Exercises: Machines for Strengthening  ? UBE (Upper Arm Bike) L4: 1 min each direction (standing)   ?  ? Neck Exercises: Theraband  ? Shoulder Extension 20 reps;Green   ? Shoulder Extension Limitations hold at end range x 3 sec   ? Rows 20 reps;Green   ? Rows Limitations hold in retraction x 3 sec   ? Other Theraband Exercises D1 and D2 flexion red TB x 10   ? Other Theraband Exercises bow and arrow green TB x 10   ?  ? Neck Exercises: Seated  ? Other Seated Exercise bilat ER red TB 2 x 10   ?  ?  Neck Exercises: Sidelying  ? Other Sidelying Exercise open book x 10 bilat   ?  ? Neck Exercises: Stretches  ? Upper Trapezius Stretch Right;Left;2 reps;20 seconds   ? Other Neck Stretches doorway stretch 3 x 20sec   ?  ? Moist Heat Therapy  ? Number Minutes Moist Heat 10 Minutes   ? Moist Heat Location Cervical   ?  ? Electrical Stimulation  ? Electrical Stimulation Location cervical   ? Electrical Stimulation Action TENS   ? Electrical Stimulation Parameters to tolerance   ? Electrical Stimulation Goals Pain;Tone   ? ?  ?  ? ?  ? ? ? ? ? ? ? ? ? ? ? ? ? ? ? PT Long Term Goals - 09/23/21 0821   ? ?  ? PT LONG TERM GOAL #1  ? Title Pt will be independent with HEP   ? Status On-going   ? Target Date 10/21/21   ?  ? PT LONG TERM GOAL #2  ? Title Pt will improve bilat UE strength to 4+/5 to tolerate yard work with decreased pain   ? Status Achieved   ? Target Date 10/21/21   ?  ? PT LONG TERM GOAL #3  ? Title Pt will improve cervical sidebending and rotation ROM by 10 degrees to perform IADLs with decreased pain   ? Status  Achieved   ? ?  ?  ? ?  ? ? ? ? ? ? ? ? Plan - 10/07/21 1501   ? ? Clinical Impression Statement Pt with decreased mm spasticity but still tight in upper traps. Able to return to TENS for pain and tone since heart monitor is removed.   ? PT Next Visit Plan cervical mobility, manual/modalties, postural strength   ? PT Home Exercise Plan [id number]   ? Consulted and Agree with Plan of Care Patient   ? ?  ?  ? ?  ? ? ?Patient will benefit from skilled therapeutic intervention in order to improve the following deficits and impairments:    ? ?Visit Diagnosis: ?Abnormal posture ? ?Muscle weakness (generalized) ? ?Impaired sensation ? ? ? ? ?Problem List ?Patient Active Problem List  ? Diagnosis Date Noted  ? Hypotension 05/26/2021  ? Neck pain 05/26/2021  ? Atherosclerosis 03/19/2020  ? CHF (congestive heart failure) ([place]) 11/06/2019  ? Other specified disorders of kidney and ureter 04/05/2019  ? Renal insufficiency 04/05/2019  ? 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/05/2019  ? Cervicalgia 04/05/2019  ? Eustachian tube dysfunction, bilateral 04/05/2019  ? 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 White matter abnormality on MRI of brain 04/05/2019  ? Elevated creatine kinase 12/14/2018  ? Myalgia due to statin 12/14/2018  ? Normocytic anemia 12/14/2018  ? Acute reaction to situational stress 11/30/2018  ? At moderate risk for fall 10/03/2018  ? Bilateral renal cysts 10/03/2018  ? Facet arthropathy, lumbosacral 10/03/2018  ? Microscopic hematuria 10/03/2018  ? Lacunar infarction ([place]) 07/20/2018  ? Craniofacial pain 07/04/2018  ? Proteinuria 03/21/2018  ? Hematuria 03/21/2018  ? Controlled type 2 diabetes mellitus without complication, without long-term current use of insulin ([place]) 03/08/2018  ? Encounter for weight management 01/06/2018  ? Urge incontinence 08/23/2017  ? 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/23/2017  ? Urinary frequency 08/17/2017  ? Overactive bladder 08/17/2017  ? Equinus contracture of ankle  07/27/2017  ? Hammer toes of both feet 07/27/2017  ? Dermatophytosis, nail 07/27/2017  ? Calcaneal spur of both feet 07/27/2017  ? Dysuria 07/07/2017  ?  Atypical chest pain 06/10/2017  ? Aortic atherosclerosis ([place]) 06/10/2017  ? Cardiomegaly 06/10/2017  ? Vitreous detachment of right eye 05/12/2017  ? Combined form of age-related cataract, both eyes 04/07/2017  ? Dermatochalasis of both eyelids 04/07/2017  ? Posterior vitreous detachment, right eye 04/07/2017  ? Acute left-sided low back pain without sciatica 03/09/2017  ? Postural kyphosis of cervicothoracic region 03/09/2017  ? Lumbar degenerative disc disease 03/09/2017  ? Primary osteoarthritis of left knee 03/04/2017  ? Chronic pain of left knee 03/04/2017  ? Bilateral primary osteoarthritis of knee 12/15/2016  ? Bilateral lower extremity edema 12/15/2016  ? Allergy to honey bee venom 12/15/2016  ? Essential hypertension 02/16/2016  ? DM type 2 with diabetic dyslipidemia ([place]) 02/10/2016  ? Gastroesophageal reflux disease without esophagitis 02/10/2016  ? Breast pain, left 12/30/2015  ? 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 Diabetic sensorimotor polyneuropathy ([place]) 10/27/2015  ? Papillary thyroid carcinoma ([place]) 08/09/2014  ? Post-menopausal atrophic vaginitis 06/24/2014  ? Left thyroid nodule 01/29/2014  ? Nodule of left lung 01/21/2014  ? Class 1 obesity due to excess calories with serious comorbidity and body mass index (BMI) of 30.0 to 30.9 in adult 06/12/2012  ? Palpitations 11/29/2011  ? Abnormal mammogram of right breast 08/30/2011  ? Arthritis of left knee 05/24/2011  ? Coronary artery disease involving native coronary artery of native heart with angina pectoris ([place]) 05/24/2011  ? History of acute myocardial infarction of inferior wall 05/24/2011  ? ? ?Pam Vanalstine, PT ?10/07/2021, 3:03 PM ? ?[place] ?[facility] ?[address] ?[place], [place], [address] ?Phone: [phone]   Fax:   [phone] ? ?Name: Teshia Mahone ?MRN: [id number] ?Date of Birth: 1951-05-31 ? ? ? ?

## 2021-10-09 ENCOUNTER — Ambulatory Visit: Payer: Medicare Other | Admitting: Physical Therapy

## 2021-10-09 ENCOUNTER — Other Ambulatory Visit: Payer: Self-pay

## 2021-10-09 DIAGNOSIS — R293 Abnormal posture: Secondary | ICD-10-CM

## 2021-10-09 DIAGNOSIS — R201 Hypoesthesia of skin: Secondary | ICD-10-CM

## 2021-10-09 DIAGNOSIS — M6281 Muscle weakness (generalized): Secondary | ICD-10-CM

## 2021-10-09 NOTE — Therapy (Signed)
[place] ?[facility] ?[address] ?[place], [place], [address] ?Phone: [phone]   Fax:  [phone] ? ?Physical Therapy Treatment ? ?Patient Details  ?Name: Sara Gardner ?MRN: [id number] ?Date of Birth: 02/23/1951 ?Referring Provider (PT): Samuel Bouche ? ? ?Encounter Date: 10/09/2021 ? ? PT End of Session - 10/09/21 0803   ? ? Visit Number 14   ? Number of Visits 17   ? Date for PT Re-Evaluation 10/21/21   ? Authorization Type UHC   ? Authorization - Visit Number 14   ? Progress Note Due on Visit 20   ? PT Start Time [phone]   ? PT Stop Time 0850   ? PT Time Calculation (min) 46 min   ? Activity Tolerance Patient tolerated treatment well   ? Behavior During Therapy [facility] for tasks assessed/performed   ? ?  ?  ? ?  ? ? ?Past Medical History:  ?Diagnosis Date  ? Aortic atherosclerosis ([place]) 06/10/2017  ? CAD (coronary artery disease)   ? Cancer [facility])   ? Cardiomegaly 06/10/2017  ? Combined form of age-related cataract, both eyes 04/07/2017  ? Dermatochalasis of both eyelids 04/07/2017  ? Diabetes mellitus without complication ([place])   ? Diastolic heart failure, NYHA class 1 ([place])   ? GERD (gastroesophageal reflux disease)   ? Hypertension   ? Lacunar infarction ([place]) 07/20/2018  ? Left renal mass 10/04/2018  ? 1.7 cm hypoechoic mass 10/04/2018  ? Lumbar degenerative disc disease 03/09/2017  ? Myocardial infarction [facility]) 2005  ? Normocytic anemia 12/14/2018  ? Papillary thyroid carcinoma ([place])   ? Posterior vitreous detachment, right eye 04/07/2017  ? Thyroid disease   ? ? ?Past Surgical History:  ?Procedure Laterality Date  ? TOTAL THYROIDECTOMY  2016  ? ? ?There were no vitals filed for this visit. ? ? Subjective Assessment - 10/09/21 0806   ? ? Subjective Pt states she's feeling stiff. Reports no issues with her exercises.   ? Diagnostic tests x ray of neck: cervical disc degeneration - moderate   ? Patient Stated Goals reduce tingling/burning   ? Currently in Pain? Yes    ? Pain Score 5    ? Pain Location Neck   ? Pain Orientation Posterior;Left   ? ?  ?  ? ?  ? ? ? ? ? [facility] PT Assessment - 10/09/21 0001   ? ?  ? Assessment  ? Medical Diagnosis parasthesias   ? Referring Provider (PT) Charna Archer, [name]   ? Onset Date/Surgical Date 10/30/20   ? Hand Dominance Right   ? Next MD Visit PRN   ? ?  ?  ? ?  ? ? ? ? ? ? ? ? ? ? ? ? ? ? ? ? [facility] Adult PT Treatment/Exercise - 10/09/21 0001   ? ?  ? Neck Exercises: Machines for Strengthening  ? UBE (Upper Arm Bike) L4: 1 min each direction (standing)   ?  ? Neck Exercises: Theraband  ? Shoulder Extension 20 reps;Green   ? Shoulder Extension Limitations hold at end range x 3 sec   ? Rows 20 reps;Green   ? Rows Limitations hold in retraction x 3 sec   ? Shoulder External Rotation Green;20 reps   ? Other Theraband Exercises "W" with green tband x20   ?  ? Neck Exercises: Seated  ? Cervical Rotation Right;Left;10 reps   ?  ? Neck Exercises: Stretches  ? Upper Trapezius Stretch Right;Left;2 reps;20 seconds   ?  ?  Moist Heat Therapy  ? Number Minutes Moist Heat 15 Minutes   ? Moist Heat Location Cervical   ?  ? Electrical Stimulation  ? Electrical Stimulation Location cervical   ? Electrical Stimulation Action premod   ? Electrical Stimulation Parameters to pt tolerance   ? Electrical Stimulation Goals Pain;Tone   ?  ? Manual Therapy  ? Joint Mobilization cervical distraction, PAs and lateral glides to improve ROM; thoracic mobs into extension   ? Soft tissue mobilization Lt cervical paraspinals, suboccipitals, upper trap/levator   ? Passive ROM cervical spine all directions   ? ?  ?  ? ?  ? ? ? ? ? ? ? ? ? ? ? ? ? ? ? PT Long Term Goals - 09/23/21 0821   ? ?  ? PT LONG TERM GOAL #1  ? Title Pt will be independent with HEP   ? Status On-going   ? Target Date 10/21/21   ?  ? PT LONG TERM GOAL #2  ? Title Pt will improve bilat UE strength to 4+/5 to tolerate yard work with decreased pain   ? Status Achieved   ? Target Date 10/21/21   ?  ? PT LONG TERM GOAL  #3  ? Title Pt will improve cervical sidebending and rotation ROM by 10 degrees to perform IADLs with decreased pain   ? Status Achieved   ? ?  ?  ? ?  ? ? ? ? ? ? ? ? Plan - 10/09/21 0840   ? ? Clinical Impression Statement Increased L UT spasticity noted this AM. Continued to provide manual work to address her continued cervical stiffness. Progressed pt to green tband with shoulder ER and "W". Does well with rows. Pt request for increased time on e-stim today due to her increased tension.   ? Personal Factors and Comorbidities Past/Current Experience;Time since onset of injury/illness/exacerbation   ? Examination-Activity Limitations Reach Overhead   ? Examination-Participation Restrictions Cleaning;Yard Work;Community Activity   ? Rehab Potential Good   ? PT Frequency 2x / week   ? PT Duration 4 weeks   ? PT Treatment/Interventions Taping;Dry needling;Manual techniques;Passive range of motion;Patient/family education;Therapeutic exercise;Therapeutic activities;Neuromuscular re-education;Traction;Moist Heat;Electrical Stimulation;Cryotherapy   ? PT Next Visit Plan cervical mobility, manual/modalties, postural strength   ? PT Home Exercise Plan [id number]   ? Consulted and Agree with Plan of Care Patient   ? ?  ?  ? ?  ? ? ?Patient will benefit from skilled therapeutic intervention in order to improve the following deficits and impairments:  Pain, Postural dysfunction, Decreased strength, Decreased activity tolerance, Decreased range of motion, Hypomobility ? ?Visit Diagnosis: ?Abnormal posture ? ?Muscle weakness (generalized) ? ?Impaired sensation ? ? ? ? ?Problem List ?Patient Active Problem List  ? Diagnosis Date Noted  ? Hypotension 05/26/2021  ? Neck pain 05/26/2021  ? Atherosclerosis 03/19/2020  ? CHF (congestive heart failure) ([place]) 11/06/2019  ? Other specified disorders of kidney and ureter 04/05/2019  ? Renal insufficiency 04/05/2019  ? 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/05/2019  ? Cervicalgia 04/05/2019   ? Eustachian tube dysfunction, bilateral 04/05/2019  ? 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 White matter abnormality on MRI of brain 04/05/2019  ? Elevated creatine kinase 12/14/2018  ? Myalgia due to statin 12/14/2018  ? Normocytic anemia 12/14/2018  ? Acute reaction to situational stress 11/30/2018  ? At moderate risk for fall 10/03/2018  ? Bilateral renal cysts 10/03/2018  ? Facet arthropathy, lumbosacral 10/03/2018  ? Microscopic hematuria 10/03/2018  ?  Lacunar infarction ([place]) 07/20/2018  ? Craniofacial pain 07/04/2018  ? Proteinuria 03/21/2018  ? Hematuria 03/21/2018  ? Controlled type 2 diabetes mellitus without complication, without long-term current use of insulin ([place]) 03/08/2018  ? Encounter for weight management 01/06/2018  ? Urge incontinence 08/23/2017  ? 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/23/2017  ? Urinary frequency 08/17/2017  ? Overactive bladder 08/17/2017  ? Equinus contracture of ankle 07/27/2017  ? Hammer toes of both feet 07/27/2017  ? Dermatophytosis, nail 07/27/2017  ? Calcaneal spur of both feet 07/27/2017  ? Dysuria 07/07/2017  ? Atypical chest pain 06/10/2017  ? Aortic atherosclerosis ([place]) 06/10/2017  ? Cardiomegaly 06/10/2017  ? Vitreous detachment of right eye 05/12/2017  ? Combined form of age-related cataract, both eyes 04/07/2017  ? Dermatochalasis of both eyelids 04/07/2017  ? Posterior vitreous detachment, right eye 04/07/2017  ? Acute left-sided low back pain without sciatica 03/09/2017  ? Postural kyphosis of cervicothoracic region 03/09/2017  ? Lumbar degenerative disc disease 03/09/2017  ? Primary osteoarthritis of left knee 03/04/2017  ? Chronic pain of left knee 03/04/2017  ? Bilateral primary osteoarthritis of knee 12/15/2016  ? Bilateral lower extremity edema 12/15/2016  ? Allergy to honey bee venom 12/15/2016  ? Essential hypertension 02/16/2016  ? DM type 2 with diabetic dyslipidemia ([place]) 02/10/2016  ? Gastroesophageal reflux disease without  esophagitis 02/10/2016  ? Breast pain, left 12/30/2015  ? 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 Diabetic sensorimotor polyneuropathy ([place]) 10/27/2015  ? Papillary thyroid carcinoma ([place]) 08/09/2014

## 2021-10-14 ENCOUNTER — Other Ambulatory Visit: Payer: Self-pay

## 2021-10-14 ENCOUNTER — Ambulatory Visit: Payer: Medicare Other | Admitting: Physical Therapy

## 2021-10-14 DIAGNOSIS — R293 Abnormal posture: Secondary | ICD-10-CM | POA: Diagnosis not present

## 2021-10-14 DIAGNOSIS — M6281 Muscle weakness (generalized): Secondary | ICD-10-CM

## 2021-10-14 DIAGNOSIS — R201 Hypoesthesia of skin: Secondary | ICD-10-CM

## 2021-10-14 NOTE — Therapy (Signed)
[place] ?[facility] ?[place] ?[place], [place], [address] ?Phone: [phone]   Fax:  [phone] ? ?Physical Therapy Treatment ? ?Patient Details  ?Name: Sara Gardner ?MRN: [id number] ?Date of Birth: 08/08/1950 ?Referring Provider (PT): Samuel Bouche ? ? ?Encounter Date: 10/14/2021 ? ? PT End of Session - 10/14/21 1052   ? ? Visit Number 15   ? Number of Visits 17   ? Date for PT Re-Evaluation 10/21/21   ? Authorization - Visit Number 15   ? Progress Note Due on Visit 20   ? PT Start Time 1015   ? PT Stop Time 1105   ? PT Time Calculation (min) 50 min   ? Activity Tolerance Patient tolerated treatment well   ? Behavior During Therapy [facility] for tasks assessed/performed   ? ?  ?  ? ?  ? ? ?Past Medical History:  ?Diagnosis Date  ? Aortic atherosclerosis ([place]) 06/10/2017  ? CAD (coronary artery disease)   ? Cancer [facility])   ? Cardiomegaly 06/10/2017  ? Combined form of age-related cataract, both eyes 04/07/2017  ? Dermatochalasis of both eyelids 04/07/2017  ? Diabetes mellitus without complication ([place])   ? Diastolic heart failure, NYHA class 1 ([place])   ? GERD (gastroesophageal reflux disease)   ? Hypertension   ? Lacunar infarction ([place]) 07/20/2018  ? Left renal mass 10/04/2018  ? 1.7 cm hypoechoic mass 10/04/2018  ? Lumbar degenerative disc disease 03/09/2017  ? Myocardial infarction [facility]) 2005  ? Normocytic anemia 12/14/2018  ? Papillary thyroid carcinoma ([place])   ? Posterior vitreous detachment, right eye 04/07/2017  ? Thyroid disease   ? ? ?Past Surgical History:  ?Procedure Laterality Date  ? TOTAL THYROIDECTOMY  2016  ? ? ?There were no vitals filed for this visit. ? ? Subjective Assessment - 10/14/21 1020   ? ? Subjective Pt continues to c/o stiffness. States the best relief is moist heat   ? Currently in Pain? Yes   ? Pain Score 3    ? Pain Location Neck   ? Pain Orientation Left   ? Pain Descriptors / Indicators Tightness   ? ?  ?  ? ?  ? ? ? ? ? [facility] PT  Assessment - 10/14/21 0001   ? ?  ? Assessment  ? Medical Diagnosis parasthesias   ? Referring Provider (PT) Charna Archer, [name]   ? Onset Date/Surgical Date 10/30/20   ? Hand Dominance Right   ? Next MD Visit PRN   ?  ? AROM  ? Cervical - Right Side Bend 41   ? Cervical - Left Side Bend 25   ? ?  ?  ? ?  ? ? ? ? ? ? ? ? ? ? ? ? ? ? ? ? [place] Adult PT Treatment/Exercise - 10/14/21 0001   ? ?  ? Neck Exercises: Machines for Strengthening  ? UBE (Upper Arm Bike) L4: 1 min each direction (standing)   ?  ? Neck Exercises: Theraband  ? Other Theraband Exercises D1 and D2 flexion yellow TB x 20 bilat   ? Other Theraband Exercises scap clock yellow TB x 10 bilat, standing T, I red TB x 20   ?  ? Neck Exercises: Stretches  ? Upper Trapezius Stretch Right;Left;2 reps;20 seconds   ?  ? Moist Heat Therapy  ? Number Minutes Moist Heat 15 Minutes   ? Moist Heat Location Cervical   ?  ? Electrical Stimulation  ? Printmaker  Location cervical   ? Electrical Stimulation Action TENS   ? Electrical Stimulation Parameters to tolerance   ? Electrical Stimulation Goals Pain;Tone   ?  ? Manual Therapy  ? Joint Mobilization cervical distraction, PAs and lateral glides to improve ROM; thoracic mobs into extension   ? Soft tissue mobilization Lt cervical paraspinals, suboccipitals, upper trap/levator   ? Passive ROM cervical spine all directions   ? ?  ?  ? ?  ? ? ? ? ? ? ? ? ? ? ? ? ? ? ? PT Long Term Goals - 09/23/21 0821   ? ?  ? PT LONG TERM GOAL #1  ? Title Pt will be independent with HEP   ? Status On-going   ? Target Date 10/21/21   ?  ? PT LONG TERM GOAL #2  ? Title Pt will improve bilat UE strength to 4+/5 to tolerate yard work with decreased pain   ? Status Achieved   ? Target Date 10/21/21   ?  ? PT LONG TERM GOAL #3  ? Title Pt will improve cervical sidebending and rotation ROM by 10 degrees to perform IADLs with decreased pain   ? Status Achieved   ? ?  ?  ? ?  ? ? ? ? ? ? ? ? Plan - 10/14/21 1052   ? ? Clinical Impression  Statement Pt continues with increased mm spasticity Lt upper traps, scalanes, cervical paraspinals. cervical ROM grossly unchanged since last assessment. Added scapular clock and standing I, T to continue to progress postural strength   ? PT Next Visit Plan cervical mobility, manual/modalties, postural strength   ? PT Home Exercise Plan [email or web address]   ? Consulted and Agree with Plan of Care Patient   ? ?  ?  ? ?  ? ? ?Patient will benefit from skilled therapeutic intervention in order to improve the following deficits and impairments:    ? ?Visit Diagnosis: ?Abnormal posture ? ?Muscle weakness (generalized) ? ?Impaired sensation ? ? ? ? ?Problem List ?Patient Active Problem List  ? Diagnosis Date Noted  ? Hypotension 05/26/2021  ? Neck pain 05/26/2021  ? Atherosclerosis 03/19/2020  ? CHF (congestive heart failure) ([place]) 11/06/2019  ? Other specified disorders of kidney and ureter 04/05/2019  ? Renal insufficiency 04/05/2019  ? 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/05/2019  ? Cervicalgia 04/05/2019  ? Eustachian tube dysfunction, bilateral 04/05/2019  ? 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 White matter abnormality on MRI of brain 04/05/2019  ? Elevated creatine kinase 12/14/2018  ? Myalgia due to statin 12/14/2018  ? Normocytic anemia 12/14/2018  ? Acute reaction to situational stress 11/30/2018  ? At moderate risk for fall 10/03/2018  ? Bilateral renal cysts 10/03/2018  ? Facet arthropathy, lumbosacral 10/03/2018  ? Microscopic hematuria 10/03/2018  ? Lacunar infarction ([place]) 07/20/2018  ? Craniofacial pain 07/04/2018  ? Proteinuria 03/21/2018  ? Hematuria 03/21/2018  ? Controlled type 2 diabetes mellitus without complication, without long-term current use of insulin ([place]) 03/08/2018  ? Encounter for weight management 01/06/2018  ? Urge incontinence 08/23/2017  ? 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/23/2017  ? Urinary frequency 08/17/2017  ? Overactive bladder 08/17/2017  ? Equinus contracture of  ankle 07/27/2017  ? Hammer toes of both feet 07/27/2017  ? Dermatophytosis, nail 07/27/2017  ? Calcaneal spur of both feet 07/27/2017  ? Dysuria 07/07/2017  ? Atypical chest pain 06/10/2017  ? Aortic atherosclerosis ([place]) 06/10/2017  ? Cardiomegaly 06/10/2017  ? Vitreous detachment of right eye  05/12/2017  ? Combined form of age-related cataract, both eyes 04/07/2017  ? Dermatochalasis of both eyelids 04/07/2017  ? Posterior vitreous detachment, right eye 04/07/2017  ? Acute left-sided low back pain without sciatica 03/09/2017  ? Postural kyphosis of cervicothoracic region 03/09/2017  ? Lumbar degenerative disc disease 03/09/2017  ? Primary osteoarthritis of left knee 03/04/2017  ? Chronic pain of left knee 03/04/2017  ? Bilateral primary osteoarthritis of knee 12/15/2016  ? Bilateral lower extremity edema 12/15/2016  ? Allergy to honey bee venom 12/15/2016  ? Essential hypertension 02/16/2016  ? DM type 2 with diabetic dyslipidemia ([place]) 02/10/2016  ? Gastroesophageal reflux disease without esophagitis 02/10/2016  ? Breast pain, left 12/30/2015  ? 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 Diabetic sensorimotor polyneuropathy ([place]) 10/27/2015  ? Papillary thyroid carcinoma ([place]) 08/09/2014  ? Post-menopausal atrophic vaginitis 06/24/2014  ? Left thyroid nodule 01/29/2014  ? Nodule of left lung 01/21/2014  ? Class 1 obesity due to excess calories with serious comorbidity and body mass index (BMI) of 30.0 to 30.9 in adult 06/12/2012  ? Palpitations 11/29/2011  ? Abnormal mammogram of right breast 08/30/2011  ? Arthritis of left knee 05/24/2011  ? Coronary artery disease involving native coronary artery of native heart with angina pectoris ([place]) 05/24/2011  ? History of acute myocardial infarction of inferior wall 05/24/2011  ? ? ?Macey Wurtz, PT ?10/14/2021, 10:54 AM ? ?[place] ?[facility] ?[address] ?[place], [place], [address] ?Phone: [phone]   Fax:   [phone] ? ?Name: Sara Gardner ?MRN: [id number] ?Date of Birth: [birth year] ? ? ? ?

## 2021-10-16 ENCOUNTER — Other Ambulatory Visit: Payer: Self-pay

## 2021-10-16 ENCOUNTER — Ambulatory Visit: Payer: Medicare Other | Admitting: Physical Therapy

## 2021-10-16 DIAGNOSIS — R293 Abnormal posture: Secondary | ICD-10-CM

## 2021-10-16 DIAGNOSIS — M6281 Muscle weakness (generalized): Secondary | ICD-10-CM

## 2021-10-16 DIAGNOSIS — R201 Hypoesthesia of skin: Secondary | ICD-10-CM

## 2021-10-16 NOTE — Therapy (Signed)
[place] ?[facility] ?[address] ?[place], [place], [address] ?Phone: [phone]   Fax:  [phone] ? ?Physical Therapy Treatment ? ?Patient Details  ?Name: Sara Gardner ?MRN: [id number] ?Date of Birth: [birth year] ?Referring Provider (PT): Samuel Bouche ? ? ?Encounter Date: 10/16/2021 ? ? PT End of Session - 10/16/21 0843   ? ? Visit Number 16   ? Number of Visits 17   ? Date for PT Re-Evaluation 10/21/21   ? Authorization - Visit Number 16   ? Progress Note Due on Visit 20   ? PT Start Time [phone]   pt arrived late  ? PT Stop Time 0855   ? PT Time Calculation (min) 45 min   ? Activity Tolerance Patient tolerated treatment well   ? Behavior During Therapy [facility] for tasks assessed/performed   ? ?  ?  ? ?  ? ? ?Past Medical History:  ?Diagnosis Date  ? Aortic atherosclerosis ([place]) 06/10/2017  ? CAD (coronary artery disease)   ? Cancer [facility])   ? Cardiomegaly 06/10/2017  ? Combined form of age-related cataract, both eyes 04/07/2017  ? Dermatochalasis of both eyelids 04/07/2017  ? Diabetes mellitus without complication ([place])   ? Diastolic heart failure, NYHA class 1 ([place])   ? GERD (gastroesophageal reflux disease)   ? Hypertension   ? Lacunar infarction ([place]) 07/20/2018  ? Left renal mass 10/04/2018  ? 1.7 cm hypoechoic mass 10/04/2018  ? Lumbar degenerative disc disease 03/09/2017  ? Myocardial infarction [facility]) 2005  ? Normocytic anemia 12/14/2018  ? Papillary thyroid carcinoma ([place])   ? Posterior vitreous detachment, right eye 04/07/2017  ? Thyroid disease   ? ? ?Past Surgical History:  ?Procedure Laterality Date  ? TOTAL THYROIDECTOMY  2016  ? ? ?There were no vitals filed for this visit. ? ? Subjective Assessment - 10/16/21 0811   ? ? Subjective Pt states she feels her neck is " a little better" today. She states she is less stiff   ? Currently in Pain? No/denies   ? ?  ?  ? ?  ? ? ? ? ? ? ? ? ? ? ? ? ? ? ? ? ? ? ? ? [facility] Adult PT Treatment/Exercise - 10/16/21 0001   ? ?   ? Neck Exercises: Machines for Strengthening  ? UBE (Upper Arm Bike) L4: 1 min each direction (standing)   ?  ? Neck Exercises: Theraband  ? Other Theraband Exercises D1 and D2 flexion yellow TB x 20 bilat   ? Other Theraband Exercises standing T, I red TB x 20, scap clock red TB x 10 bilat red TB   ?  ? Neck Exercises: Stretches  ? Upper Trapezius Stretch Right;Left;2 reps;20 seconds   ? Levator Stretch 2 reps;30 seconds   ?  ? Moist Heat Therapy  ? Number Minutes Moist Heat 10 Minutes   ? Moist Heat Location Cervical   ?  ? Electrical Stimulation  ? Electrical Stimulation Location cervical   ? Electrical Stimulation Action TENS   ? Electrical Stimulation Parameters to tolerance   ? Electrical Stimulation Goals Pain   ?  ? Manual Therapy  ? Joint Mobilization cervical distraction, PAs and lateral glides to improve ROM; thoracic mobs into extension   ? Soft tissue mobilization Lt cervical paraspinals, suboccipitals, upper trap/levator   ? Passive ROM cervical spine all directions   ? ?  ?  ? ?  ? ? ? ? ? ? ? ? ? ? ? ? ? ? ?  PT Long Term Goals - 09/23/21 0821   ? ?  ? PT LONG TERM GOAL #1  ? Title Pt will be independent with HEP   ? Status On-going   ? Target Date 10/21/21   ?  ? PT LONG TERM GOAL #2  ? Title Pt will improve bilat UE strength to 4+/5 to tolerate yard work with decreased pain   ? Status Achieved   ? Target Date 10/21/21   ?  ? PT LONG TERM GOAL #3  ? Title Pt will improve cervical sidebending and rotation ROM by 10 degrees to perform IADLs with decreased pain   ? Status Achieved   ? ?  ?  ? ?  ? ? ? ? ? ? ? ? Plan - 10/16/21 0844   ? ? Clinical Impression Statement able to increase resistance for scap clocks with pt able to maintain form. Plan to add Y's with red TB next visit. Pt continues with good response to manual therapy and modalities   ? PT Next Visit Plan RECERT, update HEP   ? PT Home Exercise Plan [email or web address]   ? Consulted and Agree with Plan of Care Patient   ? ?  ?  ? ?  ? ? ?Patient will  benefit from skilled therapeutic intervention in order to improve the following deficits and impairments:    ? ?Visit Diagnosis: ?Abnormal posture ? ?Muscle weakness (generalized) ? ?Impaired sensation ? ? ? ? ?Problem List ?Patient Active Problem List  ? Diagnosis Date Noted  ? Hypotension 05/26/2021  ? Neck pain 05/26/2021  ? Atherosclerosis 03/19/2020  ? CHF (congestive heart failure) ([place]) 11/06/2019  ? Other specified disorders of kidney and ureter 04/05/2019  ? Renal insufficiency 04/05/2019  ? 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/05/2019  ? Cervicalgia 04/05/2019  ? Eustachian tube dysfunction, bilateral 04/05/2019  ? 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 White matter abnormality on MRI of brain 04/05/2019  ? Elevated creatine kinase 12/14/2018  ? Myalgia due to statin 12/14/2018  ? Normocytic anemia 12/14/2018  ? Acute reaction to situational stress 11/30/2018  ? At moderate risk for fall 10/03/2018  ? Bilateral renal cysts 10/03/2018  ? Facet arthropathy, lumbosacral 10/03/2018  ? Microscopic hematuria 10/03/2018  ? Lacunar infarction ([place]) 07/20/2018  ? Craniofacial pain 07/04/2018  ? Proteinuria 03/21/2018  ? Hematuria 03/21/2018  ? Controlled type 2 diabetes mellitus without complication, without long-term current use of insulin ([place]) 03/08/2018  ? Encounter for weight management 01/06/2018  ? Urge incontinence 08/23/2017  ? 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/23/2017  ? Urinary frequency 08/17/2017  ? Overactive bladder 08/17/2017  ? Equinus contracture of ankle 07/27/2017  ? Hammer toes of both feet 07/27/2017  ? Dermatophytosis, nail 07/27/2017  ? Calcaneal spur of both feet 07/27/2017  ? Dysuria 07/07/2017  ? Atypical chest pain 06/10/2017  ? Aortic atherosclerosis ([place]) 06/10/2017  ? Cardiomegaly 06/10/2017  ? Vitreous detachment of right eye 05/12/2017  ? Combined form of age-related cataract, both eyes 04/07/2017  ? Dermatochalasis of both eyelids 04/07/2017  ? Posterior  vitreous detachment, right eye 04/07/2017  ? Acute left-sided low back pain without sciatica 03/09/2017  ? Postural kyphosis of cervicothoracic region 03/09/2017  ? Lumbar degenerative disc disease 03/09/2017  ? Primary osteoarthritis of left knee 03/04/2017  ? Chronic pain of left knee 03/04/2017  ? Bilateral primary osteoarthritis of knee 12/15/2016  ? Bilateral lower extremity edema 12/15/2016  ? Allergy to honey bee venom 12/15/2016  ? Essential hypertension 02/16/2016  ?  DM type 2 with diabetic dyslipidemia ([place]) 02/10/2016  ? Gastroesophageal reflux disease without esophagitis 02/10/2016  ? Breast pain, left 12/30/2015  ? 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 Diabetic sensorimotor polyneuropathy ([place]) 10/27/2015  ? Papillary thyroid carcinoma ([place]) 08/09/2014  ? Post-menopausal atrophic vaginitis 06/24/2014  ? Left thyroid nodule 01/29/2014  ? Nodule of left lung 01/21/2014  ? Class 1 obesity due to excess calories with serious comorbidity and body mass index (BMI) of 30.0 to 30.9 in adult 06/12/2012  ? Palpitations 11/29/2011  ? Abnormal mammogram of right breast 08/30/2011  ? Arthritis of left knee 05/24/2011  ? Coronary artery disease involving native coronary artery of native heart with angina pectoris ([place]) 05/24/2011  ? History of acute myocardial infarction of inferior wall 05/24/2011  ? ? ?Adan Baehr, PT ?10/16/2021, 8:45 AM ? ?[place] ?[facility] ?[address] ?[place], [place], [address] ?Phone: [phone]   Fax:  [phone] ? ?Name: Sara Gardner ?MRN: [id number] ?Date of Birth: 1950-10-28 ? ? ? ?

## 2021-10-21 ENCOUNTER — Ambulatory Visit: Payer: Medicare Other | Admitting: Physical Therapy

## 2021-10-23 ENCOUNTER — Other Ambulatory Visit: Payer: Self-pay

## 2021-10-23 ENCOUNTER — Ambulatory Visit: Payer: Medicare Other | Admitting: Physical Therapy

## 2021-10-23 DIAGNOSIS — R201 Hypoesthesia of skin: Secondary | ICD-10-CM

## 2021-10-23 DIAGNOSIS — R293 Abnormal posture: Secondary | ICD-10-CM

## 2021-10-23 DIAGNOSIS — M6281 Muscle weakness (generalized): Secondary | ICD-10-CM

## 2021-10-23 MED ORDER — BLOOD GLUCOSE METER KIT: PACK | 0 refills | Status: DC

## 2021-10-23 MED ORDER — BLOOD GLUCOSE METER KIT: PACK | 0 refills | Status: AC

## 2021-10-23 MED ORDER — ACCU-CHEK SOFTCLIX LANCETS MISC: 12 refills | Status: AC

## 2021-10-23 MED ORDER — ACCU-CHEK GUIDE VI STRP: ORAL_STRIP | 12 refills | Status: DC

## 2021-10-23 NOTE — Therapy (Signed)
[place] ?[facility] ?[address] ?[place], [place], [address] ?Phone: [phone]   Fax:  [phone] ? ?Physical Therapy Treatment and Recertification ? ?Patient Details  ?Name: Sara Gardner ?MRN: [id number] ?Date of Birth: 1950-12-13 ?Referring Provider (PT): Samuel Bouche ? ? ?Encounter Date: 10/23/2021 ? ? PT End of Session - 10/23/21 0841   ? ? Visit Number 17   ? Number of Visits 25   ? Date for PT Re-Evaluation 11/20/21   ? Authorization - Visit Number 17   ? Progress Note Due on Visit 20   ? PT Start Time [phone]   ? PT Stop Time 0845   ? PT Time Calculation (min) 42 min   ? Activity Tolerance Patient tolerated treatment well   ? Behavior During Therapy [facility] for tasks assessed/performed   ? ?  ?  ? ?  ? ? ?Past Medical History:  ?Diagnosis Date  ? Aortic atherosclerosis ([place]) 06/10/2017  ? CAD (coronary artery disease)   ? Cancer [facility])   ? Cardiomegaly 06/10/2017  ? Combined form of age-related cataract, both eyes 04/07/2017  ? Dermatochalasis of both eyelids 04/07/2017  ? Diabetes mellitus without complication ([place])   ? Diastolic heart failure, NYHA class 1 ([place])   ? GERD (gastroesophageal reflux disease)   ? Hypertension   ? Lacunar infarction ([place]) 07/20/2018  ? Left renal mass 10/04/2018  ? 1.7 cm hypoechoic mass 10/04/2018  ? Lumbar degenerative disc disease 03/09/2017  ? Myocardial infarction [facility]) 2005  ? Normocytic anemia 12/14/2018  ? Papillary thyroid carcinoma ([place])   ? Posterior vitreous detachment, right eye 04/07/2017  ? Thyroid disease   ? ? ?Past Surgical History:  ?Procedure Laterality Date  ? TOTAL THYROIDECTOMY  2016  ? ? ?There were no vitals filed for this visit. ? ? Subjective Assessment - 10/23/21 0808   ? ? Subjective Pt states her neck continues to feel "a little looser"   ? Patient Stated Goals reduce tingling/burning   ? Currently in Pain? No/denies   ? ?  ?  ? ?  ? ? ? ? ? [facility] PT Assessment - 10/23/21 0001   ? ?  ? Assessment  ?  Medical Diagnosis parasthesias   ? Referring Provider (PT) Charna Archer, [name]   ? Onset Date/Surgical Date 10/30/20   ? Hand Dominance Right   ? Next MD Visit PRN   ?  ? AROM  ? Cervical Flexion 45   ? Cervical Extension 35   ? Cervical - Right Side Bend 39   ? Cervical - Left Side Bend 38   ? Cervical - Right Rotation 51   ? Cervical - Left Rotation 48   ?  ? Palpation  ? Palpation comment increased mm spasticity Lt upper trap   ? ?  ?  ? ?  ? ? ? ? ? ? ? ? ? ? ? ? ? ? ? ? [place] Adult PT Treatment/Exercise - 10/23/21 0001   ? ?  ? Neck Exercises: Machines for Strengthening  ? UBE (Upper Arm Bike) L4: 1 min each direction (standing)   ?  ? Neck Exercises: Theraband  ? Other Theraband Exercises D1 and D2 flexion yellow TB x 20 bilat   ? Other Theraband Exercises standing T, I red TB x 20, scap clock red TB x 10 bilat red TB   ?  ? Neck Exercises: Standing  ? Other Standing Exercises scaption x 10 bilat 2#   ?  ?  Moist Heat Therapy  ? Number Minutes Moist Heat 10 Minutes   ? Moist Heat Location Cervical   ?  ? Electrical Stimulation  ? Electrical Stimulation Location cervical   ? Electrical Stimulation Action TENS   ? Electrical Stimulation Parameters to tolerance   ? Electrical Stimulation Goals Pain   ?  ? Manual Therapy  ? Soft tissue mobilization Lt cervical paraspinals, suboccipitals, upper trap/levator   ? Passive ROM cervical spine all directions   ? ?  ?  ? ?  ? ? ? ? ? ? ? ? ? ? ? ? ? ? ? PT Long Term Goals - 10/23/21 0839   ? ?  ? PT LONG TERM GOAL #1  ? Title Pt will be independent with HEP   ? Time 4   ? Period Weeks   ? Status On-going   ? Target Date 11/20/21   ?  ? PT LONG TERM GOAL #2  ? Title Pt will improve bilat UE strength to 4+/5 to tolerate yard work with decreased pain   ? Status Achieved   ?  ? PT LONG TERM GOAL #3  ? Title Pt will improve cervical sidebending and rotation ROM by 10 degrees to perform IADLs with decreased pain   ? Status Achieved   ?  ? PT LONG TERM GOAL #4  ? Title Pt will report  decreased tightness in Lt cervical musculature 6/7 days each week   ? Time 4   ? Period Weeks   ? Status New   ? Target Date 11/20/21   ? ?  ?  ? ?  ? ? ? ? ? ? ? ? Plan - 10/23/21 0842   ? ? Clinical Impression Statement Pt with good tolerance to addition of scaption today with weight. She demo's improved cervical ROM and improving postural strenght. Pt is progressing towards LTGs and will benefit from continued skilled PT to address remaining deficits   ? PT Frequency 2x / week   ? PT Duration 4 weeks   ? PT Treatment/Interventions Taping;Dry needling;Manual techniques;Passive range of motion;Patient/family education;Therapeutic exercise;Therapeutic activities;Neuromuscular re-education;Traction;Moist Heat;Electrical Stimulation;Cryotherapy   ? PT Next Visit Plan update HEP, postural strength   ? PT Home Exercise Plan [email or web address]   ? Consulted and Agree with Plan of Care Patient   ? ?  ?  ? ?  ? ? ?Patient will benefit from skilled therapeutic intervention in order to improve the following deficits and impairments:  Pain, Postural dysfunction, Decreased strength, Decreased activity tolerance, Decreased range of motion, Hypomobility ? ?Visit Diagnosis: ?Abnormal posture - Plan: PT plan of care cert/re-cert ? ?Muscle weakness (generalized) - Plan: PT plan of care cert/re-cert ? ?Impaired sensation - Plan: PT plan of care cert/re-cert ? ? ? ? ?Problem List ?Patient Active Problem List  ? Diagnosis Date Noted  ? Hypotension 05/26/2021  ? Neck pain 05/26/2021  ? Atherosclerosis 03/19/2020  ? CHF (congestive heart failure) ([place]) 11/06/2019  ? Other specified disorders of kidney and ureter 04/05/2019  ? Renal insufficiency 04/05/2019  ? 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/05/2019  ? Cervicalgia 04/05/2019  ? Eustachian tube dysfunction, bilateral 04/05/2019  ? 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 White matter abnormality on MRI of brain 04/05/2019  ? Elevated creatine kinase 12/14/2018  ? Myalgia due to statin  12/14/2018  ? Normocytic anemia 12/14/2018  ? Acute reaction to situational stress 11/30/2018  ? At moderate risk for fall 10/03/2018  ? Bilateral renal cysts 10/03/2018  ? Facet  arthropathy, lumbosacral 10/03/2018  ? Microscopic hematuria 10/03/2018  ? Lacunar infarction ([place]) 07/20/2018  ? Craniofacial pain 07/04/2018  ? Proteinuria 03/21/2018  ? Hematuria 03/21/2018  ? Controlled type 2 diabetes mellitus without complication, without long-term current use of insulin ([place]) 03/08/2018  ? Encounter for weight management 01/06/2018  ? Urge incontinence 08/23/2017  ? 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/23/2017  ? Urinary frequency 08/17/2017  ? Overactive bladder 08/17/2017  ? Equinus contracture of ankle 07/27/2017  ? Hammer toes of both feet 07/27/2017  ? Dermatophytosis, nail 07/27/2017  ? Calcaneal spur of both feet 07/27/2017  ? Dysuria 07/07/2017  ? Atypical chest pain 06/10/2017  ? Aortic atherosclerosis ([place]) 06/10/2017  ? Cardiomegaly 06/10/2017  ? Vitreous detachment of right eye 05/12/2017  ? Combined form of age-related cataract, both eyes 04/07/2017  ? Dermatochalasis of both eyelids 04/07/2017  ? Posterior vitreous detachment, right eye 04/07/2017  ? Acute left-sided low back pain without sciatica 03/09/2017  ? Postural kyphosis of cervicothoracic region 03/09/2017  ? Lumbar degenerative disc disease 03/09/2017  ? Primary osteoarthritis of left knee 03/04/2017  ? Chronic pain of left knee 03/04/2017  ? Bilateral primary osteoarthritis of knee 12/15/2016  ? Bilateral lower extremity edema 12/15/2016  ? Allergy to honey bee venom 12/15/2016  ? Essential hypertension 02/16/2016  ? DM type 2 with diabetic dyslipidemia ([place]) 02/10/2016  ? Gastroesophageal reflux disease without esophagitis 02/10/2016  ? Breast pain, left 12/30/2015  ? 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 Diabetic sensorimotor polyneuropathy ([place]) 10/27/2015  ? Papillary thyroid carcinoma ([place]) 08/09/2014  ?  Post-menopausal atrophic vaginitis 06/24/2014  ? Left thyroid nodule 01/29/2014  ? Nodule of left lung 01/21/2014  ? Class 1 obesity due to excess calories with serious comorbidity and body mass index (BMI) of 30.0 to 30.9 in adul

## 2021-10-28 ENCOUNTER — Ambulatory Visit: Payer: Medicare Other | Attending: Medical-Surgical | Admitting: Physical Therapy

## 2021-10-28 DIAGNOSIS — R293 Abnormal posture: Secondary | ICD-10-CM | POA: Insufficient documentation

## 2021-10-28 DIAGNOSIS — R201 Hypoesthesia of skin: Secondary | ICD-10-CM | POA: Diagnosis present

## 2021-10-28 DIAGNOSIS — M6281 Muscle weakness (generalized): Secondary | ICD-10-CM | POA: Insufficient documentation

## 2021-10-28 NOTE — Therapy (Signed)
[facility] ?[address] ?[place], [place], [address] ?Phone: [phone]   Fax:  [phone] ? ?Physical Therapy Treatment ? ?Patient Details  ?Name: Sara Gardner ?MRN: [id number] ?Date of Birth: 1950/10/22 ?Referring Provider (PT): Samuel Bouche ? ? ?Encounter Date: 10/28/2021 ? ? PT End of Session - 10/28/21 1049   ? ? Visit Number 18   ? Number of Visits 25   ? Date for PT Re-Evaluation 11/20/21   ? Authorization - Visit Number 18   ? Progress Note Due on Visit 20   ? PT Start Time 1015   ? PT Stop Time 1100   ? PT Time Calculation (min) 45 min   ? Activity Tolerance Patient tolerated treatment well   ? Behavior During Therapy [facility] for tasks assessed/performed   ? ?  ?  ? ?  ? ? ?Past Medical History:  ?Diagnosis Date  ? Aortic atherosclerosis ([place]) 06/10/2017  ? CAD (coronary artery disease)   ? Cancer [facility])   ? Cardiomegaly 06/10/2017  ? Combined form of age-related cataract, both eyes 04/07/2017  ? Dermatochalasis of both eyelids 04/07/2017  ? Diabetes mellitus without complication ([place])   ? Diastolic heart failure, NYHA class 1 ([place])   ? GERD (gastroesophageal reflux disease)   ? Hypertension   ? Lacunar infarction ([place]) 07/20/2018  ? Left renal mass 10/04/2018  ? 1.7 cm hypoechoic mass 10/04/2018  ? Lumbar degenerative disc disease 03/09/2017  ? Myocardial infarction [facility]) 2005  ? Normocytic anemia 12/14/2018  ? Papillary thyroid carcinoma ([place])   ? Posterior vitreous detachment, right eye 04/07/2017  ? Thyroid disease   ? ? ?Past Surgical History:  ?Procedure Laterality Date  ? TOTAL THYROIDECTOMY  2016  ? ? ?There were no vitals filed for this visit. ? ? Subjective Assessment - 10/28/21 1019   ? ? Subjective Pt states "I'm tight again". She states " a lot has been going on"   ? Patient Stated Goals reduce tingling/burning   ? Currently in Pain? Yes   ? Pain Score 3    ? Pain Location Neck   ? Pain Orientation Posterior;Left   ? Pain Descriptors /  Indicators Tightness   ? ?  ?  ? ?  ? ? ? ? ? ? ? ? ? ? ? ? ? ? ? ? ? ? ? ? [place] Adult PT Treatment/Exercise - 10/28/21 0001   ? ?  ? Neck Exercises: Machines for Strengthening  ? UBE (Upper Arm Bike) L4: 1 min each direction (standing)   ?  ? Neck Exercises: Theraband  ? Other Theraband Exercises D1 and D2 flexion yellow TB x 20 bilat   ?  ? Neck Exercises: Standing  ? Other Standing Exercises scaption x 15 bilat 2#   ? Other Standing Exercises diagonals x 10 red TB   ?  ? Neck Exercises: Stretches  ? Upper Trapezius Stretch Right;Left;3 reps;20 seconds   ? Levator Stretch Right;Left;3 reps;20 seconds   ?  ? Moist Heat Therapy  ? Number Minutes Moist Heat 10 Minutes   ? Moist Heat Location Cervical   ?  ? Electrical Stimulation  ? Electrical Stimulation Location cervical   ? Electrical Stimulation Action TENS   ? Electrical Stimulation Parameters to tolerance   ? Electrical Stimulation Goals Pain   ?  ? Manual Therapy  ? Soft tissue mobilization Lt cervical paraspinals, suboccipitals, upper trap/levator   ? Passive ROM cervical spine all directions   ? ?  ?  ? ?  ? ? ? ? ? ? ? ? ? ? ? ? ? ? ?  PT Long Term Goals - 10/23/21 0839   ? ?  ? PT LONG TERM GOAL #1  ? Title Pt will be independent with HEP   ? Time 4   ? Period Weeks   ? Status On-going   ? Target Date 11/20/21   ?  ? PT LONG TERM GOAL #2  ? Title Pt will improve bilat UE strength to 4+/5 to tolerate yard work with decreased pain   ? Status Achieved   ?  ? PT LONG TERM GOAL #3  ? Title Pt will improve cervical sidebending and rotation ROM by 10 degrees to perform IADLs with decreased pain   ? Status Achieved   ?  ? PT LONG TERM GOAL #4  ? Title Pt will report decreased tightness in Lt cervical musculature 6/7 days each week   ? Time 4   ? Period Weeks   ? Status New   ? Target Date 11/20/21   ? ?  ?  ? ?  ? ? ? ? ? ? ? ? Plan - 10/28/21 1050   ? ? Clinical Impression Statement Pt with increased mm spasticity in Lt upper traps and levator today. She responds  well to manual and modalities to reduce pain and tone   ? PT Next Visit Plan update HEP, postural strength   ? PT Home Exercise Plan [id number]   ? Consulted and Agree with Plan of Care Patient   ? ?  ?  ? ?  ? ? ?Patient will benefit from skilled therapeutic intervention in order to improve the following deficits and impairments:    ? ?Visit Diagnosis: ?Abnormal posture ? ?Muscle weakness (generalized) ? ?Impaired sensation ? ? ? ? ?Problem List ?Patient Active Problem List  ? Diagnosis Date Noted  ? Hypotension 05/26/2021  ? Neck pain 05/26/2021  ? Atherosclerosis 03/19/2020  ? CHF (congestive heart failure) ([place]) 11/06/2019  ? Other specified disorders of kidney and ureter 04/05/2019  ? Renal insufficiency 04/05/2019  ? 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/05/2019  ? Cervicalgia 04/05/2019  ? Eustachian tube dysfunction, bilateral 04/05/2019  ? 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 White matter abnormality on MRI of brain 04/05/2019  ? Elevated creatine kinase 12/14/2018  ? Myalgia due to statin 12/14/2018  ? Normocytic anemia 12/14/2018  ? Acute reaction to situational stress 11/30/2018  ? At moderate risk for fall 10/03/2018  ? Bilateral renal cysts 10/03/2018  ? Facet arthropathy, lumbosacral 10/03/2018  ? Microscopic hematuria 10/03/2018  ? Lacunar infarction ([place]) 07/20/2018  ? Craniofacial pain 07/04/2018  ? Proteinuria 03/21/2018  ? Hematuria 03/21/2018  ? Controlled type 2 diabetes mellitus without complication, without long-term current use of insulin ([place]) 03/08/2018  ? Encounter for weight management 01/06/2018  ? Urge incontinence 08/23/2017  ? 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/23/2017  ? Urinary frequency 08/17/2017  ? Overactive bladder 08/17/2017  ? Equinus contracture of ankle 07/27/2017  ? Hammer toes of both feet 07/27/2017  ? Dermatophytosis, nail 07/27/2017  ? Calcaneal spur of both feet 07/27/2017  ? Dysuria 07/07/2017  ? Atypical chest pain 06/10/2017  ? Aortic  atherosclerosis ([place]) 06/10/2017  ? Cardiomegaly 06/10/2017  ? Vitreous detachment of right eye 05/12/2017  ? Combined form of age-related cataract, both eyes 04/07/2017  ? Dermatochalasis of both eyelids 04/07/2017  ? Posterior vitreous detachment, right eye 04/07/2017  ? Acute left-sided low back pain without sciatica 03/09/2017  ? Postural kyphosis of cervicothoracic region 03/09/2017  ? Lumbar degenerative disc disease 03/09/2017  ?  Primary osteoarthritis of left knee 03/04/2017  ? Chronic pain of left knee 03/04/2017  ? Bilateral primary osteoarthritis of knee 12/15/2016  ? Bilateral lower extremity edema 12/15/2016  ? Allergy to honey bee venom 12/15/2016  ? Essential hypertension 02/16/2016  ? DM type 2 with diabetic dyslipidemia ([place]) 02/10/2016  ? Gastroesophageal reflux disease without esophagitis 02/10/2016  ? Breast pain, left 12/30/2015  ? 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 Diabetic sensorimotor polyneuropathy ([place]) 10/27/2015  ? Papillary thyroid carcinoma ([place]) 08/09/2014  ? Post-menopausal atrophic vaginitis 06/24/2014  ? Left thyroid nodule 01/29/2014  ? Nodule of left lung 01/21/2014  ? Class 1 obesity due to excess calories with serious comorbidity and body mass index (BMI) of 30.0 to 30.9 in adult 06/12/2012  ? Palpitations 11/29/2011  ? Abnormal mammogram of right breast 08/30/2011  ? Arthritis of left knee 05/24/2011  ? Coronary artery disease involving native coronary artery of native heart with angina pectoris ([place]) 05/24/2011  ? History of acute myocardial infarction of inferior wall 05/24/2011  ? ? ?Mayreli Alden, PT ?10/28/2021, 10:52 AM ? ?[facility] ?[facility] ?[place] ?[place], [place], [address] ?Phone: [phone]   Fax:  [phone] ? ?Name: Sara Gardner ?MRN: [id number] ?Date of Birth: [birth year] ? ? ? ?

## 2021-10-30 ENCOUNTER — Ambulatory Visit: Payer: Medicare Other | Admitting: Physical Therapy

## 2021-10-30 DIAGNOSIS — M6281 Muscle weakness (generalized): Secondary | ICD-10-CM

## 2021-10-30 DIAGNOSIS — R293 Abnormal posture: Secondary | ICD-10-CM

## 2021-10-30 DIAGNOSIS — R201 Hypoesthesia of skin: Secondary | ICD-10-CM

## 2021-10-30 NOTE — Therapy (Signed)
[place] ?[facility] ?[address] ?[place], [place], [address] ?Phone: [phone]   Fax:  [phone] ? ?Physical Therapy Treatment ? ?Patient Details  ?Name: Sara Gardner ?MRN: [id number] ?Date of Birth: 07/23/1951 ?Referring Provider (PT): Samuel Bouche ? ? ?Encounter Date: 10/30/2021 ? ? PT End of Session - 10/30/21 0834   ? ? Visit Number 19   ? Number of Visits 25   ? Date for PT Re-Evaluation 11/20/21   ? Authorization Type [employer]   ? Authorization - Visit Number 19   ? Progress Note Due on Visit 20   ? PT Start Time 0809   pt arrived late and then had to leave early for an appt  ? PT Stop Time [phone]   ? PT Time Calculation (min) 34 min   ? Activity Tolerance Patient tolerated treatment well   ? Behavior During Therapy [facility] for tasks assessed/performed   ? ?  ?  ? ?  ? ? ?Past Medical History:  ?Diagnosis Date  ? Aortic atherosclerosis ([place]) 06/10/2017  ? CAD (coronary artery disease)   ? Cancer [facility])   ? Cardiomegaly 06/10/2017  ? Combined form of age-related cataract, both eyes 04/07/2017  ? Dermatochalasis of both eyelids 04/07/2017  ? Diabetes mellitus without complication ([place])   ? Diastolic heart failure, NYHA class 1 ([place])   ? GERD (gastroesophageal reflux disease)   ? Hypertension   ? Lacunar infarction ([place]) 07/20/2018  ? Left renal mass 10/04/2018  ? 1.7 cm hypoechoic mass 10/04/2018  ? Lumbar degenerative disc disease 03/09/2017  ? Myocardial infarction [facility]) 2005  ? Normocytic anemia 12/14/2018  ? Papillary thyroid carcinoma ([place])   ? Posterior vitreous detachment, right eye 04/07/2017  ? Thyroid disease   ? ? ?Past Surgical History:  ?Procedure Laterality Date  ? TOTAL THYROIDECTOMY  2016  ? ? ?There were no vitals filed for this visit. ? ? Subjective Assessment - 10/30/21 0811   ? ? Subjective Pt states she felt more loose after last visit but has been doing a lot of driving the past 2 days   ? Patient Stated Goals reduce tingling/burning   ? Currently  in Pain? Yes   ? Pain Score 2    ? Pain Location Neck   ? Pain Orientation Left   ? Pain Descriptors / Indicators Tightness   ? ?  ?  ? ?  ? ? ? ? ? [facility] PT Assessment - 10/30/21 0001   ? ?  ? Assessment  ? Medical Diagnosis parasthesias   ? Referring Provider (PT) Charna Archer, [name]   ? Onset Date/Surgical Date 10/30/20   ? Hand Dominance Right   ? Next MD Visit PRN   ?  ? Palpation  ? Palpation comment increased mm spasticity Lt upper trap   ? ?  ?  ? ?  ? ? ? ? ? ? ? ? ? ? ? ? ? ? ? ? [place] Adult PT Treatment/Exercise - 10/30/21 0001   ? ?  ? Neck Exercises: Machines for Strengthening  ? UBE (Upper Arm Bike) L4: 1 min each direction (standing)   ?  ? Neck Exercises: Theraband  ? Other Theraband Exercises diagonals red TB x 20 bilat   ?  ? Neck Exercises: Standing  ? Other Standing Exercises scaption x 15 bilat 2#   ? Other Standing Exercises standing rows red TB 2 x 10   ?  ? Neck Exercises: Seated  ? Other Seated Exercise  horizontal abd x 20 red TB, bilat ER red TB x 20 red TB   ?  ? Moist Heat Therapy  ? Number Minutes Moist Heat 10 Minutes   ? Moist Heat Location Cervical   ?  ? Electrical Stimulation  ? Electrical Stimulation Location cervical   ? Electrical Stimulation Action TENS   ? Electrical Stimulation Parameters to tolerance   ? Electrical Stimulation Goals Pain   ?  ? Manual Therapy  ? Soft tissue mobilization Lt cervical paraspinals, suboccipitals, upper trap/levator   ? ?  ?  ? ?  ? ? ? ? ? ? ? ? ? ? ? ? ? ? ? PT Long Term Goals - 10/23/21 0839   ? ?  ? PT LONG TERM GOAL #1  ? Title Pt will be independent with HEP   ? Time 4   ? Period Weeks   ? Status On-going   ? Target Date 11/20/21   ?  ? PT LONG TERM GOAL #2  ? Title Pt will improve bilat UE strength to 4+/5 to tolerate yard work with decreased pain   ? Status Achieved   ?  ? PT LONG TERM GOAL #3  ? Title Pt will improve cervical sidebending and rotation ROM by 10 degrees to perform IADLs with decreased pain   ? Status Achieved   ?  ? PT LONG TERM GOAL  #4  ? Title Pt will report decreased tightness in Lt cervical musculature 6/7 days each week   ? Time 4   ? Period Weeks   ? Status New   ? Target Date 11/20/21   ? ?  ?  ? ?  ? ? ? ? ? ? ? ? Plan - 10/30/21 0835   ? ? Clinical Impression Statement Pt able to tolerate exercises with no increased pain today. requires cues for posture with diagonals.Session short today due to pt arriving late and having to leave early. Will return to full exercise routine next visit   ? PT Next Visit Plan update HEP, postural strength   ? PT Home Exercise Plan [email or web address]   ? Consulted and Agree with Plan of Care Patient   ? ?  ?  ? ?  ? ? ?Patient will benefit from skilled therapeutic intervention in order to improve the following deficits and impairments:    ? ?Visit Diagnosis: ?Abnormal posture ? ?Muscle weakness (generalized) ? ?Impaired sensation ? ? ? ? ?Problem List ?Patient Active Problem List  ? Diagnosis Date Noted  ? Hypotension 05/26/2021  ? Neck pain 05/26/2021  ? Atherosclerosis 03/19/2020  ? CHF (congestive heart failure) ([place]) 11/06/2019  ? Other specified disorders of kidney and ureter 04/05/2019  ? Renal insufficiency 04/05/2019  ? 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/05/2019  ? Cervicalgia 04/05/2019  ? Eustachian tube dysfunction, bilateral 04/05/2019  ? 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 White matter abnormality on MRI of brain 04/05/2019  ? Elevated creatine kinase 12/14/2018  ? Myalgia due to statin 12/14/2018  ? Normocytic anemia 12/14/2018  ? Acute reaction to situational stress 11/30/2018  ? At moderate risk for fall 10/03/2018  ? Bilateral renal cysts 10/03/2018  ? Facet arthropathy, lumbosacral 10/03/2018  ? Microscopic hematuria 10/03/2018  ? Lacunar infarction ([place]) 07/20/2018  ? Craniofacial pain 07/04/2018  ? Proteinuria 03/21/2018  ? Hematuria 03/21/2018  ? Controlled type 2 diabetes mellitus without complication, without long-term current use of insulin ([place]) 03/08/2018  ? Encounter for weight  management 01/06/2018  ? Urge  incontinence 08/23/2017  ? 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/23/2017  ? Urinary frequency 08/17/2017  ? Overactive bladder 08/17/2017  ? Equinus contracture of ankle 07/27/2017  ? Hammer toes of both feet 07/27/2017  ? Dermatophytosis, nail 07/27/2017  ? Calcaneal spur of both feet 07/27/2017  ? Dysuria 07/07/2017  ? Atypical chest pain 06/10/2017  ? Aortic atherosclerosis ([place]) 06/10/2017  ? Cardiomegaly 06/10/2017  ? Vitreous detachment of right eye 05/12/2017  ? Combined form of age-related cataract, both eyes 04/07/2017  ? Dermatochalasis of both eyelids 04/07/2017  ? Posterior vitreous detachment, right eye 04/07/2017  ? Acute left-sided low back pain without sciatica 03/09/2017  ? Postural kyphosis of cervicothoracic region 03/09/2017  ? Lumbar degenerative disc disease 03/09/2017  ? Primary osteoarthritis of left knee 03/04/2017  ? Chronic pain of left knee 03/04/2017  ? Bilateral primary osteoarthritis of knee 12/15/2016  ? Bilateral lower extremity edema 12/15/2016  ? Allergy to honey bee venom 12/15/2016  ? Essential hypertension 02/16/2016  ? DM type 2 with diabetic dyslipidemia ([place]) 02/10/2016  ? Gastroesophageal reflux disease without esophagitis 02/10/2016  ? Breast pain, left 12/30/2015  ? 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 Diabetic sensorimotor polyneuropathy ([place]) 10/27/2015  ? Papillary thyroid carcinoma ([place]) 08/09/2014  ? Post-menopausal atrophic vaginitis 06/24/2014  ? Left thyroid nodule 01/29/2014  ? Nodule of left lung 01/21/2014  ? Class 1 obesity due to excess calories with serious comorbidity and body mass index (BMI) of 30.0 to 30.9 in adult 06/12/2012  ? Palpitations 11/29/2011  ? Abnormal mammogram of right breast 08/30/2011  ? Arthritis of left knee 05/24/2011  ? Coronary artery disease involving native coronary artery of native heart with angina pectoris ([place]) 05/24/2011  ? History of acute myocardial infarction of inferior wall  05/24/2011  ? ? ?Terell Kincy, PT ?10/30/2021, 8:37 AM ? ?[place] ?[facility] ?[address] ?[place], [place], [address] ?Phone: [phone]   Fax:  [phone]

## 2021-11-09 ENCOUNTER — Ambulatory Visit: Payer: Medicare Other | Admitting: Physical Therapy

## 2021-11-09 ENCOUNTER — Encounter: Payer: Self-pay | Admitting: Physical Therapy

## 2021-11-09 DIAGNOSIS — R293 Abnormal posture: Secondary | ICD-10-CM

## 2021-11-09 DIAGNOSIS — R201 Hypoesthesia of skin: Secondary | ICD-10-CM

## 2021-11-09 DIAGNOSIS — M6281 Muscle weakness (generalized): Secondary | ICD-10-CM

## 2021-11-09 NOTE — Therapy (Signed)
[place] ?[facility] ?[address] ?[place], [place], [address] ?Phone: [phone]   Fax:  [phone] ? ?Physical Therapy Treatment and 20th Visit Progress Note ? ?Patient Details  ?Name: Sara Gardner ?MRN: [id number] ?Date of Birth: Jun 13, 1951 ?Referring Provider (PT): Samuel Bouche ? ? ?Encounter Date: 11/09/2021 ? ?Physical Therapy Progress Note ? ?Dates of Reporting Period: 09/25/21 to 11/09/21 ? ? ? PT End of Session - 11/09/21 0801   ? ? Visit Number 20   ? Number of Visits 25   ? Date for PT Re-Evaluation 11/20/21   ? Authorization Type UHC   ? Authorization - Visit Number 20   ? PT Start Time 0802   ? PT Stop Time 1638   ? PT Time Calculation (min) 47 min   ? Activity Tolerance Patient tolerated treatment well   ? Behavior During Therapy [facility] for tasks assessed/performed   ? ?  ?  ? ?  ? ? ?Past Medical History:  ?Diagnosis Date  ? Aortic atherosclerosis ([place]) 06/10/2017  ? CAD (coronary artery disease)   ? Cancer [facility])   ? Cardiomegaly 06/10/2017  ? Combined form of age-related cataract, both eyes 04/07/2017  ? Dermatochalasis of both eyelids 04/07/2017  ? Diabetes mellitus without complication ([place])   ? Diastolic heart failure, NYHA class 1 ([place])   ? GERD (gastroesophageal reflux disease)   ? Hypertension   ? Lacunar infarction ([place]) 07/20/2018  ? Left renal mass 10/04/2018  ? 1.7 cm hypoechoic mass 10/04/2018  ? Lumbar degenerative disc disease 03/09/2017  ? Myocardial infarction [facility]) 2005  ? Normocytic anemia 12/14/2018  ? Papillary thyroid carcinoma ([place])   ? Posterior vitreous detachment, right eye 04/07/2017  ? Thyroid disease   ? ? ?Past Surgical History:  ?Procedure Laterality Date  ? TOTAL THYROIDECTOMY  2016  ? ? ?There were no vitals filed for this visit. ? ? Subjective Assessment - 11/09/21 0802   ? ? Subjective Patient feels she is making improvements. She is always stiff on the left side of neck.   ? Patient Stated Goals reduce tingling/burning   ?  Currently in Pain? Yes   ? Pain Score 6    ? Pain Location Neck   ? Pain Orientation Left   ? Pain Descriptors / Indicators Tightness   ? ?  ?  ? ?  ? ? ? ? ? [facility] PT Assessment - 11/09/21 0001   ? ?  ? Palpation  ? Spinal mobility mild decrease in PA mobility with UPA glides on left compared to right   ? Palpation comment marked tightness in left UT and levator scapula as well as scalenes   ? ?  ?  ? ?  ? ? ? ? ? ? ? ? ? ? ? ? ? ? ? ? [place] Adult PT Treatment/Exercise - 11/09/21 0001   ? ?  ? Neck Exercises: Machines for Strengthening  ? UBE (Upper Arm Bike) L4: 1 min each direction (standing)   ?  ? Neck Exercises: Seated  ? Other Seated Exercise horizontal abd x 20 red TB, bilat ER red TB x 20 red TB; diagonals x 10 ea; ext x 20; scaption 23 x 20   ?  ? Moist Heat Therapy  ? Number Minutes Moist Heat 10 Minutes   ? Moist Heat Location Cervical   ?  ? Electrical Stimulation  ? Programmer, multimedia cer   ? Electrical Stimulation Action TENS   ? Printmaker  Parameters to tolerance   ? Electrical Stimulation Goals Pain   ?  ? Manual Therapy  ? Manual Therapy Joint mobilization;Soft tissue mobilization   ? Joint Mobilization gd II/III/IV to bil cervical; Gd II/III to low back   ? Soft tissue mobilization Lt cervical paraspinals, scalenes, suboccipitals, upper trap/levator   ? ?  ?  ? ?  ? ? ? ? ? ? ? ? ? ? ? ? ? ? ? PT Long Term Goals - 11/09/21 0804   ? ?  ? PT LONG TERM GOAL #1  ? Title Pt will be independent with HEP   ? Status Partially Met   ?  ? PT LONG TERM GOAL #2  ? Title Pt will improve bilat UE strength to 4+/5 to tolerate yard work with decreased pain   ? Status Achieved   ?  ? PT LONG TERM GOAL #3  ? Title Pt will improve cervical sidebending and rotation ROM by 10 degrees to perform IADLs with decreased pain   ? Status Achieved   ?  ? PT LONG TERM GOAL #4  ? Title Pt will report decreased tightness in Lt cervical musculature 6/7 days each week   ? Baseline pt reports 50% improvement  in stiffness   ? Status On-going   ?  ? PT LONG TERM GOAL #5  ? Title Improve FOTO to </= 35% limitation 10/28/17   ? Status Achieved   ? ?  ?  ? ?  ? ? ? ? ? ? ? ? Plan - 11/09/21 0845   ? ? Clinical Impression Statement Patient continues to make progress with LTGs reporting 50% improvement in neck stiffness since last assessement. She still has significant tension and tightness in the left neck musculature affecting her ADLS. She reports compliance with HEP, however still requires cueing for correct form. She also has signficant tightness in her low back, but did not want to address this today. PT demonstrated seated SB stretch for her. She will continue to benefit from skilled PT to meet her final two unmet LTGs.   ? PT Frequency 2x / week   ? PT Duration 4 weeks   ? PT Treatment/Interventions Taping;Dry needling;Manual techniques;Passive range of motion;Patient/family education;Therapeutic exercise;Therapeutic activities;Neuromuscular re-education;Traction;Moist Heat;Electrical Stimulation;Cryotherapy   ? PT Next Visit Plan update HEP, postural strength   ? PT Home Exercise Plan [email or web address]   ? Consulted and Agree with Plan of Care Patient   ? ?  ?  ? ?  ? ? ?Patient will benefit from skilled therapeutic intervention in order to improve the following deficits and impairments:  Pain, Postural dysfunction, Decreased strength, Decreased activity tolerance, Decreased range of motion, Hypomobility ? ?Visit Diagnosis: ?Abnormal posture ? ?Muscle weakness (generalized) ? ?Impaired sensation ? ? ? ? ?Problem List ?Patient Active Problem List  ? Diagnosis Date Noted  ? Hypotension 05/26/2021  ? Neck pain 05/26/2021  ? Atherosclerosis 03/19/2020  ? CHF (congestive heart failure) ([place]) 11/06/2019  ? Other specified disorders of kidney and ureter 04/05/2019  ? Renal insufficiency 04/05/2019  ? 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/05/2019  ? Cervicalgia 04/05/2019  ? Eustachian tube dysfunction, bilateral 04/05/2019  ?  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 White matter abnormality on MRI of brain 04/05/2019  ? Elevated creatine kinase 12/14/2018  ? Myalgia due to statin 12/14/2018  ? Normocytic anemia 12/14/2018  ? Acute reaction to situational stress 11/30/2018  ? At moderate risk for fall 10/03/2018  ? Bilateral renal cysts 10/03/2018  ?  Facet arthropathy, lumbosacral 10/03/2018  ? Microscopic hematuria 10/03/2018  ? Lacunar infarction ([place]) 07/20/2018  ? Craniofacial pain 07/04/2018  ? Proteinuria 03/21/2018  ? Hematuria 03/21/2018  ? Controlled type 2 diabetes mellitus without complication, without long-term current use of insulin ([place]) 03/08/2018  ? Encounter for weight management 01/06/2018  ? Urge incontinence 08/23/2017  ? 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/23/2017  ? Urinary frequency 08/17/2017  ? Overactive bladder 08/17/2017  ? Equinus contracture of ankle 07/27/2017  ? Hammer toes of both feet 07/27/2017  ? Dermatophytosis, nail 07/27/2017  ? Calcaneal spur of both feet 07/27/2017  ? Dysuria 07/07/2017  ? Atypical chest pain 06/10/2017  ? Aortic atherosclerosis ([place]) 06/10/2017  ? Cardiomegaly 06/10/2017  ? Vitreous detachment of right eye 05/12/2017  ? Combined form of age-related cataract, both eyes 04/07/2017  ? Dermatochalasis of both eyelids 04/07/2017  ? Posterior vitreous detachment, right eye 04/07/2017  ? Acute left-sided low back pain without sciatica 03/09/2017  ? Postural kyphosis of cervicothoracic region 03/09/2017  ? Lumbar degenerative disc disease 03/09/2017  ? Primary osteoarthritis of left knee 03/04/2017  ? Chronic pain of left knee 03/04/2017  ? Bilateral primary osteoarthritis of knee 12/15/2016  ? Bilateral lower extremity edema 12/15/2016  ? Allergy to honey bee venom 12/15/2016  ? Essential hypertension 02/16/2016  ? DM type 2 with diabetic dyslipidemia ([place]) 02/10/2016  ? Gastroesophageal reflux disease without esophagitis 02/10/2016  ? Breast pain, left 12/30/2015  ?  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 Diabetic sensorimotor polyneuropathy ([place]) 10/27/2015  ? Papillary thyroid carcinoma ([employer]) 08/09/2014  ? Post-menopausal atrophic vaginitis 06/24/2014  ? Left thyroid no

## 2021-11-11 ENCOUNTER — Ambulatory Visit: Payer: Medicare Other | Admitting: Physical Therapy

## 2021-11-11 DIAGNOSIS — R293 Abnormal posture: Secondary | ICD-10-CM | POA: Diagnosis not present

## 2021-11-11 DIAGNOSIS — M6281 Muscle weakness (generalized): Secondary | ICD-10-CM

## 2021-11-11 DIAGNOSIS — R201 Hypoesthesia of skin: Secondary | ICD-10-CM

## 2021-11-11 NOTE — Therapy (Signed)
[place] ?[facility] ?[address] ?[place], [place], [address] ?Phone: [phone]   Fax:  [phone] ? ?Physical Therapy Treatment ? ?Patient Details  ?Name: Sara Gardner ?MRN: [id number] ?Date of Birth: [birth year] ?Referring Provider (PT): Samuel Bouche ? ? ?Encounter Date: 11/11/2021 ? ? PT End of Session - 11/11/21 1005   ? ? Visit Number 21   ? Number of Visits 25   ? Date for PT Re-Evaluation 11/20/21   ? Authorization - Visit Number 21   ? Progress Note Due on Visit 30   ? PT Start Time 0930   ? PT Stop Time 1015   ? PT Time Calculation (min) 45 min   ? Activity Tolerance Patient tolerated treatment well   ? Behavior During Therapy [facility] for tasks assessed/performed   ? ?  ?  ? ?  ? ? ?Past Medical History:  ?Diagnosis Date  ? Aortic atherosclerosis ([place]) 06/10/2017  ? CAD (coronary artery disease)   ? Cancer [facility])   ? Cardiomegaly 06/10/2017  ? Combined form of age-related cataract, both eyes 04/07/2017  ? Dermatochalasis of both eyelids 04/07/2017  ? Diabetes mellitus without complication ([place])   ? Diastolic heart failure, NYHA class 1 ([place])   ? GERD (gastroesophageal reflux disease)   ? Hypertension   ? Lacunar infarction ([place]) 07/20/2018  ? Left renal mass 10/04/2018  ? 1.7 cm hypoechoic mass 10/04/2018  ? Lumbar degenerative disc disease 03/09/2017  ? Myocardial infarction [facility]) 2005  ? Normocytic anemia 12/14/2018  ? Papillary thyroid carcinoma ([place])   ? Posterior vitreous detachment, right eye 04/07/2017  ? Thyroid disease   ? ? ?Past Surgical History:  ?Procedure Laterality Date  ? TOTAL THYROIDECTOMY  2016  ? ? ?There were no vitals filed for this visit. ? ? Subjective Assessment - 11/11/21 0934   ? ? Subjective Pt states she feels "the same". Still stiff on the left side of her neck   ? Patient Stated Goals reduce tingling/burning   ? Currently in Pain? Yes   ? Pain Score 4    ? Pain Location Neck   ? Pain Orientation Left   ? Pain Descriptors /  Indicators Tightness   ? ?  ?  ? ?  ? ? ? ? ? [facility] PT Assessment - 11/11/21 0001   ? ?  ? Palpation  ? Palpation comment marked tightness in left UT and levator   ? ?  ?  ? ?  ? ? ? ? ? ? ? ? ? ? ? ? ? ? ? ? [place] Adult PT Treatment/Exercise - 11/11/21 0001   ? ?  ? Neck Exercises: Machines for Strengthening  ? UBE (Upper Arm Bike) L4: 1 min each direction (standing)   ?  ? Neck Exercises: Seated  ? Other Seated Exercise horizontal abd x 20 red TB, bilat ER red TB x 20 red TB; diagonals x 10 ea; ext x 20; scaption 2 x 20 2#   ?  ? Neck Exercises: Stretches  ? Upper Trapezius Stretch Right;Left;2 reps;30 seconds   ? Levator Stretch Right;Left;2 reps;30 seconds   ?  ? Moist Heat Therapy  ? Number Minutes Moist Heat 10 Minutes   ? Moist Heat Location Cervical   ?  ? Electrical Stimulation  ? Electrical Stimulation Location cervical   ? Electrical Stimulation Action TENS   ? Electrical Stimulation Parameters to tolerance   ? Electrical Stimulation Goals Pain   ?  ?  Manual Therapy  ? Joint Mobilization grade 2-3 PAs and lateral glides to Cervical spine   ? Soft tissue mobilization Lt cervical paraspinals, scalenes, suboccipitals, upper trap/levator   ? ?  ?  ? ?  ? ? ? ? ? ? ? ? ? ? ? ? ? ? ? PT Long Term Goals - 11/09/21 0804   ? ?  ? PT LONG TERM GOAL #1  ? Title Pt will be independent with HEP   ? Status Partially Met   ?  ? PT LONG TERM GOAL #2  ? Title Pt will improve bilat UE strength to 4+/5 to tolerate yard work with decreased pain   ? Status Achieved   ?  ? PT LONG TERM GOAL #3  ? Title Pt will improve cervical sidebending and rotation ROM by 10 degrees to perform IADLs with decreased pain   ? Status Achieved   ?  ? PT LONG TERM GOAL #4  ? Title Pt will report decreased tightness in Lt cervical musculature 6/7 days each week   ? Baseline pt reports 50% improvement in stiffness   ? Status On-going   ?  ? PT LONG TERM GOAL #5  ? Title Improve FOTO to </= 35% limitation 10/28/17   ? Status Achieved   ? ?  ?  ? ?   ? ? ? ? ? ? ? ? Plan - 11/11/21 1005   ? ? Clinical Impression Statement Pt continues to progress towards LTG's. She continues with mm tightness in Lt upper trap and levator.   ? PT Next Visit Plan update HEP, postural strength   ? PT Home Exercise Plan [email or web address]   ? Consulted and Agree with Plan of Care Patient   ? ?  ?  ? ?  ? ? ?Patient will benefit from skilled therapeutic intervention in order to improve the following deficits and impairments:    ? ?Visit Diagnosis: ?Abnormal posture ? ?Muscle weakness (generalized) ? ?Impaired sensation ? ? ? ? ?Problem List ?Patient Active Problem List  ? Diagnosis Date Noted  ? Hypotension 05/26/2021  ? Neck pain 05/26/2021  ? Atherosclerosis 03/19/2020  ? CHF (congestive heart failure) ([place]) 11/06/2019  ? Other specified disorders of kidney and ureter 04/05/2019  ? Renal insufficiency 04/05/2019  ? 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/05/2019  ? Cervicalgia 04/05/2019  ? Eustachian tube dysfunction, bilateral 04/05/2019  ? 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 White matter abnormality on MRI of brain 04/05/2019  ? Elevated creatine kinase 12/14/2018  ? Myalgia due to statin 12/14/2018  ? Normocytic anemia 12/14/2018  ? Acute reaction to situational stress 11/30/2018  ? At moderate risk for fall 10/03/2018  ? Bilateral renal cysts 10/03/2018  ? Facet arthropathy, lumbosacral 10/03/2018  ? Microscopic hematuria 10/03/2018  ? Lacunar infarction ([place]) 07/20/2018  ? Craniofacial pain 07/04/2018  ? Proteinuria 03/21/2018  ? Hematuria 03/21/2018  ? Controlled type 2 diabetes mellitus without complication, without long-term current use of insulin ([place]) 03/08/2018  ? Encounter for weight management 01/06/2018  ? Urge incontinence 08/23/2017  ? 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/23/2017  ? Urinary frequency 08/17/2017  ? Overactive bladder 08/17/2017  ? Equinus contracture of ankle 07/27/2017  ? Hammer toes of both feet 07/27/2017  ? Dermatophytosis, nail  07/27/2017  ? Calcaneal spur of both feet 07/27/2017  ? Dysuria 07/07/2017  ? Atypical chest pain 06/10/2017  ? Aortic atherosclerosis ([place]) 06/10/2017  ? Cardiomegaly 06/10/2017  ? Vitreous detachment of right eye 05/12/2017  ?  Combined form of age-related cataract, both eyes 04/07/2017  ? Dermatochalasis of both eyelids 04/07/2017  ? Posterior vitreous detachment, right eye 04/07/2017  ? Acute left-sided low back pain without sciatica 03/09/2017  ? Postural kyphosis of cervicothoracic region 03/09/2017  ? Lumbar degenerative disc disease 03/09/2017  ? Primary osteoarthritis of left knee 03/04/2017  ? Chronic pain of left knee 03/04/2017  ? Bilateral primary osteoarthritis of knee 12/15/2016  ? Bilateral lower extremity edema 12/15/2016  ? Allergy to honey bee venom 12/15/2016  ? Essential hypertension 02/16/2016  ? DM type 2 with diabetic dyslipidemia ([place]) 02/10/2016  ? Gastroesophageal reflux disease without esophagitis 02/10/2016  ? Breast pain, left 12/30/2015  ? 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 Diabetic sensorimotor polyneuropathy ([place]) 10/27/2015  ? Papillary thyroid carcinoma ([place]) 08/09/2014  ? Post-menopausal atrophic vaginitis 06/24/2014  ? Left thyroid nodule 01/29/2014  ? Nodule of left lung 01/21/2014  ? Class 1 obesity due to excess calories with serious comorbidity and body mass index (BMI) of 30.0 to 30.9 in adult 06/12/2012  ? Palpitations 11/29/2011  ? Abnormal mammogram of right breast 08/30/2011  ? Arthritis of left knee 05/24/2011  ? Coronary artery disease involving native coronary artery of native heart with angina pectoris ([place]) 05/24/2011  ? History of acute myocardial infarction of inferior wall 05/24/2011  ? ? ?Camerin Ladouceur, PT ?11/11/2021, 10:14 AM ? ?[place] ?[facility] ?[address] ?[place], [place], [address] ?Phone: [phone]   Fax:  [phone] ? ?Name: Zuleica Seith ?MRN: [id number] ?Date of Birth:  01-06-1951 ? ? ? ?

## 2021-11-18 ENCOUNTER — Encounter: Payer: Medicare Other | Admitting: Physical Therapy

## 2021-11-25 ENCOUNTER — Ambulatory Visit: Payer: Medicare Other | Attending: Medical-Surgical | Admitting: Physical Therapy

## 2021-11-25 DIAGNOSIS — M6281 Muscle weakness (generalized): Secondary | ICD-10-CM | POA: Diagnosis present

## 2021-11-25 DIAGNOSIS — R293 Abnormal posture: Secondary | ICD-10-CM | POA: Insufficient documentation

## 2021-11-25 DIAGNOSIS — R201 Hypoesthesia of skin: Secondary | ICD-10-CM | POA: Diagnosis present

## 2021-11-25 NOTE — Therapy (Signed)
[place] ?[facility] ?[address] ?[place], [place], [address] ?Phone: [phone]   Fax:  [phone] ? ?Physical Therapy Treatment and Recertification ? ?Patient Details  ?Name: Sara Gardner ?MRN: [id number] ?Date of Birth: 07-27-1950 ?Referring Provider (PT): Samuel Bouche ? ? ?Encounter Date: 11/25/2021 ? ? PT End of Session - 11/25/21 0924   ? ? Visit Number 22   ? Number of Visits 25   ? Date for PT Re-Evaluation 01/06/22   ? Authorization - Visit Number 22   ? Progress Note Due on Visit 30   ? PT Start Time 0848   ? PT Stop Time 0623   ? PT Time Calculation (min) 45 min   ? Activity Tolerance Patient tolerated treatment well   ? Behavior During Therapy [facility] for tasks assessed/performed   ? ?  ?  ? ?  ? ? ?Past Medical History:  ?Diagnosis Date  ? Aortic atherosclerosis ([place]) 06/10/2017  ? CAD (coronary artery disease)   ? Cancer [facility])   ? Cardiomegaly 06/10/2017  ? Combined form of age-related cataract, both eyes 04/07/2017  ? Dermatochalasis of both eyelids 04/07/2017  ? Diabetes mellitus without complication ([place])   ? Diastolic heart failure, NYHA class 1 ([place])   ? GERD (gastroesophageal reflux disease)   ? Hypertension   ? Lacunar infarction ([place]) 07/20/2018  ? Left renal mass 10/04/2018  ? 1.7 cm hypoechoic mass 10/04/2018  ? Lumbar degenerative disc disease 03/09/2017  ? Myocardial infarction [facility]) 2005  ? Normocytic anemia 12/14/2018  ? Papillary thyroid carcinoma ([place])   ? Posterior vitreous detachment, right eye 04/07/2017  ? Thyroid disease   ? ? ?Past Surgical History:  ?Procedure Laterality Date  ? TOTAL THYROIDECTOMY  2016  ? ? ?There were no vitals filed for this visit. ? ? Subjective Assessment - 11/25/21 0855   ? ? Subjective Pt has started her new job and states "I have been hurting"   ? Patient Stated Goals reduce tingling/burning   ? Currently in Pain? Yes   ? Pain Score 6    ? Pain Location Neck   ? Pain Orientation Left   ? Pain Descriptors /  Indicators Tightness   ? Pain Type Chronic pain   ? ?  ?  ? ?  ? ? ? ? ? [facility] PT Assessment - 11/25/21 0001   ? ?  ? AROM  ? Cervical Flexion 45   ? Cervical Extension 25   ? Cervical - Right Side Bend 30   ? Cervical - Left Side Bend 32   ? Cervical - Right Rotation 50   ? Cervical - Left Rotation 48   ? ?  ?  ? ?  ? ? ? ? ? ? ? ? ? ? ? ? ? ? ? ? [facility] Adult PT Treatment/Exercise - 11/25/21 0001   ? ?  ? Neck Exercises: Machines for Strengthening  ? UBE (Upper Arm Bike) L4: 1 min each direction (standing)   ?  ? Neck Exercises: Seated  ? Other Seated Exercise horizontal abd x 20 red TB, bilat ER red TB x 20 red TB; diagonals x 10 ea; ext x 20; scaption 2 x 20 2#   ?  ? Moist Heat Therapy  ? Number Minutes Moist Heat 10 Minutes   ? Moist Heat Location Cervical   ?  ? Electrical Stimulation  ? Electrical Stimulation Location cervical   ? Electrical Stimulation Action TENS   ? Dealer  Stimulation Parameters to tolerance   ? Electrical Stimulation Goals Pain   ?  ? Manual Therapy  ? Joint Mobilization grade 2-3 PAs and lateral glides to Cervical spine   ? Soft tissue mobilization Lt cervical paraspinals, scalenes, suboccipitals, upper trap/levator   ? ?  ?  ? ?  ? ? ? ? ? ? ? ? ? ? ? ? ? ? ? PT Long Term Goals - 11/25/21 0927   ? ?  ? PT LONG TERM GOAL #1  ? Title Pt will be independent with HEP   ? Status On-going   ? Target Date 01/06/22   ?  ? PT LONG TERM GOAL #2  ? Title Pt will improve bilat UE strength to 4+/5 to tolerate yard work with decreased pain   ? Status Achieved   ?  ? PT LONG TERM GOAL #3  ? Title Pt will improve cervical sidebending and rotation ROM by 10 degrees to perform IADLs with decreased pain   ? Status Achieved   ?  ? PT LONG TERM GOAL #4  ? Title Pt will report decreased tightness in Lt cervical musculature 6/7 days each week   ? Status On-going   ? Target Date 01/06/22   ? ?  ?  ? ?  ? ? ? ? ? ? ? ? Plan - 11/25/21 0925   ? ? Clinical Impression Statement Pt continues to progress well  towards LTG's. She has increased tightness in Lt upper trap and levator, she has some decreased ROM today due to starting a new job. She will continue to benefit from skilled PT to work towards remaining goals   ? PT Frequency 1x / week   ? PT Duration 6 weeks   ? PT Treatment/Interventions Taping;Dry needling;Manual techniques;Passive range of motion;Patient/family education;Therapeutic exercise;Therapeutic activities;Neuromuscular re-education;Traction;Moist Heat;Electrical Stimulation;Cryotherapy   ? PT Next Visit Plan update HEP, postural strength   ? PT Home Exercise Plan [email or web address]   ? Consulted and Agree with Plan of Care Patient   ? ?  ?  ? ?  ? ? ?Patient will benefit from skilled therapeutic intervention in order to improve the following deficits and impairments:    ? ?Visit Diagnosis: ?Abnormal posture - Plan: PT plan of care cert/re-cert ? ?Muscle weakness (generalized) - Plan: PT plan of care cert/re-cert ? ?Impaired sensation - Plan: PT plan of care cert/re-cert ? ? ? ? ?Problem List ?Patient Active Problem List  ? Diagnosis Date Noted  ? Hypotension 05/26/2021  ? Neck pain 05/26/2021  ? Atherosclerosis 03/19/2020  ? CHF (congestive heart failure) ([place]) 11/06/2019  ? Other specified disorders of kidney and ureter 04/05/2019  ? Renal insufficiency 04/05/2019  ? 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/05/2019  ? Cervicalgia 04/05/2019  ? Eustachian tube dysfunction, bilateral 04/05/2019  ? 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 White matter abnormality on MRI of brain 04/05/2019  ? Elevated creatine kinase 12/14/2018  ? Myalgia due to statin 12/14/2018  ? Normocytic anemia 12/14/2018  ? Acute reaction to situational stress 11/30/2018  ? At moderate risk for fall 10/03/2018  ? Bilateral renal cysts 10/03/2018  ? Facet arthropathy, lumbosacral 10/03/2018  ? Microscopic hematuria 10/03/2018  ? Lacunar infarction ([place]) 07/20/2018  ? Craniofacial pain 07/04/2018  ? Proteinuria 03/21/2018  ? Hematuria 03/21/2018   ? Controlled type 2 diabetes mellitus without complication, without long-term current use of insulin ([place]) 03/08/2018  ? Encounter for weight management 01/06/2018  ? Urge incontinence 08/23/2017  ? Primary osteoarthritis involving  multiple joints 08/23/2017  ? Urinary frequency 08/17/2017  ? Overactive bladder 08/17/2017  ? Equinus contracture of ankle 07/27/2017  ? Hammer toes of both feet 07/27/2017  ? Dermatophytosis, nail 07/27/2017  ? Calcaneal spur of both feet 07/27/2017  ? Dysuria 07/07/2017  ? Atypical chest pain 06/10/2017  ? Aortic atherosclerosis ([place]) 06/10/2017  ? Cardiomegaly 06/10/2017  ? Vitreous detachment of right eye 05/12/2017  ? Combined form of age-related cataract, both eyes 04/07/2017  ? Dermatochalasis of both eyelids 04/07/2017  ? Posterior vitreous detachment, right eye 04/07/2017  ? Acute left-sided low back pain without sciatica 03/09/2017  ? Postural kyphosis of cervicothoracic region 03/09/2017  ? Lumbar degenerative disc disease 03/09/2017  ? Primary osteoarthritis of left knee 03/04/2017  ? Chronic pain of left knee 03/04/2017  ? Bilateral primary osteoarthritis of knee 12/15/2016  ? Bilateral lower extremity edema 12/15/2016  ? Allergy to honey bee venom 12/15/2016  ? Essential hypertension 02/16/2016  ? DM type 2 with diabetic dyslipidemia ([place]) 02/10/2016  ? Gastroesophageal reflux disease without esophagitis 02/10/2016  ? Breast pain, left 12/30/2015  ? 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 Diabetic sensorimotor polyneuropathy ([place]) 10/27/2015  ? Papillary thyroid carcinoma ([place]) 08/09/2014  ? Post-menopausal atrophic vaginitis 06/24/2014  ? Left thyroid nodule 01/29/2014  ? Nodule of left lung 01/21/2014  ? Class 1 obesity due to excess calories with serious comorbidity and body mass index (BMI) of 30.0 to 30.9 in adult 06/12/2012  ? Palpitations 11/29/2011  ? Abnormal mammogram of right breast 08/30/2011  ? Arthritis of left knee 05/24/2011  ? Coronary artery disease  involving native coronary artery of native heart with angina pectoris ([place]) 05/24/2011  ? History of acute myocardial infarction of inferior wall 05/24/2011  ? ? ?Kaien Pezzullo, PT ?11/25/2021, 9:29 AM ? ?C

## 2021-12-01 ENCOUNTER — Other Ambulatory Visit: Payer: Self-pay | Admitting: Cardiology

## 2021-12-02 ENCOUNTER — Ambulatory Visit: Payer: Medicare Other | Admitting: Physical Therapy

## 2021-12-02 DIAGNOSIS — R293 Abnormal posture: Secondary | ICD-10-CM

## 2021-12-02 DIAGNOSIS — M6281 Muscle weakness (generalized): Secondary | ICD-10-CM

## 2021-12-02 DIAGNOSIS — R201 Hypoesthesia of skin: Secondary | ICD-10-CM

## 2021-12-02 NOTE — Therapy (Signed)
[place] ?[facility] ?[address] ?[place], [place], [address] ?Phone: [phone]   Fax:  [phone] ? ?Physical Therapy Treatment ? ?Patient Details  ?Name: Sara Gardner ?MRN: [id number] ?Date of Birth: Nov 24, 1950 ?Referring Provider (PT): Samuel Bouche ? ? ?Encounter Date: 12/02/2021 ? ? PT End of Session - 12/02/21 7564   ? ? Visit Number 23   ? Number of Visits 25   ? Date for PT Re-Evaluation 01/06/22   ? Authorization - Visit Number 23   ? Progress Note Due on Visit 30   ? PT Start Time 0845   ? PT Stop Time [phone]   ? PT Time Calculation (min) 43 min   ? Activity Tolerance Patient tolerated treatment well   ? Behavior During Therapy [facility] for tasks assessed/performed   ? ?  ?  ? ?  ? ? ?Past Medical History:  ?Diagnosis Date  ? Aortic atherosclerosis ([place]) 06/10/2017  ? CAD (coronary artery disease)   ? Cancer [facility])   ? Cardiomegaly 06/10/2017  ? Combined form of age-related cataract, both eyes 04/07/2017  ? Dermatochalasis of both eyelids 04/07/2017  ? Diabetes mellitus without complication ([place])   ? Diastolic heart failure, NYHA class 1 ([place])   ? GERD (gastroesophageal reflux disease)   ? Hypertension   ? Lacunar infarction ([place]) 07/20/2018  ? Left renal mass 10/04/2018  ? 1.7 cm hypoechoic mass 10/04/2018  ? Lumbar degenerative disc disease 03/09/2017  ? Myocardial infarction [facility]) 2005  ? Normocytic anemia 12/14/2018  ? Papillary thyroid carcinoma ([place])   ? Posterior vitreous detachment, right eye 04/07/2017  ? Thyroid disease   ? ? ?Past Surgical History:  ?Procedure Laterality Date  ? TOTAL THYROIDECTOMY  2016  ? ? ?There were no vitals filed for this visit. ? ? Subjective Assessment - 12/02/21 0853   ? ? Subjective Pt states she is feeling "ok". Her job is causing her stress   ? Patient Stated Goals reduce tingling/burning   ? Currently in Pain? Yes   ? Pain Score 4    ? Pain Location Neck   ? ?  ?  ? ?  ? ? ? ? ? [facility] PT Assessment - 12/02/21 0001   ? ?  ?  AROM  ? Cervical - Right Rotation 50   ? Cervical - Left Rotation 50   ? ?  ?  ? ?  ? ? ? ? ? ? ? ? ? ? ? ? ? ? ? ? [place] Adult PT Treatment/Exercise - 12/02/21 0001   ? ?  ? Neck Exercises: Machines for Strengthening  ? UBE (Upper Arm Bike) L4: 1 min each direction (standing)   ?  ? Neck Exercises: Seated  ? Other Seated Exercise diagonals red TB x 20 each, horizontal abd 2 x 10 red TB, bilat ER red TB x 20, scaption x 20 2#   ?  ? Moist Heat Therapy  ? Number Minutes Moist Heat 10 Minutes   ? Moist Heat Location Cervical   ?  ? Electrical Stimulation  ? Electrical Stimulation Location cervical   ? Electrical Stimulation Action TENS   ? Electrical Stimulation Parameters to tolerance   ? Electrical Stimulation Goals Pain   ?  ? Manual Therapy  ? Joint Mobilization grade 2-3 PAs and lateral glides to Cervical spine   ? Soft tissue mobilization Lt cervical paraspinals, scalenes, suboccipitals, upper trap/levator   ? Passive ROM cervical all directions   ? ?  ?  ? ?  ? ? ? ? ? ? ? ? ? ? ? ? ? ? ?  PT Long Term Goals - 11/25/21 0927   ? ?  ? PT LONG TERM GOAL #1  ? Title Pt will be independent with HEP   ? Status On-going   ? Target Date 01/06/22   ?  ? PT LONG TERM GOAL #2  ? Title Pt will improve bilat UE strength to 4+/5 to tolerate yard work with decreased pain   ? Status Achieved   ?  ? PT LONG TERM GOAL #3  ? Title Pt will improve cervical sidebending and rotation ROM by 10 degrees to perform IADLs with decreased pain   ? Status Achieved   ?  ? PT LONG TERM GOAL #4  ? Title Pt will report decreased tightness in Lt cervical musculature 6/7 days each week   ? Status On-going   ? Target Date 01/06/22   ? ?  ?  ? ?  ? ? ? ? ? ? ? ? Plan - 12/02/21 0919   ? ? Clinical Impression Statement Pt continues to progress towards goals. She is improving ROM. She continues with increased tightness in LT upper trap and levator as well as cervical paraspinals   ? PT Next Visit Plan update HEP, postural strength   ? PT Home Exercise  Plan [email or web address]   ? Consulted and Agree with Plan of Care Patient   ? ?  ?  ? ?  ? ? ?Patient will benefit from skilled therapeutic intervention in order to improve the following deficits and impairments:    ? ?Visit Diagnosis: ?Abnormal posture ? ?Muscle weakness (generalized) ? ?Impaired sensation ? ? ? ? ?Problem List ?Patient Active Problem List  ? Diagnosis Date Noted  ? Hypotension 05/26/2021  ? Neck pain 05/26/2021  ? Atherosclerosis 03/19/2020  ? CHF (congestive heart failure) ([place]) 11/06/2019  ? Other specified disorders of kidney and ureter 04/05/2019  ? Renal insufficiency 04/05/2019  ? 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/05/2019  ? Cervicalgia 04/05/2019  ? Eustachian tube dysfunction, bilateral 04/05/2019  ? 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 White matter abnormality on MRI of brain 04/05/2019  ? Elevated creatine kinase 12/14/2018  ? Myalgia due to statin 12/14/2018  ? Normocytic anemia 12/14/2018  ? Acute reaction to situational stress 11/30/2018  ? At moderate risk for fall 10/03/2018  ? Bilateral renal cysts 10/03/2018  ? Facet arthropathy, lumbosacral 10/03/2018  ? Microscopic hematuria 10/03/2018  ? Lacunar infarction ([place]) 07/20/2018  ? Craniofacial pain 07/04/2018  ? Proteinuria 03/21/2018  ? Hematuria 03/21/2018  ? Controlled type 2 diabetes mellitus without complication, without long-term current use of insulin ([place]) 03/08/2018  ? Encounter for weight management 01/06/2018  ? Urge incontinence 08/23/2017  ? 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/23/2017  ? Urinary frequency 08/17/2017  ? Overactive bladder 08/17/2017  ? Equinus contracture of ankle 07/27/2017  ? Hammer toes of both feet 07/27/2017  ? Dermatophytosis, nail 07/27/2017  ? Calcaneal spur of both feet 07/27/2017  ? Dysuria 07/07/2017  ? Atypical chest pain 06/10/2017  ? Aortic atherosclerosis ([place]) 06/10/2017  ? Cardiomegaly 06/10/2017  ? Vitreous detachment of right eye 05/12/2017  ? Combined form of age-related  cataract, both eyes 04/07/2017  ? Dermatochalasis of both eyelids 04/07/2017  ? Posterior vitreous detachment, right eye 04/07/2017  ? Acute left-sided low back pain without sciatica 03/09/2017  ? Postural kyphosis of cervicothoracic region 03/09/2017  ? Lumbar degenerative disc disease 03/09/2017  ? Primary osteoarthritis of left knee 03/04/2017  ? Chronic pain of left knee 03/04/2017  ? Bilateral  primary osteoarthritis of knee 12/15/2016  ? Bilateral lower extremity edema 12/15/2016  ? Allergy to honey bee venom 12/15/2016  ? Essential hypertension 02/16/2016  ? DM type 2 with diabetic dyslipidemia ([place]) 02/10/2016  ? Gastroesophageal reflux disease without esophagitis 02/10/2016  ? Breast pain, left 12/30/2015  ? 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 Diabetic sensorimotor polyneuropathy ([place]) 10/27/2015  ? Papillary thyroid carcinoma ([place]) 08/09/2014  ? Post-menopausal atrophic vaginitis 06/24/2014  ? Left thyroid nodule 01/29/2014  ? Nodule of left lung 01/21/2014  ? Class 1 obesity due to excess calories with serious comorbidity and body mass index (BMI) of 30.0 to 30.9 in adult 06/12/2012  ? Palpitations 11/29/2011  ? Abnormal mammogram of right breast 08/30/2011  ? Arthritis of left knee 05/24/2011  ? Coronary artery disease involving native coronary artery of native heart with angina pectoris ([place]) 05/24/2011  ? History of acute myocardial infarction of inferior wall 05/24/2011  ? ? ?Ameenah Prosser, PT ?12/02/2021, 9:20 AM ? ?[place] ?[facility] ?[address] ?[place], [place], [address] ?Phone: [phone]   Fax:  [phone] ? ?Name: Chelsae Zanella ?MRN: [id number] ?Date of Birth: August 05, 1950 ? ? ? ?

## 2021-12-09 ENCOUNTER — Ambulatory Visit: Payer: Medicare Other | Admitting: Physical Therapy

## 2021-12-09 DIAGNOSIS — R201 Hypoesthesia of skin: Secondary | ICD-10-CM

## 2021-12-09 DIAGNOSIS — M6281 Muscle weakness (generalized): Secondary | ICD-10-CM

## 2021-12-09 DIAGNOSIS — R293 Abnormal posture: Secondary | ICD-10-CM

## 2021-12-09 NOTE — Therapy (Signed)
[place] ?[facility] ?[address] ?[place], [place], [address] ?Phone: [phone]   Fax:  [phone] ? ?Physical Therapy Treatment ? ?Patient Details  ?Name: Sara Gardner ?MRN: [id number] ?Date of Birth: 05-11-1951 ?Referring Provider (PT): Samuel Bouche ? ? ?Encounter Date: 12/09/2021 ? ? PT End of Session - 12/09/21 0917   ? ? Visit Number 24   ? Number of Visits 25   ? Date for PT Re-Evaluation 01/06/22   ? Authorization - Visit Number 24   ? Progress Note Due on Visit 30   ? PT Start Time [phone]   ? PT Stop Time 0930   ? PT Time Calculation (min) 40 min   ? Activity Tolerance Patient tolerated treatment well   ? Behavior During Therapy [facility] for tasks assessed/performed   ? ?  ?  ? ?  ? ? ?Past Medical History:  ?Diagnosis Date  ? Aortic atherosclerosis ([place]) 06/10/2017  ? CAD (coronary artery disease)   ? Cancer [facility])   ? Cardiomegaly 06/10/2017  ? Combined form of age-related cataract, both eyes 04/07/2017  ? Dermatochalasis of both eyelids 04/07/2017  ? Diabetes mellitus without complication ([place])   ? Diastolic heart failure, NYHA class 1 ([place])   ? GERD (gastroesophageal reflux disease)   ? Hypertension   ? Lacunar infarction ([place]) 07/20/2018  ? Left renal mass 10/04/2018  ? 1.7 cm hypoechoic mass 10/04/2018  ? Lumbar degenerative disc disease 03/09/2017  ? Myocardial infarction [facility]) 2005  ? Normocytic anemia 12/14/2018  ? Papillary thyroid carcinoma ([place])   ? Posterior vitreous detachment, right eye 04/07/2017  ? Thyroid disease   ? ? ?Past Surgical History:  ?Procedure Laterality Date  ? TOTAL THYROIDECTOMY  2016  ? ? ?There were no vitals filed for this visit. ? ? Subjective Assessment - 12/09/21 0852   ? ? Subjective Pt states she feels "a little tight". She is leaving her job so she will have less stress   ? Patient Stated Goals reduce tingling/burning   ? Currently in Pain? Yes   ? Pain Score 3    ? Pain Location Neck   ? Pain Orientation Left   ? Pain  Descriptors / Indicators Tightness   ? ?  ?  ? ?  ? ? ? ? ? [facility] PT Assessment - 12/09/21 0001   ? ?  ? AROM  ? Cervical - Right Side Bend 30   ? Cervical - Left Side Bend 35   ? ?  ?  ? ?  ? ? ? ? ? ? ? ? ? ? ? ? ? ? ? ? [place] Adult PT Treatment/Exercise - 12/09/21 0001   ? ?  ? Neck Exercises: Machines for Strengthening  ? UBE (Upper Arm Bike) L4: 1 min each direction (standing)   ?  ? Neck Exercises: Seated  ? Other Seated Exercise diagonals red TB x 20 each, horizontal abd 2 x 10 red TB, bilat ER red TB x 20, scaption x 20 2#   ?  ? Moist Heat Therapy  ? Number Minutes Moist Heat 10 Minutes   ? Moist Heat Location Cervical   ?  ? Electrical Stimulation  ? Electrical Stimulation Location cervical   ? Electrical Stimulation Action TENS   ? Electrical Stimulation Parameters to tolerance   ? Electrical Stimulation Goals Pain   ?  ? Manual Therapy  ? Joint Mobilization grade 2-3 PAs and lateral glides to Cervical spine   ?  Soft tissue mobilization Lt cervical paraspinals, scalenes, suboccipitals, upper trap/levator   ? Passive ROM cervical all directions   ? ?  ?  ? ?  ? ? ? ? ? ? ? ? ? ? ? ? ? ? ? PT Long Term Goals - 11/25/21 0927   ? ?  ? PT LONG TERM GOAL #1  ? Title Pt will be independent with HEP   ? Status On-going   ? Target Date 01/06/22   ?  ? PT LONG TERM GOAL #2  ? Title Pt will improve bilat UE strength to 4+/5 to tolerate yard work with decreased pain   ? Status Achieved   ?  ? PT LONG TERM GOAL #3  ? Title Pt will improve cervical sidebending and rotation ROM by 10 degrees to perform IADLs with decreased pain   ? Status Achieved   ?  ? PT LONG TERM GOAL #4  ? Title Pt will report decreased tightness in Lt cervical musculature 6/7 days each week   ? Status On-going   ? Target Date 01/06/22   ? ?  ?  ? ?  ? ? ? ? ? ? ? ? Plan - 12/09/21 0918   ? ? Clinical Impression Statement Pt is making progress towards goals. Improving cervical sidebending ROM. Still with upper trap tightness on Lt limiting mobility.  Good response to manual therapy   ? PT Next Visit Plan check goals, d/c   ? PT Home Exercise Plan [email or web address]   ? Consulted and Agree with Plan of Care Patient   ? ?  ?  ? ?  ? ? ?Patient will benefit from skilled therapeutic intervention in order to improve the following deficits and impairments:    ? ?Visit Diagnosis: ?Abnormal posture ? ?Muscle weakness (generalized) ? ?Impaired sensation ? ? ? ? ?Problem List ?Patient Active Problem List  ? Diagnosis Date Noted  ? Hypotension 05/26/2021  ? Neck pain 05/26/2021  ? Atherosclerosis 03/19/2020  ? CHF (congestive heart failure) ([place]) 11/06/2019  ? Other specified disorders of kidney and ureter 04/05/2019  ? Renal insufficiency 04/05/2019  ? 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/05/2019  ? Cervicalgia 04/05/2019  ? Eustachian tube dysfunction, bilateral 04/05/2019  ? 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 White matter abnormality on MRI of brain 04/05/2019  ? Elevated creatine kinase 12/14/2018  ? Myalgia due to statin 12/14/2018  ? Normocytic anemia 12/14/2018  ? Acute reaction to situational stress 11/30/2018  ? At moderate risk for fall 10/03/2018  ? Bilateral renal cysts 10/03/2018  ? Facet arthropathy, lumbosacral 10/03/2018  ? Microscopic hematuria 10/03/2018  ? Lacunar infarction ([place]) 07/20/2018  ? Craniofacial pain 07/04/2018  ? Proteinuria 03/21/2018  ? Hematuria 03/21/2018  ? Controlled type 2 diabetes mellitus without complication, without long-term current use of insulin ([place]) 03/08/2018  ? Encounter for weight management 01/06/2018  ? Urge incontinence 08/23/2017  ? 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/23/2017  ? Urinary frequency 08/17/2017  ? Overactive bladder 08/17/2017  ? Equinus contracture of ankle 07/27/2017  ? Hammer toes of both feet 07/27/2017  ? Dermatophytosis, nail 07/27/2017  ? Calcaneal spur of both feet 07/27/2017  ? Dysuria 07/07/2017  ? Atypical chest pain 06/10/2017  ? Aortic atherosclerosis ([place]) 06/10/2017  ?  Cardiomegaly 06/10/2017  ? Vitreous detachment of right eye 05/12/2017  ? Combined form of age-related cataract, both eyes 04/07/2017  ? Dermatochalasis of both eyelids 04/07/2017  ? Posterior vitreous detachment, right eye 04/07/2017  ? Acute left-sided  low back pain without sciatica 03/09/2017  ? Postural kyphosis of cervicothoracic region 03/09/2017  ? Lumbar degenerative disc disease 03/09/2017  ? Primary osteoarthritis of left knee 03/04/2017  ? Chronic pain of left knee 03/04/2017  ? Bilateral primary osteoarthritis of knee 12/15/2016  ? Bilateral lower extremity edema 12/15/2016  ? Allergy to honey bee venom 12/15/2016  ? Essential hypertension 02/16/2016  ? DM type 2 with diabetic dyslipidemia ([place]) 02/10/2016  ? Gastroesophageal reflux disease without esophagitis 02/10/2016  ? Breast pain, left 12/30/2015  ? 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 Diabetic sensorimotor polyneuropathy ([place]) 10/27/2015  ? Papillary thyroid carcinoma ([place]) 08/09/2014  ? Post-menopausal atrophic vaginitis 06/24/2014  ? Left thyroid nodule 01/29/2014  ? Nodule of left lung 01/21/2014  ? Class 1 obesity due to excess calories with serious comorbidity and body mass index (BMI) of 30.0 to 30.9 in adult 06/12/2012  ? Palpitations 11/29/2011  ? Abnormal mammogram of right breast 08/30/2011  ? Arthritis of left knee 05/24/2011  ? Coronary artery disease involving native coronary artery of native heart with angina pectoris ([place]) 05/24/2011  ? History of acute myocardial infarction of inferior wall 05/24/2011  ? ? ?Adelae Yodice, PT ?12/09/2021, 9:19 AM ? ?[place] ?[facility] ?[address] ?[place], [place], [address] ?Phone: [phone]   Fax:  [phone] ? ?Name: Geraldyn Shain ?MRN: [id number] ?Date of Birth: [birth year] ? ? ? ?

## 2021-12-16 ENCOUNTER — Ambulatory Visit: Payer: Medicare Other | Admitting: Physical Therapy

## 2021-12-16 DIAGNOSIS — R293 Abnormal posture: Secondary | ICD-10-CM

## 2021-12-16 DIAGNOSIS — M6281 Muscle weakness (generalized): Secondary | ICD-10-CM

## 2021-12-16 DIAGNOSIS — R201 Hypoesthesia of skin: Secondary | ICD-10-CM

## 2021-12-16 NOTE — Therapy (Signed)
[facility] [address] [place], [place], [address] Phone: [phone]   Fax:  [phone]  Physical Therapy  and Discharge  Patient Details  Name: Sara Gardner MRN: [id number] Date of Birth: 31-Mar-1951 Referring Provider (PT): Samuel Bouche   Encounter Date: 12/16/2021 Rationale for Evaluation and Treatment Rehabilitation   PT End of Session - 12/16/21 0827     Visit Number 25    Number of Visits 25    Authorization - Visit Number 25    Progress Note Due on Visit 30    PT Start Time 0808    PT Stop Time 0840    PT Time Calculation (min) 32 min    Activity Tolerance Patient tolerated treatment well    Behavior During Therapy [facility] for tasks assessed/performed             Past Medical History:  Diagnosis Date   Aortic atherosclerosis ([place]) 06/10/2017   CAD (coronary artery disease)    Cancer ([place])    Cardiomegaly 06/10/2017   Combined form of age-related cataract, both eyes 04/07/2017   Dermatochalasis of both eyelids 04/07/2017   Diabetes mellitus without complication (HCC)    Diastolic heart failure, NYHA class 1 ([place])    GERD (gastroesophageal reflux disease)    Hypertension    Lacunar infarction ([place]) 07/20/2018   Left renal mass 10/04/2018   1.7 cm hypoechoic mass 10/04/2018   Lumbar degenerative disc disease 03/09/2017   Myocardial infarction ([place]) 2005   Normocytic anemia 12/14/2018   Papillary thyroid carcinoma ([place])    Posterior vitreous detachment, right eye 04/07/2017   Thyroid disease     Past Surgical History:  Procedure Laterality Date   TOTAL THYROIDECTOMY  2016    There were no vitals filed for this visit.   Subjective Assessment - 12/16/21 0809     Subjective Pt states she finished her job. Her neck is feeling better, just "a little tight"    Patient Stated Goals reduce tingling/burning    Currently in Pain? Yes    Pain Score 3     Pain Location Neck    Pain Descriptors /  Indicators Tightness                               [facility] Adult PT Treatment/Exercise - 12/16/21 0001       Moist Heat Therapy   Number Minutes Moist Heat 10 Minutes    Moist Heat Location Cervical      Electrical Stimulation   Electrical Stimulation Location cervical    Electrical Stimulation Action TENS    Electrical Stimulation Parameters to tolerance    Electrical Stimulation Goals Pain      Manual Therapy   Joint Mobilization grade 2-3 PAs and lateral glides to Cervical spine    Soft tissue mobilization Lt cervical paraspinals, scalenes, suboccipitals, upper trap/levator    Passive ROM cervical all directions                          PT Long Term Goals - 12/16/21 0810       PT LONG TERM GOAL #1   Title Pt will be independent with HEP    Status Achieved      PT LONG TERM GOAL #4   Title Pt will report decreased tightness in Lt cervical musculature 6/7 days each week    Status Achieved  Plan - 12/16/21 1601     Clinical Impression Statement Pt has achieved all goals. She has decreased pain and improved postural strength. Pt is ready for d/c to HEP    PT Next Visit Plan d/c    PT Home Exercise Plan [id number]    Consulted and Agree with Plan of Care Patient             Patient will benefit from skilled therapeutic intervention in order to improve the following deficits and impairments:     Visit Diagnosis: Abnormal posture  Muscle weakness (generalized)  Impaired sensation     Problem List Patient Active Problem List   Diagnosis Date Noted   Hypotension 05/26/2021   Neck pain 05/26/2021   Atherosclerosis 03/19/2020   CHF (congestive heart failure) ([place]) 11/06/2019   Other specified disorders of kidney and ureter 04/05/2019   Renal insufficiency 04/05/2019   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/05/2019   Cervicalgia 04/05/2019   Eustachian tube dysfunction, bilateral 04/05/2019    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[place]) 04/05/2019   White matter abnormality on MRI of brain 04/05/2019   Elevated creatine kinase 12/14/2018   Myalgia due to statin 12/14/2018   Normocytic anemia 12/14/2018   Acute reaction to situational stress 11/30/2018   At moderate risk for fall 10/03/2018   Bilateral renal cysts 10/03/2018   Facet arthropathy, lumbosacral 10/03/2018   Microscopic hematuria 10/03/2018   Lacunar infarction ([place]) 07/20/2018   Craniofacial pain 07/04/2018   Proteinuria 03/21/2018   Hematuria 03/21/2018   Controlled type 2 diabetes mellitus without complication, without long-term current use of insulin ([place]) 03/08/2018   Encounter for weight management 01/06/2018   Urge incontinence 08/23/2017   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/23/2017   Urinary frequency 08/17/2017   Overactive bladder 08/17/2017   Equinus contracture of ankle 07/27/2017   Hammer toes of both feet 07/27/2017   Dermatophytosis, nail 07/27/2017   Calcaneal spur of both feet 07/27/2017   Dysuria 07/07/2017   Atypical chest pain 06/10/2017   Aortic atherosclerosis ([place]) 06/10/2017   Cardiomegaly 06/10/2017   Vitreous detachment of right eye 05/12/2017   Combined form of age-related cataract, both eyes 04/07/2017   Dermatochalasis of both eyelids 04/07/2017   Posterior vitreous detachment, right eye 04/07/2017   Acute left-sided low back pain without sciatica 03/09/2017   Postural kyphosis of cervicothoracic region 03/09/2017   Lumbar degenerative disc disease 03/09/2017   Primary osteoarthritis of left knee 03/04/2017   Chronic pain of left knee 03/04/2017   Bilateral primary osteoarthritis of knee 12/15/2016   Bilateral lower extremity edema 12/15/2016   Allergy to honey bee venom 12/15/2016   Essential hypertension 02/16/2016   DM type 2 with diabetic dyslipidemia ([place]) 02/10/2016   Gastroesophageal reflux disease without esophagitis 02/10/2016   Breast pain, left 12/30/2015    Hypothyroidism, 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   Diabetic sensorimotor polyneuropathy ([place]) 10/27/2015   Papillary thyroid carcinoma ([place]) 08/09/2014   Post-menopausal atrophic vaginitis 06/24/2014   Left thyroid nodule 01/29/2014   Nodule of left lung 01/21/2014   Class 1 obesity due to excess calories with serious comorbidity and body mass index (BMI) of 30.0 to 30.9 in adult 06/12/2012   Palpitations 11/29/2011   Abnormal mammogram of right breast 08/30/2011   Arthritis of left knee 05/24/2011   Coronary artery disease involving native coronary artery of native heart with angina pectoris ([place]) 05/24/2011   History of acute myocardial infarction of inferior wall 05/24/2011   PHYSICAL THERAPY DISCHARGE SUMMARY  Visits from Start of Care:  25  Current functional level related to goals / functional outcomes: Improved strength, decreased pain   Remaining deficits: Muscle tightness   Education / Equipment: HEP   Patient agrees to discharge. Patient goals were met. Patient is being discharged due to being pleased with the current functional level.  [name] Haegele, PT 12/16/2021, 8:29 AM  [facility] [address] [place], [place], [address] Phone: [phone]   Fax:  [phone]  Name: Taura Lamarre MRN: [id number] Date of Birth: 12-06-1950

## 2021-12-25 ENCOUNTER — Telehealth: Payer: Self-pay | Admitting: Cardiology

## 2021-12-25 NOTE — Telephone Encounter (Signed)
Dr Martinique  ( doctor of Day) spoke to caller.    Per Dr Martinique he I suggested to discontinue HCTZ 12.5 mg and continue with Lisinopril  20 mg

## 2021-12-25 NOTE — Telephone Encounter (Signed)
Patient is in the ER and they want to make changes to the medication but need approval first.

## 2021-12-28 ENCOUNTER — Telehealth: Payer: Self-pay | Admitting: General Practice

## 2021-12-28 NOTE — Telephone Encounter (Signed)
Transition Care Management Follow-up Telephone Call Date of discharge and from where: 12/25/21 from [facility] How have you been since you were released from the hospital? Doing a little better. Waiting to hear back from the cardiologist. Any questions or concerns? No  Items Reviewed: Did the pt receive and understand the discharge instructions provided? Yes  Medications obtained and verified? No  Other? No  Any new allergies since your discharge? No  Dietary orders reviewed? No Do you have support at home? Yes   Home Care and Equipment/Supplies: Were home health services ordered? no  Functional Questionnaire: (I = Independent and D = Dependent) ADLs: I  Bathing/Dressing- I  Meal Prep- I  Eating- I  Maintaining continence- I  Transferring/Ambulation- I  Managing Meds- I  Follow up appointments reviewed:  PCP Hospital f/u appt confirmed? Yes  Scheduled to see Samuel Bouche, NP on 01/08/22 @ 1600. [place] [facility] f/u appt confirmed? No   Are transportation arrangements needed? No  If their condition worsens, is the pt aware to call PCP or go to the Emergency Dept.? Yes Was the patient provided with contact information for the PCP's office or ED? Yes Was to pt encouraged to call back with questions or concerns? Yes

## 2021-12-29 ENCOUNTER — Telehealth: Payer: Self-pay | Admitting: Cardiology

## 2021-12-29 NOTE — Telephone Encounter (Addendum)
Returned call to patient who states that she needs a follow up with Dr. Stanford Breed since she was just in the hospital. Patient would like to see Dr. Stanford Breed in [place] but states she will see Dr. Stanford Breed anywhere if needed. Patient would only like to see Dr. Stanford Breed but advised patient I did not see sooner appointment. Advised patient I would forward message to Dr. Jacalyn Lefevre nurse for review. Patient verbalized understanding.

## 2021-12-29 NOTE — Telephone Encounter (Signed)
Patient called and needed to make appt to see Dr. Stanford Breed. Just got out of the ER and only wants to Dr. Stanford Breed. Let patient know that the earliest appt is in August in [place]. Patient is upset due to patient thinking we have "special places." Patient wants to talk with a nurse

## 2021-12-29 NOTE — Telephone Encounter (Signed)
Left message for pt to call.

## 2022-01-05 ENCOUNTER — Ambulatory Visit (INDEPENDENT_AMBULATORY_CARE_PROVIDER_SITE_OTHER): Payer: Medicare Other | Admitting: Medical-Surgical

## 2022-01-05 ENCOUNTER — Encounter: Payer: Self-pay | Admitting: Medical-Surgical

## 2022-01-05 VITALS — BP 168/102 | HR 89 | Resp 20 | Ht 62.5 in | Wt 172.6 lb

## 2022-01-05 DIAGNOSIS — E119 Type 2 diabetes mellitus without complications: Secondary | ICD-10-CM | POA: Diagnosis not present

## 2022-01-05 DIAGNOSIS — N281 Cyst of kidney, acquired: Secondary | ICD-10-CM

## 2022-01-05 DIAGNOSIS — E890 Postprocedural hypothyroidism: Secondary | ICD-10-CM

## 2022-01-05 DIAGNOSIS — K219 Gastro-esophageal reflux disease without esophagitis: Secondary | ICD-10-CM

## 2022-01-05 DIAGNOSIS — Z09 Encounter for follow-up examination after completed treatment for conditions other than malignant neoplasm: Secondary | ICD-10-CM | POA: Diagnosis not present

## 2022-01-05 DIAGNOSIS — I10 Essential (primary) hypertension: Secondary | ICD-10-CM

## 2022-01-05 MED ORDER — LEVOTHYROXINE SODIUM 75 MCG PO TABS
75.0000 ug | ORAL_TABLET | Freq: Every day | ORAL | 1 refills | Status: DC
Start: 2022-01-05 — End: 2022-07-28

## 2022-01-05 MED ORDER — LISINOPRIL 10 MG PO TABS: 10.0000 mg | ORAL_TABLET | Freq: Every day | ORAL | 3 refills | Status: DC

## 2022-01-05 MED ORDER — AMMONIUM LACTATE 12 % EX LOTN: TOPICAL_LOTION | CUTANEOUS | 0 refills | Status: DC

## 2022-01-05 NOTE — Progress Notes (Signed)
Established Patient Office Visit  Subjective   Patient ID: Sara Gardner, female   DOB: 1950-11-14 Age: 71 y.o. MRN: [id number]   Chief Complaint  Patient presents with   Hospitalization Follow-up    HPI Pleasant 71 year old female presenting today for a hospital follow-up discharge.  She presented to the ED on 12/25/2021 for "acute onset of sharp midsternal pain. Patient rates pain as a 1/10 in intensity currently. Associated symptoms are dyspnea. Denies palpitations, syncope, diaphoresis, N/V. The chest pain is made worse with none. Alleviating factors are: spontaneous."  During her stay, they completed a comprehensive work-up with the following concerns: "The patient did have soft blood pressures that were fluid responsive."  And "During her dobutamine stress test she became hypotensive after atropine, but was again fluid responsive."  On discharge, she was instructed to discontinue lisinopril-hydrochlorothiazide and started on lisinopril 10 mg.  She was instructed to follow-up with cardiology for further evaluation.  She has been in contact with her cardiologist's office but is not currently scheduled for an appointment due to lack of scheduled availability with her specific provider.  She is resistant to see a different cardiologist at this time. Notes that she did not start the Lisinopril '10mg'$  daily since her pharmacy did not receive the prescription. Has not been taking any BP medication since her discharge.   DM- followed by Endocrinology.   Hypothyroidism- followed by Endocrinology.   GERD- taking Nexium '20mg'$  daily, tolerating well without side effects. Symptoms well controlled.  Renal cysts- known history for two small renal cysts, last imaged in April 2022. Is worried about them getting bigger as she has started having bilateral back pain in the lower part of the thoracic spine. Also recently had labs at her ED visit showing her renal function has worsened.    Objective:     Vitals:   01/05/22 1602  BP: (!) 168/102  Pulse: 89  Resp: 20  Height: 5' 2.5" (1.588 m)  Weight: 172 lb 9.6 oz (78.3 kg)  SpO2: 95%  BMI (Calculated): 31.05   Physical Exam Vitals and nursing note reviewed.  Constitutional:      General: She is not in acute distress.    Appearance: Normal appearance. She is obese. She is not ill-appearing.  HENT:     Head: Normocephalic and atraumatic.  Cardiovascular:     Rate and Rhythm: Normal rate and regular rhythm.     Pulses: Normal pulses.     Heart sounds: Normal heart sounds.  Pulmonary:     Effort: Pulmonary effort is normal. No respiratory distress.     Breath sounds: Normal breath sounds. No wheezing, rhonchi or rales.  Skin:    General: Skin is warm and dry.  Neurological:     Mental Status: She is alert and oriented to person, place, and time.  Psychiatric:        Mood and Affect: Mood normal.        Behavior: Behavior normal.        Thought Content: Thought content normal.        Judgment: Judgment normal.    No results found for this or any previous visit (from the past 24 hour(s)).     The ASCVD Risk score (Arnett DK, et al., 2019) failed to calculate for the following reasons:   The patient has a prior MI or stroke diagnosis   Assessment & Plan:   1. Hospital discharge follow-up Reviewed available records and discussed the course and discharge instructions  with her. She needs to get in with Cardiology as soon as possible. Discussed getting her in with someone besides Dr. Stanford Breed but she remains resistant.   2. Essential hypertension BP significantly elevated today. Checking labs. Restart Lisinopril '10mg'$  daily, new script sent to the pharmacy.  - Lipid panel - COMPLETE METABOLIC PANEL WITH GFR - CBC with Differential  3. Gastroesophageal reflux disease without esophagitis Continue Nexium '20mg'$  daily.   4. Controlled type 2 diabetes mellitus without complication, without long-term current use of insulin  ([place]) Managed by Endocrinology but no documented A1c on file in [facility] or in [place]. Checking today.   5. Hypothyroidism, postsurgical Managed by Endocrinology. She is almost out of levothyroxine and will be out of town for the next couple of weeks. Refilling today. Plan to return to Endocrinology for further refills/management.   6. Bilateral renal cysts Checking renal function. [place] ordered to re-evaluate given recent back pain concerns.  - COMPLETE METABOLIC PANEL WITH GFR - US RENAL; Future  Return in about 2 weeks (around 01/19/2022) for nurse visit for BP check.  ___________________________________________ Clearnce Sorrel, DNP, APRN, FNP-BC [facility] [place]

## 2022-01-06 LAB — CBC WITH DIFFERENTIAL/PLATELET
Absolute Monocytes: 484 cells/uL (ref 200–950)
Basophils Absolute: 37 cells/uL (ref 0–200)
Basophils Relative: 0.6 %
Eosinophils Absolute: 229 cells/uL (ref 15–500)
Eosinophils Relative: 3.7 %
HCT: 32.5 % — ABNORMAL LOW (ref 35.0–45.0)
Hemoglobin: 10.6 g/dL — ABNORMAL LOW (ref 11.7–15.5)
Lymphs Abs: 2585 cells/uL (ref 850–3900)
MCH: 27 pg (ref 27.0–33.0)
MCHC: 32.6 g/dL (ref 32.0–36.0)
MCV: 82.9 fL (ref 80.0–100.0)
MPV: 10.9 fL (ref 7.5–12.5)
Monocytes Relative: 7.8 %
Neutro Abs: 2864 cells/uL (ref 1500–7800)
Neutrophils Relative %: 46.2 %
Platelets: 238 10*3/uL (ref 140–400)
RBC: 3.92 10*6/uL (ref 3.80–5.10)
RDW: 15.4 % — ABNORMAL HIGH (ref 11.0–15.0)
Total Lymphocyte: 41.7 %
WBC: 6.2 10*3/uL (ref 3.8–10.8)

## 2022-01-06 LAB — LIPID PANEL
Cholesterol: 151 mg/dL (ref <200)
HDL: 71 mg/dL (ref >=50)
LDL Cholesterol (Calc): 60 mg/dL (calc)
Non-HDL Cholesterol (Calc): 80 mg/dL (calc) (ref <130)
Total CHOL/HDL Ratio: 2.1 (calc) (ref <5.0)
Triglycerides: 116 mg/dL (ref <150)

## 2022-01-06 LAB — HEMOGLOBIN A1C
Hgb A1c MFr Bld: 6.2 % of total Hgb — ABNORMAL HIGH (ref <5.7)
Mean Plasma Glucose: 131 mg/dL
eAG (mmol/L): 7.3 mmol/L

## 2022-01-06 LAB — COMPLETE METABOLIC PANEL WITH GFR
AG Ratio: 1.4 (calc) (ref 1.0–2.5)
ALT: 22 U/L (ref 6–29)
AST: 21 U/L (ref 10–35)
Albumin: 3.7 g/dL (ref 3.6–5.1)
Alkaline phosphatase (APISO): 51 U/L (ref 37–153)
BUN/Creatinine Ratio: 13 (calc) (ref 6–22)
BUN: 14 mg/dL (ref 7–25)
CO2: 29 mmol/L (ref 20–32)
Calcium: 9.2 mg/dL (ref 8.6–10.4)
Chloride: 107 mmol/L (ref 98–110)
Creat: 1.05 mg/dL — ABNORMAL HIGH (ref 0.60–1.00)
Globulin: 2.6 g/dL (calc) (ref 1.9–3.7)
Glucose, Bld: 96 mg/dL (ref 65–99)
Potassium: 3.6 mmol/L (ref 3.5–5.3)
Sodium: 143 mmol/L (ref 135–146)
Total Bilirubin: 0.3 mg/dL (ref 0.2–1.2)
Total Protein: 6.3 g/dL (ref 6.1–8.1)
eGFR: 57 mL/min/{1.73_m2} — ABNORMAL LOW (ref >=60)

## 2022-01-06 LAB — TSH: TSH: 4.9 mIU/L — ABNORMAL HIGH (ref 0.40–4.50)

## 2022-01-07 ENCOUNTER — Ambulatory Visit (INDEPENDENT_AMBULATORY_CARE_PROVIDER_SITE_OTHER): Payer: Medicare Other

## 2022-01-07 DIAGNOSIS — N281 Cyst of kidney, acquired: Secondary | ICD-10-CM | POA: Diagnosis not present

## 2022-01-11 NOTE — Telephone Encounter (Signed)
Spoke with pt, appointment scheduled for hospital follow up with PA-C. Next available appointment with dr Stanford Breed scheduled.

## 2022-01-11 NOTE — Progress Notes (Unsigned)
Cardiology Office Note:    Date:  01/11/2022   ID:  Sara Gardner, DOB 04/12/1951, MRN [id number]  PCP:  Sara Gardner  Cardiologist:  None  Electrophysiologist:  None   Referring MD: Sara Gardner   Chief Complaint: ED follow-up of chest pain  History of Present Illness:    Sara Gardner is a 71 y.o. 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  Patient has a history of CAD was previously followed by Sara Gardner at [facility]. She reportedly had a MI in 2005 and cardiac catheterization at that time which showed an occluded infarct vessel which was treated medically. However, we have no detailed records from this. She was referred to Sara Gardner in 03/2022 to establish care. Patient was seen in the ED in 06/2021 with atypical chest pain. Troponin was normal but D-dimer was elevated. Unfortunately patient left AMA before complete work-up could be done. Patient was last seen by Sara Gardner in 08/2021 at which time she reported palpitations and chest pain when she gets upset but not at other times. She also reported that she felt as though "her neighbor was using a electromagnetic wave against her." Sara Gardner was ordered for risk stratification and showed findings consistent with artifact from breast attenuation but no evidence of ischemia or prior infarct. 2 week Zio monitor was ordered for further evaluation of palpitations and showed underlying sinus rhythm with brief episodes of PAT and some PACs/PVCs but no significant arrhythmias.   Since last visit, she was recently seen in the ED at [facility] on 12/25/2021 for further  evaluation of acute onset of sharp mid-sternal chest pain that she ranked as a 1/10 on the pain scale with associated dyspnea but no palpitations, nausea, vomiting, or diaphoresis. EKG showed no acute ischemic changes. Troponin was negative. Dobutamine stress test was performed and showed no evidence of ischemia. However, LVOT and intracavitary gradient were not obtained at peak stress and were normal normal at rest. Visually, there was near chamber obliteration with mitral and chordal systolic anterior motion which correlates with significant hypotension after dobutamine/ atropine that resolved with saline infusion. Cardiology there felt this was due to extensive hypertensive heart disease rather than HOCM. She was noted to have transient low blood pressure in the ED. Home BP medications included Lisinopril-HCTZ 20-12.'5mg'$  daily, Imdur '30mg'$  daily, and Toprol-XL '50mg'$  daily. HCTZ was stopped and Lisinopril was decreased to '10mg'$  daily.  Patient presents today for follow-up. ***  Chest Pain History of CAD Patient reportedly had MI in 2005 and cardiac catheterization showed on n occluded infarct vessel which was treated medically. However, we have no detailed records from this. Lexiscan Myoview in 08/2021 showed no evidence of ischemia or prior infarction. She was recently seen in the [facility] ED on 12/25/2021 for chest pain. Dobutamine stress echo was performed and showed showed no evidence of ischemia. However, LVOT and intracavitary gradient were not obtained at peak stress and were normal normal at rest. Visually, there was near chamber obliteration with mitral and chordal systolic anterior motion which correlates with significant hypotension after dobutamine/ atropine that resolved with saline infusion. Cardiology there felt this was due to extensive  hypertensive heart disease rather than HOCM. - *** - Continue antianginal: Imdur '30mg'$  daily and Toprol-XL '50mg'$  daily. - Continue aspirin and high-intensity  statin.  Palpitations Monitor in 08/2021 showed brief episodes of PAT and some PACs/PVCs but no significant arrhythmias. - Continue Toprol-XL '50mg'$  daily.  Hypertension BP well controlled. *** - Continue current medications: Lisinopril '10mg'$  daily, Imdur '30mg'$  daily, and Toprol-XL '50mg'$  daily.   Hyperlipidemia Recent lipid panel on 01/05/2022: Total Cholesterol 151, Triglycerides 116, HDL 71, LDL 60.  - Continue Crestor '40mg'$  daily.   Type 2 Diabetes Mellitus Hemoglobin A1c 6.2.  - On Metformin and Trulicity. - Management per PCP.  Past Medical History:  Diagnosis Date   Aortic atherosclerosis ([place]) 06/10/2017   CAD (coronary artery disease)    Cancer ([place])    Cardiomegaly 06/10/2017   Combined form of age-related cataract, both eyes 04/07/2017   Dermatochalasis of both eyelids 04/07/2017   Diabetes mellitus without complication (HCC)    Diastolic heart failure, NYHA class 1 (HCC)    GERD (gastroesophageal reflux disease)    Hypertension    Lacunar infarction (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     Allergies:   Bee venom, Tuberculin tests, and Tape   Social History   Socioeconomic History   Marital status: Single    Spouse name: Not on file   Number of children: 1   Years of education: Not on file   Highest education level: Not on file  Occupational History   Not on file  Tobacco Use   Smoking status: Former   Smokeless tobacco: Never  Vaping Use   Vaping Use: Never used   Substance and Sexual Activity   Alcohol use: No   Drug use: No   Sexual activity: Not Currently  Other Topics Concern   Not on file  Social History Narrative   Not on file   Social Determinants of Health   Financial Resource Strain: Not on file  Food Insecurity: Not on file  Transportation Needs: Not on file  Physical Activity: Not on file  Stress: Not on file  Social Connections: Not on file     Family History: The patient's family history includes Breast cancer in her daughter; Hypertension in her brother, mother, sister, and sister.  ROS:   Please see the history of present illness.     EKGs/Labs/Other Studies Reviewed:    The following studies were reviewed today:  Lexiscan Myoview 09/21/2021:   There was < 91m of horizontal ST segment depression in the inferolateral leads during exercise.   LV perfusion is abnormal. There is no evidence of ischemia. There is no evidence of infarction. Defect 1: There is a small defect with mild reduction in uptake present in the mid inferolateral location(s) that is fixed. There is normal wall motion in the defect area. Consistent with artifact caused by breast attenuation.   The study is normal. Findings are consistent with no prior ischemia. The study is low risk.   Left ventricular function is  normal. Nuclear stress EF: 61 %. The left ventricular ejection fraction is normal (55-65%). End diastolic cavity size is normal. End systolic cavity size is normal.   Prior study not available for comparison. _______________  Chauncy Passy 09/22/2021 to 10/06/2021: Patient had a min HR of 55 bpm, max HR of 154 bpm, and avg HR of 85 bpm. Predominant underlying rhythm was Sinus Rhythm. 24 Supraventricular Tachycardia runs occurred, the run with the fastest interval lasting 5 beats with a max rate of 154 bpm, the  longest lasting 15 beats with an avg rate of 96 bpm. Isolated SVEs were rare (<1.0%), SVE Couplets were rare (<1.0%), and SVE Triplets were  rare (<1.0%). Isolated VEs were rare (<1.0%), VE Couplets were rare (<1.0%), and no VE Triplets were present.  Ventricular Bigeminy and Trigeminy were present.    Sinus bradycardia, normal sinus rhythm, sinus tachycardia, occasional PAC, brief episodes of PAT and rare PVC. _______________  Dobutamine Stress Echocardiogram 12/25/2021: Summary: The patient had no chest pain during stress  Normal left ventricular function at rest.  There is mild concentric left ventricular hypertrophy.   The estimated LV ejection fraction is 60-65% . There was normal left  ventricular wall motion at rest. Normal left ventricular function and  global wall motion with stress. There were no segmental wall motion  abnormalities with dobutamine.   Negative dobutamine echocardiography for inducible ischemia at target  heart rate.   LVOT and intracavitary gradients were not obtained at peak stress and  are normal at rest. Visually there is near chamber obliteration with  mitral and chordal systolic anterior motion. This correlates with  significant hypotension after dobutamine/atropine that resolved with  salline infusion. Suspect obstructive physiology, possible underlying  HOCM. The baseline electrocardiogram was abnormal. It displayed sinus arrhythmia.   No diagnostic ST segment changes were seen.  Negative stress ECG for inducible ischemia at target heart rate.   EKG:  EKG not ordered today.   Recent Labs: 01/05/2022: ALT 22; BUN 14; Creat 1.05; Hemoglobin 10.6; Platelets 238; Potassium 3.6; Sodium 143; TSH 4.90  Recent Lipid Panel    Component Value Date/Time   CHOL 151 01/05/2022 0000   CHOL 141 09/01/2020 1245   TRIG 116 01/05/2022 0000   HDL 71 01/05/2022 0000   HDL 66 09/01/2020 1245   CHOLHDL 2.1 01/05/2022 0000   LDLCALC 60 01/05/2022 0000    Physical Exam:    Vital Signs: There were no vitals taken for this visit.    Wt Readings from Last 3 Encounters:  01/05/22 172 lb 9.6 oz (78.3  kg)  09/07/21 165 lb 12.8 oz (75.2 kg)  09/06/21 164 lb 14.5 oz (74.8 kg)     General: 71 y.o. female in no acute distress. HEENT: Normocephalic and atraumatic. Sclera clear. EOMs intact. Neck: Supple. No carotid bruits. No JVD. Heart: *** RRR. Distinct S1 and S2. No murmurs, gallops, or rubs. Radial and distal pedal pulses 2+ and equal bilaterally. Lungs: No increased work of breathing. Clear to ausculation bilaterally. No wheezes, rhonchi, or rales.  Abdomen: Soft, non-distended, and non-tender to palpation. Bowel sounds present in all 4 quadrants.  MSK: Normal strength and tone for age. *** Extremities: No lower extremity edema.    Skin: Warm and dry. Neuro: Alert and oriented x3. No focal deficits. Psych: Normal affect. Responds appropriately.   Assessment:    No diagnosis found.  Plan:     Disposition: Follow up in ***   Medication Adjustments/Labs and Tests Ordered: Current medicines are  reviewed at length with the patient today.  Concerns regarding medicines are outlined above.  No orders of the defined types were placed in this encounter.  No orders of the defined types were placed in this encounter.   There are no Patient Instructions on file for this visit.   Signed, Darreld Mclean, PA-C  01/11/2022 2:25 PM     [facility]

## 2022-01-17 ENCOUNTER — Encounter: Payer: Self-pay | Admitting: Student

## 2022-01-18 ENCOUNTER — Encounter: Payer: Self-pay | Admitting: Student

## 2022-01-18 ENCOUNTER — Ambulatory Visit (INDEPENDENT_AMBULATORY_CARE_PROVIDER_SITE_OTHER): Payer: Medicare Other | Admitting: Student

## 2022-01-18 VITALS — BP 122/80 | HR 65 | Ht 62.5 in | Wt 165.0 lb

## 2022-01-18 DIAGNOSIS — Z87898 Personal history of other specified conditions: Secondary | ICD-10-CM

## 2022-01-18 DIAGNOSIS — Z659 Problem related to unspecified psychosocial circumstances: Secondary | ICD-10-CM

## 2022-01-18 DIAGNOSIS — E118 Type 2 diabetes mellitus with unspecified complications: Secondary | ICD-10-CM

## 2022-01-18 DIAGNOSIS — I10 Essential (primary) hypertension: Secondary | ICD-10-CM | POA: Diagnosis not present

## 2022-01-18 DIAGNOSIS — I2510 Atherosclerotic heart disease of native coronary artery without angina pectoris: Secondary | ICD-10-CM | POA: Diagnosis not present

## 2022-01-18 DIAGNOSIS — E785 Hyperlipidemia, unspecified: Secondary | ICD-10-CM

## 2022-01-18 DIAGNOSIS — R002 Palpitations: Secondary | ICD-10-CM

## 2022-01-18 DIAGNOSIS — Z609 Problem related to social environment, unspecified: Secondary | ICD-10-CM
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
## 2022-01-22 ENCOUNTER — Encounter: Payer: Self-pay | Admitting: Medical-Surgical

## 2022-01-22 ENCOUNTER — Ambulatory Visit (INDEPENDENT_AMBULATORY_CARE_PROVIDER_SITE_OTHER): Payer: Medicare Other | Admitting: Medical-Surgical

## 2022-01-22 VITALS — BP 127/75 | HR 71 | Resp 20 | Ht 62.5 in | Wt 168.7 lb

## 2022-01-22 DIAGNOSIS — R208 Other disturbances of skin sensation: Secondary | ICD-10-CM

## 2022-01-22 DIAGNOSIS — R519 Headache, unspecified: Secondary | ICD-10-CM

## 2022-01-22 DIAGNOSIS — E890 Postprocedural hypothyroidism: Secondary | ICD-10-CM

## 2022-01-22 DIAGNOSIS — E119 Type 2 diabetes mellitus without complications: Secondary | ICD-10-CM

## 2022-01-22 DIAGNOSIS — I10 Essential (primary) hypertension: Secondary | ICD-10-CM

## 2022-01-22 DIAGNOSIS — H539 Unspecified visual disturbance: Secondary | ICD-10-CM

## 2022-01-22 DIAGNOSIS — I700 Atherosclerosis of aorta: Secondary | ICD-10-CM

## 2022-01-22 DIAGNOSIS — K219 Gastro-esophageal reflux disease without esophagitis: Secondary | ICD-10-CM

## 2022-01-29 ENCOUNTER — Other Ambulatory Visit: Payer: Self-pay | Admitting: Cardiology

## 2022-01-30 ENCOUNTER — Ambulatory Visit (INDEPENDENT_AMBULATORY_CARE_PROVIDER_SITE_OTHER): Payer: Medicare Other

## 2022-01-30 DIAGNOSIS — R519 Headache, unspecified: Secondary | ICD-10-CM | POA: Diagnosis not present

## 2022-01-30 DIAGNOSIS — H539 Unspecified visual disturbance: Secondary | ICD-10-CM | POA: Diagnosis not present

## 2022-01-30 DIAGNOSIS — R208 Other disturbances of skin sensation: Secondary | ICD-10-CM

## 2022-02-02 NOTE — Progress Notes (Signed)
I would consider inflammatory markers.  Recommend having her see neuro as well.

## 2022-02-03 ENCOUNTER — Other Ambulatory Visit: Payer: Self-pay

## 2022-02-03 DIAGNOSIS — R208 Other disturbances of skin sensation: Secondary | ICD-10-CM

## 2022-02-03 DIAGNOSIS — H539 Unspecified visual disturbance: Secondary | ICD-10-CM

## 2022-02-03 DIAGNOSIS — R519 Headache, unspecified: Secondary | ICD-10-CM

## 2022-02-06 LAB — COMPLETE METABOLIC PANEL WITH GFR
AG Ratio: 1.6 (calc) (ref 1.0–2.5)
ALT: 18 U/L (ref 6–29)
AST: 18 U/L (ref 10–35)
Albumin: 4.1 g/dL (ref 3.6–5.1)
Alkaline phosphatase (APISO): 52 U/L (ref 37–153)
BUN/Creatinine Ratio: 13 (calc) (ref 6–22)
BUN: 18 mg/dL (ref 7–25)
CO2: 29 mmol/L (ref 20–32)
Calcium: 9.7 mg/dL (ref 8.6–10.4)
Chloride: 106 mmol/L (ref 98–110)
Creat: 1.38 mg/dL — ABNORMAL HIGH (ref 0.60–1.00)
Globulin: 2.6 g/dL (calc) (ref 1.9–3.7)
Glucose, Bld: 91 mg/dL (ref 65–99)
Potassium: 4.1 mmol/L (ref 3.5–5.3)
Sodium: 143 mmol/L (ref 135–146)
Total Bilirubin: 0.3 mg/dL (ref 0.2–1.2)
Total Protein: 6.7 g/dL (ref 6.1–8.1)
eGFR: 41 mL/min/{1.73_m2} — ABNORMAL LOW (ref >=60)

## 2022-02-06 LAB — TSH: TSH: 1.76 mIU/L (ref 0.40–4.50)

## 2022-02-10 ENCOUNTER — Other Ambulatory Visit: Payer: Self-pay | Admitting: Osteopathic Medicine

## 2022-02-10 ENCOUNTER — Other Ambulatory Visit: Payer: Self-pay | Admitting: Medical-Surgical

## 2022-02-10 DIAGNOSIS — E1169 Type 2 diabetes mellitus with other specified complication: Secondary | ICD-10-CM

## 2022-02-15 ENCOUNTER — Telehealth: Payer: Self-pay | Admitting: Neurology

## 2022-02-15 NOTE — Telephone Encounter (Signed)
Tried to call patient, LVM for her to call back to discuss MR results. Please follow up.

## 2022-02-15 NOTE — Telephone Encounter (Signed)
Patient left vm stating she never got an explanation about her MR. It looks like she did speak with CC about results.   She is not seeing Neurology for another month and still having issues so wants further detailed explanation of MR results. Please advise.

## 2022-02-17 ENCOUNTER — Ambulatory Visit (INDEPENDENT_AMBULATORY_CARE_PROVIDER_SITE_OTHER): Payer: Medicare Other | Admitting: Psychiatry

## 2022-02-17 VITALS — BP 141/87 | HR 79 | Ht 63.0 in | Wt 166.2 lb

## 2022-02-17 DIAGNOSIS — R519 Headache, unspecified: Secondary | ICD-10-CM

## 2022-02-17 DIAGNOSIS — F22 Delusional disorders: Secondary | ICD-10-CM | POA: Diagnosis not present
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
## 2022-03-06 ENCOUNTER — Other Ambulatory Visit: Payer: Self-pay | Admitting: Medical-Surgical

## 2022-03-06 DIAGNOSIS — E1169 Type 2 diabetes mellitus with other specified complication: Secondary | ICD-10-CM

## 2022-03-09 ENCOUNTER — Other Ambulatory Visit: Payer: Self-pay | Admitting: Medical-Surgical

## 2022-03-09 DIAGNOSIS — E1169 Type 2 diabetes mellitus with other specified complication: Secondary | ICD-10-CM

## 2022-03-10 NOTE — Progress Notes (Deleted)
HPI: FU CAD. Previously followed by Dr. Roslynn Amble. Apparently had myocardial infarction in 2005; cardiac catheterization revealed occluded infarct vessel which was treated medically. Not all records available. Echocardiogram July 2017 showed normal LV function, grade 1 diastolic dysfunction left atrial enlargement. Carotid Dopplers January 2020 showed no significant stenosis. Nuclear study February 2023 showed ejection fraction 61% and no ischemia.  Monitor March 2023 showed sinus rhythm with occasional PAC, rare episodes of PAT and rare PVC.  Dobutamine echocardiogram June 2023 normal.  Since last seen,   Current Outpatient Medications  Medication Sig Dispense Refill   Accu-Chek Softclix Lancets lancets Use as instructed. Accu-chek Guide 100 each 12   AMBULATORY NON FORMULARY MEDICATION Knee-high, medium compression, graduated compression stockings. Apply to lower extremities. [email or web address], Zippered Compression Stockings, medium circ, long length 1 each 0   ammonium lactate (LAC-HYDRIN) 12 % lotion APPLY TOPICALLY TO THE AFFECTED AREA AS NEEDED FOR DRY SKIN 400 g 0   ASPIRIN PO Take 81 mg by mouth daily.     blood glucose meter kit and supplies Dispense based on patient and insurance preference. Check daily. (FOR ICD-10 E10.9, E11.9). 1 each 0   DICLOFENAC SODIUM EX Apply topically as needed.     Dulaglutide (TRULICITY) 1.5 OZ/3.6UY SOPN Inject 0.5 mLs (1.5 mg total) into the skin once a week. 6 mL 5   esomeprazole (NEXIUM) 20 MG capsule TAKE 1 CAPSULE(20 MG) BY MOUTH DAILY 90 capsule 1   fluticasone (FLONASE) 50 MCG/ACT nasal spray Place into both nostrils as needed for allergies or rhinitis.     glucose blood (ACCU-CHEK GUIDE) test strip Use as instructed. Accu-chek Guide 100 each 12   isosorbide mononitrate (IMDUR) 30 MG 24 hr tablet Take 1 tablet (30 mg total) by mouth daily. 90 tablet 3   levothyroxine (SYNTHROID) 75 MCG tablet Take 1 tablet (75 mcg total) by mouth daily  before breakfast. 90 tablet 1   lisinopril (ZESTRIL) 10 MG tablet Take 1 tablet (10 mg total) by mouth daily. 90 tablet 3   metFORMIN (GLUCOPHAGE) 1000 MG tablet TAKE 1 TABLET(1000 MG) BY MOUTH EVERY MORNING 90 tablet 0   metoprolol succinate (TOPROL-XL) 50 MG 24 hr tablet TAKE 1 TABLET(50 MG) BY MOUTH DAILY WITH OR IMMEDIATELY FOLLOWING A MEAL 90 tablet 3   nitroGLYCERIN (NITROSTAT) 0.4 MG SL tablet DISSOLVE 1 TABLET UNDER THE TONGUE EVERY 5 MINUTES, AS NEEDED FOR CHEST PAIN. MAX 3 TABLETS IN 15 MINUTES 25 tablet 6   rosuvastatin (CRESTOR) 40 MG tablet TAKE 1 TABLET(40 MG) BY MOUTH AT BEDTIME 90 tablet 0   No current facility-administered medications for this visit.     Past Medical History:  Diagnosis Date   Aortic atherosclerosis ([place]) 06/10/2017   CAD (coronary artery disease)    Cancer (HCC)    Combined form of age-related cataract, both eyes 04/07/2017   Dermatochalasis of both eyelids 04/07/2017   Diabetes mellitus without complication (HCC)    GERD (gastroesophageal reflux disease)    Hypertension    Hypothyroidism    Lacunar infarction ([place]) 07/20/2018   Left renal mass 10/04/2018   1.7 cm hypoechoic mass 10/04/2018   Lumbar degenerative disc disease 03/09/2017   Myocardial infarction ([place]) 2005   Normocytic anemia 12/14/2018   Palpitations    Papillary thyroid carcinoma ([place])    Posterior vitreous detachment, right eye 04/07/2017    Past Surgical History:  Procedure Laterality Date   TOTAL THYROIDECTOMY  2016    Social History  Socioeconomic History   Marital status: Single    Spouse name: Not on file   Number of children: 1   Years of education: Not on file   Highest education level: Not on file  Occupational History   Not on file  Tobacco Use   Smoking status: Former   Smokeless tobacco: Never  Vaping Use   Vaping Use: Never used  Substance and Sexual Activity   Alcohol use: No   Drug use: No   Sexual activity: Not Currently  Other Topics Concern    Not on file  Social History Narrative   Not on file   Social Determinants of Health   Financial Resource Strain: Not on file  Food Insecurity: Not on file  Transportation Needs: Not on file  Physical Activity: Not on file  Stress: Not on file  Social Connections: Not on file  Intimate Partner Violence: Not on file    Family History  Problem Relation Age of Onset   Hypertension Mother    Hypertension Sister    Hypertension Brother    Breast cancer Daughter    Hypertension Sister     ROS: no fevers or chills, productive cough, hemoptysis, dysphasia, odynophagia, melena, hematochezia, dysuria, hematuria, rash, seizure activity, orthopnea, PND, pedal edema, claudication. Remaining systems are negative.  Physical Exam: Well-developed well-nourished in no acute distress.  Skin is warm and dry.  HEENT is normal.  Neck is supple.  Chest is clear to auscultation with normal expansion.  Cardiovascular exam is regular rate and rhythm.  Abdominal exam nontender or distended. No masses palpated. Extremities show no edema. neuro grossly intact  ECG- personally reviewed  A/P  1 coronary artery disease-continue aspirin and statin.  Recent nuclear study and dobutamine echo showed no ischemia.  Plan medical therapy.  2 palpitations-previous monitor showed no significant arrhythmias.  3 hypertension-blood pressure controlled.  Continue present medical regimen.  4 hyperlipidemia-continue statin.  Kirk Ruths, MD

## 2022-03-12 ENCOUNTER — Ambulatory Visit: Payer: Medicare Other | Admitting: Psychiatry

## 2022-03-22 ENCOUNTER — Telehealth: Payer: Self-pay

## 2022-03-22 NOTE — Telephone Encounter (Signed)
Should be refills on medications.  Has she checked with the pharmacy?  Also has she asked for the medications back from the neighbor?
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
## 2022-03-24 ENCOUNTER — Ambulatory Visit: Payer: Medicare Other | Admitting: Cardiology

## 2022-03-30 ENCOUNTER — Encounter: Payer: Self-pay | Admitting: Physician Assistant

## 2022-03-30 ENCOUNTER — Ambulatory Visit: Payer: Medicare Other | Attending: Cardiology | Admitting: Physician Assistant

## 2022-03-30 VITALS — BP 162/80 | HR 73 | Ht 63.0 in | Wt 168.2 lb

## 2022-03-30 DIAGNOSIS — R0789 Other chest pain: Secondary | ICD-10-CM

## 2022-03-30 DIAGNOSIS — I10 Essential (primary) hypertension: Secondary | ICD-10-CM | POA: Diagnosis not present

## 2022-03-30 DIAGNOSIS — E785 Hyperlipidemia, unspecified: Secondary | ICD-10-CM | POA: Diagnosis not present

## 2022-03-30 DIAGNOSIS — I2510 Atherosclerotic heart disease of native coronary artery without angina pectoris: Secondary | ICD-10-CM | POA: Diagnosis not present

## 2022-03-30 DIAGNOSIS — E119 Type 2 diabetes mellitus without complications: Secondary | ICD-10-CM

## 2022-03-30 NOTE — Patient Instructions (Addendum)
Medication Instructions:  Your physician recommends that you continue on your current medications as directed. Please refer to the Current Medication list given to you today.  *If you need a refill on your cardiac medications before your next appointment, please call your pharmacy*  Lab Work: NONE ordered at this time of appointment   If you have labs (blood work) drawn today and your tests are completely normal, you will receive your results only by: [place] (if you have MyChart) OR A paper copy in the mail If you have any lab test that is abnormal or we need to change your treatment, we will call you to review the results.  Testing/Procedures: NONE ordered at this time of appointment   Follow-Up: At [facility], you and your health needs are our priority.  As part of our continuing mission to provide you with exceptional heart care, we have created designated Provider Care Teams.  These Care Teams include your primary Cardiologist (physician) and Advanced Practice Providers (APPs -  Physician Assistants and Nurse Practitioners) who all work together to provide you with the care you need, when you need it.  We recommend signing up for the patient portal called "MyChart".  Sign up information is provided on this After Visit Summary.  MyChart is used to connect with patients for Virtual Visits (Telemedicine).  Patients are able to view lab/test results, encounter notes, upcoming appointments, etc.  Non-urgent messages can be sent to your provider as well.   To learn more about what you can do with MyChart, go to [email or web address].    Your next appointment:   3-6 month(s)  The format for your next appointment:   In Person  Provider:   Kirk Ruths, MD   ONLY  Other Instructions Continue monitor blood pressure at home. If systolic (top number) is consistently greater than 140 or higher, INCREASE Lisinopril to 20 mg daily and give our office   Important  Information About Sugar

## 2022-03-30 NOTE — Progress Notes (Signed)
Cardiology Office Note:    Date:  04/01/2022   ID:  Sara Gardner, DOB 05-Jul-1951, MRN [id number]  PCP:  Samuel Bouche, NP   [place] Cardiologist:  Kirk Ruths, MD     Referring MD: Samuel Bouche, NP   Chief Complaint  Patient presents with   Follow-up    Seen for Dr. Stanford Breed    History of Present Illness:    Sara Gardner is a 71 y.o. female with a hx of CAD, palpitation, hypertension, hyperlipidemia, DM2, GERD, papillary thyroid cancer s/p thyroidectomy 10/2014 with resultant hypothyroidism.  Patient has a history of CAD that was previously followed by Dr. Roslynn Amble of [place].  She reportedly had an MRI in 2005, cardiac catheterization at the time showed occluded vessel which was treated medically.  We have no record of this.  Patient was referred to Dr. Stanford Breed in Sept 2020 to establish care.  She was seen in the ED in December 2020 for atypical chest pain.  Troponin was normal, D-dimer was elevated.  Patient will left AMA before complete work-up was done.  She was seen by Dr. Stanford Breed in February 2023 at which time she reported chest pain and palpitation when she gets upset.  She also reported that she felt as though her neighbor was using the electromagnetic wave against her.  A Lexiscan Myoview was ordered, finding consistent with artifact from breast attenuation but no evidence of ischemia or infarct.  2-week monitor showed underlying sinus rhythm with brief episode of PAT and some PAC and PVCs but no significant arrhythmia.   Patient was seen in the ED at [facility] in June 2023 with chest pain.  EKG showed no ischemic changes.  Troponin negative.  Dobutamine stress test shows no evidence of ischemia, LVOT and the intracavitary gradient was not obtained at peak stress and was normal at rest.  However visually, there was near chamber obliteration with mitral and the chordal systolic anterior motion which correlated with significant hypotension  after dobutamine/atropine that resolved with saline infusion.  He was seen by cardiology service who felt that this was due to extensive hypertensive heart disease rather than HOCM.  Patient was last seen by Sande Rives on 01/18/2022 at which time the patient was doing well.  Patient presents today for follow-up.  Her blood pressure was high on arrival, I rechecked it myself and it was still high in the 160s.  Patient was very irritated she did not get to see Dr. Stanford Breed today.  I offered her to cancel today's visit and reschedule her to this afternoon with Dr. Stanford Breed, however she decided not to do that.  She denies any recent chest pain or worsening dyspnea.  When I asked her about her home and the social situation, she did not wish to say anymore.  She says someone took her medication a few weeks back, therefore she did not take any of her medication for 2 weeks, she just restarted on them about a week ago.  She is aware that if after 1 more week of blood pressure medication if her systolic blood pressure is still greater than 140, she has been instructed to increase lisinopril to 20 mg daily.  Otherwise, she can follow-up with Dr. Stanford Breed for the next visit.  Past Medical History:  Diagnosis Date   Aortic atherosclerosis ([place]) 06/10/2017   CAD (coronary artery disease)    Cancer ([place])    Combined form of age-related cataract, both eyes 04/07/2017  Dermatochalasis of both eyelids 04/07/2017   Diabetes mellitus without complication (HCC)    GERD (gastroesophageal reflux disease)    Hypertension    Hypothyroidism    Lacunar infarction ([place]) 07/20/2018   Left renal mass 10/04/2018   1.7 cm hypoechoic mass 10/04/2018   Lumbar degenerative disc disease 03/09/2017   Myocardial infarction [facility]) 2005   Normocytic anemia 12/14/2018   Palpitations    Papillary thyroid carcinoma ([place])    Posterior vitreous detachment, right eye 04/07/2017    Past Surgical History:  Procedure Laterality Date    TOTAL THYROIDECTOMY  2016    Current Medications: Current Meds  Medication Sig   Accu-Chek Softclix Lancets lancets Use as instructed. Accu-chek Guide   AMBULATORY NON FORMULARY MEDICATION Knee-high, medium compression, graduated compression stockings. Apply to lower extremities. [email or web address], Zippered Compression Stockings, medium circ, long length   ammonium lactate (LAC-HYDRIN) 12 % lotion APPLY TOPICALLY TO THE AFFECTED AREA AS NEEDED FOR DRY SKIN   ASPIRIN PO Take 81 mg by mouth daily.   blood glucose meter kit and supplies Dispense based on patient and insurance preference. Check daily. (FOR ICD-10 E10.9, E11.9).   DICLOFENAC SODIUM EX Apply topically as needed.   Dulaglutide (TRULICITY) 1.5 ZW/2.5EN SOPN Inject 0.5 mLs (1.5 mg total) into the skin once a week.   esomeprazole (NEXIUM) 20 MG capsule TAKE 1 CAPSULE(20 MG) BY MOUTH DAILY   fluticasone (FLONASE) 50 MCG/ACT nasal spray Place into both nostrils as needed for allergies or rhinitis.   glucose blood (ACCU-CHEK GUIDE) test strip Use as instructed. Accu-chek Guide   isosorbide mononitrate (IMDUR) 30 MG 24 hr tablet Take 1 tablet (30 mg total) by mouth daily.   levothyroxine (SYNTHROID) 75 MCG tablet Take 1 tablet (75 mcg total) by mouth daily before breakfast.   lisinopril (ZESTRIL) 10 MG tablet Take 1 tablet (10 mg total) by mouth daily.   metFORMIN (GLUCOPHAGE) 1000 MG tablet TAKE 1 TABLET(1000 MG) BY MOUTH EVERY MORNING   metoprolol succinate (TOPROL-XL) 50 MG 24 hr tablet TAKE 1 TABLET(50 MG) BY MOUTH DAILY WITH OR IMMEDIATELY FOLLOWING A MEAL   nitroGLYCERIN (NITROSTAT) 0.4 MG SL tablet DISSOLVE 1 TABLET UNDER THE TONGUE EVERY 5 MINUTES, AS NEEDED FOR CHEST PAIN. MAX 3 TABLETS IN 15 MINUTES   rosuvastatin (CRESTOR) 40 MG tablet TAKE 1 TABLET(40 MG) BY MOUTH AT BEDTIME     Allergies:   Bee venom, Tuberculin tests, and Tape   Social History   Socioeconomic History   Marital status: Single    Spouse name: Not  on file   Number of children: 1   Years of education: Not on file   Highest education level: Not on file  Occupational History   Not on file  Tobacco Use   Smoking status: Former   Smokeless tobacco: Never  Vaping Use   Vaping Use: Never used  Substance and Sexual Activity   Alcohol use: No   Drug use: No   Sexual activity: Not Currently  Other Topics Concern   Not on file  Social History Narrative   Not on file   Social Determinants of Health   Financial Resource Strain: Not on file  Food Insecurity: Not on file  Transportation Needs: Not on file  Physical Activity: Not on file  Stress: Not on file  Social Connections: Not on file     Family History: The patient's family history includes Breast cancer in her daughter; Hypertension in her brother, mother, sister, and sister.  ROS:  Please see the history of present illness.     All other systems reviewed and are negative.  EKGs/Labs/Other Studies Reviewed:    The following studies were reviewed today:  Myoview 09/21/2021  There was < 53m of horizontal ST segment depression in the inferolateral leads during exercise.   LV perfusion is abnormal. There is no evidence of ischemia. There is no evidence of infarction. Defect 1: There is a small defect with mild reduction in uptake present in the mid inferolateral location(s) that is fixed. There is normal wall motion in the defect area. Consistent with artifact caused by breast attenuation.   The study is normal. Findings are consistent with no prior ischemia. The study is low risk.   Left ventricular function is normal. Nuclear stress EF: 61 %. The left ventricular ejection fraction is normal (55-65%). End diastolic cavity size is normal. End systolic cavity size is normal.   Prior study not available for comparison.    EKG:  EKG is ordered today.  The ekg ordered today demonstrates normal sinus rhythm, occasional PACs, no significant ST-T wave changes.  Recent  Labs: 01/05/2022: Hemoglobin 10.6; Platelets 238 02/05/2022: ALT 18; Gardner 18; Creat 1.38; Potassium 4.1; Sodium 143; TSH 1.76  Recent Lipid Panel    Component Value Date/Time   CHOL 151 01/05/2022 0000   CHOL 141 09/01/2020 1245   TRIG 116 01/05/2022 0000   HDL 71 01/05/2022 0000   HDL 66 09/01/2020 1245   CHOLHDL 2.1 01/05/2022 0000   LDLCALC 60 01/05/2022 0000     Risk Assessment/Calculations:           Physical Exam:    VS:  BP (!) 162/80   Pulse 73   Ht _0  (1.6 m)   Wt 168 lb 3.2 oz (76.3 kg)   SpO2 99%   BMI 29.80 kg/m        Wt Readings from Last 3 Encounters:  03/30/22 168 lb 3.2 oz (76.3 kg)  02/17/22 166 lb 4 oz (75.4 kg)  01/22/22 168 lb 11.2 oz (76.5 kg)     GEN:  Well nourished, well developed in no acute distress HEENT: Normal NECK: No JVD; No carotid bruits LYMPHATICS: No lymphadenopathy CARDIAC: RRR, no murmurs, rubs, gallops RESPIRATORY:  Clear to auscultation without rales, wheezing or rhonchi  ABDOMEN: Soft, non-tender, non-distended MUSCULOSKELETAL:  No edema; No deformity  SKIN: Warm and dry NEUROLOGIC:  Alert and oriented x 3 PSYCHIATRIC:  Normal affect   ASSESSMENT:    1. Primary hypertension   2. Coronary artery disease involving native coronary artery of native heart without angina pectoris   3. Hyperlipidemia LDL goal <70   4. Controlled type 2 diabetes mellitus without complication, without long-term current use of insulin (HCC)    PLAN:    In order of problems listed above:  Uncontrolled hypertension: According to the patient, she did not have access to her own medication for a few weeks, however has been able to get hold of her medication since.  She has resumed her medications since about a week ago.  Her blood pressure is still quite high today.  Systolic blood pressure is in the 160s.  She will continue on the current therapy for 1 more week, if systolic blood pressure remain elevated, she may increase lisinopril to 20 mg  daily and let [place]  CAD: Denies any recent chest pain  Hyperlipidemia: On Crestor  DM2: Managed by primary care provider  Follow-up with Dr. CStanford Breedonly.  Patient  was quite upset today as she was under the impression today's visit was with Dr. Stanford Breed.  I offered to cancel today's visit and reschedule with Dr. Stanford Breed this afternoon as he had some openings, however patient refused to return this afternoon.  Some of her elevated blood pressure today may be attributed to being irritated.  I recommended follow-up with MD only for the next visit           Medication Adjustments/Labs and Tests Ordered: Current medicines are reviewed at length with the patient today.  Concerns regarding medicines are outlined above.  Orders Placed This Encounter  Procedures   EKG 12-Lead   No orders of the defined types were placed in this encounter.   Patient Instructions  Medication Instructions:  Your physician recommends that you continue on your current medications as directed. Please refer to the Current Medication list given to you today.  *If you need a refill on your cardiac medications before your next appointment, please call your pharmacy*  Lab Work: NONE ordered at this time of appointment   If you have labs (blood work) drawn today and your tests are completely normal, you will receive your results only by: [facility] (if you have MyChart) OR A paper copy in the mail If you have any lab test that is abnormal or we need to change your treatment, we will call you to review the results.  Testing/Procedures: NONE ordered at this time of appointment   Follow-Up: At [facility], you and your health needs are our priority.  As part of our continuing mission to provide you with exceptional heart care, we have created designated Provider Care Teams.  These Care Teams include your primary Cardiologist (physician) and Advanced Practice Providers (APPs -  Physician  Assistants and Nurse Practitioners) who all work together to provide you with the care you need, when you need it.  We recommend signing up for the patient portal called "MyChart".  Sign up information is provided on this After Visit Summary.  MyChart is used to connect with patients for Virtual Visits (Telemedicine).  Patients are able to view lab/test results, encounter notes, upcoming appointments, etc.  Non-urgent messages can be sent to your provider as well.   To learn more about what you can do with MyChart, go to [email or web address].    Your next appointment:   3-6 month(s)  The format for your next appointment:   In Person  Provider:   Kirk Ruths, MD   ONLY  Other Instructions Continue monitor blood pressure at home. If systolic (top number) is consistently greater than 140 or higher, INCREASE Lisinopril to 20 mg daily and give our office   Important Information About Sugar         Hilbert Corrigan, [place]  04/01/2022 9:58 PM    [place]

## 2022-04-01 ENCOUNTER — Encounter: Payer: Self-pay | Admitting: Physician Assistant

## 2022-04-22 ENCOUNTER — Ambulatory Visit
Admission: EM | Admit: 2022-04-22 | Discharge: 2022-04-22 | Disposition: A | Discharge status: Home / Self Care | Payer: Medicare Other | Attending: Emergency Medicine | Admitting: Emergency Medicine

## 2022-04-22 DIAGNOSIS — S46911A Strain of unspecified muscle, fascia and tendon at shoulder and upper arm level, right arm, initial encounter: Secondary | ICD-10-CM | POA: Diagnosis not present

## 2022-04-22 DIAGNOSIS — I959 Hypotension, unspecified: Secondary | ICD-10-CM | POA: Diagnosis not present

## 2022-04-22 MED ORDER — BACLOFEN 10 MG PO TABS: 5.0000 mg | ORAL_TABLET | Freq: Two times a day (BID) | ORAL | 0 refills | Status: DC

## 2022-04-22 MED ORDER — KETOROLAC TROMETHAMINE 30 MG/ML IJ SOLN
7.5000 mg | Freq: Once | INTRAMUSCULAR | Status: AC
  Administered 2022-04-22: 7.5 mg via INTRAMUSCULAR

## 2022-04-22 NOTE — Discharge Instructions (Addendum)
You have been treated for right shoulder muscle strain today.  I recommend that you use ice on the affected area to relieve inflammation.  I have prescribed a muscle relaxant today, as we discussed, please take medication at night, medication can cause drowsiness.  You were given a Toradol shot in office today, this is an nonsteroidal anti-inflammatory medication, please do not take any more NSAIDs, due to your low kidney function.  For example, do not take any Advil or ibuprofen.   As discussed, please follow-up with your PCP today with a phone call.  They will need to adjust your blood pressure medications.  Please continue to monitor your blood pressure at home on a daily basis to ensure that low blood pressure is not occurring.  If you develop any symptoms such as dizziness, lightheadedness, confusion, palpitations please go to the nearest emergency department.

## 2022-04-22 NOTE — ED Provider Notes (Signed)
[name]    CSN: [id number] Arrival date & time: 04/22/22  1223      History   Chief Complaint Chief Complaint  Patient presents with   Shoulder Pain    RT    HPI Sara Gardner is a 71 y.o. female.  Patient presents for right shoulder pain that started 2 weeks ago.  Patient denies fall or trauma.  Patient states the pain could be due to the "electromagnetic field".  Patient endorses pain starts on the right side of neck and radiates down the shoulder.  Patient endorses soreness and swelling at the site.  Patient denies decreased in range of motion but endorses pain with movement.  Patient denies history of shoulder surgery or problems with right shoulder.  Patient has used topical Voltaren with no relief of symptoms.  Patient presents in office with an initial blood pressure of 87/58.  Patient has history of hypertension.  Patient states that she has switched her medications to her old medication prescription because she has lost her new prescriptions.  Patient denies any dizziness, lightheadedness, vision changes, headache, palpitations, or overall unwell feeling.    Shoulder Pain Associated symptoms: neck pain     Past Medical History:  Diagnosis Date   Aortic atherosclerosis ([place]) 06/10/2017   CAD (coronary artery disease)    Cancer ([place])    Combined form of age-related cataract, both eyes 04/07/2017   Dermatochalasis of both eyelids 04/07/2017   Diabetes mellitus without complication (HCC)    GERD (gastroesophageal reflux disease)    Hypertension    Hypothyroidism    Lacunar infarction ([place]) 07/20/2018   Left renal mass 10/04/2018   1.7 cm hypoechoic mass 10/04/2018   Lumbar degenerative disc disease 03/09/2017   Myocardial infarction ([place]) 2005   Normocytic anemia 12/14/2018   Palpitations    Papillary thyroid carcinoma ([place])    Posterior vitreous detachment, right eye 04/07/2017    Patient Active Problem List   Diagnosis Date Noted    Hypotension 05/26/2021   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/05/2019   Cervicalgia 04/05/2019   Eustachian tube dysfunction, bilateral 04/05/2019   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   White matter abnormality on MRI of brain 04/05/2019   Myalgia due to statin 12/14/2018   Normocytic anemia 12/14/2018   At moderate risk for fall 10/03/2018   Bilateral renal cysts 10/03/2018   Facet arthropathy, lumbosacral 10/03/2018   Lacunar infarction ([place]) 07/20/2018   Controlled type 2 diabetes mellitus without complication, without long-term current use of insulin ([place]) 03/08/2018   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/23/2017   Overactive bladder 08/17/2017   Equinus contracture of ankle 07/27/2017   Hammer toes of both feet 07/27/2017   Dermatophytosis, nail 07/27/2017   Calcaneal spur of both feet 07/27/2017   Aortic atherosclerosis ([place]) 06/10/2017   Vitreous detachment of right eye 05/12/2017   Combined form of age-related cataract, both eyes 04/07/2017   Dermatochalasis of both eyelids 04/07/2017   Posterior vitreous detachment, right eye 04/07/2017   Postural kyphosis of cervicothoracic region 03/09/2017   Lumbar degenerative disc disease 03/09/2017   Primary osteoarthritis of left knee 03/04/2017   Chronic pain of left knee 03/04/2017   Bilateral primary osteoarthritis of knee 12/15/2016   Allergy to honey bee venom 12/15/2016   Hypertension 02/16/2016   DM type 2 with diabetic dyslipidemia ([place]) 02/10/2016   Gastroesophageal reflux disease without esophagitis 02/10/2016   Hypothyroidism, 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   Diabetic sensorimotor polyneuropathy ([place]) 10/27/2015   Papillary thyroid carcinoma (  [place]) 08/09/2014   Post-menopausal atrophic vaginitis 06/24/2014   Left thyroid nodule 01/29/2014   Nodule of left lung 01/21/2014   Class 1 obesity due to excess calories with serious comorbidity and body mass index (BMI) of 30.0 to 30.9 in adult 06/12/2012    Palpitations 11/29/2011   Abnormal mammogram of right breast 08/30/2011   Arthritis of left knee 05/24/2011   CAD (coronary artery disease) 05/24/2011   History of acute myocardial infarction of inferior wall 05/24/2011    Past Surgical History:  Procedure Laterality Date   TOTAL THYROIDECTOMY  2016    OB History   No obstetric history on file.      Home Medications    Prior to Admission medications   Medication Sig Start Date End Date Taking? Authorizing Provider  baclofen (LIORESAL) 10 MG tablet Take 0.5 tablets (5 mg total) by mouth 2 (two) times daily. 04/22/22  Yes Flossie Dibble, NP  Accu-Chek Softclix Lancets lancets Use as instructed. Accu-chek Guide 10/23/21   Samuel Bouche, NP  AMBULATORY NON FORMULARY MEDICATION Knee-high, medium compression, graduated compression stockings. Apply to lower extremities. [email or web address], Zippered Compression Stockings, medium circ, long length 12/15/16   [name], PA-C  ammonium lactate (LAC-HYDRIN) 12 % lotion APPLY TOPICALLY TO THE AFFECTED AREA AS NEEDED FOR DRY SKIN 02/10/22   Samuel Bouche, NP  ASPIRIN PO Take 81 mg by mouth daily.    [provider]  blood glucose meter kit and supplies Dispense based on patient and insurance preference. Check daily. (FOR ICD-10 E10.9, E11.9). 10/23/21   Samuel Bouche, NP  DICLOFENAC SODIUM EX Apply topically as needed.    [provider]  Dulaglutide (TRULICITY) 1.5 ON/6.2XB SOPN Inject 0.5 mLs (1.5 mg total) into the skin once a week. 03/19/20   Emeterio Reeve, DO  esomeprazole (NEXIUM) 20 MG capsule TAKE 1 CAPSULE(20 MG) BY MOUTH DAILY 09/01/20   Emeterio Reeve, DO  fluticasone [facility]) 50 MCG/ACT nasal spray Place into both nostrils as needed for allergies or rhinitis.    [provider]  glucose blood (ACCU-CHEK GUIDE) test strip Use as instructed. Accu-chek Guide 10/23/21   Samuel Bouche, NP  isosorbide mononitrate (IMDUR) 30 MG 24 hr tablet Take  1 tablet (30 mg total) by mouth daily. 03/23/21   Lelon Perla, MD  levothyroxine (SYNTHROID) 75 MCG tablet Take 1 tablet (75 mcg total) by mouth daily before breakfast. 01/05/22   Samuel Bouche, NP  lisinopril (ZESTRIL) 10 MG tablet Take 1 tablet (10 mg total) by mouth daily. 01/05/22   Samuel Bouche, NP  metFORMIN (GLUCOPHAGE) 1000 MG tablet TAKE 1 TABLET(1000 MG) BY MOUTH EVERY MORNING 09/02/20   Emeterio Reeve, DO  metoprolol succinate (TOPROL-XL) 50 MG 24 hr tablet TAKE 1 TABLET(50 MG) BY MOUTH DAILY WITH OR IMMEDIATELY FOLLOWING A MEAL 01/29/22   Crenshaw, Denice Bors, MD  nitroGLYCERIN (NITROSTAT) 0.4 MG SL tablet DISSOLVE 1 TABLET UNDER THE TONGUE EVERY 5 MINUTES, AS NEEDED FOR CHEST PAIN. MAX 3 TABLETS IN 15 MINUTES 09/07/21   Lelon Perla, MD  rosuvastatin (CRESTOR) 40 MG tablet TAKE 1 TABLET(40 MG) BY MOUTH AT BEDTIME 02/10/22   Samuel Bouche, NP    Family History Family History  Problem Relation Age of Onset   Hypertension Mother    Hypertension Sister    Hypertension Brother    Breast cancer Daughter    Hypertension Sister     Social History Social History   Tobacco Use   Smoking status: Former  Smokeless tobacco: Never  Vaping Use   Vaping Use: Never used  Substance Use Topics   Alcohol use: No   Drug use: No     Allergies   Bee venom, Tuberculin tests, and Tape   Review of Systems Review of Systems  Constitutional:  Negative for activity change.  Eyes:  Negative for visual disturbance.  Respiratory: Negative.    Cardiovascular: Negative.   Musculoskeletal:  Positive for myalgias and neck pain. Negative for neck stiffness.  Neurological:  Negative for dizziness, tremors, syncope, facial asymmetry, speech difficulty, weakness, light-headedness, numbness and headaches.     Physical Exam Triage Vital Signs ED Triage Vitals  Enc Vitals Group     BP 04/22/22 1237 (!) 87/58     Pulse Rate 04/22/22 1237 73     Resp 04/22/22 1237 17     Temp 04/22/22 1237 97.8  F (36.6 C)     Temp Source 04/22/22 1237 Oral     SpO2 04/22/22 1237 98 %     Weight --      Height --      Head Circumference --      Peak Flow --      Pain Score 04/22/22 1235 8     Pain Loc --      Pain Edu? --      Excl. in [place]? --    No data found.  Updated Vital Signs BP 98/60 (BP Location: Left Arm)   Pulse 73   Temp 97.8 F (36.6 C) (Oral)   Resp 17   SpO2 98%   Visual Acuity Right Eye Distance:   Left Eye Distance:   Bilateral Distance:    Right Eye Near:   Left Eye Near:    Bilateral Near:     Physical Exam Constitutional:      Appearance: Normal appearance.  HENT:     Head: Normocephalic.  Cardiovascular:     Rate and Rhythm: Normal rate and regular rhythm.  Pulmonary:     Effort: Pulmonary effort is normal.     Breath sounds: Normal breath sounds.  Musculoskeletal:     Right shoulder: Swelling present. No deformity or bony tenderness. Normal range of motion. Normal strength.     Left shoulder: Normal.     Right upper arm: Normal.     Left upper arm: Normal.       Arms:     Cervical back: Normal range of motion.     Comments: Patient endorses tenderness upon palpation of posterior side of shoulder, located approximately at sternocleidomastoid region.   Skin:         Comments: No redness present. No rash present at sight of pain.   Neurological:     Mental Status: She is alert.     Motor: No weakness.     Comments: Alert and oriented x 4.       UC Treatments / Results  Labs (all labs ordered are listed, but only abnormal results are displayed) Labs Reviewed - No data to display  EKG   Radiology No results found.  Procedures Procedures (including critical care time)  Medications Ordered in UC Medications  ketorolac (TORADOL) 30 MG/ML injection 7.5 mg (7.5 mg Intramuscular Given 04/22/22 1316)    Initial Impression / Assessment and Plan / UC Course  I have reviewed the triage vital signs and the nursing notes.  Pertinent labs  & imaging results that were available during my care of the patient were reviewed by  me and considered in my medical decision making (see chart for details).  Patient was treated for muscle strain of right shoulder.  Patient was given Toradol in office today.  Was advised to refrain from any NSAID use while at home due to past kidney function results.  Patient was given baclofen to be taken at night as discussed with the patient.  Patient was told to referred to ECP if symptoms worsen or orthopedic provider.  Patient was counseled on hypotension.  Patient was told to call PCP immediately.  Patient was unsure about the correct medications that she is taking for hypertension.  Patient states she will need to go home and verify medications.  Patient was told to make sure to also schedule appointment with PCP.  Patient has appointment scheduled for December but follow-up is required sooner.  Patient was counseled to check blood pressure at home and ensure that hypotension is not occurring.  Patient made aware to go to the emergency room if dizziness, lightheadedness, confusion, palpitations occur at home.     Final Clinical Impressions(s) / UC Diagnoses   Final diagnoses:  Muscle strain of right shoulder, initial encounter  Hypotension, unspecified hypotension type     Discharge Instructions      You have been treated for right shoulder muscle strain today.  I recommend that you use ice on the affected area to relieve inflammation.  I have prescribed a muscle relaxant today, as we discussed, please take medication at night, medication can cause drowsiness.  You were given a Toradol shot in office today, this is an nonsteroidal anti-inflammatory medication, please do not take any more NSAIDs, due to your low kidney function.  For example, do not take any Advil or ibuprofen.   As discussed, please follow-up with your PCP today with a phone call.  They will need to adjust your blood pressure  medications.  Please continue to monitor your blood pressure at home on a daily basis to ensure that low blood pressure is not occurring.  If you develop any symptoms such as dizziness, lightheadedness, confusion, palpitations please go to the nearest emergency department.     ED Prescriptions     Medication Sig Dispense Auth. Provider   baclofen (LIORESAL) 10 MG tablet Take 0.5 tablets (5 mg total) by mouth 2 (two) times daily. 6 each Flossie Dibble, NP      PDMP not reviewed this encounter.   Flossie Dibble, NP 04/22/22 1350
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
## 2022-04-23 ENCOUNTER — Encounter: Payer: Self-pay | Admitting: Medical-Surgical

## 2022-04-23 ENCOUNTER — Ambulatory Visit (INDEPENDENT_AMBULATORY_CARE_PROVIDER_SITE_OTHER): Payer: Medicare Other | Admitting: Medical-Surgical

## 2022-04-23 VITALS — BP 83/47 | HR 86 | Resp 20 | Ht 63.0 in | Wt 164.9 lb

## 2022-04-23 DIAGNOSIS — E119 Type 2 diabetes mellitus without complications: Secondary | ICD-10-CM

## 2022-04-23 DIAGNOSIS — I952 Hypotension due to drugs: Secondary | ICD-10-CM | POA: Diagnosis not present

## 2022-04-23 DIAGNOSIS — S46911D Strain of unspecified muscle, fascia and tendon at shoulder and upper arm level, right arm, subsequent encounter: Secondary | ICD-10-CM | POA: Diagnosis not present

## 2022-04-23 DIAGNOSIS — Z23 Encounter for immunization: Secondary | ICD-10-CM | POA: Diagnosis not present

## 2022-04-23 MED ORDER — METHOCARBAMOL 500 MG PO TABS: 500.0000 mg | ORAL_TABLET | Freq: Three times a day (TID) | ORAL | 0 refills | Status: DC | PRN

## 2022-04-23 NOTE — Progress Notes (Signed)
Established Patient Office Visit  Subjective   Patient ID: Sara Gardner, female   DOB: 10/31/1950 Age: 71 y.o. MRN: [id number]   Chief Complaint  Patient presents with   Hypertension   HPI Pleasant 71 year old female presenting today for evaluation of her blood pressure. She has been seeing cardiology regarding management of her hypertension and recently made some changes to her medications. She was instructed to reduce her Lisinopril to '10mg'$  and her Imdur to '30mg'$  daily. She was provided with new prescriptions for these doses but reports that her medication disappeared and the pharmacy would not refill them since she had just picked up a 90-day supply. In absence of her new doses, she returned to taking her old doses of medication including Imdur '60mg'$  daily and Lisinopril-HCTZ 20-12.'5mg'$  daily. Yesterday, she was seen at the Urgent care where she was found to be hypotensive. Although asymptomatic, she was urged to see her PCP for medication management as soon as possible. Denies chest pain, shortness of breath, dizziness, vision changes, headaches, unusual fatigue, lower extremity swelling, near syncope, orthostasis, and syncopal spells.   Muscle strain: her trip to urgent care for pain in the right shoulder led to a diagnosis of a muscle strain. She was prescribed Baclofen but after she read about the medication, she decided not to take it due to the potential side effects and negative effects with long term use. Still having pain that is interfering with her daily activities.    Objective:    Vitals:   04/23/22 1126  BP: (!) 83/47  Pulse: 86  Resp: 20  Height: '5\' 3"'$  (1.6 m)  Weight: 164 lb 14.4 oz (74.8 kg)  SpO2: 97%  BMI (Calculated): 29.22    Physical Exam Vitals and nursing note reviewed.  Constitutional:      General: She is not in acute distress.    Appearance: Normal appearance. She is not ill-appearing.  HENT:     Head: Normocephalic and atraumatic.   Cardiovascular:     Rate and Rhythm: Normal rate and regular rhythm.     Pulses: Normal pulses.     Heart sounds: Normal heart sounds.  Pulmonary:     Effort: Pulmonary effort is normal. No respiratory distress.     Breath sounds: Normal breath sounds. No wheezing, rhonchi or rales.  Skin:    General: Skin is warm and dry.  Neurological:     Mental Status: She is alert and oriented to person, place, and time.  Psychiatric:        Mood and Affect: Mood normal.        Behavior: Behavior normal.        Thought Content: Thought content normal.        Judgment: Judgment normal.   No results found for this or any previous visit (from the past 24 hour(s)).   {Labs (Optional):23779}  The ASCVD Risk score (Arnett DK, et al., 2019) failed to calculate for the following reasons:   The patient has a prior MI or stroke diagnosis   Assessment & Plan:   1. Hypotension due to drugs BP low on arrival but slightly better on recheck with systolic in the low 72Z. Recommend reducing her dose of Imdur back to '30mg'$  daily (does have this in her medication bag). Hold Lisinopril-HCTZ over the weekend. Continue Toprol-XL. Monitor BP at home with a goal of less than 130/80 but no less than 100/60. Patient is asymptomatic today without evidence of orthostasis. Advised to stay well hydrated  to avoid dehydration as this can worsen hypotension. Sending chart to Cardiology for their review and recommendations.   2. Controlled type 2 diabetes mellitus without complication, without long-term current use of insulin (HCC) POCT microalbumin normal.  - POCT UA - Microalbumin  3. Muscle strain of the right shoulder Discontinue Baclofen. Start Robaxin '500mg'$  TID prn. Discussed conservative measures for pain.   4. Need for influenza vaccination Flu vaccine given in office today.  - Flu vaccine HIGH DOSE PF (Fluzone High dose)  Return if symptoms worsen or fail to  improve.  ___________________________________________ Clearnce Sorrel, DNP, APRN, FNP-BC [facility] [place]

## 2022-05-12 ENCOUNTER — Telehealth: Payer: Self-pay | Admitting: Cardiology

## 2022-05-12 DIAGNOSIS — Z79899 Other long term (current) drug therapy: Secondary | ICD-10-CM

## 2022-05-12 NOTE — Telephone Encounter (Signed)
Follow Up:     Patient is calling to see if her blood pressure readings were received from [facility], [place]? SHe said they were supposed to have been fax here over a week and a half for the doctor to review. Patient said she have never heard anything from this office. Patient says her blood pressure is still high.    Pt c/o BP issue: STAT if pt c/o blurred vision, one-sided weakness or slurred speech  1. What are your last 5 BP readings?  This morning was 155/89- she did not  have her other readings with her, the other readings are at home  2. Are you having any other symptoms (ex. Dizziness, headache, blurred vision, passed out)?   a little headache  3. What is your BP issue? Blood pressure is running highe

## 2022-05-17 ENCOUNTER — Telehealth: Payer: Self-pay | Admitting: Medical-Surgical

## 2022-05-17 ENCOUNTER — Other Ambulatory Visit: Payer: Self-pay | Admitting: Medical-Surgical

## 2022-05-17 DIAGNOSIS — E1169 Type 2 diabetes mellitus with other specified complication: Secondary | ICD-10-CM

## 2022-05-17 DIAGNOSIS — E785 Hyperlipidemia, unspecified: Secondary | ICD-10-CM

## 2022-05-17 NOTE — Telephone Encounter (Signed)
Patient called concerned that no one has contacted her about a fax that was suppose to be sent over to Dr. Jacalyn Lefevre office she request a call back # [phone]

## 2022-05-17 NOTE — Telephone Encounter (Signed)
Duplicate

## 2022-05-19 ENCOUNTER — Encounter: Payer: Self-pay | Admitting: Cardiology

## 2022-05-19 NOTE — Telephone Encounter (Signed)
Error

## 2022-05-19 NOTE — Telephone Encounter (Signed)
The patient called in with blood pressure concerns. She had called a week ago but the message was not sent in.  She stated that over the past few weeks her blood pressures have been increasing. She stated that since she had not heard back from the office, she changed some medications herself to help control her blood pressures. She has been advised that we did not receive the message and that we were sorry for that.  Her blood pressures have been running in the 150/90's.  A few days ago she restarted her Lisinopril-HCTZ 20-12.5 mg once daily and Imdur 60 mg once daily. She has not been taking Lisinopril.   10/23 blood pressure was 117/68 and today it ws 117/71  Currently taking: Lisinopril-HCTZ 20-12.5 mg once daily Metoprolol Succinate 50 mg once daily Imdur 60 mg once daily  She would like to know if she should continue this regimen and if so, will need prescriptions sent in.

## 2022-05-19 NOTE — Telephone Encounter (Signed)
Pt is calling back in regards to BP reading she called in previously. She says she has also changed up her medications to her previous medications that had been change the last time she was seen in office and would like to discuss refills.

## 2022-05-19 NOTE — Telephone Encounter (Signed)
Patient has been having elevated BP readings at home (systolic 103-013). She stated that she has been taking the lisinopril-HCTZ 20-12.'5mg'$  to get her readings back down into normal range. Then her BP will get too low and she has to stop taking it. Today her BP was 117/71. She wants to know if there is a middle dose?   Also, she has been taking '60mg'$  isosorbide instead of '30mg'$ . Advised pt that this med was prescribed by her cardiologist and she would need to check with them about increasing the dose and if it is appropriate.   Pt also requested a refill of her rosuvastatin.

## 2022-05-20 MED ORDER — ROSUVASTATIN CALCIUM 40 MG PO TABS: ORAL_TABLET | ORAL | 1 refills | Status: DC

## 2022-05-20 NOTE — Telephone Encounter (Signed)
Left msg for a return call from patient.

## 2022-05-20 NOTE — Telephone Encounter (Signed)
She will need to contact cardiology regarding her BP management. Per our last instructions, she was to reduce her Imdur to '30mg'$  daily and hold the Lisinopril-HCTZ over the weekend to see how her BP responded. She is welcome to cut/break the Lisinopril-HCTZ in half if she wants. Her BP today looks great! Refill of Crestor sent as requested.

## 2022-05-21 MED ORDER — LISINOPRIL-HYDROCHLOROTHIAZIDE 20-12.5 MG PO TABS: 1.0000 | ORAL_TABLET | Freq: Every day | ORAL | 3 refills | Status: DC

## 2022-05-21 MED ORDER — ISOSORBIDE MONONITRATE ER 60 MG PO TB24: 60.0000 mg | ORAL_TABLET | Freq: Every day | ORAL | 3 refills | Status: DC

## 2022-05-21 MED ORDER — METOPROLOL SUCCINATE ER 50 MG PO TB24: ORAL_TABLET | ORAL | 3 refills | Status: DC

## 2022-05-21 NOTE — Telephone Encounter (Signed)
Reviewed medications the patient taking:  lisinopril '20mg'$ - HCTZ 12.'5mg'$  daily Metoprolol succinate '50mg'$  daily Imdur '60mg'$  daily Meds sent to her preferred pharmacy  Current BP 117/68.  BMET ordered. Patient verbalized understanding to get lab work next Thursday or Friday.

## 2022-05-21 NOTE — Telephone Encounter (Signed)
Pt is returning call. Requesting call back today.

## 2022-05-21 NOTE — Telephone Encounter (Signed)
Attempted to reach the patient. Was unable to leave a message

## 2022-05-24 NOTE — Telephone Encounter (Signed)
Called patient- she states that she spoke with cardiologist who had her go back to  isosobide '60mg'$  once daily, continuing with lisnopril/hctz 20/12.5 and metolprolol '50mg'$  . She states her BP readings have improved after this change.

## 2022-06-11 ENCOUNTER — Other Ambulatory Visit: Payer: Self-pay

## 2022-06-11 DIAGNOSIS — K219 Gastro-esophageal reflux disease without esophagitis: Secondary | ICD-10-CM

## 2022-06-11 MED ORDER — ESOMEPRAZOLE MAGNESIUM 20 MG PO CPDR: 20.0000 mg | DELAYED_RELEASE_CAPSULE | Freq: Every day | ORAL | 1 refills | Status: DC

## 2022-06-11 MED ORDER — METFORMIN HCL 1000 MG PO TABS: ORAL_TABLET | ORAL | 1 refills | Status: DC

## 2022-06-11 NOTE — Telephone Encounter (Signed)
Sara Gardner is requesting a refill on Nexium and Metformin. Last filled by Dr Sheppard Coil.

## 2022-06-25 ENCOUNTER — Other Ambulatory Visit: Payer: Self-pay

## 2022-06-25 DIAGNOSIS — E119 Type 2 diabetes mellitus without complications: Secondary | ICD-10-CM

## 2022-07-13 ENCOUNTER — Ambulatory Visit (INDEPENDENT_AMBULATORY_CARE_PROVIDER_SITE_OTHER): Payer: Medicare Other | Admitting: Medical-Surgical

## 2022-07-13 DIAGNOSIS — Z91199 Patient's noncompliance with other medical treatment and regimen due to unspecified reason: Secondary | ICD-10-CM

## 2022-07-13 DIAGNOSIS — E119 Type 2 diabetes mellitus without complications: Secondary | ICD-10-CM

## 2022-07-13 NOTE — Progress Notes (Signed)
   Complete physical exam  Patient: Sara Gardner   DOB: 05/15/2000   71 y.o. Female  MRN: [id number]  Subjective:    No chief complaint on file.   Sara Gardner is a 71 y.o. female who presents today for a complete physical exam. She reports consuming a {diet types:17450} diet. {types:19826} She generally feels {DESC; WELL/FAIRLY WELL/POORLY:18703}. She reports sleeping {DESC; WELL/FAIRLY WELL/POORLY:18703}. She {does/does not:200015} have additional problems to discuss today.    Most recent fall risk assessment:    01/20/2022   10:42 AM  Fall Risk   Falls in the past year? 0  Number falls in past yr: 0  Injury with Fall? 0  Risk for fall due to : No Fall Risks  Follow up Falls evaluation completed     Most recent depression screenings:    01/20/2022   10:42 AM 12/11/2020   10:46 AM  PHQ 2/9 Scores  PHQ - 2 Score 0 0  PHQ- 9 Score 5     {VISON DENTAL STD PSA (Optional):27386}  {History (Optional):23778}  Patient Care Team: Elmin Wiederholt, NP as PCP - General (Nurse Practitioner)   Outpatient Medications Prior to Visit  Medication Sig   fluticasone (FLONASE) 50 MCG/ACT nasal spray Place 2 sprays into both nostrils in the morning and at bedtime. After 7 days, reduce to once daily.   norgestimate-ethinyl estradiol (SPRINTEC 28) 0.25-35 MG-MCG tablet Take 1 tablet by mouth daily.   Nystatin POWD Apply liberally to affected area 2 times per day   spironolactone (ALDACTONE) 100 MG tablet Take 1 tablet (100 mg total) by mouth daily.   No facility-administered medications prior to visit.    ROS        Objective:     There were no vitals taken for this visit. {Vitals History (Optional):23777}  Physical Exam   No results found for any visits on 02/25/22. {Show previous labs (optional):23779}    Assessment & Plan:    Routine Health Maintenance and Physical Exam  Immunization History  Administered Date(s) Administered   DTaP 07/29/1999, 09/24/1999,  12/03/1999, 08/18/2000, 03/03/2004   Hepatitis A 12/29/2007, 01/03/2009   Hepatitis B 05/16/1999, 06/23/1999, 12/03/1999   HiB (PRP-OMP) 07/29/1999, 09/24/1999, 12/03/1999, 08/18/2000   IPV 07/29/1999, 09/24/1999, 05/23/2000, 03/03/2004   Influenza,inj,Quad PF,6+ Mos 04/05/2014   Influenza-Unspecified 07/05/2012   MMR 05/23/2001, 03/03/2004   Meningococcal Polysaccharide 01/03/2012   Pneumococcal Conjugate-13 08/18/2000   Pneumococcal-Unspecified 12/03/1999, 02/16/2000   Tdap 01/03/2012   Varicella 05/23/2000, 12/29/2007    Health Maintenance  Topic Date Due   HIV Screening  Never done   Hepatitis C Screening  Never done   INFLUENZA VACCINE  02/23/2022   PAP-Cervical Cytology Screening  02/25/2022 (Originally 05/14/2020)   PAP SMEAR-Modifier  02/25/2022 (Originally 05/14/2020)   TETANUS/TDAP  02/25/2022 (Originally 01/02/2022)   HPV VACCINES  Discontinued   COVID-19 Vaccine  Discontinued    Discussed health benefits of physical activity, and encouraged her to engage in regular exercise appropriate for her age and condition.  Problem List Items Addressed This Visit   None Visit Diagnoses     Annual physical exam    -  Primary   Cervical cancer screening       Need for Tdap vaccination          No follow-ups on file.     Bayard More, NP   

## 2022-07-27 ENCOUNTER — Telehealth: Payer: Self-pay | Admitting: Medical-Surgical

## 2022-07-27 NOTE — Telephone Encounter (Signed)
Patient dropped off Disability parking placard to be completed. Placard placed in [place] folder.

## 2022-07-28 ENCOUNTER — Other Ambulatory Visit: Payer: Self-pay

## 2022-07-28 MED ORDER — LEVOTHYROXINE SODIUM 75 MCG PO TABS: 75.0000 ug | ORAL_TABLET | Freq: Every day | ORAL | 0 refills | Status: DC

## 2022-07-28 NOTE — Telephone Encounter (Signed)
Unable to leave a message advising the patient that her disability placard is ready for pick up. Tried to call 07/27/2022 @ 4:17 PM and 07/28/2022 @ 2:52 PM. I will keep at my desk in the hold folder to keep trying to contact the patient.

## 2022-07-30 ENCOUNTER — Other Ambulatory Visit: Payer: Self-pay | Admitting: Medical-Surgical

## 2022-07-30 DIAGNOSIS — Z1231 Encounter for screening mammogram for malignant neoplasm of breast: Secondary | ICD-10-CM

## 2022-08-02 ENCOUNTER — Ambulatory Visit
Admission: RE | Admit: 2022-08-02 | Discharge: 2022-08-02 | Disposition: A | Discharge status: Home / Self Care | Payer: 59 | Source: Ambulatory Visit | Attending: Medical-Surgical | Admitting: Medical-Surgical

## 2022-08-02 DIAGNOSIS — Z1231 Encounter for screening mammogram for malignant neoplasm of breast: Secondary | ICD-10-CM

## 2022-08-04 ENCOUNTER — Other Ambulatory Visit: Payer: Self-pay | Admitting: Medical-Surgical

## 2022-08-04 DIAGNOSIS — R928 Other abnormal and inconclusive findings on diagnostic imaging of breast: Secondary | ICD-10-CM

## 2022-08-11 ENCOUNTER — Ambulatory Visit
Admission: RE | Admit: 2022-08-11 | Discharge: 2022-08-11 | Disposition: A | Discharge status: Home / Self Care | Payer: 59 | Source: Ambulatory Visit | Attending: Medical-Surgical | Admitting: Medical-Surgical

## 2022-08-11 ENCOUNTER — Ambulatory Visit: Payer: 59 | Attending: Cardiology | Admitting: Cardiology

## 2022-08-11 ENCOUNTER — Encounter: Payer: Self-pay | Admitting: Cardiology

## 2022-08-11 VITALS — BP 102/62 | HR 78 | Ht 63.0 in | Wt 172.0 lb

## 2022-08-11 DIAGNOSIS — I10 Essential (primary) hypertension: Secondary | ICD-10-CM

## 2022-08-11 DIAGNOSIS — E785 Hyperlipidemia, unspecified: Secondary | ICD-10-CM | POA: Diagnosis not present

## 2022-08-11 DIAGNOSIS — I2510 Atherosclerotic heart disease of native coronary artery without angina pectoris: Secondary | ICD-10-CM

## 2022-08-11 DIAGNOSIS — R928 Other abnormal and inconclusive findings on diagnostic imaging of breast: Secondary | ICD-10-CM

## 2022-08-11 NOTE — Patient Instructions (Signed)
  Lab Work:  Your physician recommends that you return for lab work FASTING  If you have labs (blood work) drawn today and your tests are completely normal, you will receive your results only by: [place] (if you have [place]) OR A paper copy in the mail If you have any lab test that is abnormal or we need to change your treatment, we will call you to review the results.   Follow-Up: At [facility], you and your health needs are our priority.  As part of our continuing mission to provide you with exceptional heart care, we have created designated Provider Care Teams.  These Care Teams include your primary Cardiologist (physician) and Advanced Practice Providers (APPs -  Physician Assistants and Nurse Practitioners) who all work together to provide you with the care you need, when you need it.  We recommend signing up for the patient portal called "MyChart".  Sign up information is provided on this After Visit Summary.  MyChart is used to connect with patients for Virtual Visits (Telemedicine).  Patients are able to view lab/test results, encounter notes, upcoming appointments, etc.  Non-urgent messages can be sent to your provider as well.   To learn more about what you can do with MyChart, go to [email or web address].    Your next appointment:   12 month(s)  Provider:   Kirk Ruths, MD

## 2022-08-13 ENCOUNTER — Encounter: Payer: Self-pay | Admitting: *Deleted

## 2022-08-13 LAB — COMPREHENSIVE METABOLIC PANEL
ALT: 25 IU/L (ref 0–32)
AST: 20 IU/L (ref 0–40)
Albumin/Globulin Ratio: 1.4 (ref 1.2–2.2)
Albumin: 4 g/dL (ref 3.8–4.8)
Alkaline Phosphatase: 67 IU/L (ref 44–121)
BUN/Creatinine Ratio: 14 (ref 12–28)
BUN: 17 mg/dL (ref 8–27)
Bilirubin Total: 0.3 mg/dL (ref 0.0–1.2)
CO2: 27 mmol/L (ref 20–29)
Calcium: 10 mg/dL (ref 8.7–10.3)
Chloride: 101 mmol/L (ref 96–106)
Creatinine, Ser: 1.2 mg/dL — ABNORMAL HIGH (ref 0.57–1.00)
Globulin, Total: 2.8 g/dL (ref 1.5–4.5)
Glucose: 84 mg/dL (ref 70–99)
Potassium: 4.3 mmol/L (ref 3.5–5.2)
Sodium: 144 mmol/L (ref 134–144)
Total Protein: 6.8 g/dL (ref 6.0–8.5)
eGFR: 48 mL/min/{1.73_m2} — ABNORMAL LOW (ref >59)

## 2022-08-13 LAB — LIPID PANEL
Chol/HDL Ratio: 2.2 ratio (ref 0.0–4.4)
Cholesterol, Total: 153 mg/dL (ref 100–199)
HDL: 69 mg/dL (ref >39)
LDL Chol Calc (NIH): 70 mg/dL (ref 0–99)
Triglycerides: 73 mg/dL (ref 0–149)
VLDL Cholesterol Cal: 14 mg/dL (ref 5–40)

## 2022-08-25 ENCOUNTER — Other Ambulatory Visit: Payer: Self-pay

## 2022-08-25 DIAGNOSIS — E1169 Type 2 diabetes mellitus with other specified complication: Secondary | ICD-10-CM

## 2022-08-25 NOTE — Progress Notes (Unsigned)
Patient called requesting a refill on Trulicity that was last filled 03/19/2020 by Sheppard Coil and Metoprolol that was last filled 05/21/2022 by [facility].

## 2022-08-31 ENCOUNTER — Encounter: Payer: Self-pay | Admitting: Medical-Surgical

## 2022-08-31 ENCOUNTER — Ambulatory Visit (INDEPENDENT_AMBULATORY_CARE_PROVIDER_SITE_OTHER): Payer: 59 | Admitting: Medical-Surgical

## 2022-08-31 ENCOUNTER — Ambulatory Visit (INDEPENDENT_AMBULATORY_CARE_PROVIDER_SITE_OTHER): Payer: 59

## 2022-08-31 VITALS — BP 98/64 | HR 75 | Resp 20 | Ht 63.0 in | Wt 171.6 lb

## 2022-08-31 DIAGNOSIS — E1169 Type 2 diabetes mellitus with other specified complication: Secondary | ICD-10-CM

## 2022-08-31 DIAGNOSIS — R1013 Epigastric pain: Secondary | ICD-10-CM

## 2022-08-31 DIAGNOSIS — L989 Disorder of the skin and subcutaneous tissue, unspecified: Secondary | ICD-10-CM

## 2022-08-31 DIAGNOSIS — N644 Mastodynia: Secondary | ICD-10-CM

## 2022-08-31 DIAGNOSIS — M1712 Unilateral primary osteoarthritis, left knee: Secondary | ICD-10-CM

## 2022-08-31 DIAGNOSIS — N3281 Overactive bladder: Secondary | ICD-10-CM

## 2022-08-31 DIAGNOSIS — N1831 Chronic kidney disease, stage 3a: Secondary | ICD-10-CM

## 2022-08-31 DIAGNOSIS — E785 Hyperlipidemia, unspecified: Secondary | ICD-10-CM

## 2022-08-31 LAB — POCT URINALYSIS DIP (CLINITEK)
Bilirubin, UA: NEGATIVE
Glucose, UA: NEGATIVE mg/dL
Ketones, POC UA: NEGATIVE mg/dL
Leukocytes, UA: NEGATIVE
Nitrite, UA: NEGATIVE
POC PROTEIN,UA: 30 — AB
Spec Grav, UA: 1.015 (ref 1.010–1.025)
Urobilinogen, UA: 0.2 E.U./dL
pH, UA: 5.5 (ref 5.0–8.0)

## 2022-08-31 LAB — POCT GLYCOSYLATED HEMOGLOBIN (HGB A1C): Hemoglobin A1C: 7.5 % — AB (ref 4.0–5.6)

## 2022-08-31 LAB — POCT UA - MICROALBUMIN
Albumin/Creatinine Ratio, Urine, POC: >300
Creatinine, POC: 50 mg/dL
Microalbumin Ur, POC: 150 mg/L

## 2022-08-31 MED ORDER — TRULICITY 1.5 MG/0.5ML ~~LOC~~ SOAJ: 1.5000 mg | SUBCUTANEOUS | 5 refills | Status: DC

## 2022-08-31 NOTE — Progress Notes (Signed)
Attempted to contact the patient regarding provider's note. No answer, left a brief message requesting for patient to return a call back to the clinic. Direct call back info provided.

## 2022-08-31 NOTE — Progress Notes (Signed)
Established Patient Office Visit  Subjective   Patient ID: Sara Gardner, female   DOB: 06-20-1951 Age: 72 y.o. MRN: [id number]   No chief complaint on file.   HPI Pleasant 72 year old female presenting today for the following:  Diabetes: She has been using Trulicity 1.5 mg weekly as prescribed, tolerating well without side effects.  Unfortunately, she ran out of of the medication approximately 1 month ago.  She reports that she put in a request for it but has not received a new order at the pharmacy.  She is followed by endocrinology but it appears they only monitor for her thyroid issues.  Occasionally checks her sugar but not on a daily basis.  Taking metformin 1000 mg daily.  Has had an increase in thirst so has reduced her water intake to combat this.  Left knee pain: Known left knee arthritis with recent flare.  Notes that her knee swells at times and is very painful.  She has had a worsening of her symptoms lately and would like further evaluation.  Has been using a knee brace as well as Voltaren topically with minimal to moderate relief of symptoms.  Has a growth on her right thigh that has been present for a number of years.  It was frozen off by previous provider however it has since grown back.  It is not painful or irritating but is cosmetically bothersome.  She would like to have this refrozen today.  Reports that she has recently developed issues with urinary incontinence first thing in the morning.  When she wakes, she has a strong urge to urinate and is unable to make it to the bathroom before having some leakage of the urine.    Objective:    Vitals:   08/31/22 0817  BP: 98/64  Pulse: 75  Resp: 20  Height: '5\' 3"'$  (1.6 m)  Weight: 171 lb 9.6 oz (77.8 kg)  SpO2: 97%  BMI (Calculated): 30.41    Physical Exam Vitals reviewed.  Constitutional:      General: She is not in acute distress.    Appearance: Normal appearance. She is not ill-appearing.  HENT:      Head: Normocephalic and atraumatic.  Cardiovascular:     Rate and Rhythm: Normal rate and regular rhythm.     Pulses: Normal pulses.     Heart sounds: Normal heart sounds.  Pulmonary:     Effort: Pulmonary effort is normal. No respiratory distress.     Breath sounds: Normal breath sounds. No wheezing, rhonchi or rales.  Skin:    General: Skin is warm and dry.          Comments: Large pedunculated skin tag with broad base.   Neurological:     Mental Status: She is alert and oriented to person, place, and time.  Psychiatric:        Mood and Affect: Mood normal.        Behavior: Behavior normal.        Thought Content: Thought content normal.        Judgment: Judgment normal.    Results for orders placed or performed in visit on 08/31/22 (from the past 24 hour(s))  POCT HgB A1C     Status: Abnormal   Collection Time: 08/31/22 10:39 AM  Result Value Ref Range   Hemoglobin A1C 7.5 (A) 4.0 - 5.6 %   HbA1c POC (<> result, manual entry)     HbA1c, POC (prediabetic range)     HbA1c, POC (  controlled diabetic range)    POCT UA - Microalbumin     Status: Abnormal   Collection Time: 08/31/22 10:40 AM  Result Value Ref Range   Microalbumin Ur, POC 150 mg/L   Creatinine, POC 50 mg/dL   Albumin/Creatinine Ratio, Urine, POC >300   POCT URINALYSIS DIP (CLINITEK)     Status: Abnormal   Collection Time: 08/31/22 10:44 AM  Result Value Ref Range   Color, UA yellow yellow   Clarity, UA clear clear   Glucose, UA negative negative mg/dL   Bilirubin, UA negative negative   Ketones, POC UA negative negative mg/dL   Spec Grav, UA 1.015 1.010 - 1.025   Blood, UA trace-lysed (A) negative   pH, UA 5.5 5.0 - 8.0   POC PROTEIN,UA =30 (A) negative, trace   Urobilinogen, UA 0.2 0.2 or 1.0 E.U./dL   Nitrite, UA Negative Negative   Leukocytes, UA Negative Negative       The ASCVD Risk score (Arnett DK, et al., 2019) failed to calculate for the following reasons:   The patient has a prior MI or  stroke diagnosis   Assessment & Plan:   1. Epigastric pain Etiology.  Consider GERD, PUD, gastroparesis, or or gas pains.  Getting KUB for further evaluation. - DG Abd 1 View; Future  2. Dyslipidemia associated with type 2 diabetes mellitus (HCC) POCT hemoglobin A1c 7.5% today.  This is likely reflective of being off Trulicity for the last month.  Refilling Trulicity 1.5 mg weekly today.  Continue metformin 1000 mg daily.  POCT microalbumin showing high abnormal. Plan to resume Trulicity and recheck Microalbumin in 4 weeks. If not improving, consider starting Farxiga.  - Dulaglutide (TRULICITY) 1.5 FB/5.1WC SOPN; Inject 1.5 mg into the skin once a week.  Dispense: 6 mL; Refill: 5 - POCT HgB A1C  3. Arthritis of left knee Getting x-rays of left knee today.  Continue Voltaren and bracing.  Consider other topical analgesics like lidocaine patches, icing, heat, Biofreeze, etc. - DG Knee Complete 4 Views Left; Future  4. Overactive bladder POCT urinalysis is normal.  Feel it is likely that her new onset a.m. incontinence is related to the increase in water consumption throughout the night.  Once we get her diabetes back under control - POCT Urinalysis Dipstick  5. Skin lesion Large skin tag to the proximal inner right thigh. Consider cryotherapy vs surgical removal. She would like to proceed with cryotherapy then if needed, have it removed surgically.  Procedure: Cryodestruction of: right thigh skin tag Consent obtained and verified. Time-out conducted. Noted no overlying erythema, induration, or other signs of local infection. Completed without difficulty using Cryo-Gun. Advised to call if fevers/chills, erythema, induration, drainage, or persistent bleeding.  6. Breast pain, left Recent mammogram with no concerns identified. No recurrence of the sharp pain since it happened last week. Recommend monitoring over the next couple of weeks and if it continues or worsens, can recheck with  ultrasound. Discussed things that can affect breast tissue.   7. Stage 3a chronic kidney disease (HCC) Recent kidney function showing stage 3 CKD. Discussed current stage and ways to avoid further kidney injury.   Return for 4 weeks for recheck of Urine Microalbumin; 3 months for DM follow up.  ___________________________________________ Clearnce Sorrel, DNP, APRN, FNP-BC Primary Care and [place]

## 2022-09-01 LAB — URINE CULTURE
MICRO NUMBER:: 14525834
Result:: NO GROWTH
SPECIMEN QUALITY:: ADEQUATE

## 2022-09-03 ENCOUNTER — Ambulatory Visit (INDEPENDENT_AMBULATORY_CARE_PROVIDER_SITE_OTHER): Payer: 59 | Admitting: Sports Medicine

## 2022-09-03 DIAGNOSIS — M1712 Unilateral primary osteoarthritis, left knee: Secondary | ICD-10-CM

## 2022-09-03 NOTE — Progress Notes (Signed)
    Procedures performed today:    None.  Independent interpretation of notes and tests performed by another provider:   None.  Brief History, Exam, Impression, and Recommendations:    Primary osteoarthritis of left knee Classic knee osteoarthritis, topical Voltaren is not effective. X-rays confirm osteoarthritis. She is not interested in any pills, we will add aggressive formal physical therapy followed by injection if not better.    ____________________________________________ Gwen Her. Dianah Field, M.D., ABFM., CAQSM., AME. Primary Care and Sports Medicine [facility]  Adjunct Professor of [employer]  Risk manager

## 2022-09-03 NOTE — Assessment & Plan Note (Signed)
Classic knee osteoarthritis, topical Voltaren is not effective. X-rays confirm osteoarthritis. She is not interested in any pills, we will add aggressive formal physical therapy followed by injection if not better.

## 2022-09-09 NOTE — Progress Notes (Signed)
09/09/22 2:30 pm Left message for a return call.

## 2022-09-10 ENCOUNTER — Other Ambulatory Visit: Payer: Self-pay

## 2022-09-10 ENCOUNTER — Ambulatory Visit: Payer: 59 | Attending: Sports Medicine | Admitting: Physical Therapy

## 2022-09-10 DIAGNOSIS — R201 Hypoesthesia of skin: Secondary | ICD-10-CM | POA: Diagnosis present

## 2022-09-10 DIAGNOSIS — G8929 Other chronic pain: Secondary | ICD-10-CM | POA: Insufficient documentation

## 2022-09-10 DIAGNOSIS — R293 Abnormal posture: Secondary | ICD-10-CM | POA: Insufficient documentation

## 2022-09-10 DIAGNOSIS — M25562 Pain in left knee: Secondary | ICD-10-CM | POA: Insufficient documentation

## 2022-09-10 DIAGNOSIS — M1712 Unilateral primary osteoarthritis, left knee: Secondary | ICD-10-CM | POA: Diagnosis not present

## 2022-09-10 DIAGNOSIS — M6281 Muscle weakness (generalized): Secondary | ICD-10-CM | POA: Diagnosis present

## 2022-09-10 NOTE — Therapy (Signed)
OUTPATIENT PHYSICAL THERAPY LOWER EXTREMITY EVALUATION   Patient Name: Sara Gardner MRN: [id number] DOB:Mar 03, 1951, 72 y.o., female Today's Date: 09/10/2022  END OF SESSION:  PT End of Session - 09/10/22 0843     Visit Number 1    Number of Visits 12    Date for PT Re-Evaluation 10/22/22    Authorization Type U[employer] Medicare    Authorization - Visit Number 1    Progress Note Due on Visit 10    PT Start Time 0805    PT Stop Time [id number]    PT Time Calculation (min) 30 min    Activity Tolerance Patient tolerated treatment well    Behavior During Therapy [facility] for tasks assessed/performed             Past Medical History:  Diagnosis Date   Aortic atherosclerosis ([place]) 06/10/2017   CAD (coronary artery disease)    Cancer ([place])    Combined form of age-related cataract, both eyes 04/07/2017   Dermatochalasis of both eyelids 04/07/2017   Diabetes mellitus without complication (HCC)    GERD (gastroesophageal reflux disease)    Hypertension    Hypothyroidism    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   Left renal mass 10/04/2018   1.7 cm hypoechoic mass 10/04/2018   Lumbar degenerative disc disease 03/09/2017   Myocardial infarction ([place]) 2005   Normocytic anemia 12/14/2018   Palpitations    Papillary thyroid carcinoma ([place])    Posterior vitreous detachment, right eye 04/07/2017   Past Surgical History:  Procedure Laterality Date   TOTAL THYROIDECTOMY  2016   Patient Active Problem List   Diagnosis Date Noted   Hypotension 05/26/2021   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/05/2019   Cervicalgia 04/05/2019   Eustachian tube dysfunction, bilateral 04/05/2019   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   White matter abnormality on MRI of brain 04/05/2019   Myalgia due to statin 12/14/2018   Normocytic anemia 12/14/2018   At moderate risk for fall 10/03/2018   Bilateral renal cysts 10/03/2018   Facet arthropathy, lumbosacral 10/03/2018   Lacunar infarction ([place])  07/20/2018   Controlled type 2 diabetes mellitus without complication, without long-term current use of insulin ([place]) 03/08/2018   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/23/2017   Overactive bladder 08/17/2017   Equinus contracture of ankle 07/27/2017   Hammer toes of both feet 07/27/2017   Dermatophytosis, nail 07/27/2017   Calcaneal spur of both feet 07/27/2017   Aortic atherosclerosis ([place]) 06/10/2017   Vitreous detachment of right eye 05/12/2017   Combined form of age-related cataract, both eyes 04/07/2017   Dermatochalasis of both eyelids 04/07/2017   Posterior vitreous detachment, right eye 04/07/2017   Postural kyphosis of cervicothoracic region 03/09/2017   Lumbar degenerative disc disease 03/09/2017   Primary osteoarthritis of left knee 03/04/2017   Bilateral primary osteoarthritis of knee 12/15/2016   Allergy to honey bee venom 12/15/2016   Hypertension 02/16/2016   DM type 2 with diabetic dyslipidemia ([place]) 02/10/2016   Gastroesophageal reflux disease without esophagitis 02/10/2016   Hypothyroidism, 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   Diabetic sensorimotor polyneuropathy ([place]) 10/27/2015   Papillary thyroid carcinoma ([place]) 08/09/2014   Post-menopausal atrophic vaginitis 06/24/2014   Left thyroid nodule 01/29/2014   Nodule of left lung 01/21/2014   Class 1 obesity due to excess calories with serious comorbidity and body mass index (BMI) of 30.0 to 30.9 in adult 06/12/2012   Palpitations 11/29/2011   Abnormal mammogram of right breast 08/30/2011   Arthritis of left knee 05/24/2011   CAD (coronary  artery disease) 05/24/2011   History of acute myocardial infarction of inferior wall 05/24/2011    PCP: Samuel Bouche  REFERRING PROVIDER: Dianah Field  REFERRING DIAG: Primary OA of Lt kne  THERAPY DIAG:  Chronic pain of left knee  Muscle weakness (generalized)  Rationale for Evaluation and Treatment: Rehabilitation  ONSET DATE: 06/2022  SUBJECTIVE:   SUBJECTIVE  STATEMENT: Pt has had long standing knee pain but recently with the colder weather the knee pain has gotten worse. She states both knees hurt but her Lt knee is worse. Pain increases with stair climbing and walking and wearing high heels, eases with rest. MD offered steroid injections but pt is hoping to avoid that and wants to try PT.  PERTINENT HISTORY: Previous PT for cervical pain PAIN:  Are you having pain? Yes: NPRS scale: 7/10 Pain location: Lt knee Pain description: sore, ache Aggravating factors: stairs, walking, high heels Relieving factors: rest  PRECAUTIONS: None  WEIGHT BEARING RESTRICTIONS: No  FALLS:  Has patient fallen in last 6 months? No   OCCUPATION: customer service  PLOF: Independent  PATIENT GOALS: decrease pain  NEXT MD VISIT: 09/2022  OBJECTIVE:   DIAGNOSTIC FINDINGS: x ray: Tricompartmental degenerative changes with partial joint space loss laterally. Enthesopathic changes off the superior patella.   EDEMA:  Mild edema Lt knee inferior to patella   PALPATION: TTP with inferior patellar mobs  LOWER EXTREMITY ROM:  Active ROM Right eval Left eval  Hip flexion 4- 3  Hip extension    Hip abduction 4- 3  Hip adduction    Hip internal rotation    Hip external rotation    Knee flexion 115 119  Knee extension 0 0  Ankle dorsiflexion    Ankle plantarflexion    Ankle inversion    Ankle eversion     (Blank rows = not tested)  LOWER EXTREMITY MMT:  MMT Right eval Left eval  Hip flexion  3  Hip extension    Hip abduction  3  Hip adduction    Hip internal rotation    Hip external rotation    Knee flexion 4 4  Knee extension 3+ 3+  Ankle dorsiflexion    Ankle plantarflexion    Ankle inversion    Ankle eversion     (Blank rows = not tested)   FUNCTIONAL TESTS:  SLS: Rt 10 seconds, Lt 3 sec   TODAY'S TREATMENT:                                                                                                                               DATE: 09/10/22 See HEP    PATIENT EDUCATION:  Education details: PT POC and goals, HEP Person educated: Patient Education method: Explanation, Demonstration, and Handouts Education comprehension: verbalized understanding and returned demonstration  HOME EXERCISE PROGRAM: Access Code: [facility] URL: [email or web address] Date: 09/10/2022 Prepared by: Isabelle Course  Exercises - Hip Abduction with Resistance Loop  - 1 x daily -  7 x weekly - 3 sets - 10 reps - Hip Extension with Resistance Loop  - 1 x daily - 7 x weekly - 3 sets - 10 reps - Marching with Resistance  - 1 x daily - 7 x weekly - 3 sets - 10 reps - Standing Heel Raise with Support  - 1 x daily - 7 x weekly - 3 sets - 10 reps  ASSESSMENT:  CLINICAL IMPRESSION: Patient is a 72 y.o. female who was seen today for physical therapy evaluation and treatment for OA of Lt knee. Pt presents with decreased strength and ROM, impaired gait and balance and decreased functional activity tolerance and increased pain. Pt will benefit from skilled PT to address deficits and improve functional pain free mobility.   OBJECTIVE IMPAIRMENTS: decreased activity tolerance, difficulty walking, decreased ROM, decreased strength, and pain.   ACTIVITY LIMITATIONS: standing, stairs, and locomotion level  PARTICIPATION LIMITATIONS: meal prep, cleaning, community activity, and occupation  PERSONAL FACTORS: Age and Time since onset of injury/illness/exacerbation are also affecting patient's functional outcome.   REHAB POTENTIAL: Good  CLINICAL DECISION MAKING: Stable/uncomplicated  EVALUATION COMPLEXITY: Low   GOALS: Goals reviewed with patient? Yes  SHORT TERM GOALS: Target date: 09/24/2022   Pt will be independent with initial HEP Baseline: Goal status: INITIAL   LONG TERM GOALS: Target date: 10/22/2022    Pt will be independent with advanced HEP Baseline:  Goal status: INITIAL  2.  Pt will improve Lt knee  and hip strength to 4+/5 to improve stairs and walking with decreased pain Baseline:  Goal status: INITIAL  3.  Pt will improve SLS to >10 seconds on Lt to demo improved balance Baseline:  Goal status: INITIAL  4.  Pt will tolerate walking x 10 minutes with pain <= 3/10 Baseline:  Goal status: INITIAL    PLAN:  PT FREQUENCY: 2x/week  PT DURATION: 6 weeks  PLANNED INTERVENTIONS: Therapeutic exercises, Therapeutic activity, Neuromuscular re-education, Balance training, Gait training, Patient/Family education, Self Care, Joint mobilization, Stair training, Aquatic Therapy, Dry Needling, Electrical stimulation, Cryotherapy, Moist heat, Taping, Vasopneumatic device, Ultrasound, Ionotophoresis 46m/ml Dexamethasone, Manual therapy, and Re-evaluation  PLAN FOR NEXT SESSION: Lt knee strength, balance   Daleigh Pollinger, PT 09/10/2022, 8:44 AM

## 2022-09-13 ENCOUNTER — Telehealth: Payer: Self-pay | Admitting: Medical-Surgical

## 2022-09-13 ENCOUNTER — Ambulatory Visit: Payer: 59

## 2022-09-13 DIAGNOSIS — M6281 Muscle weakness (generalized): Secondary | ICD-10-CM

## 2022-09-13 DIAGNOSIS — R201 Hypoesthesia of skin: Secondary | ICD-10-CM

## 2022-09-13 DIAGNOSIS — G8929 Other chronic pain: Secondary | ICD-10-CM

## 2022-09-13 DIAGNOSIS — M25562 Pain in left knee: Secondary | ICD-10-CM | POA: Diagnosis not present

## 2022-09-13 DIAGNOSIS — R293 Abnormal posture: Secondary | ICD-10-CM

## 2022-09-13 NOTE — Therapy (Signed)
OUTPATIENT PHYSICAL THERAPY LOWER EXTREMITY TREATMENT   Patient Name: Sara Gardner MRN: [id number] DOB:1950/11/24, 72 y.o., female Today's Date: 09/13/2022  END OF SESSION:  PT End of Session - 09/13/22 1150     Visit Number 2    Number of Visits 12    Date for PT Re-Evaluation 10/22/22    Authorization Type [employer] Medicare    Progress Note Due on Visit 10    PT Start Time 1150    PT Stop Time 1234    PT Time Calculation (min) 44 min    Activity Tolerance Patient tolerated treatment well    Behavior During Therapy WFL for tasks assessed/performed             Past Medical History:  Diagnosis Date   Aortic atherosclerosis ([place]) 06/10/2017   CAD (coronary artery disease)    Cancer ([place])    Combined form of age-related cataract, both eyes 04/07/2017   Dermatochalasis of both eyelids 04/07/2017   Diabetes mellitus without complication (HCC)    GERD (gastroesophageal reflux disease)    Hypertension    Hypothyroidism    Lacunar infarction ([place]) 07/20/2018   Left renal mass 10/04/2018   1.7 cm hypoechoic mass 10/04/2018   Lumbar degenerative disc disease 03/09/2017   Myocardial infarction ([place]) 2005   Normocytic anemia 12/14/2018   Palpitations    Papillary thyroid carcinoma ([place])    Posterior vitreous detachment, right eye 04/07/2017   Past Surgical History:  Procedure Laterality Date   TOTAL THYROIDECTOMY  2016   Patient Active Problem List   Diagnosis Date Noted   Hypotension 05/26/2021   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/05/2019   Cervicalgia 04/05/2019   Eustachian tube dysfunction, bilateral 04/05/2019   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[place]) 04/05/2019   White matter abnormality on MRI of brain 04/05/2019   Myalgia due to statin 12/14/2018   Normocytic anemia 12/14/2018   At moderate risk for fall 10/03/2018   Bilateral renal cysts 10/03/2018   Facet arthropathy, lumbosacral 10/03/2018   Lacunar infarction ([place]) 07/20/2018   Controlled type 2 diabetes  mellitus without complication, without long-term current use of insulin ([place]) 03/08/2018   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/23/2017   Overactive bladder 08/17/2017   Equinus contracture of ankle 07/27/2017   Hammer toes of both feet 07/27/2017   Dermatophytosis, nail 07/27/2017   Calcaneal spur of both feet 07/27/2017   Aortic atherosclerosis ([place]) 06/10/2017   Vitreous detachment of right eye 05/12/2017   Combined form of age-related cataract, both eyes 04/07/2017   Dermatochalasis of both eyelids 04/07/2017   Posterior vitreous detachment, right eye 04/07/2017   Postural kyphosis of cervicothoracic region 03/09/2017   Lumbar degenerative disc disease 03/09/2017   Primary osteoarthritis of left knee 03/04/2017   Bilateral primary osteoarthritis of knee 12/15/2016   Allergy to honey bee venom 12/15/2016   Hypertension 02/16/2016   DM type 2 with diabetic dyslipidemia ([place]) 02/10/2016   Gastroesophageal reflux disease without esophagitis 02/10/2016   Hypothyroidism, 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   Diabetic sensorimotor polyneuropathy ([place]) 10/27/2015   Papillary thyroid carcinoma ([place]) 08/09/2014   Post-menopausal atrophic vaginitis 06/24/2014   Left thyroid nodule 01/29/2014   Nodule of left lung 01/21/2014   Class 1 obesity due to excess calories with serious comorbidity and body mass index (BMI) of 30.0 to 30.9 in adult 06/12/2012   Palpitations 11/29/2011   Abnormal mammogram of right breast 08/30/2011   Arthritis of left knee 05/24/2011   CAD (coronary artery disease) 05/24/2011   History of acute  myocardial infarction of inferior wall 05/24/2011    PCP: Samuel Bouche  REFERRING PROVIDER: Dianah Field  REFERRING DIAG: Primary OA of Lt kne  THERAPY DIAG:  Chronic pain of left knee  Muscle weakness (generalized)  Abnormal posture  Impaired sensation  Rationale for Evaluation and Treatment: Rehabilitation  ONSET DATE: 06/2022  SUBJECTIVE:    SUBJECTIVE STATEMENT: Patient reports 7/10 pain in L knee today, reports no changes in symptoms since evaluation.   PERTINENT HISTORY: Previous PT for cervical pain PAIN:  Are you having pain? Yes: NPRS scale: 7/10 Pain location: Lt knee Pain description: sore, ache Aggravating factors: stairs, walking, high heels Relieving factors: rest  PRECAUTIONS: None  WEIGHT BEARING RESTRICTIONS: No  FALLS:  Has patient fallen in last 6 months? No   OCCUPATION: customer service  PLOF: Independent  PATIENT GOALS: decrease pain  NEXT MD VISIT: 09/2022  OBJECTIVE:   DIAGNOSTIC FINDINGS: x ray: Tricompartmental degenerative changes with partial joint space loss laterally. Enthesopathic changes off the superior patella.   EDEMA:  Mild edema Lt knee inferior to patella   PALPATION: TTP with inferior patellar mobs  LOWER EXTREMITY ROM:  Active ROM Right eval Left eval  Hip flexion 4- 3  Hip extension    Hip abduction 4- 3  Hip adduction    Hip internal rotation    Hip external rotation    Knee flexion 115 119  Knee extension 0 0  Ankle dorsiflexion    Ankle plantarflexion    Ankle inversion    Ankle eversion     (Blank rows = not tested)  LOWER EXTREMITY MMT:  MMT Right eval Left eval  Hip flexion  3  Hip extension    Hip abduction  3  Hip adduction    Hip internal rotation    Hip external rotation    Knee flexion 4 4  Knee extension 3+ 3+  Ankle dorsiflexion    Ankle plantarflexion    Ankle inversion    Ankle eversion     (Blank rows = not tested)   FUNCTIONAL TESTS:  SLS: Rt 10 seconds, Lt 3 sec   TODAY'S TREATMENT:   [facility] Adult PT Treatment:                                                DATE: 09/13/2022 Therapeutic Exercise: Recumbent bike L1 warm up x 70mn Mat table: L small ROM SLR x10 S/L clamshells RTB --< bent knee hip abd RTB x10 B S/L pilates side kicks (side, fwd/bkwd) Seated knee extension GBT abound ankle Standing: Resisted  hip abd & ext RTB x10 each B Resisted hip flexion YTB x10 (L) Front step up 6" step L SLS Bkwd step down (eccentric focus) 4" step L SLS Lateral heel tap 4" step Woodpecker (L)  TKE with ball behind knee  DATE: 09/10/22 See HEP    PATIENT EDUCATION:  Education details: Updated HEP Person educated: Patient Education method: Explanation, Demonstration, and Handouts Education comprehension: verbalized understanding and returned demonstration  HOME EXERCISE PROGRAM: Access Code: [facility] URL: [email or web address] Date: 09/13/2022 Prepared by: Helane Gunther  Exercises - Hip Abduction with Resistance Loop  - 1 x daily - 7 x weekly - 3 sets - 10 reps - Hip Extension with Resistance Loop  - 1 x daily - 7 x weekly - 3 sets - 10 reps - Marching with Resistance  - 1 x daily - 7 x weekly - 3 sets - 10 reps - Standing Heel Raise with Support  - 1 x daily - 7 x weekly - 3 sets - 10 reps - Sidelying Hip Abduction  - 1 x daily - 7 x weekly - 3 sets - 10 reps - Clam with Resistance  - 1 x daily - 7 x weekly - 3 sets - 10 reps - Supine Bridge  - 1 x daily - 7 x weekly - 3 sets - 10 reps - Seated Knee Extension with Resistance  - 1 x daily - 7 x weekly - 3 sets - 10 reps - Standing Terminal Knee Extension at Wall with Ball  - 1 x daily - 7 x weekly - 3 sets - 10 reps - 5 sec hold  ASSESSMENT:  CLINICAL IMPRESSION: Quad and hip strengthening exercises progressed; tactile cues improved pelvic stability during side lying hip abd exercises. Verbal cues improved dynamic knee flexion during eccentric phase of step up; noted knee locking in L single leg stance.    OBJECTIVE IMPAIRMENTS: decreased activity tolerance, difficulty walking, decreased ROM, decreased strength, and pain.   ACTIVITY LIMITATIONS: standing, stairs, and locomotion level  PARTICIPATION  LIMITATIONS: meal prep, cleaning, community activity, and occupation  PERSONAL FACTORS: Age and Time since onset of injury/illness/exacerbation are also affecting patient's functional outcome.   REHAB POTENTIAL: Good  CLINICAL DECISION MAKING: Stable/uncomplicated  EVALUATION COMPLEXITY: Low   GOALS: Goals reviewed with patient? Yes  SHORT TERM GOALS: Target date: 09/24/2022   Pt will be independent with initial HEP Baseline: Goal status: INITIAL   LONG TERM GOALS: Target date: 10/22/2022    Pt will be independent with advanced HEP Baseline:  Goal status: INITIAL  2.  Pt will improve Lt knee and hip strength to 4+/5 to improve stairs and walking with decreased pain Baseline:  Goal status: INITIAL  3.  Pt will improve SLS to >10 seconds on Lt to demo improved balance Baseline:  Goal status: INITIAL  4.  Pt will tolerate walking x 10 minutes with pain <= 3/10 Baseline:  Goal status: INITIAL    PLAN:  PT FREQUENCY: 2x/week  PT DURATION: 6 weeks  PLANNED INTERVENTIONS: Therapeutic exercises, Therapeutic activity, Neuromuscular re-education, Balance training, Gait training, Patient/Family education, Self Care, Joint mobilization, Stair training, Aquatic Therapy, Dry Needling, Electrical stimulation, Cryotherapy, Moist heat, Taping, Vasopneumatic device, Ultrasound, Ionotophoresis 49m/ml Dexamethasone, Manual therapy, and Re-evaluation  PLAN FOR NEXT SESSION: Lt knee strength, balance, quad strengthening   KHardin Negus PTA 09/13/2022, 12:35 PM

## 2022-09-13 NOTE — Telephone Encounter (Signed)
Called patient to schedule Medicare Annual Wellness Visit (AWV). Left message for patient to call back and schedule Medicare Annual Wellness Visit (AWV).  Last date of AWV: Never  Please schedule an appointment at any time with Samuel Bouche or Nurse Health Advisor.  If any questions, please contact me at [phone].  Thank you ,  Lin Givens Patient Access Advocate II Direct Dial: [phone]

## 2022-09-15 ENCOUNTER — Encounter: Payer: Self-pay | Admitting: Physical Therapy

## 2022-09-15 ENCOUNTER — Ambulatory Visit: Payer: 59 | Admitting: Physical Therapy

## 2022-09-15 DIAGNOSIS — M25562 Pain in left knee: Secondary | ICD-10-CM | POA: Diagnosis not present

## 2022-09-15 DIAGNOSIS — M6281 Muscle weakness (generalized): Secondary | ICD-10-CM

## 2022-09-15 DIAGNOSIS — G8929 Other chronic pain: Secondary | ICD-10-CM

## 2022-09-15 NOTE — Therapy (Signed)
OUTPATIENT PHYSICAL THERAPY LOWER EXTREMITY TREATMENT   Patient Name: Sara Gardner MRN: [id number] DOB:04/19/1951, 72 y.o., female Today's Date: 09/15/2022  END OF SESSION:  PT End of Session - 09/15/22 1531     Visit Number 3    Number of Visits 12    Date for PT Re-Evaluation 10/22/22    Authorization Type UHC Medicare    Authorization - Visit Number 3    Progress Note Due on Visit 10    PT Start Time 1500    PT Stop Time 1530    PT Time Calculation (min) 30 min    Activity Tolerance Patient tolerated treatment well    Behavior During Therapy WFL for tasks assessed/performed              Past Medical History:  Diagnosis Date   Aortic atherosclerosis ([place]) 06/10/2017   CAD (coronary artery disease)    Cancer ([place])    Combined form of age-related cataract, both eyes 04/07/2017   Dermatochalasis of both eyelids 04/07/2017   Diabetes mellitus without complication (HCC)    GERD (gastroesophageal reflux disease)    Hypertension    Hypothyroidism    Lacunar infarction ([place]) 07/20/2018   Left renal mass 10/04/2018   1.7 cm hypoechoic mass 10/04/2018   Lumbar degenerative disc disease 03/09/2017   Myocardial infarction ([place]) 2005   Normocytic anemia 12/14/2018   Palpitations    Papillary thyroid carcinoma ([place])    Posterior vitreous detachment, right eye 04/07/2017   Past Surgical History:  Procedure Laterality Date   TOTAL THYROIDECTOMY  2016   Patient Active Problem List   Diagnosis Date Noted   Hypotension 05/26/2021   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/05/2019   Cervicalgia 04/05/2019   Eustachian tube dysfunction, bilateral 04/05/2019   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   White matter abnormality on MRI of brain 04/05/2019   Myalgia due to statin 12/14/2018   Normocytic anemia 12/14/2018   At moderate risk for fall 10/03/2018   Bilateral renal cysts 10/03/2018   Facet arthropathy, lumbosacral 10/03/2018   Lacunar infarction ([place])  07/20/2018   Controlled type 2 diabetes mellitus without complication, without long-term current use of insulin ([place]) 03/08/2018   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/23/2017   Overactive bladder 08/17/2017   Equinus contracture of ankle 07/27/2017   Hammer toes of both feet 07/27/2017   Dermatophytosis, nail 07/27/2017   Calcaneal spur of both feet 07/27/2017   Aortic atherosclerosis ([place]) 06/10/2017   Vitreous detachment of right eye 05/12/2017   Combined form of age-related cataract, both eyes 04/07/2017   Dermatochalasis of both eyelids 04/07/2017   Posterior vitreous detachment, right eye 04/07/2017   Postural kyphosis of cervicothoracic region 03/09/2017   Lumbar degenerative disc disease 03/09/2017   Primary osteoarthritis of left knee 03/04/2017   Bilateral primary osteoarthritis of knee 12/15/2016   Allergy to honey bee venom 12/15/2016   Hypertension 02/16/2016   DM type 2 with diabetic dyslipidemia ([place]) 02/10/2016   Gastroesophageal reflux disease without esophagitis 02/10/2016   Hypothyroidism, 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   Diabetic sensorimotor polyneuropathy ([place]) 10/27/2015   Papillary thyroid carcinoma ([place]) 08/09/2014   Post-menopausal atrophic vaginitis 06/24/2014   Left thyroid nodule 01/29/2014   Nodule of left lung 01/21/2014   Class 1 obesity due to excess calories with serious comorbidity and body mass index (BMI) of 30.0 to 30.9 in adult 06/12/2012   Palpitations 11/29/2011   Abnormal mammogram of right breast 08/30/2011   Arthritis of left knee 05/24/2011   CAD (  coronary artery disease) 05/24/2011   History of acute myocardial infarction of inferior wall 05/24/2011    PCP: Samuel Bouche  REFERRING PROVIDER: Dianah Field  REFERRING DIAG: Primary OA of Lt kne  THERAPY DIAG:  Chronic pain of left knee  Muscle weakness (generalized)  Rationale for Evaluation and Treatment: Rehabilitation  ONSET DATE: 06/2022  SUBJECTIVE:   SUBJECTIVE  STATEMENT: Patient reports increased knee pain with prolonged knee flexion, feels better when she exercises at home. She states she has more knee pain today due to driving a lot today   PERTINENT HISTORY: Previous PT for cervical pain PAIN:  Are you having pain? Yes: NPRS scale: 8/10 Pain location: Lt knee Pain description: sore, ache Aggravating factors: stairs, walking, high heels Relieving factors: rest  PRECAUTIONS: None  WEIGHT BEARING RESTRICTIONS: No  FALLS:  Has patient fallen in last 6 months? No   OCCUPATION: customer service  PLOF: Independent  PATIENT GOALS: decrease pain  NEXT MD VISIT: 09/2022  OBJECTIVE:   DIAGNOSTIC FINDINGS: x ray: Tricompartmental degenerative changes with partial joint space loss laterally. Enthesopathic changes off the superior patella.  PALPATION: TTP with inferior patellar mobs  LOWER EXTREMITY ROM:  Active ROM Right eval Left eval  Hip flexion 4- 3  Hip extension    Hip abduction 4- 3  Hip adduction    Hip internal rotation    Hip external rotation    Knee flexion 115 119  Knee extension 0 0  Ankle dorsiflexion    Ankle plantarflexion    Ankle inversion    Ankle eversion     (Blank rows = not tested)  LOWER EXTREMITY MMT:  MMT Right eval Left eval  Hip flexion  3  Hip extension    Hip abduction  3  Hip adduction    Hip internal rotation    Hip external rotation    Knee flexion 4 4  Knee extension 3+ 3+  Ankle dorsiflexion    Ankle plantarflexion    Ankle inversion    Ankle eversion     (Blank rows = not tested)   FUNCTIONAL TESTS:  SLS: Rt 10 seconds, Lt 3 sec   TODAY'S TREATMENT:   [facility] Adult PT Treatment:                                                DATE: 09/15/22 Therapeutic Exercise: Recumbent bike L1 x 3 min for warm up Standing hip abd, marching and ext x 15 each red TB Eccentric tap downs 4'' step forward and laterally x 10 each Step ups 6'' step x 15 TKE ball on wall 3 sec hold x  10 SLR x 12 Sidelying hip abd x 12 Lt Sidelying Clam with red TB x 20 Lt Prone HS curl red TB x 15 bilat Prone hip ext x 10 bilat red TB Sit <> stand x 10 without UE support    [facility] Adult PT Treatment:                                                DATE: 09/13/2022 Therapeutic Exercise: Recumbent bike L1 warm up x 36mn Mat table: L small ROM SLR x10 S/L clamshells RTB --< bent knee hip abd RTB x10 B  S/L pilates side kicks (side, fwd/bkwd) Seated knee extension GBT abound ankle Standing: Resisted hip abd & ext RTB x10 each B Resisted hip flexion YTB x10 (L) Front step up 6" step L SLS Bkwd step down (eccentric focus) 4" step L SLS Lateral heel tap 4" step Woodpecker (L)  TKE with ball behind knee                                                                                                                              PATIENT EDUCATION:  Education details: Updated HEP Person educated: Patient Education method: Explanation, Demonstration, and Handouts Education comprehension: verbalized understanding and returned demonstration  HOME EXERCISE PROGRAM: Access Code: [facility] URL: [email or web address] Date: 09/13/2022 Prepared by: Helane Gunther  Exercises - Hip Abduction with Resistance Loop  - 1 x daily - 7 x weekly - 3 sets - 10 reps - Hip Extension with Resistance Loop  - 1 x daily - 7 x weekly - 3 sets - 10 reps - Marching with Resistance  - 1 x daily - 7 x weekly - 3 sets - 10 reps - Standing Heel Raise with Support  - 1 x daily - 7 x weekly - 3 sets - 10 reps - Sidelying Hip Abduction  - 1 x daily - 7 x weekly - 3 sets - 10 reps - Clam with Resistance  - 1 x daily - 7 x weekly - 3 sets - 10 reps - Supine Bridge  - 1 x daily - 7 x weekly - 3 sets - 10 reps - Seated Knee Extension with Resistance  - 1 x daily - 7 x weekly - 3 sets - 10 reps - Standing Terminal Knee Extension at Wall with Ball  - 1 x daily - 7 x weekly - 3 sets - 10 reps - 5 sec  hold  ASSESSMENT:  CLINICAL IMPRESSION: Session abbreviated due to pt late to appointment. Pt able to add resisted prone HS curls and hip extensions. Some decreased tolerance to sidelying and prone exercises due to muscle fatigue. Pt will continue to benefit from LE strength and endurance training   OBJECTIVE IMPAIRMENTS: decreased activity tolerance, difficulty walking, decreased ROM, decreased strength, and pain.    GOALS: Goals reviewed with patient? Yes  SHORT TERM GOALS: Target date: 09/24/2022   Pt will be independent with initial HEP Baseline: Goal status: INITIAL   LONG TERM GOALS: Target date: 10/22/2022    Pt will be independent with advanced HEP Baseline:  Goal status: INITIAL  2.  Pt will improve Lt knee and hip strength to 4+/5 to improve stairs and walking with decreased pain Baseline:  Goal status: INITIAL  3.  Pt will improve SLS to >10 seconds on Lt to demo improved balance Baseline:  Goal status: INITIAL  4.  Pt will tolerate walking x 10 minutes with pain <= 3/10 Baseline:  Goal status: INITIAL    PLAN:  PT FREQUENCY: 2x/week  PT  DURATION: 6 weeks  PLANNED INTERVENTIONS: Therapeutic exercises, Therapeutic activity, Neuromuscular re-education, Balance training, Gait training, Patient/Family education, Self Care, Joint mobilization, Stair training, Aquatic Therapy, Dry Needling, Electrical stimulation, Cryotherapy, Moist heat, Taping, Vasopneumatic device, Ultrasound, Ionotophoresis 25m/ml Dexamethasone, Manual therapy, and Re-evaluation  PLAN FOR NEXT SESSION: Lt knee strength, balance, quad strengthening   Salem Lembke, PT 09/15/2022, 3:32 PM

## 2022-09-20 ENCOUNTER — Other Ambulatory Visit: Payer: Self-pay | Admitting: Family Medicine

## 2022-09-20 ENCOUNTER — Ambulatory Visit
Admission: EM | Admit: 2022-09-20 | Discharge: 2022-09-20 | Disposition: A | Discharge status: Home / Self Care | Payer: 59 | Attending: Family Medicine | Admitting: Family Medicine

## 2022-09-20 ENCOUNTER — Ambulatory Visit: Payer: 59

## 2022-09-20 ENCOUNTER — Encounter: Payer: Self-pay | Admitting: Emergency Medicine

## 2022-09-20 DIAGNOSIS — L918 Other hypertrophic disorders of the skin: Secondary | ICD-10-CM | POA: Diagnosis not present

## 2022-09-20 DIAGNOSIS — R293 Abnormal posture: Secondary | ICD-10-CM

## 2022-09-20 DIAGNOSIS — M6281 Muscle weakness (generalized): Secondary | ICD-10-CM

## 2022-09-20 DIAGNOSIS — G8929 Other chronic pain: Secondary | ICD-10-CM

## 2022-09-20 DIAGNOSIS — M25562 Pain in left knee: Secondary | ICD-10-CM | POA: Diagnosis not present

## 2022-09-20 MED ORDER — LIDOCAINE-EPINEPHRINE-TETRACAINE (LET) TOPICAL GEL
3.0000 mL | Freq: Once | TOPICAL | Status: AC
  Administered 2022-09-20: 3 mL via TOPICAL

## 2022-09-20 NOTE — Therapy (Addendum)
OUTPATIENT PHYSICAL THERAPY LOWER EXTREMITY TREATMENT AND DISCHARGE   Patient Name: Sara Gardner MRN: [id number] DOB:11/14/1950, 72 y.o., female Today's Date: 09/20/2022  END OF SESSION:  PT End of Session - 09/20/22 0932     Visit Number 4    Number of Visits 12    Date for PT Re-Evaluation 10/22/22    Authorization Type [employer] Medicare    Authorization - Visit Number 4    Progress Note Due on Visit 10    PT Start Time 0933    PT Stop Time 1015    PT Time Calculation (min) 42 min    Activity Tolerance Patient tolerated treatment well    Behavior During Therapy [facility] for tasks assessed/performed              Past Medical History:  Diagnosis Date   Aortic atherosclerosis (HCC) 06/10/2017   CAD (coronary artery disease)    Cancer (HCC)    Combined form of age-related cataract, both eyes 04/07/2017   Dermatochalasis of both eyelids 04/07/2017   Diabetes mellitus without complication (HCC)    GERD (gastroesophageal reflux disease)    Hypertension    Hypothyroidism    Lacunar infarction (HCC) 07/20/2018   Left renal mass 10/04/2018   1.7 cm hypoechoic mass 10/04/2018   Lumbar degenerative disc disease 03/09/2017   Myocardial infarction (HCC) 2005   Normocytic anemia 12/14/2018   Palpitations    Papillary thyroid carcinoma (HCC)    Posterior vitreous detachment, right eye 04/07/2017   Past Surgical History:  Procedure Laterality Date   TOTAL THYROIDECTOMY  2016   Patient Active Problem List   Diagnosis Date Noted   Hypotension 05/26/2021   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/05/2019   Cervicalgia 04/05/2019   Eustachian tube dysfunction, bilateral 04/05/2019   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   White matter abnormality on MRI of brain 04/05/2019   Myalgia due to statin 12/14/2018   Normocytic anemia 12/14/2018   At moderate risk for fall 10/03/2018   Bilateral renal cysts 10/03/2018   Facet arthropathy, lumbosacral 10/03/2018   Lacunar infarction  (HCC) 07/20/2018   Controlled type 2 diabetes mellitus without complication, without long-term current use of insulin (HCC) 03/08/2018   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/23/2017   Overactive bladder 08/17/2017   Equinus contracture of ankle 07/27/2017   Hammer toes of both feet 07/27/2017   Dermatophytosis, nail 07/27/2017   Calcaneal spur of both feet 07/27/2017   Aortic atherosclerosis (HCC) 06/10/2017   Vitreous detachment of right eye 05/12/2017   Combined form of age-related cataract, both eyes 04/07/2017   Dermatochalasis of both eyelids 04/07/2017   Posterior vitreous detachment, right eye 04/07/2017   Postural kyphosis of cervicothoracic region 03/09/2017   Lumbar degenerative disc disease 03/09/2017   Primary osteoarthritis of left knee 03/04/2017   Bilateral primary osteoarthritis of knee 12/15/2016   Allergy to honey bee venom 12/15/2016   Hypertension 02/16/2016   DM type 2 with diabetic dyslipidemia (HCC) 02/10/2016   Gastroesophageal reflux disease without esophagitis 02/10/2016   Hypothyroidism, 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   Diabetic sensorimotor polyneuropathy (HCC) 10/27/2015   Papillary thyroid carcinoma (HCC) 08/09/2014   Post-menopausal atrophic vaginitis 06/24/2014   Left thyroid nodule 01/29/2014   Nodule of left lung 01/21/2014   Class 1 obesity due to excess calories with serious comorbidity and body mass index (BMI) of 30.0 to 30.9 in adult 06/12/2012   Palpitations 11/29/2011   Abnormal mammogram of right breast 08/30/2011   Arthritis of left knee 05/24/2011  CAD (coronary artery disease) 05/24/2011   History of acute myocardial infarction of inferior wall 05/24/2011    PCP: Christen Butter  REFERRING PROVIDER: Benjamin Stain  REFERRING DIAG: Primary OA of Lt kne  THERAPY DIAG:  Chronic pain of left knee  Muscle weakness (generalized)  Abnormal posture  Rationale for Evaluation and Treatment: Rehabilitation  ONSET DATE:  06/2022  SUBJECTIVE:   SUBJECTIVE STATEMENT: Patient reports her knee pain increased while at church with prolonged knee flexion. Patient states her knee pain is 6/10 but it decreases with physical activity.   PERTINENT HISTORY: Previous PT for cervical pain PAIN:  Are you having pain? Yes: NPRS scale: 6/10 Pain location: Lt knee Pain description: sore, ache Aggravating factors: stairs, walking, high heels Relieving factors: rest  PRECAUTIONS: None  WEIGHT BEARING RESTRICTIONS: No  FALLS:  Has patient fallen in last 6 months? No   OCCUPATION: customer service  PLOF: Independent  PATIENT GOALS: decrease pain  NEXT MD VISIT: 09/2022  OBJECTIVE:   DIAGNOSTIC FINDINGS: x ray: Tricompartmental degenerative changes with partial joint space loss laterally. Enthesopathic changes off the superior patella.  PALPATION: TTP with inferior patellar mobs  LOWER EXTREMITY ROM:  Active ROM Right eval Left eval  Hip flexion 4- 3  Hip extension    Hip abduction 4- 3  Hip adduction    Hip internal rotation    Hip external rotation    Knee flexion 115 119  Knee extension 0 0  Ankle dorsiflexion    Ankle plantarflexion    Ankle inversion    Ankle eversion     (Blank rows = not tested)  LOWER EXTREMITY MMT:  MMT Right eval Left eval  Hip flexion  3  Hip extension    Hip abduction  3  Hip adduction    Hip internal rotation    Hip external rotation    Knee flexion 4 4  Knee extension 3+ 3+  Ankle dorsiflexion    Ankle plantarflexion    Ankle inversion    Ankle eversion     (Blank rows = not tested)   FUNCTIONAL TESTS:  SLS: Rt 10 seconds, Lt 3 sec   TODAY'S TREATMENT:    [facility] Adult PT Treatment:                                                DATE: 09/20/2022 Therapeutic Exercise: Recumbent bike L3 x 5 min for warm up TKE ball on wall 10x3" S/L clamshells RTB 2x15 S/L straight leg hip abd (RTB above) knees 2x10 L SLR (small range): parallel, ER, IR x10  each Prone HS curl YTB 2x10 B Prone hip ext 2x10 B STS (RTB above knees) x10 no HHA --> added overhead press with 6#DB x10 Standing hip abd RTB x15  Standing marching (RTB crossed at forefoot) x10 Eccentric heel tap downs 4'' step forward x10 (lateral heel tap discontinued d/t pain) L LAQ 4#AW 2x10x5" Step ups 6'' step 2x10 (focus on inititing knee flexion and eccentric control) Leg press: DL 16# 1W96 (RTB at lateral L knee)   [facility] Adult PT Treatment:                                                DATE:  09/15/22 Therapeutic Exercise: Recumbent bike L1 x 3 min for warm up Standing hip abd, marching and ext x 15 each red TB Eccentric tap downs 4'' step forward and laterally x 10 each Step ups 6'' step x 15 TKE ball on wall 3 sec hold x 10 SLR x 12 Sidelying hip abd x 12 Lt Sidelying Clam with red TB x 20 Lt Prone HS curl red TB x 15 bilat Prone hip ext x 10 bilat red TB Sit <> stand x 10 without UE support                                                                                                                              PATIENT EDUCATION:  Education details: Updated HEP Person educated: Patient Education method: Explanation, Demonstration, and Handouts Education comprehension: verbalized understanding and returned demonstration  HOME EXERCISE PROGRAM: Access Code: [facility] URL: [email or web address] Date: 09/13/2022 Prepared by: Carlynn Herald  Exercises - Hip Abduction with Resistance Loop  - 1 x daily - 7 x weekly - 3 sets - 10 reps - Hip Extension with Resistance Loop  - 1 x daily - 7 x weekly - 3 sets - 10 reps - Marching with Resistance  - 1 x daily - 7 x weekly - 3 sets - 10 reps - Standing Heel Raise with Support  - 1 x daily - 7 x weekly - 3 sets - 10 reps - Sidelying Hip Abduction  - 1 x daily - 7 x weekly - 3 sets - 10 reps - Clam with Resistance  - 1 x daily - 7 x weekly - 3 sets - 10 reps - Supine Bridge  - 1 x daily - 7 x weekly - 3 sets -  10 reps - Seated Knee Extension with Resistance  - 1 x daily - 7 x weekly - 3 sets - 10 reps - Standing Terminal Knee Extension at Wall with Ball  - 1 x daily - 7 x weekly - 3 sets - 10 reps - 5 sec hold  ASSESSMENT:  CLINICAL IMPRESSION: L knee hyperextension noted during step ups on stairs; patient exhibited mild difficulty when cueing slow pace for eccentric control and knee flexion during step down. Resistance band placed at lateral L knee slightly improved knee valgus during leg press exercise.   OBJECTIVE IMPAIRMENTS: decreased activity tolerance, difficulty walking, decreased ROM, decreased strength, and pain.    GOALS: Goals reviewed with patient? Yes  SHORT TERM GOALS: Target date: 09/24/2022   Pt will be independent with initial HEP Baseline: Goal status: INITIAL   LONG TERM GOALS: Target date: 10/22/2022    Pt will be independent with advanced HEP Baseline:  Goal status: INITIAL  2.  Pt will improve Lt knee and hip strength to 4+/5 to improve stairs and walking with decreased pain Baseline:  Goal status: INITIAL  3.  Pt will improve SLS to >10 seconds on Lt to demo improved balance Baseline:  Goal status: INITIAL  4.  Pt will tolerate walking x 10 minutes with pain <= 3/10 Baseline:  Goal status: INITIAL    PLAN:  PT FREQUENCY: 2x/week  PT DURATION: 6 weeks  PLANNED INTERVENTIONS: Therapeutic exercises, Therapeutic activity, Neuromuscular re-education, Balance training, Gait training, Patient/Family education, Self Care, Joint mobilization, Stair training, Aquatic Therapy, Dry Needling, Electrical stimulation, Cryotherapy, Moist heat, Taping, Vasopneumatic device, Ultrasound, Ionotophoresis 4mg /ml Dexamethasone, Manual therapy, and Re-evaluation  PLAN FOR NEXT SESSION: Lt knee strength, balance, quad strengthening PHYSICAL THERAPY DISCHARGE SUMMARY  Visits from Start of Care: 4  Current functional level related to goals / functional  outcomes: Decreased pain, improving strength   Remaining deficits: See above   Education / Equipment: HEP   Patient agrees to discharge. Patient goals were partially met. Patient is being discharged due to not returning since the last visit.  Reggy Eye, PT,DPT04/24/242:15 PM    Sanjuana Mae, PTA 09/20/2022, 10:16 AM

## 2022-09-20 NOTE — ED Triage Notes (Signed)
Pt here with c/o painful skin tag on right thigh. States she had it frozen on 2/7 in primary care and since then area has become larger and more painful.

## 2022-09-20 NOTE — ED Provider Notes (Signed)
[name]    CSN: [id number] Arrival date & time: 09/20/22  1017      History   Chief Complaint Chief Complaint  Patient presents with   Skin Tag    HPI Sara Gardner is a 72 y.o. female.   HPI  Patient is here with a painful skin tag on the inside of her right thigh.  Is been present for many years.  Is been removed once with cryotherapy.  Her primary care doctor froze it again about 3 weeks ago.  Ever since then it is becoming increasingly larger, painful, it is bothering her with every step that she takes because it rubs on the opposite thigh.  No drainage.  No redness or fever.   Past Medical History:  Diagnosis Date   Aortic atherosclerosis ([place]) 06/10/2017   CAD (coronary artery disease)    Cancer (HCC)    Combined form of age-related cataract, both eyes 04/07/2017   Dermatochalasis of both eyelids 04/07/2017   Diabetes mellitus without complication (HCC)    GERD (gastroesophageal reflux disease)    Hypertension    Hypothyroidism    Lacunar infarction ([place]) 07/20/2018   Left renal mass 10/04/2018   1.7 cm hypoechoic mass 10/04/2018   Lumbar degenerative disc disease 03/09/2017   Myocardial infarction ([place]) 2005   Normocytic anemia 12/14/2018   Palpitations    Papillary thyroid carcinoma ([place])    Posterior vitreous detachment, right eye 04/07/2017    Patient Active Problem List   Diagnosis Date Noted   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/05/2019   Cervicalgia 04/05/2019   Eustachian tube dysfunction, bilateral 04/05/2019   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[place]) 04/05/2019   White matter abnormality on MRI of brain 04/05/2019   Myalgia due to statin 12/14/2018   Normocytic anemia 12/14/2018   At moderate risk for fall 10/03/2018   Bilateral renal cysts 10/03/2018   Facet arthropathy, lumbosacral 10/03/2018   Lacunar infarction ([place]) 07/20/2018   Controlled type 2 diabetes mellitus without complication, without long-term current use of insulin  ([place]) 03/08/2018   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/23/2017   Overactive bladder 08/17/2017   Equinus contracture of ankle 07/27/2017   Hammer toes of both feet 07/27/2017   Dermatophytosis, nail 07/27/2017   Calcaneal spur of both feet 07/27/2017   Aortic atherosclerosis ([place]) 06/10/2017   Combined form of age-related cataract, both eyes 04/07/2017   Dermatochalasis of both eyelids 04/07/2017   Posterior vitreous detachment, right eye 04/07/2017   Postural kyphosis of cervicothoracic region 03/09/2017   Lumbar degenerative disc disease 03/09/2017   Bilateral primary osteoarthritis of knee 12/15/2016   Allergy to honey bee venom 12/15/2016   Hypertension 02/16/2016   DM type 2 with diabetic dyslipidemia ([place]) 02/10/2016   Gastroesophageal reflux disease without esophagitis 02/10/2016   Hypothyroidism, 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   Diabetic sensorimotor polyneuropathy ([place]) 10/27/2015   Papillary thyroid carcinoma ([place]) 08/09/2014   Post-menopausal atrophic vaginitis 06/24/2014   Nodule of left lung 01/21/2014   Class 1 obesity due to excess calories with serious comorbidity and body mass index (BMI) of 30.0 to 30.9 in adult 06/12/2012   Palpitations 11/29/2011   Abnormal mammogram of right breast 08/30/2011   Arthritis of left knee 05/24/2011   CAD (coronary artery disease) 05/24/2011   History of acute myocardial infarction of inferior wall 05/24/2011    Past Surgical History:  Procedure Laterality Date   TOTAL THYROIDECTOMY  2016    OB History   No obstetric history on file.  Home Medications    Prior to Admission medications   Medication Sig Start Date End Date Taking? Authorizing Provider  Accu-Chek Softclix Lancets lancets Use as instructed. Accu-chek Guide 10/23/21   Sara Bouche, NP  AMBULATORY NON FORMULARY MEDICATION Knee-high, medium compression, graduated compression stockings. Apply to lower extremities. [email or web address], Zippered  Compression Stockings, medium circ, long length 12/15/16   [name], PA-C  ammonium lactate (LAC-HYDRIN) 12 % lotion APPLY TOPICALLY TO THE AFFECTED AREA AS NEEDED FOR DRY SKIN 02/10/22   Sara Bouche, NP  ASPIRIN PO Take 81 mg by mouth daily.    [provider]  blood glucose meter kit and supplies Dispense based on patient and insurance preference. Check daily. (FOR ICD-10 E10.9, E11.9). 10/23/21   Sara Bouche, NP  DICLOFENAC SODIUM EX Apply topically as needed.    [provider]  Dulaglutide (TRULICITY) 1.5 0000000 SOPN Inject 1.5 mg into the skin once a week. 08/31/22   Sara Bouche, NP  esomeprazole (NEXIUM) 20 MG capsule Take 1 capsule (20 mg total) by mouth daily at 12 noon. 06/11/22   Sara Bouche, NP  fluticasone (FLONASE) 50 MCG/ACT nasal spray Place into both nostrils as needed for allergies or rhinitis.    [provider]  glucose blood (ACCU-CHEK GUIDE) test strip Use as instructed. Accu-chek Guide 10/23/21   Sara Bouche, NP  isosorbide mononitrate (IMDUR) 60 MG 24 hr tablet Take 1 tablet (60 mg total) by mouth daily. 05/21/22   Lelon Perla, MD  levothyroxine (SYNTHROID) 75 MCG tablet Take 1 tablet (75 mcg total) by mouth daily before breakfast. 07/28/22   Sara Bouche, NP  lisinopril-hydrochlorothiazide (ZESTORETIC) 20-12.5 MG tablet Take 1 tablet by mouth daily. 05/21/22   Lelon Perla, MD  metFORMIN (GLUCOPHAGE) 1000 MG tablet TAKE 1 TABLET(1000 MG) BY MOUTH EVERY MORNING 06/11/22   Sara Bouche, NP  metoprolol succinate (TOPROL-XL) 50 MG 24 hr tablet TAKE 1 TABLET(50 MG) BY MOUTH DAILY WITH OR IMMEDIATELY FOLLOWING A MEAL 05/21/22   Crenshaw, Denice Bors, MD  nitroGLYCERIN (NITROSTAT) 0.4 MG SL tablet DISSOLVE 1 TABLET UNDER THE TONGUE EVERY 5 MINUTES, AS NEEDED FOR CHEST PAIN. MAX 3 TABLETS IN 15 MINUTES 09/07/21   Lelon Perla, MD  rosuvastatin (CRESTOR) 40 MG tablet TAKE 1 TABLET(40 MG) BY MOUTH AT BEDTIME 05/20/22   Sara Bouche, NP   VITAMIN D PO Take by mouth.    [provider]    Family History Family History  Problem Relation Age of Onset   Hypertension Mother    Hypertension Sister    Hypertension Brother    Breast cancer Daughter    Hypertension Sister     Social History Social History   Tobacco Use   Smoking status: Former   Smokeless tobacco: Never  Scientific laboratory technician Use: Never used  Substance Use Topics   Alcohol use: No   Drug use: No     Allergies   Bee venom, Tuberculin tests, and Tape   Review of Systems Review of Systems See HPI  Physical Exam Triage Vital Signs ED Triage Vitals  Enc Vitals Group     BP 09/20/22 1033 116/72     Pulse Rate 09/20/22 1033 75     Resp 09/20/22 1033 16     Temp 09/20/22 1033 98.3 F (36.8 C)     Temp Source 09/20/22 1033 Oral     SpO2 09/20/22 1033 99 %     Weight --  Height --      Head Circumference --      Peak Flow --      Pain Score 09/20/22 1034 8     Pain Loc --      Pain Edu? --      Excl. in [place]? --    No data found.  Updated Vital Signs BP 116/72 (BP Location: Right Arm)   Pulse 75   Temp 98.3 F (36.8 C) (Oral)   Resp 16   SpO2 99%      Physical Exam Constitutional:      General: She is not in acute distress.    Appearance: Normal appearance. She is well-developed.  HENT:     Head: Normocephalic and atraumatic.  Eyes:     Conjunctiva/sclera: Conjunctivae normal.     Pupils: Pupils are equal, round, and reactive to light.  Cardiovascular:     Rate and Rhythm: Normal rate.  Pulmonary:     Effort: Pulmonary effort is normal. No respiratory distress.  Abdominal:     General: There is no distension.     Palpations: Abdomen is soft.  Musculoskeletal:        General: Normal range of motion.     Cervical back: Normal range of motion.  Skin:    General: Skin is warm and dry.     Findings: Lesion present.     Comments: Upper medial right thigh there is a pedunculated lesion measuring 12 mm across,  inflamed around the base.  Tender.  Neurological:     Mental Status: She is alert.      UC Treatments / Results  Labs (all labs ordered are listed, but only abnormal results are displayed) Labs Reviewed  SURGICAL PATHOLOGY    EKG   Radiology No results found.  Procedures Wound Care  Date/Time: 09/20/2022 11:47 AM  Performed by: Raylene Everts, MD Authorized by: Raylene Everts, MD   Consent:    Consent obtained:  Verbal   Consent given by:  Patient   Alternatives discussed:  Alternative treatment and observation Universal protocol:    Patient identity confirmed:  Verbally with patient and arm band Pre-procedure details:    Preparation: Patient was prepped and draped in usual sterile fashion   Sedation:    Sedation type:  None Anesthesia:    Anesthesia method:  Topical application   Topical anesthetic:  LET Procedure details:    Indications: benign lesion     Wound location:  Leg   Leg location:  R upper leg Post-procedure details:    Procedure completion:  Tolerated Comments:     Pedunculated skin tag of thigh.  Cleansed with Betadine.  Anesthetized with 1% lidocaine after Let applied Skin tag was sharply removed Pressure right base and silver nitrate chemical cautery Bandage placed  (including critical care time)  Medications Ordered in UC Medications  lidocaine-EPINEPHrine-tetracaine (LET) topical gel (3 mLs Topical Given 09/20/22 1127)    Initial Impression / Assessment and Plan / UC Course  I have reviewed the triage vital signs and the nursing notes.  Pertinent labs & imaging results that were available during my care of the patient were reviewed by me and considered in my medical decision making (see chart for details).     Wound care is discussed.  Patient requests that the lesion be sent for pathology.  She is concerned because of the growth.  This is done. Final Clinical Impressions(s) / UC Diagnoses   Final diagnoses:  Inflamed  acrochordon     Discharge Instructions      Wash once a day and apply bacitracin antibiotic ointment Use bandaid until skin heals Call for problems   ED Prescriptions   None    PDMP not reviewed this encounter.   Raylene Everts, MD 09/20/22 [phone]

## 2022-09-20 NOTE — Discharge Instructions (Signed)
Wash once a day and apply bacitracin antibiotic ointment Use bandaid until skin heals Call for problems

## 2022-09-20 NOTE — ED Notes (Signed)
LET applied at 1103

## 2022-09-22 ENCOUNTER — Ambulatory Visit: Payer: 59 | Admitting: Physical Therapy

## 2022-09-24 ENCOUNTER — Ambulatory Visit (INDEPENDENT_AMBULATORY_CARE_PROVIDER_SITE_OTHER): Payer: 59 | Admitting: Medical-Surgical

## 2022-09-24 ENCOUNTER — Other Ambulatory Visit: Payer: Self-pay

## 2022-09-24 DIAGNOSIS — Z0000 Encounter for general adult medical examination without abnormal findings: Secondary | ICD-10-CM | POA: Diagnosis not present

## 2022-09-24 DIAGNOSIS — Z780 Asymptomatic menopausal state: Secondary | ICD-10-CM | POA: Diagnosis not present

## 2022-09-24 DIAGNOSIS — E1169 Type 2 diabetes mellitus with other specified complication: Secondary | ICD-10-CM

## 2022-09-24 NOTE — Progress Notes (Signed)
MEDICARE ANNUAL WELLNESS VISIT  09/24/2022  Telephone Visit Disclaimer This Medicare AWV was conducted by telephone due to national recommendations for restrictions regarding the COVID-19 Pandemic (e.g. social distancing).  I verified, using two identifiers, that I am speaking with Sara Gardner or their authorized healthcare agent. I discussed the limitations, risks, security, and privacy concerns of performing an evaluation and management service by telephone and the potential availability of an in-person appointment in the future. The patient expressed understanding and agreed to proceed.  Location of Patient: Home Location of Provider (nurse):  In the office.   Subjective:    Sara Gardner is a 72 y.o. female patient of Sara Bouche, NP who had a Medicare Annual Wellness Visit today via telephone. [name] is Retired and lives alone. she has 1 child. she reports that she is socially active and does interact with friends/family regularly. she is minimally physically active and enjoys dancing, playing cards and travel.   Patient Care Team: Sara Bouche, NP as PCP - General (Nurse Practitioner) Lelon Perla, MD as PCP - Cardiology (Cardiology) Ruthy Dick, MD as Consulting Physician (Ophthalmology) Judieth Keens, MD as Consulting Physician (Endocrinology)     09/24/2022    8:12 AM 09/10/2022    8:43 AM 09/06/2021    3:00 AM 09/16/2017    1:26 PM  Advanced Directives  Does Patient Have a Medical Advance Directive? No No No Yes  Type of Advance Directive Living will   [place];Living will  Does patient want to make changes to medical advance directive? No - Patient declined     Copy of [place] in Chart?    No - copy requested    Hospital Utilization Over the Past 12 Months: # of hospitalizations or ER visits: 2 # of surgeries: 0  Review of Systems    Patient reports that her overall health is unchanged compared to  last year.  History obtained from chart review and the patient  Patient Reported Readings (BP, Pulse, CBG, Weight, etc) none  Pain Assessment Pain : 0-10 Pain Score: 7  Pain Type: Chronic pain Pain Location: Knee Pain Orientation: Left Pain Descriptors / Indicators: Constant Pain Onset: More than a month ago Pain Frequency: Intermittent     Current Medications & Allergies (verified) Allergies as of 09/24/2022       Reactions   Bee Venom Swelling   Tuberculin Tests Other (See Comments)   Tape Rash        Medication List        Accurate as of September 24, 2022  8:37 AM. If you have any questions, ask your nurse or doctor.          Accu-Chek Guide test strip Generic drug: glucose blood Use as instructed. Accu-chek Guide   Accu-Chek Softclix Lancets lancets Use as instructed. Accu-chek Guide   AMBULATORY NON FORMULARY MEDICATION Knee-high, medium compression, graduated compression stockings. Apply to lower extremities. [email or web address], Zippered Compression Stockings, medium circ, long length   ammonium lactate 12 % lotion Commonly known as: LAC-HYDRIN APPLY TOPICALLY TO THE AFFECTED AREA AS NEEDED FOR DRY SKIN   ASPIRIN PO Take 81 mg by mouth daily.   blood glucose meter kit and supplies Dispense based on patient and insurance preference. Check daily. (FOR ICD-10 E10.9, E11.9).   DICLOFENAC SODIUM EX Apply topically as needed.   esomeprazole 20 MG capsule Commonly known as: NEXIUM Take 1 capsule (20 mg total) by mouth daily at 12  noon.   fluticasone 50 MCG/ACT nasal spray Commonly known as: FLONASE Place into both nostrils as needed for allergies or rhinitis.   gabapentin 300 MG capsule Commonly known as: NEURONTIN   hydrOXYzine 25 MG capsule Commonly known as: VISTARIL   isosorbide mononitrate 60 MG 24 hr tablet Commonly known as: IMDUR Take 1 tablet (60 mg total) by mouth daily.   levothyroxine 75 MCG tablet Commonly known as:  SYNTHROID Take 1 tablet (75 mcg total) by mouth daily before breakfast.   lisinopril-hydrochlorothiazide 20-12.5 MG tablet Commonly known as: Zestoretic Take 1 tablet by mouth daily.   metFORMIN 1000 MG tablet Commonly known as: GLUCOPHAGE TAKE 1 TABLET(1000 MG) BY MOUTH EVERY MORNING   metoprolol succinate 50 MG 24 hr tablet Commonly known as: TOPROL-XL TAKE 1 TABLET(50 MG) BY MOUTH DAILY WITH OR IMMEDIATELY FOLLOWING A MEAL   nitroGLYCERIN 0.4 MG SL tablet Commonly known as: NITROSTAT DISSOLVE 1 TABLET UNDER THE TONGUE EVERY 5 MINUTES, AS NEEDED FOR CHEST PAIN. MAX 3 TABLETS IN 15 MINUTES   rosuvastatin 40 MG tablet Commonly known as: CRESTOR TAKE 1 TABLET(40 MG) BY MOUTH AT BEDTIME   triamcinolone cream 0.1 % Commonly known as: KENALOG   Trulicity 1.5 0000000 Sopn Generic drug: Dulaglutide Inject 1.5 mg into the skin once a week.   VITAMIN D PO Take by mouth.        History (reviewed): Past Medical History:  Diagnosis Date   Aortic atherosclerosis ([place]) 06/10/2017   CAD (coronary artery disease)    Cancer (HCC)    Combined form of age-related cataract, both eyes 04/07/2017   Dermatochalasis of both eyelids 04/07/2017   Diabetes mellitus without complication (HCC)    GERD (gastroesophageal reflux disease)    Hypertension    Hypothyroidism    Lacunar infarction ([place]) 07/20/2018   Left renal mass 10/04/2018   1.7 cm hypoechoic mass 10/04/2018   Lumbar degenerative disc disease 03/09/2017   Myocardial infarction ([place]) 2005   Normocytic anemia 12/14/2018   Palpitations    Papillary thyroid carcinoma (HCC)    Posterior vitreous detachment, right eye 04/07/2017   Past Surgical History:  Procedure Laterality Date   TOTAL THYROIDECTOMY  2016   Family History  Problem Relation Age of Onset   Hypertension Mother    Hypertension Sister    Hypertension Brother    Breast cancer Daughter    Hypertension Sister    Social History   Socioeconomic History    Marital status: Divorced    Spouse name: Not on file   Number of children: 1   Years of education: 16   Highest education level: Bachelor's degree (e.g., BA, AB, BS)  Occupational History   Occupation: Retired.  Tobacco Use   Smoking status: Former   Smokeless tobacco: Never  Vaping Use   Vaping Use: Never used  Substance and Sexual Activity   Alcohol use: No   Drug use: No   Sexual activity: Not Currently  Other Topics Concern   Not on file  Social History Narrative   Lives alone. She has one daughter who lives close by. She enjoys dancing, playing cards and travel.    Social Determinants of Health   Financial Resource Strain: Low Risk  (09/24/2022)   Overall Financial Resource Strain (CARDIA)    Difficulty of Paying Living Expenses: Not hard at all  Food Insecurity: No Food Insecurity (09/24/2022)   Hunger Vital Sign    Worried About Running Out of Food in the Last Year: Never true  Ran Out of Food in the Last Year: Never true  Transportation Needs: No Transportation Needs (09/24/2022)   PRAPARE - Hydrologist (Medical): No    Lack of Transportation (Non-Medical): No  Physical Activity: Not on file  Stress: No Stress Concern Present (09/24/2022)   [place]    Feeling of Stress : Only a little  Social Connections: Moderately Integrated (09/24/2022)   Social Connection and Isolation Panel [NHANES]    Frequency of Communication with Friends and Family: More than three times a week    Frequency of Social Gatherings with Friends and Family: Three times a week    Attends Religious Services: More than 4 times per year    Active Member of Clubs or Organizations: Yes    Attends Archivist Meetings: More than 4 times per year    Marital Status: Divorced    Activities of Daily Living    09/24/2022    8:24 AM  In your present state of health, do you have any difficulty performing  the following activities:  Hearing? 0  Vision? 0  Comment wears glasses  Difficulty concentrating or making decisions? 0  Walking or climbing stairs? 1  Comment climbing stairs  Dressing or bathing? 0  Doing errands, shopping? 0  Preparing Food and eating ? N  Using the Toilet? N  In the past six months, have you accidently leaked urine? N  Do you have problems with loss of bowel control? N  Managing your Medications? N  Managing your Finances? N  Housekeeping or managing your Housekeeping? N    Patient Education/ Literacy How often do you need to have someone help you when you read instructions, pamphlets, or other written materials from your doctor or pharmacy?: 1 - Never What is the last grade level you completed in school?: Bachelor's degree  Exercise Current Exercise Habits: Structured exercise class, Type of exercise: Other - see comments;stretching (PT), Time (Minutes): 60, Frequency (Times/Week): 2, Weekly Exercise (Minutes/Week): 120, Intensity: Moderate, Exercise limited by: orthopedic condition(s)  Diet Patient reports consuming 3 meals a day and 1 snack(s) a day Patient reports that her primary diet is: Regular Patient reports that she does have regular access to food.   Depression Screen    09/24/2022    8:13 AM 04/23/2022   11:32 AM 01/05/2022    4:06 PM 07/24/2021   10:44 AM 03/25/2021    3:32 PM 04/05/2019   10:10 AM 03/21/2018    2:57 PM  PHQ 2/9 Scores  PHQ - 2 Score 0 0 0 0 0 0 0     Fall Risk    09/24/2022    8:12 AM 08/31/2022    8:19 AM 01/05/2022    4:06 PM 07/24/2021   10:43 AM 03/25/2021    3:31 PM  Fall Risk   Falls in the past year? 0 0 0 1 1  Number falls in past yr: 0 0 0 0 0  Injury with Fall? 0 0 0 0 0  Risk for fall due to : No Fall Risks No Fall Risks No Fall Risks Impaired balance/gait   Follow up Falls evaluation completed Falls evaluation completed Falls evaluation completed Falls prevention discussed;Falls evaluation completed Falls  evaluation completed     Objective:  Fatimazahra Irias seemed alert and oriented and she participated appropriately during our telephone visit.  Blood Pressure Weight BMI  BP Readings from Last 3 Encounters:  09/20/22 116/72  08/31/22 98/64  08/11/22 102/62   Wt Readings from Last 3 Encounters:  08/31/22 171 lb 9.6 oz (77.8 kg)  08/11/22 172 lb (78 kg)  04/23/22 164 lb 14.4 oz (74.8 kg)   BMI Readings from Last 1 Encounters:  08/31/22 30.40 kg/m    *Unable to obtain current vital signs, weight, and BMI due to telephone visit type  Hearing/Vision  Riyan did not seem to have difficulty with hearing/understanding during the telephone conversation Reports that she has had a formal eye exam by an eye care professional within the past year Reports that she has not had a formal hearing evaluation within the past year *Unable to fully assess hearing and vision during telephone visit type  Cognitive Function:    09/24/2022    8:30 AM 03/21/2018    2:56 PM  6CIT Screen  What Year? 0 points 0 points  What month? 0 points 0 points  What time? 0 points 0 points  Count back from 20 0 points 0 points  Months in reverse 0 points 0 points  Repeat phrase 0 points 0 points  Total Score 0 points 0 points   (Normal:0-7, Significant for Dysfunction: >8)  Normal Cognitive Function Screening: Yes   Immunization & Health Maintenance Record Immunization History  Administered Date(s) Administered   Fluad Quad(high Dose 65+) 04/05/2019, 04/23/2022   Hepatitis B, ADULT 09/28/2019, 10/31/2019   Influenza Inj Mdck Quad Pf 04/21/2015   Influenza, High Dose Seasonal PF 05/28/2016, 04/24/2018   PFIZER(Purple Top)SARS-COV-2 Vaccination 08/23/2019, 09/14/2019, 05/21/2020, 11/11/2020   Pneumococcal Conjugate-13 06/24/2014   Pneumococcal Polysaccharide-23 01/10/2017   Td (Adult), 2 Lf Tetanus Toxid, Preservative Free 08/21/2004   Tdap 05/19/2015    Health Maintenance  Topic Date Due   FOOT  EXAM  09/25/2022 (Originally 03/25/2022)   COVID-19 Vaccine (5 - 2023-24 season) 10/10/2022 (Originally 03/26/2022)   Zoster Vaccines- Shingrix (1 of 2) 12/25/2022 (Originally 06/17/1970)   OPHTHALMOLOGY EXAM  10/25/2022   HEMOGLOBIN A1C  03/01/2023   Diabetic kidney evaluation - eGFR measurement  08/13/2023   Diabetic kidney evaluation - Urine ACR  09/01/2023   Medicare Annual Wellness (AWV)  09/24/2023   MAMMOGRAM  08/02/2024   DTaP/Tdap/Td (2 - Td or Tdap) 05/18/2025   COLONOSCOPY (Pts 45-53yr Insurance coverage will need to be confirmed)  10/07/2027   Pneumonia Vaccine 72 Years old  Completed   INFLUENZA VACCINE  Completed   DEXA SCAN  Completed   Hepatitis C Screening  Completed   HPV VACCINES  Aged Out       Assessment  This is a routine wellness examination for [employer]  Health Maintenance: Due or Overdue There are no preventive care reminders to display for this patient.   PFletcher Anondoes not need a referral for Community Assistance: Care Management:   no Social Work:    no Prescription Assistance:  no Nutrition/Diabetes Education:  no   Plan:  Personalized Goals  Goals Addressed               This Visit's Progress     Patient Stated (pt-stated)        09/24/2022 AWV Goal: Diabetes Management  Patient will maintain an A1C level below 8.0 Patient will not develop any diabetic foot complications Patient will not experience any hypoglycemic episodes over the next 3 months Patient will notify our office of any CBG readings outside of the provider recommended range by calling [phone] will adhere to provider recommendations for  diabetes management  Patient Self Management Activities take all medications as prescribed and report any negative side effects monitor and record blood sugar readings as directed adhere to a low carbohydrate diet that incorporates lean proteins, vegetables, whole grains, low glycemic fruits check feet  daily noting any sores, cracks, injuries, or callous formations see PCP or podiatrist if she notices any changes in her legs, feet, or toenails Patient will visit PCP and have an A1C level checked every 3 to 6 months as directed  have a yearly eye exam to monitor for vascular changes associated with diabetes and will request that the report be sent to her pcp.  consult with her PCP regarding any changes in her health or new or worsening symptoms        Personalized Health Maintenance & Screening Recommendations  Bone densitometry screening Shingrix vaccine - patient declined  Lung Cancer Screening Recommended: no (Low Dose CT Chest recommended if Age 62-80 years, 30 pack-year currently smoking OR have quit w/in past 15 years) Hepatitis C Screening recommended: no HIV Screening recommended: no  Advanced Directives: Written information was not prepared per patient's request.  Referrals & Orders Orders Placed This Encounter  Procedures   [place]    Follow-up Plan Follow-up with Sara Bouche, NP as planned Medicare wellness visit in one year.  AVS printed and mailed to patient   I have personally reviewed and noted the following in the patient's chart:   Medical and social history Use of alcohol, tobacco or illicit drugs  Current medications and supplements Functional ability and status Nutritional status Physical activity Advanced directives List of other physicians Hospitalizations, surgeries, and ER visits in previous 12 months Vitals Screenings to include cognitive, depression, and falls Referrals and appointments  In addition, I have reviewed and discussed with Sara Gardner certain preventive protocols, quality metrics, and best practice recommendations. A written personalized care plan for preventive services as well as general preventive health recommendations is available and can be mailed to the patient at her request.      Tinnie Gens, RN  BSN  09/24/2022

## 2022-09-24 NOTE — Patient Instructions (Addendum)
[facility] Maintenance Summary and Written Plan of Care  Sara Gardner ,  Thank you for allowing me to perform your Medicare Annual Wellness Visit and for your ongoing commitment to your health.   Health Maintenance & Immunization History Health Maintenance  Topic Date Due   FOOT EXAM  09/25/2022 (Originally 03/25/2022)   COVID-19 Vaccine (5 - 2023-24 season) 10/10/2022 (Originally 03/26/2022)   Zoster Vaccines- Shingrix (1 of 2) 12/25/2022 (Originally 06/17/1970)   OPHTHALMOLOGY EXAM  10/25/2022   HEMOGLOBIN A1C  03/01/2023   Diabetic kidney evaluation - eGFR measurement  08/13/2023   Diabetic kidney evaluation - Urine ACR  09/01/2023   Medicare Annual Wellness (AWV)  09/24/2023   MAMMOGRAM  08/02/2024   DTaP/Tdap/Td (2 - Td or Tdap) 05/18/2025   COLONOSCOPY (Pts 45-38yr Insurance coverage will need to be confirmed)  10/07/2027   Pneumonia Vaccine 72 Years old  Completed   INFLUENZA VACCINE  Completed   DEXA SCAN  Completed   Hepatitis C Screening  Completed   HPV VACCINES  Aged Out   Immunization History  Administered Date(s) Administered   Fluad Quad(high Dose 73+) 04/05/2019, 04/23/2022   Hepatitis B, ADULT 09/28/2019, 10/31/2019   Influenza Inj Mdck Quad Pf 04/21/2015   Influenza, High Dose Seasonal PF 05/28/2016, 73/30/2019   PFIZER(Purple Top)SARS-COV-2 Vaccination 08/23/2019, 09/14/2019, 05/21/2020, 11/11/2020   Pneumococcal Conjugate-13 06/24/2014   Pneumococcal Polysaccharide-23 01/10/2017   Td (Adult), 2 Lf Tetanus Toxid, Preservative Free 08/22/71   Tdap 05/19/2015    These are the patient goals that we discussed:  Goals Addressed               This Visit's Progress     Patient Stated (pt-stated)        09/24/2022 AWV Goal: Diabetes Management  Patient will maintain an A1C level below 8.0 Patient will not develop any diabetic foot complications Patient will not experience any hypoglycemic episodes over the next 3  months Patient will notify our office of any CBG readings outside of the provider recommended range by calling [phone] will adhere to provider recommendations for diabetes management  Patient Self Management Activities take all medications as prescribed and report any negative side effects monitor and record blood sugar readings as directed adhere to a low carbohydrate diet that incorporates lean proteins, vegetables, whole grains, low glycemic fruits check feet daily noting any sores, cracks, injuries, or callous formations see PCP or podiatrist if she notices any changes in her legs, feet, or toenails Patient will visit PCP and have an A1C level checked every 3 to 6 months as directed  have a yearly eye exam to monitor for vascular changes associated with diabetes and will request that the report be sent to her pcp.  consult with her PCP regarding any changes in her health or new or worsening symptoms          This is a list of Health Maintenance Items that are overdue or due now: Bone densitometry screening Shingrix vaccine - patient declined    Orders/Referrals Placed Today: Orders Placed This Encounter  Procedures   DEXAScan    Standing Status:   Future    Standing Expiration Date:   09/24/2023    Scheduling Instructions:     Please call patient to schedule.    Order Specific Question:   Reason for exam:    Answer:   Post menopausal    Order Specific Question:   Preferred imaging location?    Answer:  [facility]   (Contact our referral department at [phone] if you have not spoken with someone about your referral appointment within the next 5 days)    Follow-up Plan Follow-up with Samuel Bouche, NP as planned Medical advance directive.  Medicare wellness visit in one year.  AVS printed and mailed to patient     Health Maintenance, Female Adopting a healthy lifestyle and getting preventive care are important in promoting health and  wellness. Ask your health care provider about: The right schedule for you to have regular tests and exams. Things you can do on your own to prevent diseases and keep yourself healthy. What should I know about diet, weight, and exercise? Eat a healthy diet  Eat a diet that includes plenty of vegetables, fruits, low-fat dairy products, and lean protein. Do not eat a lot of foods that are high in solid fats, added sugars, or sodium. Maintain a healthy weight Body mass index (BMI) is used to identify weight problems. It estimates body fat based on height and weight. Your health care provider can help determine your BMI and help you achieve or maintain a healthy weight. Get regular exercise Get regular exercise. This is one of the most important things you can do for your health. Most adults should: Exercise for at least 150 minutes each week. The exercise should increase your heart rate and make you sweat (moderate-intensity exercise). Do strengthening exercises at least twice a week. This is in addition to the moderate-intensity exercise. Spend less time sitting. Even light physical activity can be beneficial. Watch cholesterol and blood lipids Have your blood tested for lipids and cholesterol at 73 years of age, then have this test every 5 years. Have your cholesterol levels checked more often if: Your lipid or cholesterol levels are high. You are older than 73 years of age. You are at high risk for heart disease. What should I know about cancer screening? Depending on your health history and family history, you may need to have cancer screening at various ages. This may include screening for: Breast cancer. Cervical cancer. Colorectal cancer. Skin cancer. Lung cancer. What should I know about heart disease, diabetes, and high blood pressure? Blood pressure and heart disease High blood pressure causes heart disease and increases the risk of stroke. This is more likely to develop in people  who have high blood pressure readings or are overweight. Have your blood pressure checked: Every 3-5 years if you are 73-61 years of age. Every year if you are 73 years old or older. Diabetes Have regular diabetes screenings. This checks your fasting blood sugar level. Have the screening done: Once every three years after age 50 if you are at a normal weight and have a low risk for diabetes. More often and at a younger age if you are overweight or have a high risk for diabetes. What should I know about preventing infection? Hepatitis B If you have a higher risk for hepatitis B, you should be screened for this virus. Talk with your health care provider to find out if you are at risk for hepatitis B infection. Hepatitis C Testing is recommended for: Everyone born from [birth year] through 1966. Anyone with known risk factors for hepatitis C. Sexually transmitted infections (STIs) Get screened for STIs, including gonorrhea and chlamydia, if: You are sexually active and are younger than 73 years of age. You are older than 74 years of age and your health care provider tells you that you are at risk for this  type of infection. Your sexual activity has changed since you were last screened, and you are at increased risk for chlamydia or gonorrhea. Ask your health care provider if you are at risk. Ask your health care provider about whether you are at high risk for HIV. Your health care provider may recommend a prescription medicine to help prevent HIV infection. If you choose to take medicine to prevent HIV, you should first get tested for HIV. You should then be tested every 3 months for as long as you are taking the medicine. Pregnancy If you are about to stop having your period (premenopausal) and you may become pregnant, seek counseling before you get pregnant. Take 400 to 800 micrograms (mcg) of folic acid every day if you become pregnant. Ask for birth control (contraception) if you want to prevent  pregnancy. Osteoporosis and menopause Osteoporosis is a disease in which the bones lose minerals and strength with aging. This can result in bone fractures. If you are 26 years old or older, or if you are at risk for osteoporosis and fractures, ask your health care provider if you should: Be screened for bone loss. Take a calcium or vitamin D supplement to lower your risk of fractures. Be given hormone replacement therapy (HRT) to treat symptoms of menopause. Follow these instructions at home: Alcohol use Do not drink alcohol if: Your health care provider tells you not to drink. You are pregnant, may be pregnant, or are planning to become pregnant. If you drink alcohol: Limit how much you have to: 0-1 drink a day. Know how much alcohol is in your drink. In the [place] one drink equals one 12 oz bottle of beer (355 mL), one 5 oz glass of wine (148 mL), or one 1 oz glass of hard liquor (44 mL). Lifestyle Do not use any products that contain nicotine or tobacco. These products include cigarettes, chewing tobacco, and vaping devices, such as e-cigarettes. If you need help quitting, ask your health care provider. Do not use street drugs. Do not share needles. Ask your health care provider for help if you need support or information about quitting drugs. General instructions Schedule regular health, dental, and eye exams. Stay current with your vaccines. Tell your health care provider if: You often feel depressed. You have ever been abused or do not feel safe at home. Summary Adopting a healthy lifestyle and getting preventive care are important in promoting health and wellness. Follow your health care provider's instructions about healthy diet, exercising, and getting tested or screened for diseases. Follow your health care provider's instructions on monitoring your cholesterol and blood pressure. This information is not intended to replace advice given to you by your health care provider.  Make sure you discuss any questions you have with your health care provider. Document Revised: 12/01/2020 Document Reviewed: 12/01/2020 Elsevier Patient Education  [place].

## 2022-09-24 NOTE — Progress Notes (Signed)
Ordered labs per office message.

## 2022-09-25 LAB — MICROALBUMIN / CREATININE URINE RATIO
Creatinine, Urine: 88 mg/dL (ref 20–275)
Microalb Creat Ratio: 9 mcg/mg creat (ref <30)
Microalb, Ur: 0.8 mg/dL

## 2022-10-08 ENCOUNTER — Other Ambulatory Visit: Payer: Self-pay

## 2022-10-08 DIAGNOSIS — E1169 Type 2 diabetes mellitus with other specified complication: Secondary | ICD-10-CM

## 2022-10-08 MED ORDER — TRULICITY 0.75 MG/0.5ML ~~LOC~~ SOAJ: 1.5000 mg | SUBCUTANEOUS | 1 refills | Status: DC

## 2022-10-08 NOTE — Progress Notes (Signed)
Pharmacy is out of her Trulicity for 2 Months and the pharmacy wants you to send in 2 orders of .75 for her to take a one time.

## 2022-10-08 NOTE — Progress Notes (Signed)
Prescription sent for 0.75mg  strength, take two injections weekly.  ___________________________________________ Clearnce Sorrel, DNP, APRN, FNP-BC Primary Care [facility] Sports [facility] [place]

## 2022-10-09 ENCOUNTER — Other Ambulatory Visit: Payer: Self-pay | Admitting: Medical-Surgical

## 2022-10-11 NOTE — Progress Notes (Signed)
Patient informed. She states that the pharmacy still only had one box in stock. She is going to check around to different pharmacies to see what she could do for when the next refill is due.

## 2022-10-11 NOTE — Progress Notes (Signed)
Left message for a return call

## 2022-10-21 ENCOUNTER — Other Ambulatory Visit: Payer: Self-pay | Admitting: Medical-Surgical

## 2022-10-26 ENCOUNTER — Other Ambulatory Visit: Payer: Self-pay | Admitting: Medical-Surgical

## 2022-10-26 DIAGNOSIS — E1169 Type 2 diabetes mellitus with other specified complication: Secondary | ICD-10-CM

## 2022-10-28 ENCOUNTER — Telehealth: Payer: Self-pay | Admitting: Medical-Surgical

## 2022-10-28 NOTE — Telephone Encounter (Signed)
Called and informed patient the scan was ordered and gave her the number to call.

## 2022-10-28 NOTE — Telephone Encounter (Signed)
The order for the bone density scan was placed on 09/24/2022 by myself.  She is welcome to call the imaging department downstairs at [phone] to get that scheduled.

## 2022-10-28 NOTE — Telephone Encounter (Signed)
Pt called requesting a bone density test. Pt stated she already had one ordered but I was unable to find one.

## 2022-11-03 ENCOUNTER — Ambulatory Visit (INDEPENDENT_AMBULATORY_CARE_PROVIDER_SITE_OTHER): Payer: 59

## 2022-11-03 ENCOUNTER — Telehealth: Payer: Self-pay | Admitting: Medical-Surgical

## 2022-11-03 DIAGNOSIS — Z780 Asymptomatic menopausal state: Secondary | ICD-10-CM | POA: Diagnosis not present

## 2022-11-03 DIAGNOSIS — Z0000 Encounter for general adult medical examination without abnormal findings: Secondary | ICD-10-CM

## 2022-11-03 DIAGNOSIS — E119 Type 2 diabetes mellitus without complications: Secondary | ICD-10-CM

## 2022-11-03 NOTE — Telephone Encounter (Signed)
Patient called requested an eye exam referral to another [facility] Location states she doesn't want to go to the one ours that is downstairs

## 2022-11-03 NOTE — Telephone Encounter (Signed)
Referral placed.

## 2022-11-12 ENCOUNTER — Ambulatory Visit (INDEPENDENT_AMBULATORY_CARE_PROVIDER_SITE_OTHER): Payer: 59

## 2022-11-12 ENCOUNTER — Encounter: Payer: Self-pay | Admitting: Medical-Surgical

## 2022-11-12 ENCOUNTER — Ambulatory Visit (INDEPENDENT_AMBULATORY_CARE_PROVIDER_SITE_OTHER): Payer: 59 | Admitting: Medical-Surgical

## 2022-11-12 VITALS — BP 91/56 | HR 91 | Resp 20 | Ht 63.0 in | Wt 169.9 lb

## 2022-11-12 DIAGNOSIS — R109 Unspecified abdominal pain: Secondary | ICD-10-CM

## 2022-11-12 DIAGNOSIS — R350 Frequency of micturition: Secondary | ICD-10-CM

## 2022-11-12 DIAGNOSIS — R1013 Epigastric pain: Secondary | ICD-10-CM | POA: Diagnosis not present

## 2022-11-12 DIAGNOSIS — N1832 Chronic kidney disease, stage 3b: Secondary | ICD-10-CM | POA: Diagnosis not present

## 2022-11-12 LAB — POCT URINALYSIS DIP (CLINITEK)
Bilirubin, UA: NEGATIVE
Glucose, UA: NEGATIVE mg/dL
Ketones, POC UA: NEGATIVE mg/dL
Leukocytes, UA: NEGATIVE
Nitrite, UA: NEGATIVE
POC PROTEIN,UA: 30 — AB
Spec Grav, UA: 1.01 (ref 1.010–1.025)
Urobilinogen, UA: 0.2 E.U./dL
pH, UA: 5.5 (ref 5.0–8.0)

## 2022-11-12 NOTE — Progress Notes (Signed)
        Established patient visit  History, exam, impression, and plan:  1. Left flank pain 2. Increased frequency of urination Pleasant 72 year old female with recent onset of left flank pain and increased urinary frequency while she was out of town in [place]. Admits that she was drinking a lot of sodas while there when the pain developed. No burning or urgency. No hematuria. The pain has improved but is still present. Concerned about her kidney function. On exam, mild bilateral CVA tenderness L > R. POCT UA + trace lysed blood and small amount of protein. Sending for culture. Checking labs as below. Getting KUB for further evaluation.  - CBC with Differential/Platelet - COMPLETE METABOLIC PANEL WITH GFR - DG Abd 1 View; Future - POCT URINALYSIS DIP (CLINITEK) - Urine Culture   - CBC with Differential/Platelet - COMPLETE METABOLIC PANEL WITH GFR - DG Abd 1 View; Future  3. Epigastric pain History of GERD treated with Nexium  daily. Recently, has begun having burning in the epigastric area after eating. Has cut back on hot sauce with some improvement in symptoms. No identifiable relation to food, drink, activity, or time of day. No nausea or vomiting. Increased gassiness and bloating. Stools previously regular but has become looser. Recently had black color but this is now returning to brown. No bright red blood in the toilet or on the tissue. On exam, abdomen soft, nondistended. No epigastric tenderness but reports mild tenderness across the lower quadrants. Bowel sounds + x 4 quadrants, normoactive. Plan to check labs and KUB. Considered H pylori testing but has been on Nexium and would need to stop this for a week. Consider IBS, gastroenteritis, GERD exacerbation. Recommend increasing Nexium to  twice daily for the next two weeks to see if this provides relief. If not, may need further imaging vs referral to GI.  - CBC with Differential/Platelet - COMPLETE METABOLIC PANEL WITH  GFR - DG Abd 1 View; Future   Procedures performed this visit: None.  Return if symptoms worsen or fail to improve.  __________________________________ Thayer Ohm, DNP, APRN, FNP-BC [facility] [place]

## 2022-11-12 NOTE — Patient Instructions (Addendum)
[facility] at [phone]

## 2022-11-13 LAB — CBC WITH DIFFERENTIAL/PLATELET
Absolute Monocytes: 350 cells/uL (ref 200–950)
Basophils Absolute: 40 cells/uL (ref 0–200)
Basophils Relative: 0.8 %
Eosinophils Absolute: 150 cells/uL (ref 15–500)
Eosinophils Relative: 3 %
HCT: 36.2 % (ref 35.0–45.0)
Hemoglobin: 11.6 g/dL — ABNORMAL LOW (ref 11.7–15.5)
Lymphs Abs: 1800 cells/uL (ref 850–3900)
MCH: 26.7 pg — ABNORMAL LOW (ref 27.0–33.0)
MCHC: 32 g/dL (ref 32.0–36.0)
MCV: 83.4 fL (ref 80.0–100.0)
MPV: 11.1 fL (ref 7.5–12.5)
Monocytes Relative: 7 %
Neutro Abs: 2660 cells/uL (ref 1500–7800)
Neutrophils Relative %: 53.2 %
Platelets: 266 10*3/uL (ref 140–400)
RBC: 4.34 10*6/uL (ref 3.80–5.10)
RDW: 14.9 % (ref 11.0–15.0)
Total Lymphocyte: 36 %
WBC: 5 10*3/uL (ref 3.8–10.8)

## 2022-11-13 LAB — COMPLETE METABOLIC PANEL WITH GFR
AG Ratio: 1.5 (calc) (ref 1.0–2.5)
ALT: 22 U/L (ref 6–29)
AST: 22 U/L (ref 10–35)
Albumin: 4.1 g/dL (ref 3.6–5.1)
Alkaline phosphatase (APISO): 57 U/L (ref 37–153)
BUN/Creatinine Ratio: 15 (calc) (ref 6–22)
BUN: 22 mg/dL (ref 7–25)
CO2: 29 mmol/L (ref 20–32)
Calcium: 10 mg/dL (ref 8.6–10.4)
Chloride: 102 mmol/L (ref 98–110)
Creat: 1.46 mg/dL — ABNORMAL HIGH (ref 0.60–1.00)
Globulin: 2.8 g/dL (calc) (ref 1.9–3.7)
Glucose, Bld: 159 mg/dL — ABNORMAL HIGH (ref 65–99)
Potassium: 3.4 mmol/L — ABNORMAL LOW (ref 3.5–5.3)
Sodium: 143 mmol/L (ref 135–146)
Total Bilirubin: 0.3 mg/dL (ref 0.2–1.2)
Total Protein: 6.9 g/dL (ref 6.1–8.1)
eGFR: 38 mL/min/{1.73_m2} — ABNORMAL LOW (ref >=60)

## 2022-11-13 LAB — URINE CULTURE
MICRO NUMBER:: 14849138
SPECIMEN QUALITY:: ADEQUATE

## 2022-11-18 ENCOUNTER — Other Ambulatory Visit: Payer: Self-pay

## 2022-11-18 ENCOUNTER — Other Ambulatory Visit: Payer: Self-pay | Admitting: Medical-Surgical

## 2022-11-18 DIAGNOSIS — E119 Type 2 diabetes mellitus without complications: Secondary | ICD-10-CM

## 2022-11-18 DIAGNOSIS — K219 Gastro-esophageal reflux disease without esophagitis: Secondary | ICD-10-CM

## 2022-11-18 MED ORDER — DAPAGLIFLOZIN PROPANEDIOL 5 MG PO TABS: 5.0000 mg | ORAL_TABLET | Freq: Every day | ORAL | 1 refills | Status: DC

## 2022-11-18 NOTE — Addendum Note (Signed)
Addended byChristen Butter on: 11/18/2022 12:20 PM   Modules accepted: Orders

## 2022-11-18 NOTE — Addendum Note (Signed)
Addended byChristen Butter on: 11/18/2022 02:21 PM   Modules accepted: Orders

## 2022-11-24 ENCOUNTER — Other Ambulatory Visit: Payer: Self-pay | Admitting: Medical-Surgical

## 2022-11-26 ENCOUNTER — Encounter (INDEPENDENT_AMBULATORY_CARE_PROVIDER_SITE_OTHER): Payer: Self-pay | Admitting: Ophthalmology

## 2022-11-26 ENCOUNTER — Ambulatory Visit (INDEPENDENT_AMBULATORY_CARE_PROVIDER_SITE_OTHER): Payer: 59 | Admitting: Ophthalmology

## 2022-11-26 DIAGNOSIS — Z7984 Long term (current) use of oral hypoglycemic drugs: Secondary | ICD-10-CM | POA: Diagnosis not present

## 2022-11-26 DIAGNOSIS — H35033 Hypertensive retinopathy, bilateral: Secondary | ICD-10-CM | POA: Diagnosis not present

## 2022-11-26 DIAGNOSIS — E119 Type 2 diabetes mellitus without complications: Secondary | ICD-10-CM

## 2022-11-26 DIAGNOSIS — I10 Essential (primary) hypertension: Secondary | ICD-10-CM | POA: Diagnosis not present

## 2022-11-26 DIAGNOSIS — Z961 Presence of intraocular lens: Secondary | ICD-10-CM

## 2022-11-26 DIAGNOSIS — H3581 Retinal edema: Secondary | ICD-10-CM

## 2022-11-26 DIAGNOSIS — Z7985 Long-term (current) use of injectable non-insulin antidiabetic drugs: Secondary | ICD-10-CM

## 2022-11-26 NOTE — Progress Notes (Signed)
[facility] - Clinic Note  11/26/2022   CHIEF COMPLAINT Patient presents for Retina Evaluation  HISTORY OF PRESENT ILLNESS: Sara Gardner is a 72 y.o. female who presents to the clinic today for:  HPI     Retina Evaluation   In both eyes.  Duration of 10 years.  Associated Symptoms Negative for Flashes, Floaters, Distortion and Blind Spot.  Context:  distance vision.  I, the attending physician,  performed the HPI with the patient and updated documentation appropriately.        Comments   Patient is here today based on a referral from Dr. Larinda Buttery for a diabetic evaluation. She noticed that the vision in the left eye is not as good. She does not check her sugar daily.       Last edited by Rennis Chris, MD on 11/29/2022 12:48 AM.    Pt is here on the referral of Asencion Islam, NP, for DM exam, pt states she has never had a diabetic eye exam, she last saw an eye dr for glasses about a year ago (can't remember the name, but he's in [place]), pt is on Trulicity, metformin and farxiga, her A1c was 7.5 on 02.06.24, pt states she has been diabetic for 15-20 years  Referring physician: Christen Butter, NP [address] [place],  [place] [address]  HISTORICAL INFORMATION:  Selected notes from the MEDICAL RECORD NUMBER Referred by Christen Butter, NP for DM exam LEE:  Ocular Hx- PMH-   CURRENT MEDICATIONS: No current outpatient medications on file. (Ophthalmic Drugs)   No current facility-administered medications for this visit. (Ophthalmic Drugs)   Current Outpatient Medications (Other)  Medication Sig   Accu-Chek Softclix Lancets lancets Use as instructed. Accu-chek Guide   AMBULATORY NON FORMULARY MEDICATION Knee-high, medium compression, graduated compression stockings. Apply to lower extremities. [email or web address], Zippered Compression Stockings, medium circ, long length   ammonium lactate (LAC-HYDRIN) 12 % lotion APPLY TOPICALLY TO THE  AFFECTED AREA AS NEEDED FOR DRY SKIN   ASPIRIN PO Take 81 mg by mouth daily.   blood glucose meter kit and supplies Dispense based on patient and insurance preference. Check daily. (FOR ICD-10 E10.9, E11.9).   dapagliflozin propanediol (FARXIGA) 5 MG TABS tablet Take 1 tablet (5 mg total) by mouth daily.   DICLOFENAC SODIUM EX Apply topically as needed.   Dulaglutide (TRULICITY) 0.75 MG/0.5ML SOPN Inject 1.5 mg into the skin once a week.   esomeprazole (NEXIUM) 20 MG capsule TAKE 1 CAPSULE BY MOUTH DAILY AT 12:00 NOON   fluticasone (FLONASE) 50 MCG/ACT nasal spray Place into both nostrils as needed for allergies or rhinitis.   glucose blood (ACCU-CHEK GUIDE) test strip USE AS INSTRUCTED TO TEST ONCE DAILY   isosorbide mononitrate (IMDUR) 60 MG 24 hr tablet Take 1 tablet (60 mg total) by mouth daily.   levothyroxine (SYNTHROID) 75 MCG tablet TAKE 1 TABLET(75 MCG) BY MOUTH DAILY BEFORE BREAKFAST   lisinopril-hydrochlorothiazide (ZESTORETIC) 20-12.5 MG tablet Take 1 tablet by mouth daily.   metFORMIN (GLUCOPHAGE) 1000 MG tablet TAKE 1 TABLET(1000 MG) BY MOUTH EVERY MORNING   metoprolol succinate (TOPROL-XL) 50 MG 24 hr tablet TAKE 1 TABLET(50 MG) BY MOUTH DAILY WITH OR IMMEDIATELY FOLLOWING A MEAL   nitroGLYCERIN (NITROSTAT) 0.4 MG SL tablet DISSOLVE 1 TABLET UNDER THE TONGUE EVERY 5 MINUTES, AS NEEDED FOR CHEST PAIN. MAX 3 TABLETS IN 15 MINUTES   rosuvastatin (CRESTOR) 40 MG tablet TAKE 1 TABLET(40 MG) BY MOUTH AT BEDTIME  triamcinolone cream (KENALOG) 0.1 %    VITAMIN D PO Take by mouth.   No current facility-administered medications for this visit. (Other)   REVIEW OF SYSTEMS: ROS   Positive for: Gastrointestinal, Endocrine, Eyes Last edited by Julieanne Cotton, COT on 11/26/2022  8:34 AM.     ALLERGIES Allergies  Allergen Reactions   Bee Venom Swelling   Tuberculin Tests Other (See Comments)   Tape Rash   PAST MEDICAL HISTORY Past Medical History:  Diagnosis Date   Aortic  atherosclerosis (HCC) 06/10/2017   CAD (coronary artery disease)    Cancer (HCC)    Combined form of age-related cataract, both eyes 04/07/2017   Dermatochalasis of both eyelids 04/07/2017   Diabetes mellitus without complication (HCC)    GERD (gastroesophageal reflux disease)    Hypertension    Hypothyroidism    Lacunar infarction (HCC) 07/20/2018   Left renal mass 10/04/2018   1.7 cm hypoechoic mass 10/04/2018   Lumbar degenerative disc disease 03/09/2017   Myocardial infarction (HCC) 2005   Normocytic anemia 12/14/2018   Palpitations    Papillary thyroid carcinoma (HCC)    Posterior vitreous detachment, right eye 04/07/2017   Past Surgical History:  Procedure Laterality Date   TOTAL THYROIDECTOMY  2016   FAMILY HISTORY Family History  Problem Relation Age of Onset   Hypertension Mother    Hypertension Sister    Hypertension Brother    Breast cancer Daughter    Hypertension Sister    SOCIAL HISTORY Social History   Tobacco Use   Smoking status: Former   Smokeless tobacco: Never  Building services engineer Use: Never used  Substance Use Topics   Alcohol use: No   Drug use: No       OPHTHALMIC EXAM:  Base Eye Exam     Visual Acuity (Snellen - Linear)       Right Left   Dist [place] 20/25 +1 20/20 +2   Dist ph [place] NI          Tonometry (Tonopen, 8:39 AM)       Right Left   Pressure 12 14         Pupils       Dark Light Shape React APD   Right 4 3 Round Brisk None   Left 4 3 Round Brisk None         Visual Fields       Left Right    Full Full         Extraocular Movement       Right Left    Full, Ortho Full, Ortho         Neuro/Psych     Oriented x3: Yes         Dilation     Both eyes: 1.0% Mydriacyl, 2.5% Phenylephrine @ 8:35 AM           Slit Lamp and Fundus Exam     Slit Lamp Exam       Right Left   Lids/Lashes Dermatochalasis - upper lid Dermatochalasis - upper lid   Conjunctiva/Sclera mild melanosis mild melanosis    Cornea well healed cataract wound, arcus arcus, 1-2+ inferior Punctate epithelial erosions, well healed cataract wound   Anterior Chamber deep and clear deep and clear   Iris Round and dilated Round and dilated   Lens PC IOL in good position PC IOL in good position   Anterior Vitreous mild syneresis mild syneresis  Fundus Exam       Right Left   Disc Pink and Sharp, mild PPA Pink and Sharp   C/D Ratio 0.1 0.2   Macula Flat, Good foveal reflex, RPE mottling, No heme or edema Flat, Good foveal reflex, RPE mottling, No heme or edema   Vessels mild attenuation, mild tortuosity mild attenuation, mild tortuosity   Periphery Attached, mild midzonal drusen, No heme Attached, mild midzonal drusen, No heme           IMAGING AND PROCEDURES  Imaging and Procedures for 11/26/2022  OCT, Retina - OU - Both Eyes       Right Eye Quality was good. Central Foveal Thickness: 283. Progression has no prior data. Findings include normal foveal contour, no IRF, no SRF.   Left Eye Quality was good. Central Foveal Thickness: 281. Progression has no prior data. Findings include normal foveal contour, no IRF, no SRF.   Notes *Images captured and stored on drive  Diagnosis / Impression:  NFP, no IRF/SRF OU No DME OU  Clinical management:  See below  Abbreviations: NFP - Normal foveal profile. CME - cystoid macular edema. PED - pigment epithelial detachment. IRF - intraretinal fluid. SRF - subretinal fluid. EZ - ellipsoid zone. ERM - epiretinal membrane. ORA - outer retinal atrophy. ORT - outer retinal tubulation. SRHM - subretinal hyper-reflective material. IRHM - intraretinal hyper-reflective material           ASSESSMENT/PLAN:   ICD-10-CM   1. 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 Long term (current) use of oral hypoglycemic drugs  Z79.84     3. Long-term (current) use of injectable non-insulin antidiabetic drugs  Z79.85     4.  Hypertensive retinopathy of both eyes  H35.033     5. Essential hypertension  I10     6. Pseudophakia, both eyes  Z96.1      1-3. Diabetes mellitus, type 2 without retinopathy - The incidence, risk factors for progression, natural history and treatment options for diabetic retinopathy  were discussed with patient.   - The need for close monitoring of blood glucose, blood pressure, and serum lipids, avoiding cigarette or any type of tobacco, and the need for long term follow up was also discussed with patient. - f/u in 1 year, sooner prn  4,5. Hypertensive retinopathy OU - discussed importance of tight BP control - monitor  6. Pseudophakia OU  - s/p CE/IOL OU  - IOL in good position, doing well  - monitor    Ophthalmic Meds Ordered this visit:  No orders of the defined types were placed in this encounter.    Return in about 1 year (around 11/26/2023) for f/u DM exam, DFE, OCT.  There are no Patient Instructions on file for this visit.  Explained the diagnoses, plan, and follow up with the patient and they expressed understanding.  Patient expressed understanding of the importance of proper follow up care.   This document serves as a record of services personally performed by Karie Chimera, MD, PhD. It was created on their behalf by Glee Arvin. Manson Passey, OA an ophthalmic technician. The creation of this record is the provider's dictation and/or activities during the visit.    Electronically signed by: Glee Arvin. Manson Passey, [place] 05.03.2024 12:49 AM   Karie Chimera, M.D., Ph.D. Diseases & Surgery of the Retina and Vitreous [facility] 11/26/2022  I have reviewed the above  documentation for accuracy and completeness, and I agree with the above. Karie Chimera, M.D., Ph.D. 11/29/22 12:51 AM   Abbreviations: M myopia (nearsighted); A astigmatism; H hyperopia (farsighted); P presbyopia; Mrx spectacle prescription;  CTL contact lenses; OD right eye; OS left eye; OU  both eyes  XT exotropia; ET esotropia; PEK punctate epithelial keratitis; PEE punctate epithelial erosions; DES dry eye syndrome; MGD meibomian gland dysfunction; ATs artificial tears; PFAT's preservative free artificial tears; NSC nuclear sclerotic cataract; PSC posterior subcapsular cataract; ERM epi-retinal membrane; PVD posterior vitreous detachment; RD retinal detachment; DM diabetes mellitus; DR diabetic retinopathy; NPDR non-proliferative diabetic retinopathy; PDR proliferative diabetic retinopathy; CSME clinically significant macular edema; DME diabetic macular edema; dbh dot blot hemorrhages; CWS cotton wool spot; POAG primary open angle glaucoma; C/D cup-to-disc ratio; HVF humphrey visual field; GVF goldmann visual field; OCT optical coherence tomography; IOP intraocular pressure; BRVO Branch retinal vein occlusion; CRVO central retinal vein occlusion; CRAO central retinal artery occlusion; BRAO branch retinal artery occlusion; RT retinal tear; SB scleral buckle; PPV pars plana vitrectomy; VH Vitreous hemorrhage; PRP panretinal laser photocoagulation; IVK intravitreal kenalog; VMT vitreomacular traction; MH Macular hole;  NVD neovascularization of the disc; NVE neovascularization elsewhere; AREDS age related eye disease study; ARMD age related macular degeneration; POAG primary open angle glaucoma; EBMD epithelial/anterior basement membrane dystrophy; ACIOL anterior chamber intraocular lens; IOL intraocular lens; PCIOL posterior chamber intraocular lens; Phaco/IOL phacoemulsification with intraocular lens placement; PRK photorefractive keratectomy; LASIK laser assisted in situ keratomileusis; HTN hypertension; DM diabetes mellitus; COPD chronic obstructive pulmonary disease

## 2022-12-21 ENCOUNTER — Ambulatory Visit
Admission: EM | Admit: 2022-12-21 | Discharge: 2022-12-21 | Disposition: A | Discharge status: Home / Self Care | Payer: 59

## 2022-12-21 DIAGNOSIS — K219 Gastro-esophageal reflux disease without esophagitis: Secondary | ICD-10-CM

## 2022-12-21 NOTE — Discharge Instructions (Addendum)
Advised patient to take Nexium with 8 ounces of water and no other medications foods or beverages for 45 minutes after taking this medication.  Advised if symptoms worsen and or unresolved please follow-up with PCP or here for further evaluation.

## 2022-12-21 NOTE — ED Provider Notes (Signed)
[facility]    CSN: [id number] Arrival date & time: 12/21/22  1753      History   Chief Complaint Chief Complaint  Patient presents with   Chest Pain   Shoulder Pain    HPI Sara Gardner is a 72 y.o. female.   HPI 72 year old female presents with soreness to her chest and shoulders since this afternoon.  Patient reports pain worsens with touch. PMH CAD s/p MI, HTN, thyroid cancer, and T2DM.  Past Medical History:  Diagnosis Date   Aortic atherosclerosis (HCC) 06/10/2017   CAD (coronary artery disease)    Cancer (HCC)    Combined form of age-related cataract, both eyes 04/07/2017   Dermatochalasis of both eyelids 04/07/2017   Diabetes mellitus without complication (HCC)    GERD (gastroesophageal reflux disease)    Hypertension    Hypothyroidism    Lacunar infarction (HCC) 07/20/2018   Left renal mass 10/04/2018   1.7 cm hypoechoic mass 10/04/2018   Lumbar degenerative disc disease 03/09/2017   Myocardial infarction (HCC) 2005   Normocytic anemia 12/14/2018   Palpitations    Papillary thyroid carcinoma (HCC)    Posterior vitreous detachment, right eye 04/07/2017    Patient Active Problem List   Diagnosis Date Noted   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/05/2019   Cervicalgia 04/05/2019   Eustachian tube dysfunction, bilateral 04/05/2019   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   White matter abnormality on MRI of brain 04/05/2019   Myalgia due to statin 12/14/2018   Normocytic anemia 12/14/2018   At moderate risk for fall 10/03/2018   Bilateral renal cysts 10/03/2018   Facet arthropathy, lumbosacral 10/03/2018   Lacunar infarction (HCC) 07/20/2018   Controlled type 2 diabetes mellitus without complication, without long-term current use of insulin (HCC) 03/08/2018   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/23/2017   Overactive bladder 08/17/2017   Equinus contracture of ankle 07/27/2017   Hammer toes of both feet 07/27/2017    Dermatophytosis, nail 07/27/2017   Calcaneal spur of both feet 07/27/2017   Aortic atherosclerosis (HCC) 06/10/2017   Combined form of age-related cataract, both eyes 04/07/2017   Dermatochalasis of both eyelids 04/07/2017   Posterior vitreous detachment, right eye 04/07/2017   Postural kyphosis of cervicothoracic region 03/09/2017   Lumbar degenerative disc disease 03/09/2017   Bilateral primary osteoarthritis of knee 12/15/2016   Allergy to honey bee venom 12/15/2016   Hypertension 02/16/2016   DM type 2 with diabetic dyslipidemia (HCC) 02/10/2016   Gastroesophageal reflux disease without esophagitis 02/10/2016   Left ventricular diastolic dysfunction, NYHA class 1 01/28/2016   Hypothyroidism, 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   Urticaria 12/30/2015   Diabetic sensorimotor polyneuropathy (HCC) 10/27/2015   Low back strain 10/27/2015   Right inguinal pain 04/21/2015   Healthcare maintenance 12/30/2014   Skin rash 09/16/2014   Spider veins of both lower extremities 09/16/2014   Thyroid cancer (HCC) 08/17/2014   Papillary thyroid carcinoma (HCC) 08/09/2014   Post-menopausal atrophic vaginitis 06/24/2014   Nodule of left lung 01/21/2014   Class 1 obesity due to excess calories with serious comorbidity and body mass index (BMI) of 30.0 to 30.9 in adult 06/12/2012   Palpitations 11/29/2011   Cutaneous horn 09/29/2011   Abnormal mammogram of right breast 08/30/2011   Arthritis of left knee 05/24/2011   CAD (coronary artery disease) 05/24/2011   History of acute myocardial infarction of inferior wall 05/24/2011   Gastro-esophageal reflux disease with esophagitis 05/24/2011   Hyperlipidemia 05/24/2011    Past Surgical History:  Procedure  Laterality Date   TOTAL THYROIDECTOMY  2016    OB History   No obstetric history on file.      Home Medications    Prior to Admission medications   Medication Sig Start Date End Date Taking? Authorizing Provider  Accu-Chek Softclix Lancets lancets  Use as instructed. Accu-chek Guide 10/23/21   Christen Butter, NP  AMBULATORY NON FORMULARY MEDICATION Knee-high, medium compression, graduated compression stockings. Apply to lower extremities. [email or web address], Zippered Compression Stockings, medium circ, long length 12/15/16   Carlis Stable, PA-C  ammonium lactate (LAC-HYDRIN) 12 % lotion APPLY TOPICALLY TO THE AFFECTED AREA AS NEEDED FOR DRY SKIN 02/10/22   Christen Butter, NP  ASPIRIN PO Take 81 mg by mouth daily.    [provider]  blood glucose meter kit and supplies Dispense based on patient and insurance preference. Check daily. (FOR ICD-10 E10.9, E11.9). 10/23/21   Christen Butter, NP  dapagliflozin propanediol (FARXIGA) 5 MG TABS tablet Take 1 tablet (5 mg total) by mouth daily. 11/18/22   Christen Butter, NP  DICLOFENAC SODIUM EX Apply topically as needed.    [provider]  Dulaglutide (TRULICITY) 0.75 MG/0.5ML SOPN Inject 1.5 mg into the skin once a week. 10/08/22   Christen Butter, NP  esomeprazole (NEXIUM) 20 MG capsule TAKE 1 CAPSULE BY MOUTH DAILY AT 12:00 NOON 11/18/22   Christen Butter, NP  fluticasone (FLONASE) 50 MCG/ACT nasal spray Place into both nostrils as needed for allergies or rhinitis.    [provider]  glucose blood (ACCU-CHEK GUIDE) test strip USE AS INSTRUCTED TO TEST ONCE DAILY 11/25/22   Christen Butter, NP  isosorbide mononitrate (IMDUR) 60 MG 24 hr tablet Take 1 tablet (60 mg total) by mouth daily. 05/21/22   Lewayne Bunting, MD  levothyroxine (SYNTHROID) 75 MCG tablet TAKE 1 TABLET(75 MCG) BY MOUTH DAILY BEFORE BREAKFAST 10/25/22   Christen Butter, NP  lisinopril-hydrochlorothiazide (ZESTORETIC) 20-12.5 MG tablet Take 1 tablet by mouth daily. 05/21/22   Lewayne Bunting, MD  metFORMIN (GLUCOPHAGE) 1000 MG tablet TAKE 1 TABLET(1000 MG) BY MOUTH EVERY MORNING 06/11/22   Christen Butter, NP  metoprolol succinate (TOPROL-XL) 50 MG 24 hr tablet TAKE 1 TABLET(50 MG) BY MOUTH DAILY WITH OR IMMEDIATELY FOLLOWING A  MEAL 05/21/22   Crenshaw, Madolyn Frieze, MD  nitroGLYCERIN (NITROSTAT) 0.4 MG SL tablet DISSOLVE 1 TABLET UNDER THE TONGUE EVERY 5 MINUTES, AS NEEDED FOR CHEST PAIN. MAX 3 TABLETS IN 15 MINUTES 09/07/21   Lewayne Bunting, MD  rosuvastatin (CRESTOR) 40 MG tablet TAKE 1 TABLET(40 MG) BY MOUTH AT BEDTIME 10/26/22   Christen Butter, NP  triamcinolone cream (KENALOG) 0.1 %  10/21/15   [provider]  VITAMIN D PO Take by mouth.    [provider]    Family History Family History  Problem Relation Age of Onset   Hypertension Mother    Hypertension Sister    Hypertension Brother    Breast cancer Daughter    Hypertension Sister     Social History Social History   Tobacco Use   Smoking status: Former   Smokeless tobacco: Never  Building services engineer Use: Never used  Substance Use Topics   Alcohol use: No   Drug use: No     Allergies   Bee venom, Tuberculin tests, and Tape   Review of Systems Review of Systems  Gastrointestinal:        Epigastric pain for 2 to 3 hours  All other systems reviewed and  are negative.    Physical Exam Triage Vital Signs ED Triage Vitals  Enc Vitals Group     BP      Pulse      Resp      Temp      Temp src      SpO2      Weight      Height      Head Circumference      Peak Flow      Pain Score      Pain Loc      Pain Edu?      Excl. in GC?    No data found.  Updated Vital Signs BP 132/81   Temp 98.2 F (36.8 C) (Oral)   Resp 16   SpO2 94%       Physical Exam Vitals and nursing note reviewed.  Constitutional:      Appearance: Normal appearance. She is normal weight.  HENT:     Head: Normocephalic and atraumatic.     Mouth/Throat:     Mouth: Mucous membranes are moist.     Pharynx: Oropharynx is clear.  Eyes:     Extraocular Movements: Extraocular movements intact.     Conjunctiva/sclera: Conjunctivae normal.     Pupils: Pupils are equal, round, and reactive to light.  Cardiovascular:     Rate and Rhythm:  Normal rate and regular rhythm.     Pulses: Normal pulses.     Heart sounds: Normal heart sounds.  Pulmonary:     Effort: Pulmonary effort is normal.     Breath sounds: Normal breath sounds. No wheezing, rhonchi or rales.  Musculoskeletal:        General: Normal range of motion.     Cervical back: Normal range of motion and neck supple.  Skin:    General: Skin is warm and dry.  Neurological:     General: No focal deficit present.     Mental Status: She is alert and oriented to person, place, and time. Mental status is at baseline.      UC Treatments / Results  Labs (all labs ordered are listed, but only abnormal results are displayed) Labs Reviewed - No data to display  EKG   Radiology No results found.  Procedures Procedures (including critical care time)  Medications Ordered in UC Medications - No data to display  Initial Impression / Assessment and Plan / UC Course  I have reviewed the triage vital signs and the nursing notes.  Pertinent labs & imaging results that were available during my care of the patient were reviewed by me and considered in my medical decision making (see chart for details).     MDM: 1.  Gastroesophageal reflux disease, unspecified whether esophagitis present-advised patient to continue Nexium as prescribed.  Patient reports taking thyroid medication 1 hour prior to any other medications; however, reports taking Nexium with and metformin on a daily basis.  Advised patient to take Nexium with 8 ounces of water and no other medications foods or beverages for 45 minutes after taking this medication.  Advised if symptoms worsen and or unresolved please follow-up with PCP or here for further evaluation. Final Clinical Impressions(s) / UC Diagnoses   Final diagnoses:  Gastroesophageal reflux disease, unspecified whether esophagitis present     Discharge Instructions      Advised patient to take Nexium with 8 ounces of water and no other  medications foods or beverages for 45 minutes after taking this medication.  Advised if symptoms worsen and or unresolved please follow-up with PCP or here for further evaluation.     ED Prescriptions   None    PDMP not reviewed this encounter.   Trevor Iha, FNP 12/21/22 [phone]

## 2022-12-21 NOTE — ED Triage Notes (Signed)
Pt presents to uc with soreness to her chest and shoulders since this afternoon. Pt reports pain worsens with touch.

## 2022-12-23 ENCOUNTER — Telehealth: Payer: Self-pay | Admitting: Medical-Surgical

## 2022-12-23 ENCOUNTER — Other Ambulatory Visit: Payer: Self-pay | Admitting: Medical-Surgical

## 2022-12-23 MED ORDER — METFORMIN HCL 1000 MG PO TABS: ORAL_TABLET | ORAL | 1 refills | Status: DC

## 2022-12-23 NOTE — Telephone Encounter (Signed)
Patient called to ask why her Metformin 1000 mg was denied please advise Does patient need an appointment ?

## 2022-12-29 DIAGNOSIS — N1831 Chronic kidney disease, stage 3a: Secondary | ICD-10-CM | POA: Diagnosis not present

## 2023-01-21 ENCOUNTER — Telehealth: Payer: Self-pay | Admitting: Medical-Surgical

## 2023-01-21 ENCOUNTER — Other Ambulatory Visit: Payer: Self-pay | Admitting: Medical-Surgical

## 2023-01-21 NOTE — Telephone Encounter (Signed)
Patient called about medication resolved by pharmacy

## 2023-01-24 DIAGNOSIS — E890 Postprocedural hypothyroidism: Secondary | ICD-10-CM | POA: Diagnosis not present

## 2023-01-24 DIAGNOSIS — C73 Malignant neoplasm of thyroid gland: Secondary | ICD-10-CM | POA: Diagnosis not present

## 2023-01-31 DIAGNOSIS — E890 Postprocedural hypothyroidism: Secondary | ICD-10-CM | POA: Diagnosis not present

## 2023-01-31 DIAGNOSIS — C73 Malignant neoplasm of thyroid gland: Secondary | ICD-10-CM | POA: Diagnosis not present

## 2023-01-31 DIAGNOSIS — E1142 Type 2 diabetes mellitus with diabetic polyneuropathy: Secondary | ICD-10-CM | POA: Diagnosis not present

## 2023-02-12 DIAGNOSIS — E119 Type 2 diabetes mellitus without complications: Secondary | ICD-10-CM | POA: Diagnosis not present

## 2023-02-17 ENCOUNTER — Other Ambulatory Visit: Payer: Self-pay | Admitting: Medical-Surgical

## 2023-02-17 DIAGNOSIS — E1169 Type 2 diabetes mellitus with other specified complication: Secondary | ICD-10-CM

## 2023-03-19 ENCOUNTER — Other Ambulatory Visit: Payer: Self-pay | Admitting: Medical-Surgical

## 2023-03-21 NOTE — Telephone Encounter (Signed)
Overdue for follow up. Needs appt. 15 day supply sent.

## 2023-04-04 DIAGNOSIS — E890 Postprocedural hypothyroidism: Secondary | ICD-10-CM | POA: Diagnosis not present

## 2023-04-04 DIAGNOSIS — C73 Malignant neoplasm of thyroid gland: Secondary | ICD-10-CM | POA: Diagnosis not present

## 2023-04-07 ENCOUNTER — Other Ambulatory Visit: Payer: Self-pay

## 2023-04-07 ENCOUNTER — Emergency Department (HOSPITAL_BASED_OUTPATIENT_CLINIC_OR_DEPARTMENT_OTHER)
Admission: EM | Admit: 2023-04-07 | Discharge: 2023-04-07 | Discharge status: Against Medical Advice | Payer: 59 | Attending: Emergency Medicine | Admitting: Emergency Medicine

## 2023-04-07 ENCOUNTER — Emergency Department (HOSPITAL_BASED_OUTPATIENT_CLINIC_OR_DEPARTMENT_OTHER): Payer: 59

## 2023-04-07 ENCOUNTER — Ambulatory Visit
Admission: EM | Admit: 2023-04-07 | Discharge: 2023-04-07 | Disposition: A | Discharge status: Home / Self Care | Payer: 59

## 2023-04-07 ENCOUNTER — Encounter (HOSPITAL_BASED_OUTPATIENT_CLINIC_OR_DEPARTMENT_OTHER): Payer: Self-pay | Admitting: Pediatrics

## 2023-04-07 DIAGNOSIS — M7989 Other specified soft tissue disorders: Secondary | ICD-10-CM

## 2023-04-07 DIAGNOSIS — M79604 Pain in right leg: Secondary | ICD-10-CM

## 2023-04-07 DIAGNOSIS — M79605 Pain in left leg: Secondary | ICD-10-CM | POA: Diagnosis not present

## 2023-04-07 DIAGNOSIS — Z5321 Procedure and treatment not carried out due to patient leaving prior to being seen by health care provider: Secondary | ICD-10-CM | POA: Diagnosis not present

## 2023-04-07 DIAGNOSIS — M79661 Pain in right lower leg: Secondary | ICD-10-CM | POA: Diagnosis not present

## 2023-04-07 NOTE — Discharge Instructions (Addendum)
Advised patient to go to [facility] ED now for further evaluation of pain and swelling of right lower leg and to rule out deep vein thrombosis by ultrasound.

## 2023-04-07 NOTE — ED Triage Notes (Signed)
Pt c/o RT leg pain x 2 wks. Denies injury.

## 2023-04-07 NOTE — ED Notes (Signed)
Patient is being discharged from the Urgent Care and sent to the Emergency Department via POV. Per Trevor Iha FNP, patient is in need of higher level of care due to need for [place] to r/o DVT. Patient is aware and verbalizes understanding of plan of care.  Vitals:   04/07/23 1752  BP: 114/72  Pulse: 69  Resp: 17  Temp: 97.9 F (36.6 C)  SpO2: 94%

## 2023-04-07 NOTE — ED Triage Notes (Signed)
C/O right sided leg pain x 2 weeks, concern blood clots, stated left leg started having pain as well and would like it checked out too.

## 2023-04-07 NOTE — ED Provider Notes (Signed)
[facility]    CSN: [id number] Arrival date & time: 04/07/23  1743      History   Chief Complaint Chief Complaint  Patient presents with   Leg Pain    RT    HPI Sara Gardner is a 71 y.o. female.   HPI Pleasant 72 year old female presents with right leg pain, reports hot to touch.  PMH significant for CAD (S/P MI), papillary thyroid carcinoma, and T2DM.  Past Medical History:  Diagnosis Date   Aortic atherosclerosis (HCC) 06/10/2017   CAD (coronary artery disease)    Cancer (HCC)    Combined form of age-related cataract, both eyes 04/07/2017   Dermatochalasis of both eyelids 04/07/2017   Diabetes mellitus without complication (HCC)    GERD (gastroesophageal reflux disease)    Hypertension    Hypothyroidism    Lacunar infarction (HCC) 07/20/2018   Left renal mass 10/04/2018   1.7 cm hypoechoic mass 10/04/2018   Lumbar degenerative disc disease 03/09/2017   Myocardial infarction (HCC) 2005   Normocytic anemia 12/14/2018   Palpitations    Papillary thyroid carcinoma (HCC)    Posterior vitreous detachment, right eye 04/07/2017    Patient Active Problem List   Diagnosis Date Noted   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/05/2019   Cervicalgia 04/05/2019   Eustachian tube dysfunction, bilateral 04/05/2019   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   White matter abnormality on MRI of brain 04/05/2019   Myalgia due to statin 12/14/2018   Normocytic anemia 12/14/2018   At moderate risk for fall 10/03/2018   Bilateral renal cysts 10/03/2018   Facet arthropathy, lumbosacral 10/03/2018   Lacunar infarction (HCC) 07/20/2018   Controlled type 2 diabetes mellitus without complication, without long-term current use of insulin (HCC) 03/08/2018   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/23/2017   Overactive bladder 08/17/2017   Equinus contracture of ankle 07/27/2017   Hammer toes of both feet 07/27/2017   Dermatophytosis, nail 07/27/2017    Calcaneal spur of both feet 07/27/2017   Aortic atherosclerosis (HCC) 06/10/2017   Combined form of age-related cataract, both eyes 04/07/2017   Dermatochalasis of both eyelids 04/07/2017   Posterior vitreous detachment, right eye 04/07/2017   Postural kyphosis of cervicothoracic region 03/09/2017   Lumbar degenerative disc disease 03/09/2017   Bilateral primary osteoarthritis of knee 12/15/2016   Allergy to honey bee venom 12/15/2016   Hypertension 02/16/2016   DM type 2 with diabetic dyslipidemia (HCC) 02/10/2016   Gastroesophageal reflux disease without esophagitis 02/10/2016   Left ventricular diastolic dysfunction, NYHA class 1 01/28/2016   Hypothyroidism, 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   Urticaria 12/30/2015   Diabetic sensorimotor polyneuropathy (HCC) 10/27/2015   Low back strain 10/27/2015   Right inguinal pain 04/21/2015   Healthcare maintenance 12/30/2014   Skin rash 09/16/2014   Spider veins of both lower extremities 09/16/2014   Thyroid cancer (HCC) 08/17/2014   Papillary thyroid carcinoma (HCC) 08/09/2014   Post-menopausal atrophic vaginitis 06/24/2014   Nodule of left lung 01/21/2014   Class 1 obesity due to excess calories with serious comorbidity and body mass index (BMI) of 30.0 to 30.9 in adult 06/12/2012   Palpitations 11/29/2011   Cutaneous horn 09/29/2011   Abnormal mammogram of right breast 08/30/2011   Arthritis of left knee 05/24/2011   CAD (coronary artery disease) 05/24/2011   History of acute myocardial infarction of inferior wall 05/24/2011   Gastro-esophageal reflux disease with esophagitis 05/24/2011   Hyperlipidemia 05/24/2011    Past Surgical History:  Procedure Laterality Date   TOTAL  THYROIDECTOMY  2016    OB History   No obstetric history on file.      Home Medications    Prior to Admission medications   Medication Sig Start Date End Date Taking? Authorizing Provider  Accu-Chek Softclix Lancets lancets Use as instructed. Accu-chek Guide  10/23/21   Christen Butter, NP  AMBULATORY NON FORMULARY MEDICATION Knee-high, medium compression, graduated compression stockings. Apply to lower extremities. [email or web address], Zippered Compression Stockings, medium circ, long length 12/15/16   Carlis Stable, PA-C  ammonium lactate (LAC-HYDRIN) 12 % lotion APPLY TOPICALLY TO THE AFFECTED AREA AS NEEDED FOR DRY SKIN 02/10/22   Christen Butter, NP  ASPIRIN PO Take 81 mg by mouth daily.    [provider]  blood glucose meter kit and supplies Dispense based on patient and insurance preference. Check daily. (FOR ICD-10 E10.9, E11.9). 10/23/21   Christen Butter, NP  dapagliflozin propanediol (FARXIGA) 5 MG TABS tablet Take 1 tablet (5 mg total) by mouth daily. NEEDS APPOINTMENT FOR FURTHER REFILLS. 03/21/23   Christen Butter, NP  DICLOFENAC SODIUM EX Apply topically as needed.    [provider]  Dulaglutide (TRULICITY) 0.75 MG/0.5ML SOPN Inject 1.5 mg into the skin once a week. 10/08/22   Christen Butter, NP  esomeprazole (NEXIUM) 20 MG capsule TAKE 1 CAPSULE BY MOUTH DAILY AT 12:00 NOON 11/18/22   Christen Butter, NP  fluticasone (FLONASE) 50 MCG/ACT nasal spray Place into both nostrils as needed for allergies or rhinitis.    [provider]  glucose blood (ACCU-CHEK GUIDE) test strip USE AS INSTRUCTED TO TEST ONCE DAILY 11/25/22   Christen Butter, NP  isosorbide mononitrate (IMDUR) 60 MG 24 hr tablet Take 1 tablet (60 mg total) by mouth daily. 05/21/22   Lewayne Bunting, MD  levothyroxine (SYNTHROID) 75 MCG tablet TAKE 1 TABLET(75 MCG) BY MOUTH DAILY BEFORE BREAKFAST 10/25/22   Christen Butter, NP  lisinopril-hydrochlorothiazide (ZESTORETIC) 20-12.5 MG tablet Take 1 tablet by mouth daily. 05/21/22   Lewayne Bunting, MD  metFORMIN (GLUCOPHAGE) 1000 MG tablet TAKE 1 TABLET(1000 MG) BY MOUTH EVERY MORNING 12/23/22   Christen Butter, NP  metoprolol succinate (TOPROL-XL) 50 MG 24 hr tablet TAKE 1 TABLET(50 MG) BY MOUTH DAILY WITH OR IMMEDIATELY FOLLOWING  A MEAL 05/21/22   Crenshaw, Madolyn Frieze, MD  nitroGLYCERIN (NITROSTAT) 0.4 MG SL tablet DISSOLVE 1 TABLET UNDER THE TONGUE EVERY 5 MINUTES, AS NEEDED FOR CHEST PAIN. MAX 3 TABLETS IN 15 MINUTES 09/07/21   Lewayne Bunting, MD  rosuvastatin (CRESTOR) 40 MG tablet TAKE 1 TABLET(40 MG) BY MOUTH AT BEDTIME 10/26/22   Christen Butter, NP  triamcinolone cream (KENALOG) 0.1 %  10/21/15   [provider]  VITAMIN D PO Take by mouth.    [provider]    Family History Family History  Problem Relation Age of Onset   Hypertension Mother    Hypertension Sister    Hypertension Brother    Breast cancer Daughter    Hypertension Sister     Social History Social History   Tobacco Use   Smoking status: Former   Smokeless tobacco: Never  Advertising account planner   Vaping status: Never Used  Substance Use Topics   Alcohol use: No   Drug use: No     Allergies   Bee venom, Tuberculin tests, and Tape   Review of Systems Review of Systems  Musculoskeletal:        Right leg pain and redness     Physical Exam Triage Vital  Signs ED Triage Vitals  Encounter Vitals Group     BP      Systolic BP Percentile      Diastolic BP Percentile      Pulse      Resp      Temp      Temp src      SpO2      Weight      Height      Head Circumference      Peak Flow      Pain Score      Pain Loc      Pain Education      Exclude from Growth Chart    No data found.  Updated Vital Signs BP 114/72 (BP Location: Left Arm)   Pulse 69   Temp 97.9 F (36.6 C) (Oral)   Resp 17   SpO2 94%     Physical Exam Vitals and nursing note reviewed.  Constitutional:      General: She is not in acute distress.    Appearance: Normal appearance. She is normal weight. She is not ill-appearing, toxic-appearing or diaphoretic.  HENT:     Head: Normocephalic and atraumatic.     Mouth/Throat:     Mouth: Mucous membranes are moist.     Pharynx: Oropharynx is clear.  Eyes:     Extraocular Movements: Extraocular  movements intact.     Conjunctiva/sclera: Conjunctivae normal.     Pupils: Pupils are equal, round, and reactive to light.  Cardiovascular:     Rate and Rhythm: Normal rate and regular rhythm.     Pulses: Normal pulses.     Heart sounds: Normal heart sounds.  Pulmonary:     Effort: Pulmonary effort is normal.     Breath sounds: Normal breath sounds. No wheezing, rhonchi or rales.  Musculoskeletal:        General: Normal range of motion.     Cervical back: Normal range of motion and neck supple.     Comments: Right lower leg (medial aspect along saphenous): Extremely TTP swollen warm/hot to touch, patient walking with antalgic gait, concern for DVT  Skin:    General: Skin is warm and dry.  Neurological:     General: No focal deficit present.     Mental Status: She is alert and oriented to person, place, and time.  Psychiatric:        Mood and Affect: Mood normal.        Behavior: Behavior normal.        Thought Content: Thought content normal.      UC Treatments / Results  Labs (all labs ordered are listed, but only abnormal results are displayed) Labs Reviewed - No data to display  EKG   Radiology No results found.  Procedures Procedures (including critical care time)  Medications Ordered in UC Medications - No data to display  Initial Impression / Assessment and Plan / UC Course  I have reviewed the triage vital signs and the nursing notes.  Pertinent labs & imaging results that were available during my care of the patient were reviewed by me and considered in my medical decision making (see chart for details).     MDM: 1.  Pain and swelling of right lower leg-physical exam concerning for DVT.  Instructed patient to go to [facility] ED now for further evaluation of pain and swelling of right lower leg and to rule out deep vein thrombosis by ultrasound.  Patient agreed and verbalized understanding of these instructions and this plan of care  this evening.  Patient discharged to ED hemodynamically stable.  Final Clinical Impressions(s) / UC Diagnoses   Final diagnoses:  Pain and swelling of right lower leg     Discharge Instructions      Advised patient to go to [facility] ED now for further evaluation of pain and swelling of right lower leg and to rule out deep vein thrombosis by ultrasound.     ED Prescriptions   None    PDMP not reviewed this encounter.   Trevor Iha, FNP 04/07/23 1819

## 2023-04-18 ENCOUNTER — Other Ambulatory Visit: Payer: Self-pay | Admitting: Medical-Surgical

## 2023-05-06 ENCOUNTER — Ambulatory Visit (INDEPENDENT_AMBULATORY_CARE_PROVIDER_SITE_OTHER): Payer: 59 | Admitting: Medical-Surgical

## 2023-05-06 ENCOUNTER — Encounter: Payer: Self-pay | Admitting: Medical-Surgical

## 2023-05-06 VITALS — BP 90/54 | HR 76 | Resp 20 | Ht 63.0 in | Wt 173.1 lb

## 2023-05-06 DIAGNOSIS — F22 Delusional disorders: Secondary | ICD-10-CM

## 2023-05-06 DIAGNOSIS — R202 Paresthesia of skin: Secondary | ICD-10-CM

## 2023-05-06 DIAGNOSIS — Z23 Encounter for immunization: Secondary | ICD-10-CM

## 2023-05-06 DIAGNOSIS — M7989 Other specified soft tissue disorders: Secondary | ICD-10-CM | POA: Diagnosis not present

## 2023-05-06 DIAGNOSIS — I952 Hypotension due to drugs: Secondary | ICD-10-CM

## 2023-05-06 NOTE — Addendum Note (Signed)
Addended by: Latanya Presser on: 05/06/2023 02:45 PM   Modules accepted: Orders
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
## 2023-05-07 LAB — CMP14+EGFR
ALT: 25 [IU]/L (ref 0–32)
AST: 21 [IU]/L (ref 0–40)
Albumin: 4 g/dL (ref 3.8–4.8)
Alkaline Phosphatase: 71 [IU]/L (ref 44–121)
BUN/Creatinine Ratio: 19 (ref 12–28)
BUN: 26 mg/dL (ref 8–27)
Bilirubin Total: <0.2 mg/dL (ref 0.0–1.2)
CO2: 28 mmol/L (ref 20–29)
Calcium: 9.3 mg/dL (ref 8.7–10.3)
Chloride: 99 mmol/L (ref 96–106)
Creatinine, Ser: 1.35 mg/dL — ABNORMAL HIGH (ref 0.57–1.00)
Globulin, Total: 2.4 g/dL (ref 1.5–4.5)
Glucose: 111 mg/dL — ABNORMAL HIGH (ref 70–99)
Potassium: 3.6 mmol/L (ref 3.5–5.2)
Sodium: 140 mmol/L (ref 134–144)
Total Protein: 6.4 g/dL (ref 6.0–8.5)
eGFR: 42 mL/min/{1.73_m2} — ABNORMAL LOW (ref >59)

## 2023-05-07 LAB — CBC WITH DIFFERENTIAL/PLATELET
Basophils Absolute: 0 10*3/uL (ref 0.0–0.2)
Basos: 1 %
EOS (ABSOLUTE): 0.2 10*3/uL (ref 0.0–0.4)
Eos: 3 %
Hematocrit: 34.5 % (ref 34.0–46.6)
Hemoglobin: 10.9 g/dL — ABNORMAL LOW (ref 11.1–15.9)
Immature Grans (Abs): 0 10*3/uL (ref 0.0–0.1)
Immature Granulocytes: 0 %
Lymphocytes Absolute: 2.2 10*3/uL (ref 0.7–3.1)
Lymphs: 42 %
MCH: 26.8 pg (ref 26.6–33.0)
MCHC: 31.6 g/dL (ref 31.5–35.7)
MCV: 85 fL (ref 79–97)
Monocytes Absolute: 0.5 10*3/uL (ref 0.1–0.9)
Monocytes: 10 %
Neutrophils Absolute: 2.3 10*3/uL (ref 1.4–7.0)
Neutrophils: 44 %
Platelets: 243 10*3/uL (ref 150–450)
RBC: 4.07 x10E6/uL (ref 3.77–5.28)
RDW: 15.2 % (ref 11.7–15.4)
WBC: 5.2 10*3/uL (ref 3.4–10.8)

## 2023-05-07 LAB — BRAIN NATRIURETIC PEPTIDE: BNP: 7.3 pg/mL (ref 0.0–100.0)

## 2023-05-07 LAB — IRON,TIBC AND FERRITIN PANEL
Ferritin: 78 ng/mL (ref 15–150)
Iron Saturation: 19 % (ref 15–55)
Iron: 57 ug/dL (ref 27–139)
Total Iron Binding Capacity: 304 ug/dL (ref 250–450)
UIBC: 247 ug/dL (ref 118–369)

## 2023-05-07 LAB — MAGNESIUM: Magnesium: 2.2 mg/dL (ref 1.6–2.3)

## 2023-05-07 LAB — VITAMIN D 25 HYDROXY (VIT D DEFICIENCY, FRACTURES): Vit D, 25-Hydroxy: 85.8 ng/mL (ref 30.0–100.0)

## 2023-05-07 LAB — VITAMIN B12: Vitamin B-12: 882 pg/mL (ref 232–1245)

## 2023-05-12 ENCOUNTER — Other Ambulatory Visit: Payer: Self-pay

## 2023-05-12 ENCOUNTER — Ambulatory Visit: Payer: 59 | Attending: Cardiology | Admitting: Cardiology

## 2023-05-12 ENCOUNTER — Encounter: Payer: Self-pay | Admitting: Cardiology

## 2023-05-12 VITALS — BP 104/70 | HR 78 | Ht 63.0 in | Wt 171.0 lb

## 2023-05-12 DIAGNOSIS — I10 Essential (primary) hypertension: Secondary | ICD-10-CM

## 2023-05-12 DIAGNOSIS — E785 Hyperlipidemia, unspecified: Secondary | ICD-10-CM

## 2023-05-12 DIAGNOSIS — I2510 Atherosclerotic heart disease of native coronary artery without angina pectoris: Secondary | ICD-10-CM

## 2023-05-12 DIAGNOSIS — M79604 Pain in right leg: Secondary | ICD-10-CM

## 2023-05-12 NOTE — Patient Instructions (Signed)
Medication Instructions:  NO change  *If you need a refill on your cardiac medications before your next appointment, please call your pharmacy*   Lab Work: NO  If you have labs (blood work) drawn today and your tests are completely normal, you will receive your results only by: MyChart Message (if you have MyChart) OR A paper copy in the mail If you have any lab test that is abnormal or we need to change your treatment, we will call you to review the results.   Testing/Procedures: NONE    Follow-Up: At [facility], you and your health needs are our priority.  As part of our continuing mission to provide you with exceptional heart care, we have created designated Provider Care Teams.  These Care Teams include your primary Cardiologist (physician) and Advanced Practice Providers (APPs -  Physician Assistants and Nurse Practitioners) who all work together to provide you with the care you need, when you need it.   Your next appointment:   1 year(s)  Provider:   Olga Millers, MD

## 2023-05-12 NOTE — Progress Notes (Signed)
HPI: FU CAD. Previously followed by Dr. Lanell Matar. Apparently had myocardial infarction in 2005; cardiac catheterization revealed occluded infarct vessel which was treated medically. Not all records available. Echocardiogram July 2017 showed normal LV function, grade 1 diastolic dysfunction left atrial enlargement. Carotid Dopplers January 2020 showed no significant stenosis. Nuclear study February 2023 showed ejection fraction 61% and no ischemia.  Monitor March 2023 showed sinus rhythm with occasional PAC, brief episodes of PAT and rare PVC.  Dobutamine echocardiogram June 2023 at [facility] showed normal LV function and no ischemia.  Lower extremity venous Doppler September 2024 showed no DVT.  Since last seen, the patient denies any dyspnea on exertion, orthopnea, PND, pedal edema, palpitations, syncope or chest pain.   Current Outpatient Medications  Medication Sig Dispense Refill   Accu-Chek Softclix Lancets lancets Use as instructed. Accu-chek Guide 100 each 12   AMBULATORY NON FORMULARY MEDICATION Knee-high, medium compression, graduated compression stockings. Apply to lower extremities. [email or web address], Zippered Compression Stockings, medium circ, long length 1 each 0   ammonium lactate (LAC-HYDRIN) 12 % lotion APPLY TOPICALLY TO THE AFFECTED AREA AS NEEDED FOR DRY SKIN 400 g 0   ASPIRIN PO Take 81 mg by mouth daily.     blood glucose meter kit and supplies Dispense based on patient and insurance preference. Check daily. (FOR ICD-10 E10.9, E11.9). 1 each 0   dapagliflozin propanediol (FARXIGA) 5 MG TABS tablet Take 1 tablet (5 mg total) by mouth daily. NEEDS APPOINTMENT FOR FURTHER REFILLS. 15 tablet 0   DICLOFENAC SODIUM EX Apply topically as needed.     Dulaglutide (TRULICITY) 0.75 MG/0.5ML SOPN Inject 1.5 mg into the skin once a week. 12 mL 1   esomeprazole (NEXIUM) 20 MG capsule TAKE 1 CAPSULE BY MOUTH DAILY AT 12:00 NOON 90 capsule 1   fluticasone (FLONASE) 50 MCG/ACT  nasal spray Place into both nostrils as needed for allergies or rhinitis.     glucose blood (ACCU-CHEK GUIDE) test strip USE AS INSTRUCTED TO TEST ONCE DAILY 100 strip 1   isosorbide mononitrate (IMDUR) 60 MG 24 hr tablet Take 1 tablet (60 mg total) by mouth daily. 90 tablet 3   levothyroxine (SYNTHROID) 75 MCG tablet TAKE 1 TABLET(75 MCG) BY MOUTH DAILY BEFORE BREAKFAST 30 tablet 0   lisinopril-hydrochlorothiazide (ZESTORETIC) 20-12.5 MG tablet Take 1 tablet by mouth daily. 90 tablet 3   metFORMIN (GLUCOPHAGE) 1000 MG tablet TAKE 1 TABLET(1000 MG) BY MOUTH EVERY MORNING 90 tablet 1   metoprolol succinate (TOPROL-XL) 50 MG 24 hr tablet TAKE 1 TABLET(50 MG) BY MOUTH DAILY WITH OR IMMEDIATELY FOLLOWING A MEAL 90 tablet 3   nitroGLYCERIN (NITROSTAT) 0.4 MG SL tablet DISSOLVE 1 TABLET UNDER THE TONGUE EVERY 5 MINUTES, AS NEEDED FOR CHEST PAIN. MAX 3 TABLETS IN 15 MINUTES 25 tablet 6   rosuvastatin (CRESTOR) 40 MG tablet TAKE 1 TABLET(40 MG) BY MOUTH AT BEDTIME 90 tablet 1   triamcinolone cream (KENALOG) 0.1 %      VITAMIN D PO Take by mouth.     No current facility-administered medications for this visit.     Past Medical History:  Diagnosis Date   Aortic atherosclerosis (HCC) 06/10/2017   CAD (coronary artery disease)    Cancer (HCC)    Combined form of age-related cataract, both eyes 04/07/2017   Dermatochalasis of both eyelids 04/07/2017   Diabetes mellitus without complication (HCC)    GERD (gastroesophageal reflux disease)    Hypertension    Hypothyroidism    Lacunar infarction (  HCC) 07/20/2018   Left renal mass 10/04/2018   1.7 cm hypoechoic mass 10/04/2018   Lumbar degenerative disc disease 03/09/2017   Myocardial infarction [facility]) 2005   Normocytic anemia 12/14/2018   Palpitations    Papillary thyroid carcinoma (HCC)    Posterior vitreous detachment, right eye 04/07/2017    Past Surgical History:  Procedure Laterality Date   TOTAL THYROIDECTOMY  2016    Social History    Socioeconomic History   Marital status: Divorced    Spouse name: Not on file   Number of children: 1   Years of education: 16   Highest education level: Bachelor's degree (e.g., BA, AB, BS)  Occupational History   Occupation: Retired.  Tobacco Use   Smoking status: Former   Smokeless tobacco: Never  Vaping Use   Vaping status: Never Used  Substance and Sexual Activity   Alcohol use: No   Drug use: No   Sexual activity: Not Currently  Other Topics Concern   Not on file  Social History Narrative   Lives alone. She has one daughter who lives close by. She enjoys dancing, playing cards and travel.    Social Determinants of Health   Financial Resource Strain: Low Risk  (09/24/2022)   Overall Financial Resource Strain (CARDIA)    Difficulty of Paying Living Expenses: Not hard at all  Food Insecurity: No Food Insecurity (09/24/2022)   Hunger Vital Sign    Worried About Running Out of Food in the Last Year: Never true    Ran Out of Food in the Last Year: Never true  Transportation Needs: No Transportation Needs (09/24/2022)   PRAPARE - Administrator, Civil Service (Medical): No    Lack of Transportation (Non-Medical): No  Physical Activity: Not on File (08/03/2019)   Received from [place], [place]   Physical Activity    Physical Activity: 0  Stress: No Stress Concern Present (09/24/2022)   [employer] of Occupational Health - Occupational Stress Questionnaire    Feeling of Stress : Only a little  Social Connections: Moderately Integrated (09/24/2022)   Social Connection and Isolation Panel [NHANES]    Frequency of Communication with Friends and Family: More than three times a week    Frequency of Social Gatherings with Friends and Family: Three times a week    Attends Religious Services: More than 4 times per year    Active Member of Clubs or Organizations: Yes    Attends Banker Meetings: More than 4 times per year    Marital Status: Divorced  Intimate  Partner Violence: Not At Risk (09/24/2022)   Humiliation, Afraid, Rape, and Kick questionnaire    Fear of Current or Ex-Partner: No    Emotionally Abused: No    Physically Abused: No    Sexually Abused: No    Family History  Problem Relation Age of Onset   Hypertension Mother    Hypertension Sister    Hypertension Brother    Breast cancer Daughter    Hypertension Sister     ROS: Knee pain but no fevers or chills, productive cough, hemoptysis, dysphasia, odynophagia, melena, hematochezia, dysuria, hematuria, rash, seizure activity, orthopnea, PND, pedal edema, claudication. Remaining systems are negative.  Physical Exam: Well-developed well-nourished in no acute distress.  Skin is warm and dry.  HEENT is normal.  Neck is supple.  Chest is clear to auscultation with normal expansion.  Cardiovascular exam is regular rate and rhythm.  Abdominal exam nontender or distended. No masses palpated. Extremities  show no edema. neuro grossly intact  EKG Interpretation Date/Time:  Thursday May 12 2023 15:09:46 EDT Ventricular Rate:  78 PR Interval:  162 QRS Duration:  76 QT Interval:  384 QTC Calculation: 437 R Axis:   -9  Text Interpretation: Normal sinus rhythm Normal ECG When compared with ECG of 06-Sep-2021 02:38, PREVIOUS ECG IS PRESENT Confirmed by Olga Millers ([id number]) on 05/12/2023 3:14:01 PM    A/P  1 coronary artery disease-no chest pain.  Plan to continue medical therapy with aspirin and statin.  2 palpitations-no recent symptoms.  3 hypertension-blood pressure controlled.  She has had some difficulties apparently with hypotension and is not taking her lisinopril HCT.  I have asked her to follow her blood pressure and we can resume at lower doses if necessary.  4 hyperlipidemia-continue statin.  Olga Millers, MD

## 2023-05-18 ENCOUNTER — Encounter: Payer: Self-pay | Admitting: Physical Therapy

## 2023-05-18 ENCOUNTER — Other Ambulatory Visit: Payer: Self-pay

## 2023-05-18 ENCOUNTER — Other Ambulatory Visit: Payer: Self-pay | Admitting: Cardiology

## 2023-05-18 ENCOUNTER — Ambulatory Visit: Payer: 59 | Attending: Medical-Surgical | Admitting: Physical Therapy

## 2023-05-18 DIAGNOSIS — M6281 Muscle weakness (generalized): Secondary | ICD-10-CM | POA: Diagnosis not present

## 2023-05-18 DIAGNOSIS — R262 Difficulty in walking, not elsewhere classified: Secondary | ICD-10-CM | POA: Insufficient documentation

## 2023-05-18 DIAGNOSIS — M25561 Pain in right knee: Secondary | ICD-10-CM | POA: Diagnosis not present

## 2023-05-18 DIAGNOSIS — M25562 Pain in left knee: Secondary | ICD-10-CM | POA: Diagnosis not present

## 2023-05-18 DIAGNOSIS — K219 Gastro-esophageal reflux disease without esophagitis: Secondary | ICD-10-CM

## 2023-05-18 DIAGNOSIS — G8929 Other chronic pain: Secondary | ICD-10-CM | POA: Insufficient documentation

## 2023-05-18 DIAGNOSIS — M79605 Pain in left leg: Secondary | ICD-10-CM | POA: Diagnosis not present

## 2023-05-18 DIAGNOSIS — M79604 Pain in right leg: Secondary | ICD-10-CM | POA: Insufficient documentation

## 2023-05-18 MED ORDER — ESOMEPRAZOLE MAGNESIUM 20 MG PO CPDR: 20.0000 mg | DELAYED_RELEASE_CAPSULE | Freq: Every day | ORAL | 1 refills | Status: DC

## 2023-05-18 NOTE — Therapy (Signed)
OUTPATIENT PHYSICAL THERAPY LOWER EXTREMITY EVALUATION   Patient Name: Sara Gardner MRN: [id number] DOB:07/21/1951, 72 y.o., female Today's Date: 05/18/2023  END OF SESSION:  PT End of Session - 05/18/23 1444     Visit Number 1    Number of Visits 16    Date for PT Re-Evaluation 06/28/23    Authorization Type [employer] Medicare    Authorization - Visit Number 1    Progress Note Due on Visit 10    PT Start Time 1400    PT Stop Time 1440    PT Time Calculation (min) 40 min    Activity Tolerance Patient tolerated treatment well    Behavior During Therapy WFL for tasks assessed/performed             Past Medical History:  Diagnosis Date   Aortic atherosclerosis (HCC) 06/10/2017   CAD (coronary artery disease)    Cancer (HCC)    Combined form of age-related cataract, both eyes 04/07/2017   Dermatochalasis of both eyelids 04/07/2017   Diabetes mellitus without complication (HCC)    GERD (gastroesophageal reflux disease)    Hypertension    Hypothyroidism    Lacunar infarction (HCC) 07/20/2018   Left renal mass 10/04/2018   1.7 cm hypoechoic mass 10/04/2018   Lumbar degenerative disc disease 03/09/2017   Myocardial infarction (HCC) 2005   Normocytic anemia 12/14/2018   Palpitations    Papillary thyroid carcinoma (HCC)    Posterior vitreous detachment, right eye 04/07/2017   Past Surgical History:  Procedure Laterality Date   TOTAL THYROIDECTOMY  2016   Patient Active Problem List   Diagnosis Date Noted   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/05/2019   Cervicalgia 04/05/2019   Eustachian tube dysfunction, bilateral 04/05/2019   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   White matter abnormality on MRI of brain 04/05/2019   Myalgia due to statin 12/14/2018   Normocytic anemia 12/14/2018   At moderate risk for fall 10/03/2018   Bilateral renal cysts 10/03/2018   Facet arthropathy, lumbosacral 10/03/2018   Lacunar infarction (HCC) 07/20/2018   Controlled type 2  diabetes mellitus without complication, without long-term current use of insulin (HCC) 03/08/2018   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/23/2017   Overactive bladder 08/17/2017   Equinus contracture of ankle 07/27/2017   Hammer toes of both feet 07/27/2017   Dermatophytosis, nail 07/27/2017   Calcaneal spur of both feet 07/27/2017   Aortic atherosclerosis (HCC) 06/10/2017   Combined form of age-related cataract, both eyes 04/07/2017   Dermatochalasis of both eyelids 04/07/2017   Posterior vitreous detachment, right eye 04/07/2017   Postural kyphosis of cervicothoracic region 03/09/2017   Lumbar degenerative disc disease 03/09/2017   Bilateral primary osteoarthritis of knee 12/15/2016   Allergy to honey bee venom 12/15/2016   Hypertension 02/16/2016   DM type 2 with diabetic dyslipidemia (HCC) 02/10/2016   Gastroesophageal reflux disease without esophagitis 02/10/2016   Left ventricular diastolic dysfunction, NYHA class 1 01/28/2016   Hypothyroidism, 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   Urticaria 12/30/2015   Diabetic sensorimotor polyneuropathy (HCC) 10/27/2015   Low back strain 10/27/2015   Right inguinal pain 04/21/2015   Healthcare maintenance 12/30/2014   Skin rash 09/16/2014   Spider veins of both lower extremities 09/16/2014   Thyroid cancer (HCC) 08/17/2014   Papillary thyroid carcinoma (HCC) 08/09/2014   Post-menopausal atrophic vaginitis 06/24/2014   Nodule of left lung 01/21/2014   Class 1 obesity due to excess calories with serious comorbidity and body mass index (BMI) of 30.0 to 30.9 in  adult 06/12/2012   Palpitations 11/29/2011   Cutaneous horn 09/29/2011   Abnormal mammogram of right breast 08/30/2011   Arthritis of left knee 05/24/2011   CAD (coronary artery disease) 05/24/2011   History of acute myocardial infarction of inferior wall 05/24/2011   Gastro-esophageal reflux disease with esophagitis 05/24/2011   Hyperlipidemia 05/24/2011    PCP: Christen Butter  REFERRING PROVIDER: Christen Butter  REFERRING DIAG: pain in bilateral lower extremities  THERAPY DIAG:  Chronic pain of both knees  Muscle weakness (generalized)  Difficulty in walking, not elsewhere classified  Rationale for Evaluation and Treatment: Rehabilitation  ONSET DATE: 03/2023  SUBJECTIVE:   SUBJECTIVE STATEMENT: Pt states that a month ago her legs swelled up and began burning. She states she has had difficulty with stairs and has even had to walk with a cane due to pain. She states she has pain in both knees, as well as burning feeling but only when she is at home. She states that she has had to be more cautious with walking due to increased fear of falling due to pain.  PERTINENT HISTORY: CAD, DM PAIN:  Are you having pain? Yes: NPRS scale: 12/02/08 Pain location: bilateral knees Pain description: sore/achey Aggravating factors: stairs, walking Relieving factors: rest, voltaren  PRECAUTIONS: None  RED FLAGS: None   WEIGHT BEARING RESTRICTIONS: No  FALLS:  Has patient fallen in last 6 months? No  LIVING ENVIRONMENT: Lives with: lives alone Lives in: House/apartment Stairs: Yes: External: 5 steps; bilateral but cannot reach both Has following equipment at home: Single point cane  OCCUPATION: just retired from [employer], active with her church  PLOF: Independent  PATIENT GOALS: decrease pain  NEXT MD VISIT: PRN  OBJECTIVE:  Note: Objective measures were completed at Evaluation unless otherwise noted.  DIAGNOSTIC FINDINGS: Ultrasound: negative for DVT    COGNITION: Overall cognitive status: Within functional limits for tasks assessed     SENSATION: Pt c/o burning in bilateral LEs   MUSCLE LENGTH: Hamstrings: Right 95 deg; Left 95 deg   PALPATION: TTP bilat lateral, medial and posterior knees Patella mobility WFL  LOWER EXTREMITY ROM:  AROM ROM Right eval Left eval  Hip flexion    Hip extension    Hip abduction    Hip  adduction    Hip internal rotation    Hip external rotation    Knee flexion 109 110  Knee extension 0 0  Ankle dorsiflexion    Ankle plantarflexion    Ankle inversion    Ankle eversion     (Blank rows = not tested)  LOWER EXTREMITY MMT:  MMT Right eval Left eval  Hip flexion 4- 4  Hip extension 3+ 3+  Hip abduction 3+ 3+  Hip adduction    Hip internal rotation    Hip external rotation    Knee flexion 4- 4-  Knee extension 4 4  Ankle dorsiflexion    Ankle plantarflexion    Ankle inversion    Ankle eversion     (Blank rows = not tested)  LOWER EXTREMITY SPECIAL TESTS:  Ober's negative bilat  FUNCTIONAL TESTS:  5 times sit to stand: 30.10 seconds SLS: unable due to pain  GAIT: Stairs: pt leads with Rt LE due to Lt knee pain. Needs bilat handrails. Step to pattern   TODAY'S TREATMENT:  DATE: 10/23 See HEP    PATIENT EDUCATION:  Education details: PT POC and goals, HEP Person educated: Patient Education method: Explanation, Demonstration, and Handouts Education comprehension: verbalized understanding and returned demonstration  HOME EXERCISE PROGRAM: Access Code: [email or web address] URL: [email or web address] Date: 05/18/2023 Prepared by: Reggy Eye  Exercises - Seated Calf Stretch with Strap  - 1 x daily - 7 x weekly - 1 sets - 3 reps - 20-30 seconds hold - Seated Hamstring Stretch  - 1 x daily - 7 x weekly - 1 sets - 3 reps - 20-30 seconds hold - Sit to Stand with Armchair  - 1 x daily - 7 x weekly - 2 sets - 10 reps  ASSESSMENT:  CLINICAL IMPRESSION: Patient is a 72 y.o. female who was seen today for physical therapy evaluation and treatment for pain in bilateral lower extremities. Pt presents with increased pain in bilateral knees, decreased strength and balance, decreased activity tolerance and impaired gait. She will  benefit from skilled PT to address deficits and improve functional mobility and reduce risk of falls..   OBJECTIVE IMPAIRMENTS: decreased activity tolerance, decreased balance, difficulty walking, decreased ROM, decreased strength, and pain.   ACTIVITY LIMITATIONS: standing, squatting, stairs, transfers, and locomotion level  PARTICIPATION LIMITATIONS: cleaning, shopping, and community activity  PERSONAL FACTORS: Past/current experiences and Time since onset of injury/illness/exacerbation are also affecting patient's functional outcome.   REHAB POTENTIAL: Good  CLINICAL DECISION MAKING: Stable/uncomplicated  EVALUATION COMPLEXITY: Low   GOALS: Goals reviewed with patient? Yes  SHORT TERM GOALS: Target date: 06/15/2023   Pt will be independent with initial HEP Baseline: Goal status: INITIAL  2.  Pt will demo improved strength with 5 x STS <= 25 seconds Baseline:  Goal status: INITIAL   LONG TERM GOALS: Target date: 07/13/2023    Pt will be independent in advanced HEP Baseline:  Goal status: INITIAL  2.  Pt will demo improved strength with 5 x STS <= 20 seconds Baseline:  Goal status: INITIAL  3.  Pt will negotiate 1 flight of stairs without handrail with reciprocal pattern Baseline:  Goal status: INITIAL  4.  Pt will tolerate SLS x 10 seconds bilat LE to improve balance and reduce risk of falls Baseline:  Goal status: INITIAL    PLAN:  PT FREQUENCY: 1-2x/week  PT DURATION: 8 weeks  PLANNED INTERVENTIONS: 97164- PT Re-evaluation, 97110-Therapeutic exercises, 97530- Therapeutic activity, 97112- Neuromuscular re-education, 97535- Self Care, [id number]- Manual therapy, [id number]- Aquatic Therapy, 97014- Electrical stimulation (unattended), 97016- Vasopneumatic device, [id number]- Ionotophoresis 4mg /ml Dexamethasone, Patient/Family education, Balance training, Taping, Dry Needling, Cryotherapy, and Moist heat  PLAN FOR NEXT SESSION: assess response to HEP, knee and hip  strength   Ewelina Naves, PT 05/18/2023, 2:46 PM

## 2023-05-25 ENCOUNTER — Ambulatory Visit: Payer: 59 | Admitting: Physical Therapy

## 2023-05-27 ENCOUNTER — Ambulatory Visit: Payer: 59 | Attending: Medical-Surgical | Admitting: Physical Therapy

## 2023-05-27 ENCOUNTER — Encounter: Payer: Self-pay | Admitting: Physical Therapy

## 2023-05-27 DIAGNOSIS — M25561 Pain in right knee: Secondary | ICD-10-CM | POA: Insufficient documentation

## 2023-05-27 DIAGNOSIS — M25562 Pain in left knee: Secondary | ICD-10-CM | POA: Diagnosis not present

## 2023-05-27 DIAGNOSIS — R262 Difficulty in walking, not elsewhere classified: Secondary | ICD-10-CM | POA: Insufficient documentation

## 2023-05-27 DIAGNOSIS — G8929 Other chronic pain: Secondary | ICD-10-CM | POA: Insufficient documentation

## 2023-05-27 DIAGNOSIS — M6281 Muscle weakness (generalized): Secondary | ICD-10-CM | POA: Insufficient documentation

## 2023-05-27 NOTE — Therapy (Signed)
OUTPATIENT PHYSICAL THERAPY   Patient Name: Sara Gardner MRN: [id number] DOB:February 04, 1951, 72 y.o., female Today's Date: 05/27/2023  END OF SESSION:  PT End of Session - 05/27/23 0844     Visit Number 2    Number of Visits 16    Date for PT Re-Evaluation 06/28/23    Authorization Type [employer] Medicare    Authorization - Visit Number 2    Progress Note Due on Visit 10    PT Start Time 0807    PT Stop Time 0845    PT Time Calculation (min) 38 min    Activity Tolerance Patient tolerated treatment well    Behavior During Therapy [facility] for tasks assessed/performed              Past Medical History:  Diagnosis Date   Aortic atherosclerosis (HCC) 06/10/2017   CAD (coronary artery disease)    Cancer (HCC)    Combined form of age-related cataract, both eyes 04/07/2017   Dermatochalasis of both eyelids 04/07/2017   Diabetes mellitus without complication (HCC)    GERD (gastroesophageal reflux disease)    Hypertension    Hypothyroidism    Lacunar infarction (HCC) 07/20/2018   Left renal mass 10/04/2018   1.7 cm hypoechoic mass 10/04/2018   Lumbar degenerative disc disease 03/09/2017   Myocardial infarction (HCC) 2005   Normocytic anemia 12/14/2018   Palpitations    Papillary thyroid carcinoma (HCC)    Posterior vitreous detachment, right eye 04/07/2017   Past Surgical History:  Procedure Laterality Date   TOTAL THYROIDECTOMY  2016   Patient Active Problem List   Diagnosis Date Noted   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/05/2019   Cervicalgia 04/05/2019   Eustachian tube dysfunction, bilateral 04/05/2019   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   White matter abnormality on MRI of brain 04/05/2019   Myalgia due to statin 12/14/2018   Normocytic anemia 12/14/2018   At moderate risk for fall 10/03/2018   Bilateral renal cysts 10/03/2018   Facet arthropathy, lumbosacral 10/03/2018   Lacunar infarction (HCC) 07/20/2018   Controlled type 2 diabetes mellitus without  complication, without long-term current use of insulin (HCC) 03/08/2018   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/23/2017   Overactive bladder 08/17/2017   Equinus contracture of ankle 07/27/2017   Hammer toes of both feet 07/27/2017   Dermatophytosis, nail 07/27/2017   Calcaneal spur of both feet 07/27/2017   Aortic atherosclerosis (HCC) 06/10/2017   Combined form of age-related cataract, both eyes 04/07/2017   Dermatochalasis of both eyelids 04/07/2017   Posterior vitreous detachment, right eye 04/07/2017   Postural kyphosis of cervicothoracic region 03/09/2017   Lumbar degenerative disc disease 03/09/2017   Bilateral primary osteoarthritis of knee 12/15/2016   Allergy to honey bee venom 12/15/2016   Hypertension 02/16/2016   DM type 2 with diabetic dyslipidemia (HCC) 02/10/2016   Gastroesophageal reflux disease without esophagitis 02/10/2016   Left ventricular diastolic dysfunction, NYHA class 1 01/28/2016   Hypothyroidism, 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   Urticaria 12/30/2015   Diabetic sensorimotor polyneuropathy (HCC) 10/27/2015   Low back strain 10/27/2015   Right inguinal pain 04/21/2015   Healthcare maintenance 12/30/2014   Skin rash 09/16/2014   Spider veins of both lower extremities 09/16/2014   Thyroid cancer (HCC) 08/17/2014   Papillary thyroid carcinoma (HCC) 08/09/2014   Post-menopausal atrophic vaginitis 06/24/2014   Nodule of left lung 01/21/2014   Class 1 obesity due to excess calories with serious comorbidity and body mass index (BMI) of 30.0 to 30.9 in adult 06/12/2012  Palpitations 11/29/2011   Cutaneous horn 09/29/2011   Abnormal mammogram of right breast 08/30/2011   Arthritis of left knee 05/24/2011   CAD (coronary artery disease) 05/24/2011   History of acute myocardial infarction of inferior wall 05/24/2011   Gastro-esophageal reflux disease with esophagitis 05/24/2011   Hyperlipidemia 05/24/2011    PCP: Christen Butter  REFERRING PROVIDER:  Christen Butter  REFERRING DIAG: pain in bilateral lower extremities  THERAPY DIAG:  Chronic pain of both knees  Muscle weakness (generalized)  Rationale for Evaluation and Treatment: Rehabilitation  ONSET DATE: 03/2023  SUBJECTIVE:   SUBJECTIVE STATEMENT: Pt states she feels "a little" better. She still has the burning in her legs  PERTINENT HISTORY: CAD, DM  Pt states that a month ago her legs swelled up and began burning. She states she has had difficulty with stairs and has even had to walk with a cane due to pain. She states she has pain in both knees, as well as burning feeling but only when she is at home. She states that she has had to be more cautious with walking due to increased fear of falling due to pain. PAIN:  Are you having pain? Yes: NPRS scale: 12/02/08 Pain location: bilateral knees Pain description: sore/achey Aggravating factors: stairs, walking Relieving factors: rest, voltaren  PRECAUTIONS: None  RED FLAGS: None   WEIGHT BEARING RESTRICTIONS: No  FALLS:  Has patient fallen in last 6 months? No  LIVING ENVIRONMENT: Lives with: lives alone Lives in: House/apartment Stairs: Yes: External: 5 steps; bilateral but cannot reach both Has following equipment at home: Single point cane  OCCUPATION: just retired from [employer], active with her church  PLOF: Independent  PATIENT GOALS: decrease pain  NEXT MD VISIT: PRN  OBJECTIVE:  Note: Objective measures were completed at Evaluation unless otherwise noted.  DIAGNOSTIC FINDINGS: Ultrasound: negative for DVT    COGNITION: Overall cognitive status: Within functional limits for tasks assessed     SENSATION: Pt c/o burning in bilateral LEs   MUSCLE LENGTH: Hamstrings: Right 95 deg; Left 95 deg   PALPATION: TTP bilat lateral, medial and posterior knees Patella mobility WFL  LOWER EXTREMITY ROM:  AROM ROM Right eval Left eval  Hip flexion    Hip extension    Hip abduction    Hip  adduction    Hip internal rotation    Hip external rotation    Knee flexion 109 110  Knee extension 0 0  Ankle dorsiflexion    Ankle plantarflexion    Ankle inversion    Ankle eversion     (Blank rows = not tested)  LOWER EXTREMITY MMT:  MMT Right eval Left eval  Hip flexion 4- 4  Hip extension 3+ 3+  Hip abduction 3+ 3+  Hip adduction    Hip internal rotation    Hip external rotation    Knee flexion 4- 4-  Knee extension 4 4  Ankle dorsiflexion    Ankle plantarflexion    Ankle inversion    Ankle eversion     (Blank rows = not tested)  LOWER EXTREMITY SPECIAL TESTS:  Ober's negative bilat  FUNCTIONAL TESTS:  5 times sit to stand: 30.10 seconds SLS: unable due to pain  GAIT: Stairs: pt leads with Rt LE due to Lt knee pain. Needs bilat handrails. Step to pattern   TODAY'S TREATMENT:  [facility] Adult PT Treatment:                                                DATE: 05/27/23 Therapeutic Exercise: Nustep L5 x 5 min Seated calf stretch with strap 2 x 30 sec bilat Seated HS stretch 2 x 30 sec bilat SLR 2 x 10 bilat Knee flexion with LEs on physioball x 30 Supine clam green TB x 20 Sidelying hip abd 2 x 10 bilat Sit <> stand repetitions 2 x 5   DATE: 10/23 See HEP    PATIENT EDUCATION:  Education details: PT POC and goals, HEP Person educated: Patient Education method: Explanation, Demonstration, and Handouts Education comprehension: verbalized understanding and returned demonstration  HOME EXERCISE PROGRAM: Access Code: [email or web address] URL: [email or web address] Date: 05/18/2023 Prepared by: Reggy Eye  Exercises - Seated Calf Stretch with Strap  - 1 x daily - 7 x weekly - 1 sets - 3 reps - 20-30 seconds hold - Seated Hamstring Stretch  - 1 x daily - 7 x weekly - 1 sets - 3 reps - 20-30 seconds hold - Sit to Stand with  Armchair  - 1 x daily - 7 x weekly - 2 sets - 10 reps  ASSESSMENT:  CLINICAL IMPRESSION: Pt with good tolerance to progression of exercises. Some discomfort in Lt knee with sit <> stand, resolved with rest.  OBJECTIVE IMPAIRMENTS: decreased activity tolerance, decreased balance, difficulty walking, decreased ROM, decreased strength, and pain.     GOALS: Goals reviewed with patient? Yes  SHORT TERM GOALS: Target date: 06/15/2023   Pt will be independent with initial HEP Baseline: Goal status: INITIAL  2.  Pt will demo improved strength with 5 x STS <= 25 seconds Baseline:  Goal status: INITIAL   LONG TERM GOALS: Target date: 07/13/2023    Pt will be independent in advanced HEP Baseline:  Goal status: INITIAL  2.  Pt will demo improved strength with 5 x STS <= 20 seconds Baseline:  Goal status: INITIAL  3.  Pt will negotiate 1 flight of stairs without handrail with reciprocal pattern Baseline:  Goal status: INITIAL  4.  Pt will tolerate SLS x 10 seconds bilat LE to improve balance and reduce risk of falls Baseline:  Goal status: INITIAL    PLAN:  PT FREQUENCY: 1-2x/week  PT DURATION: 8 weeks  PLANNED INTERVENTIONS: 97164- PT Re-evaluation, 97110-Therapeutic exercises, 97530- Therapeutic activity, 97112- Neuromuscular re-education, 97535- Self Care, 34742- Manual therapy, [id number]- Aquatic Therapy, 97014- Electrical stimulation (unattended), 97016- Vasopneumatic device, [id number]- Ionotophoresis 4mg /ml Dexamethasone, Patient/Family education, Balance training, Taping, Dry Needling, Cryotherapy, and Moist heat  PLAN FOR NEXT SESSION: update HEP, knee and hip strength   Yeimy Brabant, PT 05/27/2023, 8:44 AM

## 2023-06-01 ENCOUNTER — Encounter: Payer: Self-pay | Admitting: Emergency Medicine

## 2023-06-01 ENCOUNTER — Ambulatory Visit
Admission: EM | Admit: 2023-06-01 | Discharge: 2023-06-01 | Disposition: A | Discharge status: Home / Self Care | Payer: 59 | Attending: Family Medicine | Admitting: Family Medicine

## 2023-06-01 ENCOUNTER — Encounter: Payer: Self-pay | Admitting: Physical Therapy

## 2023-06-01 ENCOUNTER — Ambulatory Visit: Payer: 59 | Admitting: Physical Therapy

## 2023-06-01 DIAGNOSIS — R21 Rash and other nonspecific skin eruption: Secondary | ICD-10-CM

## 2023-06-01 DIAGNOSIS — B029 Zoster without complications: Secondary | ICD-10-CM

## 2023-06-01 DIAGNOSIS — G8929 Other chronic pain: Secondary | ICD-10-CM | POA: Diagnosis not present

## 2023-06-01 DIAGNOSIS — M25562 Pain in left knee: Secondary | ICD-10-CM | POA: Diagnosis not present

## 2023-06-01 DIAGNOSIS — M6281 Muscle weakness (generalized): Secondary | ICD-10-CM | POA: Diagnosis not present

## 2023-06-01 DIAGNOSIS — R262 Difficulty in walking, not elsewhere classified: Secondary | ICD-10-CM | POA: Diagnosis not present

## 2023-06-01 DIAGNOSIS — M25561 Pain in right knee: Secondary | ICD-10-CM | POA: Diagnosis not present

## 2023-06-01 MED ORDER — VALACYCLOVIR HCL 1 G PO TABS: 1000.0000 mg | ORAL_TABLET | Freq: Three times a day (TID) | ORAL | 0 refills | Status: AC

## 2023-06-01 NOTE — Discharge Instructions (Addendum)
 Advised patient to take medication as directed with food to completion.  Encouraged to increase daily water intake to 64 ounces per day while taking this medication.  Advised if symptoms worsen and/or unresolved please follow-up with PCP or here for further evaluation.

## 2023-06-01 NOTE — Therapy (Signed)
OUTPATIENT PHYSICAL THERAPY   Patient Name: Sara Gardner MRN: [id number] DOB:1950/09/01, 72 y.o., female Today's Date: 06/01/2023  END OF SESSION:  PT End of Session - 06/01/23 1008     Visit Number 3    Number of Visits 16    Date for PT Re-Evaluation 06/28/23    Authorization Type [employer] Medicare    Authorization - Visit Number 3    Progress Note Due on Visit 10    PT Start Time 0931    PT Stop Time 1009    PT Time Calculation (min) 38 min    Activity Tolerance Patient tolerated treatment well    Behavior During Therapy [facility] for tasks assessed/performed               Past Medical History:  Diagnosis Date   Aortic atherosclerosis (HCC) 06/10/2017   CAD (coronary artery disease)    Cancer (HCC)    Combined form of age-related cataract, both eyes 04/07/2017   Dermatochalasis of both eyelids 04/07/2017   Diabetes mellitus without complication (HCC)    GERD (gastroesophageal reflux disease)    Hypertension    Hypothyroidism    Lacunar infarction (HCC) 07/20/2018   Left renal mass 10/04/2018   1.7 cm hypoechoic mass 10/04/2018   Lumbar degenerative disc disease 03/09/2017   Myocardial infarction (HCC) 2005   Normocytic anemia 12/14/2018   Palpitations    Papillary thyroid carcinoma (HCC)    Posterior vitreous detachment, right eye 04/07/2017   Past Surgical History:  Procedure Laterality Date   TOTAL THYROIDECTOMY  2016   Patient Active Problem List   Diagnosis Date Noted   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/05/2019   Cervicalgia 04/05/2019   Eustachian tube dysfunction, bilateral 04/05/2019   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   White matter abnormality on MRI of brain 04/05/2019   Myalgia due to statin 12/14/2018   Normocytic anemia 12/14/2018   At moderate risk for fall 10/03/2018   Bilateral renal cysts 10/03/2018   Facet arthropathy, lumbosacral 10/03/2018   Lacunar infarction (HCC) 07/20/2018   Controlled type 2 diabetes mellitus without  complication, without long-term current use of insulin (HCC) 03/08/2018   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/23/2017   Overactive bladder 08/17/2017   Equinus contracture of ankle 07/27/2017   Hammer toes of both feet 07/27/2017   Dermatophytosis, nail 07/27/2017   Calcaneal spur of both feet 07/27/2017   Aortic atherosclerosis (HCC) 06/10/2017   Combined form of age-related cataract, both eyes 04/07/2017   Dermatochalasis of both eyelids 04/07/2017   Posterior vitreous detachment, right eye 04/07/2017   Postural kyphosis of cervicothoracic region 03/09/2017   Lumbar degenerative disc disease 03/09/2017   Bilateral primary osteoarthritis of knee 12/15/2016   Allergy to honey bee venom 12/15/2016   Hypertension 02/16/2016   DM type 2 with diabetic dyslipidemia (HCC) 02/10/2016   Gastroesophageal reflux disease without esophagitis 02/10/2016   Left ventricular diastolic dysfunction, NYHA class 1 01/28/2016   Hypothyroidism, 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   Urticaria 12/30/2015   Diabetic sensorimotor polyneuropathy (HCC) 10/27/2015   Low back strain 10/27/2015   Right inguinal pain 04/21/2015   Healthcare maintenance 12/30/2014   Skin rash 09/16/2014   Spider veins of both lower extremities 09/16/2014   Thyroid cancer (HCC) 08/17/2014   Papillary thyroid carcinoma (HCC) 08/09/2014   Post-menopausal atrophic vaginitis 06/24/2014   Nodule of left lung 01/21/2014   Class 1 obesity due to excess calories with serious comorbidity and body mass index (BMI) of 30.0 to 30.9 in adult  06/12/2012   Palpitations 11/29/2011   Cutaneous horn 09/29/2011   Abnormal mammogram of right breast 08/30/2011   Arthritis of left knee 05/24/2011   CAD (coronary artery disease) 05/24/2011   History of acute myocardial infarction of inferior wall 05/24/2011   Gastro-esophageal reflux disease with esophagitis 05/24/2011   Hyperlipidemia 05/24/2011    PCP: Christen Butter  REFERRING PROVIDER:  Christen Butter  REFERRING DIAG: pain in bilateral lower extremities  THERAPY DIAG:  Chronic pain of both knees  Muscle weakness (generalized)  Rationale for Evaluation and Treatment: Rehabilitation  ONSET DATE: 03/2023  SUBJECTIVE:   SUBJECTIVE STATEMENT: Pt states she was able to work in her yard today without pain while working, she states she may be sore later  PERTINENT HISTORY: CAD, DM  Pt states that a month ago her legs swelled up and began burning. She states she has had difficulty with stairs and has even had to walk with a cane due to pain. She states she has pain in both knees, as well as burning feeling but only when she is at home. She states that she has had to be more cautious with walking due to increased fear of falling due to pain. PAIN:  Are you having pain? Yes: NPRS scale: 10/02/08 Pain location: bilateral knees Pain description: sore/achey Aggravating factors: stairs, walking Relieving factors: rest, voltaren  PRECAUTIONS: None  RED FLAGS: None   WEIGHT BEARING RESTRICTIONS: No  FALLS:  Has patient fallen in last 6 months? No  LIVING ENVIRONMENT: Lives with: lives alone Lives in: House/apartment Stairs: Yes: External: 5 steps; bilateral but cannot reach both Has following equipment at home: Single point cane  OCCUPATION: just retired from [employer], active with her church  PLOF: Independent  PATIENT GOALS: decrease pain  NEXT MD VISIT: PRN  OBJECTIVE:  Note: Objective measures were completed at Evaluation unless otherwise noted.  DIAGNOSTIC FINDINGS: Ultrasound: negative for DVT    COGNITION: Overall cognitive status: Within functional limits for tasks assessed     SENSATION: Pt c/o burning in bilateral LEs   MUSCLE LENGTH: Hamstrings: Right 95 deg; Left 95 deg   PALPATION: TTP bilat lateral, medial and posterior knees Patella mobility WFL  LOWER EXTREMITY ROM:  AROM ROM Right eval Left eval  Hip flexion    Hip  extension    Hip abduction    Hip adduction    Hip internal rotation    Hip external rotation    Knee flexion 109 110  Knee extension 0 0  Ankle dorsiflexion    Ankle plantarflexion    Ankle inversion    Ankle eversion     (Blank rows = not tested)  LOWER EXTREMITY MMT:  MMT Right eval Left eval  Hip flexion 4- 4  Hip extension 3+ 3+  Hip abduction 3+ 3+  Hip adduction    Hip internal rotation    Hip external rotation    Knee flexion 4- 4-  Knee extension 4 4  Ankle dorsiflexion    Ankle plantarflexion    Ankle inversion    Ankle eversion     (Blank rows = not tested)  LOWER EXTREMITY SPECIAL TESTS:  Ober's negative bilat  FUNCTIONAL TESTS:  5 times sit to stand: 30.10 seconds SLS: unable due to pain  GAIT: Stairs: pt leads with Rt LE due to Lt knee pain. Needs bilat handrails. Step to pattern   TODAY'S TREATMENT:  [facility] Adult PT Treatment:                                                DATE: 06/01/23 Therapeutic Exercise: Nustep L6 x 5 min Seated calf stretch with strap 2 x 30 sec bilat Seated HS stretch 2 x 30 sec bilat LAQ to HS curl 2 x 20 red TB SLR 3 x 10 bilat Supine clam green TB x 30 Bridge 2 x 10 Sit <> stand 2 x 5   [facility] Adult PT Treatment:                                                DATE: 05/27/23 Therapeutic Exercise: Nustep L5 x 5 min Seated calf stretch with strap 2 x 30 sec bilat Seated HS stretch 2 x 30 sec bilat SLR 2 x 10 bilat Knee flexion with LEs on physioball x 30 Supine clam green TB x 20 Sidelying hip abd 2 x 10 bilat Sit <> stand repetitions 2 x 5   PATIENT EDUCATION:  Education details: PT POC and goals, HEP Person educated: Patient Education method: Explanation, Demonstration, and Handouts Education comprehension: verbalized understanding and returned demonstration  HOME EXERCISE  PROGRAM: Access Code: [email or web address] URL: [email or web address] Date: 05/18/2023 Prepared by: Reggy Eye  Exercises - Seated Calf Stretch with Strap  - 1 x daily - 7 x weekly - 1 sets - 3 reps - 20-30 seconds hold - Seated Hamstring Stretch  - 1 x daily - 7 x weekly - 1 sets - 3 reps - 20-30 seconds hold - Sit to Stand with Armchair  - 1 x daily - 7 x weekly - 2 sets - 10 reps  ASSESSMENT:  CLINICAL IMPRESSION: Pt continues to do well with progression of exercise. Plan to add resistance to SLR and increase resistance to seated exercises next visit  OBJECTIVE IMPAIRMENTS: decreased activity tolerance, decreased balance, difficulty walking, decreased ROM, decreased strength, and pain.     GOALS: Goals reviewed with patient? Yes  SHORT TERM GOALS: Target date: 06/15/2023   Pt will be independent with initial HEP Baseline: Goal status: INITIAL  2.  Pt will demo improved strength with 5 x STS <= 25 seconds Baseline:  Goal status: INITIAL   LONG TERM GOALS: Target date: 07/13/2023    Pt will be independent in advanced HEP Baseline:  Goal status: INITIAL  2.  Pt will demo improved strength with 5 x STS <= 20 seconds Baseline:  Goal status: INITIAL  3.  Pt will negotiate 1 flight of stairs without handrail with reciprocal pattern Baseline:  Goal status: INITIAL  4.  Pt will tolerate SLS x 10 seconds bilat LE to improve balance and reduce risk of falls Baseline:  Goal status: INITIAL    PLAN:  PT FREQUENCY: 1-2x/week  PT DURATION: 8 weeks  PLANNED INTERVENTIONS: 97164- PT Re-evaluation, 97110-Therapeutic exercises, 97530- Therapeutic activity, 97112- Neuromuscular re-education, 97535- Self Care, 54098- Manual therapy, [id number]- Aquatic Therapy, 97014- Electrical stimulation (unattended), 97016- Vasopneumatic device, [id number]- Ionotophoresis 4mg /ml Dexamethasone, Patient/Family education, Balance training, Taping, Dry Needling, Cryotherapy, and Moist  heat  PLAN FOR NEXT SESSION: update HEP, knee and hip strength, increase resistance   Lanett Lasorsa, PT 06/01/2023, 10:08 AM

## 2023-06-01 NOTE — ED Triage Notes (Signed)
Pt states last Saturday she noticed lip swelling over the weekend. yesterday she developed a rash and swelling on right side of her face. She has used benadryl cream with no relief. States the area itches but denies pain.

## 2023-06-01 NOTE — ED Provider Notes (Signed)
[facility]    CSN: [id number] Arrival date & time: 06/01/23  1459      History   Chief Complaint No chief complaint on file.   HPI Sara Gardner is a 72 y.o. female.   HPI 72 year old female presents with facial swelling and rash of her right face.  PMH significant for CAD (s/p MI), HTN, and T2DM  Past Medical History:  Diagnosis Date   Aortic atherosclerosis (HCC) 06/10/2017   CAD (coronary artery disease)    Cancer (HCC)    Combined form of age-related cataract, both eyes 04/07/2017   Dermatochalasis of both eyelids 04/07/2017   Diabetes mellitus without complication (HCC)    GERD (gastroesophageal reflux disease)    Hypertension    Hypothyroidism    Lacunar infarction (HCC) 07/20/2018   Left renal mass 10/04/2018   1.7 cm hypoechoic mass 10/04/2018   Lumbar degenerative disc disease 03/09/2017   Myocardial infarction (HCC) 2005   Normocytic anemia 12/14/2018   Palpitations    Papillary thyroid carcinoma (HCC)    Posterior vitreous detachment, right eye 04/07/2017    Patient Active Problem List   Diagnosis Date Noted   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/05/2019   Cervicalgia 04/05/2019   Eustachian tube dysfunction, bilateral 04/05/2019   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   White matter abnormality on MRI of brain 04/05/2019   Myalgia due to statin 12/14/2018   Normocytic anemia 12/14/2018   At moderate risk for fall 10/03/2018   Bilateral renal cysts 10/03/2018   Facet arthropathy, lumbosacral 10/03/2018   Lacunar infarction (HCC) 07/20/2018   Controlled type 2 diabetes mellitus without complication, without long-term current use of insulin (HCC) 03/08/2018   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/23/2017   Overactive bladder 08/17/2017   Equinus contracture of ankle 07/27/2017   Hammer toes of both feet 07/27/2017   Dermatophytosis, nail 07/27/2017   Calcaneal spur of both feet 07/27/2017   Aortic atherosclerosis  (HCC) 06/10/2017   Combined form of age-related cataract, both eyes 04/07/2017   Dermatochalasis of both eyelids 04/07/2017   Posterior vitreous detachment, right eye 04/07/2017   Postural kyphosis of cervicothoracic region 03/09/2017   Lumbar degenerative disc disease 03/09/2017   Bilateral primary osteoarthritis of knee 12/15/2016   Allergy to honey bee venom 12/15/2016   Hypertension 02/16/2016   DM type 2 with diabetic dyslipidemia (HCC) 02/10/2016   Gastroesophageal reflux disease without esophagitis 02/10/2016   Left ventricular diastolic dysfunction, NYHA class 1 01/28/2016   Hypothyroidism, 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   Urticaria 12/30/2015   Diabetic sensorimotor polyneuropathy (HCC) 10/27/2015   Low back strain 10/27/2015   Right inguinal pain 04/21/2015   Healthcare maintenance 12/30/2014   Skin rash 09/16/2014   Spider veins of both lower extremities 09/16/2014   Thyroid cancer (HCC) 08/17/2014   Papillary thyroid carcinoma (HCC) 08/09/2014   Post-menopausal atrophic vaginitis 06/24/2014   Nodule of left lung 01/21/2014   Class 1 obesity due to excess calories with serious comorbidity and body mass index (BMI) of 30.0 to 30.9 in adult 06/12/2012   Palpitations 11/29/2011   Cutaneous horn 09/29/2011   Abnormal mammogram of right breast 08/30/2011   Arthritis of left knee 05/24/2011   CAD (coronary artery disease) 05/24/2011   History of acute myocardial infarction of inferior wall 05/24/2011   Gastro-esophageal reflux disease with esophagitis 05/24/2011   Hyperlipidemia 05/24/2011    Past Surgical History:  Procedure Laterality Date   TOTAL THYROIDECTOMY  2016    OB History   No obstetric  history on file.      Home Medications    Prior to Admission medications   Medication Sig Start Date End Date Taking? Authorizing Provider  valACYclovir (VALTREX) 1000 MG tablet Take 1 tablet (1,000 mg total) by mouth 3 (three) times daily. 06/01/23  Yes Trevor Iha, FNP   Accu-Chek Softclix Lancets lancets Use as instructed. Accu-chek Guide 10/23/21   Christen Butter, NP  AMBULATORY NON FORMULARY MEDICATION Knee-high, medium compression, graduated compression stockings. Apply to lower extremities. [email or web address], Zippered Compression Stockings, medium circ, long length 12/15/16   Carlis Stable, PA-C  ammonium lactate (LAC-HYDRIN) 12 % lotion APPLY TOPICALLY TO THE AFFECTED AREA AS NEEDED FOR DRY SKIN 02/10/22   Christen Butter, NP  ASPIRIN PO Take 81 mg by mouth daily.    [provider]  blood glucose meter kit and supplies Dispense based on patient and insurance preference. Check daily. (FOR ICD-10 E10.9, E11.9). 10/23/21   Christen Butter, NP  dapagliflozin propanediol (FARXIGA) 5 MG TABS tablet Take 1 tablet (5 mg total) by mouth daily. NEEDS APPOINTMENT FOR FURTHER REFILLS. 03/21/23   Christen Butter, NP  DICLOFENAC SODIUM EX Apply topically as needed.    [provider]  Dulaglutide (TRULICITY) 0.75 MG/0.5ML SOPN Inject 1.5 mg into the skin once a week. 10/08/22   Christen Butter, NP  esomeprazole (NEXIUM) 20 MG capsule Take 1 capsule (20 mg total) by mouth daily at 12 noon. TAKE 1 CAPSULE BY MOUTH DAILY AT 12:00 NOON 05/18/23   Christen Butter, NP  fluticasone (FLONASE) 50 MCG/ACT nasal spray Place into both nostrils as needed for allergies or rhinitis.    [provider]  glucose blood (ACCU-CHEK GUIDE) test strip USE AS INSTRUCTED TO TEST ONCE DAILY 11/25/22   Christen Butter, NP  isosorbide mononitrate (IMDUR) 60 MG 24 hr tablet TAKE 1 TABLET(60 MG) BY MOUTH DAILY 05/18/23   Lewayne Bunting, MD  levothyroxine (SYNTHROID) 75 MCG tablet TAKE 1 TABLET(75 MCG) BY MOUTH DAILY BEFORE BREAKFAST 10/25/22   Christen Butter, NP  lisinopril-hydrochlorothiazide (ZESTORETIC) 20-12.5 MG tablet Take 1 tablet by mouth daily. 05/21/22   Lewayne Bunting, MD  metFORMIN (GLUCOPHAGE) 1000 MG tablet TAKE 1 TABLET(1000 MG) BY MOUTH EVERY MORNING 12/23/22   Christen Butter,  NP  metoprolol succinate (TOPROL-XL) 50 MG 24 hr tablet TAKE 1 TABLET(50 MG) BY MOUTH DAILY WITH OR IMMEDIATELY FOLLOWING A MEAL 05/21/22   Crenshaw, Madolyn Frieze, MD  nitroGLYCERIN (NITROSTAT) 0.4 MG SL tablet DISSOLVE 1 TABLET UNDER THE TONGUE EVERY 5 MINUTES, AS NEEDED FOR CHEST PAIN. MAX 3 TABLETS IN 15 MINUTES 09/07/21   Lewayne Bunting, MD  rosuvastatin (CRESTOR) 40 MG tablet TAKE 1 TABLET(40 MG) BY MOUTH AT BEDTIME 10/26/22   Christen Butter, NP  triamcinolone cream (KENALOG) 0.1 %  10/21/15   [provider]  VITAMIN D PO Take by mouth.    [provider]    Family History Family History  Problem Relation Age of Onset   Hypertension Mother    Hypertension Sister    Hypertension Brother    Breast cancer Daughter    Hypertension Sister     Social History Social History   Tobacco Use   Smoking status: Former   Smokeless tobacco: Never  Advertising account planner   Vaping status: Never Used  Substance Use Topics   Alcohol use: No   Drug use: No     Allergies   Bee venom, Tape, and Tuberculin tests   Review of Systems Review of Systems  HENT:  Positive for facial swelling.   Skin:  Positive for rash.     Physical Exam Triage Vital Signs ED Triage Vitals  Encounter Vitals Group     BP 06/01/23 1517 108/72     Systolic BP Percentile --      Diastolic BP Percentile --      Pulse Rate 06/01/23 1517 78     Resp --      Temp 06/01/23 1517 97.9 F (36.6 C)     Temp Source 06/01/23 1517 Oral     SpO2 06/01/23 1517 98 %     Weight --      Height --      Head Circumference --      Peak Flow --      Pain Score 06/01/23 1519 0     Pain Loc --      Pain Education --      Exclude from Growth Chart --    No data found.  Updated Vital Signs BP 108/72 (BP Location: Right Arm)   Pulse 78   Temp 97.9 F (36.6 C) (Oral)   SpO2 98%    Physical Exam Vitals and nursing note reviewed.  Constitutional:      Appearance: Normal appearance. She is obese.  HENT:     Head:  Normocephalic and atraumatic.     Right Ear: Tympanic membrane, ear canal and external ear normal.     Left Ear: Tympanic membrane, ear canal and external ear normal.     Nose: Nose normal.     Mouth/Throat:     Mouth: Mucous membranes are moist.     Pharynx: Oropharynx is clear.  Eyes:     Extraocular Movements: Extraocular movements intact.     Conjunctiva/sclera: Conjunctivae normal.     Pupils: Pupils are equal, round, and reactive to light.  Cardiovascular:     Rate and Rhythm: Normal rate and regular rhythm.     Pulses: Normal pulses.     Heart sounds: Normal heart sounds.  Pulmonary:     Effort: Pulmonary effort is normal.     Breath sounds: Normal breath sounds. No wheezing, rhonchi or rales.  Musculoskeletal:        General: Normal range of motion.     Cervical back: Normal range of motion and neck supple.  Skin:    General: Skin is warm and dry.     Comments: Face (right side of cheek): Grouped mildly erythematous vesicular lesions noted-please see image below  Neurological:     General: No focal deficit present.     Mental Status: She is alert and oriented to person, place, and time. Mental status is at baseline.  Psychiatric:        Mood and Affect: Mood normal.        Behavior: Behavior normal.      UC Treatments / Results  Labs (all labs ordered are listed, but only abnormal results are displayed) Labs Reviewed - No data to display  EKG   Radiology No results found.  Procedures Procedures (including critical care time)  Medications Ordered in UC Medications - No data to display  Initial Impression / Assessment and Plan / UC Course  I have reviewed the triage vital signs and the nursing notes.  Pertinent labs & imaging results that were available during my care of the patient were reviewed by me and considered in my medical decision making (see chart for details).     MDM: 1.  Herpes zoster without complication-Rx'd Valtrex 1000 mg tablet: Take 1  tablet by mouth 3 times daily for the next 7 days.;  2.  Rash and nonspecific skin eruption-grouped vesicular lesions are herpetic in appearance, Rx'd Valtrex 1000 mg tablet: Take 1 tablet by mouth 3 times daily for the next  7 days. Advised patient to take medication as directed with food to completion.  Encouraged to increase daily water intake to 64 ounces per day while taking this medication.  Advised if symptoms worsen and/or unresolved please follow-up with PCP or here for further evaluation.  Patient discharged home, hemodynamically stable. Final Clinical Impressions(s) / UC Diagnoses   Final diagnoses:  Herpes zoster without complication  Rash and nonspecific skin eruption     Discharge Instructions      Advised patient to take medication as directed with food to completion.  Encouraged to increase daily water intake to 64 ounces per day while taking this medication.  Advised if symptoms worsen and/or unresolved please follow-up with PCP or here for further evaluation.     ED Prescriptions     Medication Sig Dispense Auth. Provider   valACYclovir (VALTREX) 1000 MG tablet Take 1 tablet (1,000 mg total) by mouth 3 (three) times daily. 21 tablet Trevor Iha, FNP      PDMP not reviewed this encounter.   Trevor Iha, FNP 06/01/23 [id number]

## 2023-06-03 ENCOUNTER — Ambulatory Visit: Payer: 59 | Admitting: Physical Therapy

## 2023-06-08 ENCOUNTER — Ambulatory Visit: Payer: 59 | Admitting: Physical Therapy

## 2023-06-08 DIAGNOSIS — R262 Difficulty in walking, not elsewhere classified: Secondary | ICD-10-CM | POA: Diagnosis not present

## 2023-06-08 DIAGNOSIS — G8929 Other chronic pain: Secondary | ICD-10-CM | POA: Diagnosis not present

## 2023-06-08 DIAGNOSIS — M25562 Pain in left knee: Secondary | ICD-10-CM | POA: Diagnosis not present

## 2023-06-08 DIAGNOSIS — M6281 Muscle weakness (generalized): Secondary | ICD-10-CM

## 2023-06-08 DIAGNOSIS — M25561 Pain in right knee: Secondary | ICD-10-CM | POA: Diagnosis not present

## 2023-06-08 NOTE — Therapy (Signed)
OUTPATIENT PHYSICAL THERAPY TREATMENT   Patient Name: Sara Gardner MRN: [id number] DOB:April 15, 1951, 72 y.o., female Today's Date: 06/08/2023  END OF SESSION:  PT End of Session - 06/08/23 1530     Visit Number 4    Number of Visits 16    Date for PT Re-Evaluation 06/28/23    Authorization Type [employer] Medicare    Progress Note Due on Visit 10    PT Start Time 1530    PT Stop Time 1612    PT Time Calculation (min) 42 min    Activity Tolerance Patient tolerated treatment well    Behavior During Therapy WFL for tasks assessed/performed                Past Medical History:  Diagnosis Date   Aortic atherosclerosis (HCC) 06/10/2017   CAD (coronary artery disease)    Cancer (HCC)    Combined form of age-related cataract, both eyes 04/07/2017   Dermatochalasis of both eyelids 04/07/2017   Diabetes mellitus without complication (HCC)    GERD (gastroesophageal reflux disease)    Hypertension    Hypothyroidism    Lacunar infarction (HCC) 07/20/2018   Left renal mass 10/04/2018   1.7 cm hypoechoic mass 10/04/2018   Lumbar degenerative disc disease 03/09/2017   Myocardial infarction (HCC) 2005   Normocytic anemia 12/14/2018   Palpitations    Papillary thyroid carcinoma (HCC)    Posterior vitreous detachment, right eye 04/07/2017   Past Surgical History:  Procedure Laterality Date   TOTAL THYROIDECTOMY  2016   Patient Active Problem List   Diagnosis Date Noted   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/05/2019   Cervicalgia 04/05/2019   Eustachian tube dysfunction, bilateral 04/05/2019   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   White matter abnormality on MRI of brain 04/05/2019   Myalgia due to statin 12/14/2018   Normocytic anemia 12/14/2018   At moderate risk for fall 10/03/2018   Bilateral renal cysts 10/03/2018   Facet arthropathy, lumbosacral 10/03/2018   Lacunar infarction (HCC) 07/20/2018   Controlled type 2 diabetes mellitus without complication, without  long-term current use of insulin (HCC) 03/08/2018   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/23/2017   Overactive bladder 08/17/2017   Equinus contracture of ankle 07/27/2017   Hammer toes of both feet 07/27/2017   Dermatophytosis, nail 07/27/2017   Calcaneal spur of both feet 07/27/2017   Aortic atherosclerosis (HCC) 06/10/2017   Combined form of age-related cataract, both eyes 04/07/2017   Dermatochalasis of both eyelids 04/07/2017   Posterior vitreous detachment, right eye 04/07/2017   Postural kyphosis of cervicothoracic region 03/09/2017   Lumbar degenerative disc disease 03/09/2017   Bilateral primary osteoarthritis of knee 12/15/2016   Allergy to honey bee venom 12/15/2016   Hypertension 02/16/2016   DM type 2 with diabetic dyslipidemia (HCC) 02/10/2016   Gastroesophageal reflux disease without esophagitis 02/10/2016   Left ventricular diastolic dysfunction, NYHA class 1 01/28/2016   Hypothyroidism, 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   Urticaria 12/30/2015   Diabetic sensorimotor polyneuropathy (HCC) 10/27/2015   Low back strain 10/27/2015   Right inguinal pain 04/21/2015   Healthcare maintenance 12/30/2014   Skin rash 09/16/2014   Spider veins of both lower extremities 09/16/2014   Thyroid cancer (HCC) 08/17/2014   Papillary thyroid carcinoma (HCC) 08/09/2014   Post-menopausal atrophic vaginitis 06/24/2014   Nodule of left lung 01/21/2014   Class 1 obesity due to excess calories with serious comorbidity and body mass index (BMI) of 30.0 to 30.9 in adult 06/12/2012   Palpitations 11/29/2011  Cutaneous horn 09/29/2011   Abnormal mammogram of right breast 08/30/2011   Arthritis of left knee 05/24/2011   CAD (coronary artery disease) 05/24/2011   History of acute myocardial infarction of inferior wall 05/24/2011   Gastro-esophageal reflux disease with esophagitis 05/24/2011   Hyperlipidemia 05/24/2011    PCP: Christen Butter  REFERRING PROVIDER: Christen Butter  REFERRING  DIAG: pain in bilateral lower extremities  THERAPY DIAG:  Chronic pain of both knees  Muscle weakness (generalized)  Difficulty in walking, not elsewhere classified  Rationale for Evaluation and Treatment: Rehabilitation  ONSET DATE: 03/2023  SUBJECTIVE:   SUBJECTIVE STATEMENT: "My knees are trying to work, but still not working like they should."  PERTINENT HISTORY: CAD, DM  Pt states that a month ago her legs swelled up and began burning. She states she has had difficulty with stairs and has even had to walk with a cane due to pain. She states she has pain in both knees, as well as burning feeling but only when she is at home. She states that she has had to be more cautious with walking due to increased fear of falling due to pain. PAIN:  Are you having pain? Yes: NPRS scale: 7/10 Pain location: bilateral knees Pain description: sore/achey Aggravating factors: stairs, walking Relieving factors: rest, voltaren  PRECAUTIONS: None  RED FLAGS: None   WEIGHT BEARING RESTRICTIONS: No  FALLS:  Has patient fallen in last 6 months? No  LIVING ENVIRONMENT: Lives with: lives alone Lives in: House/apartment Stairs: Yes: External: 5 steps; bilateral but cannot reach both Has following equipment at home: Single point cane  OCCUPATION: just retired from [employer], active with her church  PLOF: Independent  PATIENT GOALS: decrease pain  NEXT MD VISIT: PRN  OBJECTIVE:  Note: Objective measures were completed at Evaluation unless otherwise noted.  DIAGNOSTIC FINDINGS: Ultrasound: negative for DVT    COGNITION: Overall cognitive status: Within functional limits for tasks assessed     SENSATION: Pt c/o burning in bilateral LEs   MUSCLE LENGTH: Hamstrings: Right 95 deg; Left 95 deg   PALPATION: TTP bilat lateral, medial and posterior knees Patella mobility WFL  LOWER EXTREMITY ROM:  AROM ROM Right eval Left eval 06/08/23  Hip flexion     Hip extension      Hip abduction     Hip adduction     Hip internal rotation     Hip external rotation     Knee flexion 109 110 Lt: 112; Rt:113   Knee extension 0 0   Ankle dorsiflexion     Ankle plantarflexion     Ankle inversion     Ankle eversion      (Blank rows = not tested)  LOWER EXTREMITY MMT:  MMT Right eval Left eval  Hip flexion 4- 4  Hip extension 3+ 3+  Hip abduction 3+ 3+  Hip adduction    Hip internal rotation    Hip external rotation    Knee flexion 4- 4-  Knee extension 4 4  Ankle dorsiflexion    Ankle plantarflexion    Ankle inversion    Ankle eversion     (Blank rows = not tested)  LOWER EXTREMITY SPECIAL TESTS:  Ober's negative bilat  FUNCTIONAL TESTS:  5 times sit to stand: 30.10 seconds SLS: unable due to pain  GAIT: Stairs: pt leads with Rt LE due to Lt knee pain. Needs bilat handrails. Step to pattern   TODAY'S TREATMENT:       [facility] Adult PT  Treatment:                                                DATE: 06/08/23 Therapeutic Exercise: NuStep level 6 x 5 minutes  LAQ 2 x 10 @ 1.5 lbs  Resisted HS curl green band 2 x 10  Hip bridge with abduction green band 2 x 10  SLR 1.5# d/c due to pain Sidelying hip abduction 2 x 10  Prone quad stretch x 30 sec each                                                                                                                            [facility] Adult PT Treatment:                                                DATE: 06/01/23 Therapeutic Exercise: Nustep L6 x 5 min Seated calf stretch with strap 2 x 30 sec bilat Seated HS stretch 2 x 30 sec bilat LAQ to HS curl 2 x 20 red TB SLR 3 x 10 bilat Supine clam green TB x 30 Bridge 2 x 10 Sit <> stand 2 x 5   [facility] Adult PT Treatment:                                                DATE: 05/27/23 Therapeutic Exercise: Nustep L5 x 5 min Seated calf stretch with strap 2 x 30 sec bilat Seated HS stretch 2 x 30 sec bilat SLR 2 x 10 bilat Knee flexion with LEs on  physioball x 30 Supine clam green TB x 20 Sidelying hip abd 2 x 10 bilat Sit <> stand repetitions 2 x 5   PATIENT EDUCATION:  Education details: HEP review  Person educated: Patient Education method: Explanation Education comprehension: verbalized understanding  HOME EXERCISE PROGRAM: Access Code: [email or web address] URL: [email or web address] Date: 05/18/2023 Prepared by: Reggy Eye  Exercises - Seated Calf Stretch with Strap  - 1 x daily - 7 x weekly - 1 sets - 3 reps - 20-30 seconds hold - Seated Hamstring Stretch  - 1 x daily - 7 x weekly - 1 sets - 3 reps - 20-30 seconds hold - Sit to Stand with Armchair  - 1 x daily - 7 x weekly - 2 sets - 10 reps  ASSESSMENT:  CLINICAL IMPRESSION: Continued with progression of hip and knee strengthening with fairly good tolerance with exception of SLR with resistance on the LLE as this caused increased pain and was discontinued. With LAQ on the LLE she is only able  to perform through partial range secondary to pain. Knee flexion AROM remains relatively unchanged compared to initial evaluation.   OBJECTIVE IMPAIRMENTS: decreased activity tolerance, decreased balance, difficulty walking, decreased ROM, decreased strength, and pain.     GOALS: Goals reviewed with patient? Yes  SHORT TERM GOALS: Target date: 06/15/2023   Pt will be independent with initial HEP Baseline: Goal status: INITIAL  2.  Pt will demo improved strength with 5 x STS <= 25 seconds Baseline:  Goal status: INITIAL   LONG TERM GOALS: Target date: 07/13/2023    Pt will be independent in advanced HEP Baseline:  Goal status: INITIAL  2.  Pt will demo improved strength with 5 x STS <= 20 seconds Baseline:  Goal status: INITIAL  3.  Pt will negotiate 1 flight of stairs without handrail with reciprocal pattern Baseline:  Goal status: INITIAL  4.  Pt will tolerate SLS x 10 seconds bilat LE to improve balance and reduce risk of falls Baseline:   Goal status: INITIAL    PLAN:  PT FREQUENCY: 1-2x/week  PT DURATION: 8 weeks  PLANNED INTERVENTIONS: 97164- PT Re-evaluation, 97110-Therapeutic exercises, 97530- Therapeutic activity, 97112- Neuromuscular re-education, 97535- Self Care, 16109- Manual therapy, [id number]- Aquatic Therapy, 97014- Electrical stimulation (unattended), 97016- Vasopneumatic device, 97033- Ionotophoresis 4mg /ml Dexamethasone, Patient/Family education, Balance training, Taping, Dry Needling, Cryotherapy, and Moist heat  PLAN FOR NEXT SESSION: update HEP, knee and hip strength, increase resistance as tolerated    Letitia Libra, PT, DPT, ATC 06/08/23 4:14 PM

## 2023-06-10 ENCOUNTER — Encounter: Payer: Self-pay | Admitting: Physical Therapy

## 2023-06-10 ENCOUNTER — Ambulatory Visit: Payer: 59 | Admitting: Physical Therapy

## 2023-06-10 DIAGNOSIS — M6281 Muscle weakness (generalized): Secondary | ICD-10-CM | POA: Diagnosis not present

## 2023-06-10 DIAGNOSIS — M25562 Pain in left knee: Secondary | ICD-10-CM | POA: Diagnosis not present

## 2023-06-10 DIAGNOSIS — R262 Difficulty in walking, not elsewhere classified: Secondary | ICD-10-CM | POA: Diagnosis not present

## 2023-06-10 DIAGNOSIS — M25561 Pain in right knee: Secondary | ICD-10-CM | POA: Diagnosis not present

## 2023-06-10 DIAGNOSIS — G8929 Other chronic pain: Secondary | ICD-10-CM | POA: Diagnosis not present

## 2023-06-10 NOTE — Therapy (Signed)
OUTPATIENT PHYSICAL THERAPY TREATMENT   Patient Name: Sara Gardner MRN: [id number] DOB:07/10/1951, 72 y.o., female Today's Date: 06/10/2023  END OF SESSION:  PT End of Session - 06/10/23 1005     Visit Number 5    Number of Visits 16    Date for PT Re-Evaluation 06/28/23    Authorization Type [employer] Medicare    Authorization - Visit Number 5    Progress Note Due on Visit 10    PT Start Time 0930    PT Stop Time 1009    PT Time Calculation (min) 39 min    Activity Tolerance Patient tolerated treatment well    Behavior During Therapy WFL for tasks assessed/performed                 Past Medical History:  Diagnosis Date   Aortic atherosclerosis (HCC) 06/10/2017   CAD (coronary artery disease)    Cancer (HCC)    Combined form of age-related cataract, both eyes 04/07/2017   Dermatochalasis of both eyelids 04/07/2017   Diabetes mellitus without complication (HCC)    GERD (gastroesophageal reflux disease)    Hypertension    Hypothyroidism    Lacunar infarction (HCC) 07/20/2018   Left renal mass 10/04/2018   1.7 cm hypoechoic mass 10/04/2018   Lumbar degenerative disc disease 03/09/2017   Myocardial infarction (HCC) 2005   Normocytic anemia 12/14/2018   Palpitations    Papillary thyroid carcinoma (HCC)    Posterior vitreous detachment, right eye 04/07/2017   Past Surgical History:  Procedure Laterality Date   TOTAL THYROIDECTOMY  2016   Patient Active Problem List   Diagnosis Date Noted   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/05/2019   Cervicalgia 04/05/2019   Eustachian tube dysfunction, bilateral 04/05/2019   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   White matter abnormality on MRI of brain 04/05/2019   Myalgia due to statin 12/14/2018   Normocytic anemia 12/14/2018   At moderate risk for fall 10/03/2018   Bilateral renal cysts 10/03/2018   Facet arthropathy, lumbosacral 10/03/2018   Lacunar infarction (HCC) 07/20/2018   Controlled type 2 diabetes  mellitus without complication, without long-term current use of insulin (HCC) 03/08/2018   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/23/2017   Overactive bladder 08/17/2017   Equinus contracture of ankle 07/27/2017   Hammer toes of both feet 07/27/2017   Dermatophytosis, nail 07/27/2017   Calcaneal spur of both feet 07/27/2017   Aortic atherosclerosis (HCC) 06/10/2017   Combined form of age-related cataract, both eyes 04/07/2017   Dermatochalasis of both eyelids 04/07/2017   Posterior vitreous detachment, right eye 04/07/2017   Postural kyphosis of cervicothoracic region 03/09/2017   Lumbar degenerative disc disease 03/09/2017   Bilateral primary osteoarthritis of knee 12/15/2016   Allergy to honey bee venom 12/15/2016   Hypertension 02/16/2016   DM type 2 with diabetic dyslipidemia (HCC) 02/10/2016   Gastroesophageal reflux disease without esophagitis 02/10/2016   Left ventricular diastolic dysfunction, NYHA class 1 01/28/2016   Hypothyroidism, 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   Urticaria 12/30/2015   Diabetic sensorimotor polyneuropathy (HCC) 10/27/2015   Low back strain 10/27/2015   Right inguinal pain 04/21/2015   Healthcare maintenance 12/30/2014   Skin rash 09/16/2014   Spider veins of both lower extremities 09/16/2014   Thyroid cancer (HCC) 08/17/2014   Papillary thyroid carcinoma (HCC) 08/09/2014   Post-menopausal atrophic vaginitis 06/24/2014   Nodule of left lung 01/21/2014   Class 1 obesity due to excess calories with serious comorbidity and body mass index (BMI) of 30.0 to  30.9 in adult 06/12/2012   Palpitations 11/29/2011   Cutaneous horn 09/29/2011   Abnormal mammogram of right breast 08/30/2011   Arthritis of left knee 05/24/2011   CAD (coronary artery disease) 05/24/2011   History of acute myocardial infarction of inferior wall 05/24/2011   Gastro-esophageal reflux disease with esophagitis 05/24/2011   Hyperlipidemia 05/24/2011    PCP: Christen Butter  REFERRING PROVIDER: Christen Butter  REFERRING DIAG: pain in bilateral lower extremities  THERAPY DIAG:  Chronic pain of both knees  Muscle weakness (generalized)  Rationale for Evaluation and Treatment: Rehabilitation  ONSET DATE: 03/2023  SUBJECTIVE:   SUBJECTIVE STATEMENT: "My knees are a little sore today, I was busy yesterday"  PERTINENT HISTORY: CAD, DM  Pt states that a month ago her legs swelled up and began burning. She states she has had difficulty with stairs and has even had to walk with a cane due to pain. She states she has pain in both knees, as well as burning feeling but only when she is at home. She states that she has had to be more cautious with walking due to increased fear of falling due to pain. PAIN:  Are you having pain? Yes: NPRS scale: 4/10 Pain location: bilateral knees Pain description: sore/achey Aggravating factors: stairs, walking Relieving factors: rest, voltaren  PRECAUTIONS: None  RED FLAGS: None   WEIGHT BEARING RESTRICTIONS: No  FALLS:  Has patient fallen in last 6 months? No  LIVING ENVIRONMENT: Lives with: lives alone Lives in: House/apartment Stairs: Yes: External: 5 steps; bilateral but cannot reach both Has following equipment at home: Single point cane  OCCUPATION: just retired from [employer], active with her church  PLOF: Independent  PATIENT GOALS: decrease pain  NEXT MD VISIT: PRN  OBJECTIVE:  Note: Objective measures were completed at Evaluation unless otherwise noted.  DIAGNOSTIC FINDINGS: Ultrasound: negative for DVT    COGNITION: Overall cognitive status: Within functional limits for tasks assessed     SENSATION: Pt c/o burning in bilateral LEs   MUSCLE LENGTH: Hamstrings: Right 95 deg; Left 95 deg   PALPATION: TTP bilat lateral, medial and posterior knees Patella mobility WFL  LOWER EXTREMITY ROM:  AROM ROM Right eval Left eval 06/08/23  Hip flexion     Hip extension     Hip  abduction     Hip adduction     Hip internal rotation     Hip external rotation     Knee flexion 109 110 Lt: 112; Rt:113   Knee extension 0 0   Ankle dorsiflexion     Ankle plantarflexion     Ankle inversion     Ankle eversion      (Blank rows = not tested)  LOWER EXTREMITY MMT:  MMT Right eval Left eval  Hip flexion 4- 4  Hip extension 3+ 3+  Hip abduction 3+ 3+  Hip adduction    Hip internal rotation    Hip external rotation    Knee flexion 4- 4-  Knee extension 4 4  Ankle dorsiflexion    Ankle plantarflexion    Ankle inversion    Ankle eversion     (Blank rows = not tested)  LOWER EXTREMITY SPECIAL TESTS:  Ober's negative bilat  FUNCTIONAL TESTS:  5 times sit to stand: 30.10 seconds SLS: unable due to pain  GAIT: Stairs: pt leads with Rt LE due to Lt knee pain. Needs bilat handrails. Step to pattern   TODAY'S TREATMENT:      [facility] Adult PT  Treatment:                                                DATE: 06/10/23 Therapeutic Exercise: Nustep L6 x 5 min LAQ 1.5# x 20 bilat HS curl green TB x 20 bilat Standing hip abd red TB x 15 bilat Standing hip ext red TB x 15 bilat SLR 2 x 10 bilat Bridge 2 x 10 Sidelying hip abd 2 x 10   [facility] Adult PT Treatment:                                                DATE: 06/08/23 Therapeutic Exercise: NuStep level 6 x 5 minutes  LAQ 2 x 10 @ 1.5 lbs  Resisted HS curl green band 2 x 10  Hip bridge with abduction green band 2 x 10  SLR 1.5# d/c due to pain Sidelying hip abduction 2 x 10  Prone quad stretch x 30 sec each                                                                                                                            [facility] Adult PT Treatment:                                                DATE: 06/01/23 Therapeutic Exercise: Nustep L6 x 5 min Seated calf stretch with strap 2 x 30 sec bilat Seated HS stretch 2 x 30 sec bilat LAQ to HS curl 2 x 20 red TB SLR 3 x 10 bilat Supine clam green TB x  30 Bridge 2 x 10 Sit <> stand 2 x 5   PATIENT EDUCATION:  Education details: HEP review  Person educated: Patient Education method: Explanation Education comprehension: verbalized understanding  HOME EXERCISE PROGRAM: Access Code: [email or web address] URL: [email or web address] Date: 05/18/2023 Prepared by: Reggy Eye  Exercises - Seated Calf Stretch with Strap  - 1 x daily - 7 x weekly - 1 sets - 3 reps - 20-30 seconds hold - Seated Hamstring Stretch  - 1 x daily - 7 x weekly - 1 sets - 3 reps - 20-30 seconds hold - Sit to Stand with Armchair  - 1 x daily - 7 x weekly - 2 sets - 10 reps  ASSESSMENT:  CLINICAL IMPRESSION: Progressed to standing exercise to work towards more functional mobility goals. Some increased pain standing on Lt LE when exercising Rt, pain decreased with rest  OBJECTIVE IMPAIRMENTS: decreased activity tolerance, decreased balance, difficulty walking, decreased ROM, decreased strength, and pain.     GOALS:  Goals reviewed with patient? Yes  SHORT TERM GOALS: Target date: 06/15/2023   Pt will be independent with initial HEP Baseline: Goal status: INITIAL  2.  Pt will demo improved strength with 5 x STS <= 25 seconds Baseline:  Goal status: INITIAL   LONG TERM GOALS: Target date: 07/13/2023    Pt will be independent in advanced HEP Baseline:  Goal status: INITIAL  2.  Pt will demo improved strength with 5 x STS <= 20 seconds Baseline:  Goal status: INITIAL  3.  Pt will negotiate 1 flight of stairs without handrail with reciprocal pattern Baseline:  Goal status: INITIAL  4.  Pt will tolerate SLS x 10 seconds bilat LE to improve balance and reduce risk of falls Baseline:  Goal status: INITIAL    PLAN:  PT FREQUENCY: 1-2x/week  PT DURATION: 8 weeks  PLANNED INTERVENTIONS: 97164- PT Re-evaluation, 97110-Therapeutic exercises, 97530- Therapeutic activity, 97112- Neuromuscular re-education, 97535- Self Care, 16109- Manual  therapy, [id number]- Aquatic Therapy, 97014- Electrical stimulation (unattended), 97016- Vasopneumatic device, [id number]- Ionotophoresis 4mg /ml Dexamethasone, Patient/Family education, Balance training, Taping, Dry Needling, Cryotherapy, and Moist heat  PLAN FOR NEXT SESSION: update HEP, knee and hip strength, increase resistance as tolerated    Letitia Libra, PT, DPT, ATC 06/10/23 10:07 AM

## 2023-06-15 ENCOUNTER — Ambulatory Visit: Payer: 59 | Admitting: Physical Therapy

## 2023-06-15 DIAGNOSIS — M2041 Other hammer toe(s) (acquired), right foot: Secondary | ICD-10-CM | POA: Diagnosis not present

## 2023-06-15 DIAGNOSIS — M2042 Other hammer toe(s) (acquired), left foot: Secondary | ICD-10-CM | POA: Diagnosis not present

## 2023-06-15 DIAGNOSIS — L851 Acquired keratosis [keratoderma] palmaris et plantaris: Secondary | ICD-10-CM | POA: Diagnosis not present

## 2023-06-15 DIAGNOSIS — E1142 Type 2 diabetes mellitus with diabetic polyneuropathy: Secondary | ICD-10-CM | POA: Diagnosis not present

## 2023-06-15 DIAGNOSIS — B351 Tinea unguium: Secondary | ICD-10-CM | POA: Diagnosis not present

## 2023-06-17 ENCOUNTER — Ambulatory Visit: Payer: 59 | Admitting: Physical Therapy

## 2023-06-17 ENCOUNTER — Encounter: Payer: Self-pay | Admitting: Physical Therapy

## 2023-06-17 DIAGNOSIS — M6281 Muscle weakness (generalized): Secondary | ICD-10-CM | POA: Diagnosis not present

## 2023-06-17 DIAGNOSIS — M25561 Pain in right knee: Secondary | ICD-10-CM | POA: Diagnosis not present

## 2023-06-17 DIAGNOSIS — R262 Difficulty in walking, not elsewhere classified: Secondary | ICD-10-CM | POA: Diagnosis not present

## 2023-06-17 DIAGNOSIS — G8929 Other chronic pain: Secondary | ICD-10-CM

## 2023-06-17 DIAGNOSIS — M25562 Pain in left knee: Secondary | ICD-10-CM | POA: Diagnosis not present

## 2023-06-17 NOTE — Therapy (Signed)
OUTPATIENT PHYSICAL THERAPY TREATMENT   Patient Name: Sara Gardner MRN: [id number] DOB:May 02, 1951, 72 y.o., female Today's Date: 06/17/2023  END OF SESSION:  PT End of Session - 06/17/23 1005     Visit Number 6    Number of Visits 16    Date for PT Re-Evaluation 06/28/23    Authorization Type [employer] Medicare    Authorization - Visit Number 6    Progress Note Due on Visit 10    PT Start Time 0929    PT Stop Time 1007    PT Time Calculation (min) 38 min    Activity Tolerance Patient tolerated treatment well    Behavior During Therapy [facility] for tasks assessed/performed                  Past Medical History:  Diagnosis Date   Aortic atherosclerosis (HCC) 06/10/2017   CAD (coronary artery disease)    Cancer (HCC)    Combined form of age-related cataract, both eyes 04/07/2017   Dermatochalasis of both eyelids 04/07/2017   Diabetes mellitus without complication (HCC)    GERD (gastroesophageal reflux disease)    Hypertension    Hypothyroidism    Lacunar infarction (HCC) 07/20/2018   Left renal mass 10/04/2018   1.7 cm hypoechoic mass 10/04/2018   Lumbar degenerative disc disease 03/09/2017   Myocardial infarction (HCC) 2005   Normocytic anemia 12/14/2018   Palpitations    Papillary thyroid carcinoma (HCC)    Posterior vitreous detachment, right eye 04/07/2017   Past Surgical History:  Procedure Laterality Date   TOTAL THYROIDECTOMY  2016   Patient Active Problem List   Diagnosis Date Noted   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/05/2019   Cervicalgia 04/05/2019   Eustachian tube dysfunction, bilateral 04/05/2019   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   White matter abnormality on MRI of brain 04/05/2019   Myalgia due to statin 12/14/2018   Normocytic anemia 12/14/2018   At moderate risk for fall 10/03/2018   Bilateral renal cysts 10/03/2018   Facet arthropathy, lumbosacral 10/03/2018   Lacunar infarction (HCC) 07/20/2018   Controlled type 2 diabetes  mellitus without complication, without long-term current use of insulin (HCC) 03/08/2018   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/23/2017   Overactive bladder 08/17/2017   Equinus contracture of ankle 07/27/2017   Hammer toes of both feet 07/27/2017   Dermatophytosis, nail 07/27/2017   Calcaneal spur of both feet 07/27/2017   Aortic atherosclerosis (HCC) 06/10/2017   Combined form of age-related cataract, both eyes 04/07/2017   Dermatochalasis of both eyelids 04/07/2017   Posterior vitreous detachment, right eye 04/07/2017   Postural kyphosis of cervicothoracic region 03/09/2017   Lumbar degenerative disc disease 03/09/2017   Bilateral primary osteoarthritis of knee 12/15/2016   Allergy to honey bee venom 12/15/2016   Hypertension 02/16/2016   DM type 2 with diabetic dyslipidemia (HCC) 02/10/2016   Gastroesophageal reflux disease without esophagitis 02/10/2016   Left ventricular diastolic dysfunction, NYHA class 1 01/28/2016   Hypothyroidism, 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   Urticaria 12/30/2015   Diabetic sensorimotor polyneuropathy (HCC) 10/27/2015   Low back strain 10/27/2015   Right inguinal pain 04/21/2015   Healthcare maintenance 12/30/2014   Skin rash 09/16/2014   Spider veins of both lower extremities 09/16/2014   Thyroid cancer (HCC) 08/17/2014   Papillary thyroid carcinoma (HCC) 08/09/2014   Post-menopausal atrophic vaginitis 06/24/2014   Nodule of left lung 01/21/2014   Class 1 obesity due to excess calories with serious comorbidity and body mass index (BMI) of 30.0  to 30.9 in adult 06/12/2012   Palpitations 11/29/2011   Cutaneous horn 09/29/2011   Abnormal mammogram of right breast 08/30/2011   Arthritis of left knee 05/24/2011   CAD (coronary artery disease) 05/24/2011   History of acute myocardial infarction of inferior wall 05/24/2011   Gastro-esophageal reflux disease with esophagitis 05/24/2011   Hyperlipidemia 05/24/2011    PCP: Christen Butter  REFERRING PROVIDER: Christen Butter  REFERRING DIAG: pain in bilateral lower extremities  THERAPY DIAG:  Chronic pain of both knees  Muscle weakness (generalized)  Rationale for Evaluation and Treatment: Rehabilitation  ONSET DATE: 03/2023  SUBJECTIVE:   SUBJECTIVE STATEMENT: My knees aren't bothering me that bad today. "I still have a little pain but not nearly as bad as it was". She plans to return [facility] next week  PERTINENT HISTORY: CAD, DM  Pt states that a month ago her legs swelled up and began burning. She states she has had difficulty with stairs and has even had to walk with a cane due to pain. She states she has pain in both knees, as well as burning feeling but only when she is at home. She states that she has had to be more cautious with walking due to increased fear of falling due to pain. PAIN:  Are you having pain? Yes: NPRS scale: 1/10 Pain location: bilateral knees Pain description: sore/achey Aggravating factors: stairs, walking Relieving factors: rest, voltaren  PRECAUTIONS: None  RED FLAGS: None   WEIGHT BEARING RESTRICTIONS: No  FALLS:  Has patient fallen in last 6 months? No  LIVING ENVIRONMENT: Lives with: lives alone Lives in: House/apartment Stairs: Yes: External: 5 steps; bilateral but cannot reach both Has following equipment at home: Single point cane  OCCUPATION: just retired from [employer], active with her church  PLOF: Independent  PATIENT GOALS: decrease pain  NEXT MD VISIT: PRN  OBJECTIVE:  Note: Objective measures were completed at Evaluation unless otherwise noted.  DIAGNOSTIC FINDINGS: Ultrasound: negative for DVT    COGNITION: Overall cognitive status: Within functional limits for tasks assessed     SENSATION: Pt c/o burning in bilateral LEs   MUSCLE LENGTH: Hamstrings: Right 95 deg; Left 95 deg   PALPATION: TTP bilat lateral, medial and posterior knees Patella mobility WFL  LOWER EXTREMITY  ROM:  AROM ROM Right eval Left eval 06/08/23  Hip flexion     Hip extension     Hip abduction     Hip adduction     Hip internal rotation     Hip external rotation     Knee flexion 109 110 Lt: 112; Rt:113   Knee extension 0 0   Ankle dorsiflexion     Ankle plantarflexion     Ankle inversion     Ankle eversion      (Blank rows = not tested)  LOWER EXTREMITY MMT:  MMT Right eval Left eval  Hip flexion 4- 4  Hip extension 3+ 3+  Hip abduction 3+ 3+  Hip adduction    Hip internal rotation    Hip external rotation    Knee flexion 4- 4-  Knee extension 4 4  Ankle dorsiflexion    Ankle plantarflexion    Ankle inversion    Ankle eversion     (Blank rows = not tested)  LOWER EXTREMITY SPECIAL TESTS:  Ober's negative bilat  FUNCTIONAL TESTS:  5 times sit to stand: 30.10 seconds  (eval), 16.14 seconds (11/22) SLS: unable due to pain  GAIT: Stairs: pt leads  with Rt LE due to Lt knee pain. Needs bilat handrails. Step to pattern   TODAY'S TREATMENT:      [facility] Adult PT Treatment:                                                DATE: 06/17/23 Therapeutic Exercise: Nustep L6 x 5 min Standing hip abd red TB x 20 bilat Standing hip ext red TB x 20 bilat Standing heel raise x 20 5 x STS LAQ 2# x 20 bilat HS curl green TB x 20 bilat Supine SLR with ER 2 x 5 on Lt, 2 x 10 on Rt Hooklying ball squeeze 3 sec hold x 10 Bridge with ball squeeze x 20 Sidelying hip abd 2 x 10   [facility] Adult PT Treatment:                                                DATE: 06/10/23 Therapeutic Exercise: Nustep L6 x 5 min LAQ 1.5# x 20 bilat HS curl green TB x 20 bilat Standing hip abd red TB x 15 bilat Standing hip ext red TB x 15 bilat SLR 2 x 10 bilat Bridge 2 x 10 Sidelying hip abd 2 x 10   [facility] Adult PT Treatment:                                                DATE: 06/08/23 Therapeutic Exercise: NuStep level 6 x 5 minutes  LAQ 2 x 10 @ 1.5 lbs  Resisted HS curl green band 2 x 10   Hip bridge with abduction green band 2 x 10  SLR 1.5# d/c due to pain Sidelying hip abduction 2 x 10  Prone quad stretch x 30 sec each     PATIENT EDUCATION:  Education details: HEP review  Person educated: Patient Education method: Explanation Education comprehension: verbalized understanding  HOME EXERCISE PROGRAM: Access Code: [email or web address] URL: [email or web address] Date: 05/18/2023 Prepared by: Reggy Eye  Exercises - Seated Calf Stretch with Strap  - 1 x daily - 7 x weekly - 1 sets - 3 reps - 20-30 seconds hold - Seated Hamstring Stretch  - 1 x daily - 7 x weekly - 1 sets - 3 reps - 20-30 seconds hold - Sit to Stand with Armchair  - 1 x daily - 7 x weekly - 2 sets - 10 reps  ASSESSMENT:  CLINICAL IMPRESSION: Pt is improving exercise tolerance. She had increased pain with SLR with ER on Lt, eased when out of position. Noted weakness in Lt hip adductors. Added exercises to progress adductor strength  OBJECTIVE IMPAIRMENTS: decreased activity tolerance, decreased balance, difficulty walking, decreased ROM, decreased strength, and pain.     GOALS: Goals reviewed with patient? Yes  SHORT TERM GOALS: Target date: 06/15/2023   Pt will be independent with initial HEP Baseline: Goal status: MET  2.  Pt will demo improved strength with 5 x STS <= 25 seconds Baseline:  Goal status: MET   LONG TERM GOALS: Target date: 07/13/2023    Pt will be independent in advanced HEP Baseline:  Goal status: INITIAL  2.  Pt will demo improved strength with 5 x STS <= 20 seconds Baseline:  Goal status:MET  3.  Pt will negotiate 1 flight of stairs without handrail with reciprocal pattern Baseline:  Goal status: INITIAL  4.  Pt will tolerate SLS x 10 seconds bilat LE to improve balance and reduce risk of falls Baseline:  Goal status: INITIAL    PLAN:  PT FREQUENCY: 1-2x/week  PT DURATION: 8 weeks  PLANNED INTERVENTIONS: 97164- PT Re-evaluation,  97110-Therapeutic exercises, 97530- Therapeutic activity, [id number]- Neuromuscular re-education, 97535- Self Care, 29562- Manual therapy, [id number]- Aquatic Therapy, 97014- Electrical stimulation (unattended), 97016- Vasopneumatic device, 97033- Ionotophoresis 4mg /ml Dexamethasone, Patient/Family education, Balance training, Taping, Dry Needling, Cryotherapy, and Moist heat  PLAN FOR NEXT SESSION: update HEP, knee and hip strength, increase resistance as tolerated   Reggy Eye, PT,DPT11/22/2410:06 AM

## 2023-06-24 ENCOUNTER — Other Ambulatory Visit: Payer: Self-pay | Admitting: Cardiology

## 2023-06-29 ENCOUNTER — Ambulatory Visit: Payer: 59 | Attending: Medical-Surgical

## 2023-06-29 DIAGNOSIS — M25562 Pain in left knee: Secondary | ICD-10-CM | POA: Diagnosis not present

## 2023-06-29 DIAGNOSIS — R262 Difficulty in walking, not elsewhere classified: Secondary | ICD-10-CM | POA: Insufficient documentation

## 2023-06-29 DIAGNOSIS — R293 Abnormal posture: Secondary | ICD-10-CM | POA: Diagnosis not present

## 2023-06-29 DIAGNOSIS — G8929 Other chronic pain: Secondary | ICD-10-CM | POA: Diagnosis not present

## 2023-06-29 DIAGNOSIS — R201 Hypoesthesia of skin: Secondary | ICD-10-CM | POA: Insufficient documentation

## 2023-06-29 DIAGNOSIS — M25561 Pain in right knee: Secondary | ICD-10-CM | POA: Diagnosis not present

## 2023-06-29 DIAGNOSIS — E890 Postprocedural hypothyroidism: Secondary | ICD-10-CM | POA: Diagnosis not present

## 2023-06-29 DIAGNOSIS — M6281 Muscle weakness (generalized): Secondary | ICD-10-CM | POA: Insufficient documentation

## 2023-06-29 NOTE — Therapy (Signed)
OUTPATIENT PHYSICAL THERAPY TREATMENT   Patient Name: Sara Gardner MRN: [id number] DOB:Nov 14, 1950, 72 y.o., female Today's Date: 06/29/2023  END OF SESSION:  PT End of Session - 06/29/23 0849     Visit Number 7    Number of Visits 16    Date for PT Re-Evaluation 06/28/23    Authorization Type [employer] Medicare    Authorization - Visit Number 7    Progress Note Due on Visit 10    PT Start Time 0848    PT Stop Time 0928    PT Time Calculation (min) 40 min    Activity Tolerance Patient tolerated treatment well    Behavior During Therapy [facility] for tasks assessed/performed                  Past Medical History:  Diagnosis Date   Aortic atherosclerosis (HCC) 06/10/2017   CAD (coronary artery disease)    Cancer (HCC)    Combined form of age-related cataract, both eyes 04/07/2017   Dermatochalasis of both eyelids 04/07/2017   Diabetes mellitus without complication (HCC)    GERD (gastroesophageal reflux disease)    Hypertension    Hypothyroidism    Lacunar infarction (HCC) 07/20/2018   Left renal mass 10/04/2018   1.7 cm hypoechoic mass 10/04/2018   Lumbar degenerative disc disease 03/09/2017   Myocardial infarction (HCC) 2005   Normocytic anemia 12/14/2018   Palpitations    Papillary thyroid carcinoma (HCC)    Posterior vitreous detachment, right eye 04/07/2017   Past Surgical History:  Procedure Laterality Date   TOTAL THYROIDECTOMY  2016   Patient Active Problem List   Diagnosis Date Noted   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/05/2019   Cervicalgia 04/05/2019   Eustachian tube dysfunction, bilateral 04/05/2019   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   White matter abnormality on MRI of brain 04/05/2019   Myalgia due to statin 12/14/2018   Normocytic anemia 12/14/2018   At moderate risk for fall 10/03/2018   Bilateral renal cysts 10/03/2018   Facet arthropathy, lumbosacral 10/03/2018   Lacunar infarction (HCC) 07/20/2018   Controlled type 2 diabetes  mellitus without complication, without long-term current use of insulin (HCC) 03/08/2018   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/23/2017   Overactive bladder 08/17/2017   Equinus contracture of ankle 07/27/2017   Hammer toes of both feet 07/27/2017   Dermatophytosis, nail 07/27/2017   Calcaneal spur of both feet 07/27/2017   Aortic atherosclerosis (HCC) 06/10/2017   Combined form of age-related cataract, both eyes 04/07/2017   Dermatochalasis of both eyelids 04/07/2017   Posterior vitreous detachment, right eye 04/07/2017   Postural kyphosis of cervicothoracic region 03/09/2017   Lumbar degenerative disc disease 03/09/2017   Bilateral primary osteoarthritis of knee 12/15/2016   Allergy to honey bee venom 12/15/2016   Hypertension 02/16/2016   DM type 2 with diabetic dyslipidemia (HCC) 02/10/2016   Gastroesophageal reflux disease without esophagitis 02/10/2016   Left ventricular diastolic dysfunction, NYHA class 1 01/28/2016   Hypothyroidism, 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   Urticaria 12/30/2015   Diabetic sensorimotor polyneuropathy (HCC) 10/27/2015   Low back strain 10/27/2015   Right inguinal pain 04/21/2015   Healthcare maintenance 12/30/2014   Skin rash 09/16/2014   Spider veins of both lower extremities 09/16/2014   Thyroid cancer (HCC) 08/17/2014   Papillary thyroid carcinoma (HCC) 08/09/2014   Post-menopausal atrophic vaginitis 06/24/2014   Nodule of left lung 01/21/2014   Class 1 obesity due to excess calories with serious comorbidity and body mass index (BMI) of 30.0  to 30.9 in adult 06/12/2012   Palpitations 11/29/2011   Cutaneous horn 09/29/2011   Abnormal mammogram of right breast 08/30/2011   Arthritis of left knee 05/24/2011   CAD (coronary artery disease) 05/24/2011   History of acute myocardial infarction of inferior wall 05/24/2011   Gastro-esophageal reflux disease with esophagitis 05/24/2011   Hyperlipidemia 05/24/2011    PCP: Christen Butter  REFERRING PROVIDER: Christen Butter  REFERRING DIAG: pain in bilateral lower extremities  THERAPY DIAG:  Chronic pain of both knees  Muscle weakness (generalized)  Difficulty in walking, not elsewhere classified  Chronic pain of left knee  Abnormal posture  Impaired sensation  Rationale for Evaluation and Treatment: Rehabilitation  ONSET DATE: 03/2023  SUBJECTIVE:   SUBJECTIVE STATEMENT: Patient reports her L knee is hurting today, states 8/10 pain in L and 5/10 pain in R knee and continues to have a burning pain. Patient states she has not been back to [facility] yet.   PERTINENT HISTORY: CAD, DM  Pt states that a month ago her legs swelled up and began burning. She states she has had difficulty with stairs and has even had to walk with a cane due to pain. She states she has pain in both knees, as well as burning feeling but only when she is at home. She states that she has had to be more cautious with walking due to increased fear of falling due to pain. PAIN:  Are you having pain? Yes: NPRS scale: 8/10 Pain location: bilateral knees Pain description: sore/achey Aggravating factors: stairs, walking Relieving factors: rest, voltaren  PRECAUTIONS: None  RED FLAGS: None   WEIGHT BEARING RESTRICTIONS: No  FALLS:  Has patient fallen in last 6 months? No  LIVING ENVIRONMENT: Lives with: lives alone Lives in: House/apartment Stairs: Yes: External: 5 steps; bilateral but cannot reach both Has following equipment at home: Single point cane  OCCUPATION: just retired from [employer], active with her church  PLOF: Independent  PATIENT GOALS: decrease pain  NEXT MD VISIT: PRN  OBJECTIVE:  Note: Objective measures were completed at Evaluation unless otherwise noted.  DIAGNOSTIC FINDINGS: Ultrasound: negative for DVT  COGNITION: Overall cognitive status: Within functional limits for tasks assessed     SENSATION: Pt c/o burning in bilateral LEs   MUSCLE  LENGTH: Hamstrings: Right 95 deg; Left 95 deg   PALPATION: TTP bilat lateral, medial and posterior knees Patella mobility WFL  LOWER EXTREMITY ROM:  AROM ROM Right eval Left eval 06/08/23  Hip flexion     Hip extension     Hip abduction     Hip adduction     Hip internal rotation     Hip external rotation     Knee flexion 109 110 Lt: 112; Rt:113   Knee extension 0 0   Ankle dorsiflexion     Ankle plantarflexion     Ankle inversion     Ankle eversion      (Blank rows = not tested)  LOWER EXTREMITY MMT:  MMT Right eval Left eval  Hip flexion 4- 4  Hip extension 3+ 3+  Hip abduction 3+ 3+  Hip adduction    Hip internal rotation    Hip external rotation    Knee flexion 4- 4-  Knee extension 4 4  Ankle dorsiflexion    Ankle plantarflexion    Ankle inversion    Ankle eversion     (Blank rows = not tested)  LOWER EXTREMITY SPECIAL TESTS:  Ober's negative bilat  FUNCTIONAL TESTS:  5 times sit to stand: 30.10 seconds  (eval), 16.14 seconds (11/22) SLS: unable due to pain  GAIT: Stairs: pt leads with Rt LE due to Lt knee pain. Needs bilat handrails. Step to pattern   TODAY'S TREATMENT:      [facility] Adult PT Treatment:                                                DATE: 06/29/2023 Therapeutic Exercise: NuStep L6 x 5,in SLR 10x5" (B) SAQ (green bolster) 10x5" (B) S/L straight leg hip abd 2x15 Supine travell piriformis stretch Seated LAQ --> R 4#AW 2x15, L 2#AW 2x15 Resisted side stepping + GTB above knees  Propped on elbows --> hi ext + GTB above knees x10 (B) Standing hip abd + GTB above knees 2x10    [facility] Adult PT Treatment:                                                DATE: 06/17/23 Therapeutic Exercise: Nustep L6 x 5 min Standing hip abd red TB x 20 bilat Standing hip ext red TB x 20 bilat Standing heel raise x 20 5 x STS LAQ 2# x 20 bilat HS curl green TB x 20 bilat Supine SLR with ER 2 x 5 on Lt, 2 x 10 on Rt Hooklying ball squeeze 3 sec hold  x 10 Bridge with ball squeeze x 20 Sidelying hip abd 2 x 10   [facility] Adult PT Treatment:                                                DATE: 06/10/23 Therapeutic Exercise: Nustep L6 x 5 min LAQ 1.5# x 20 bilat HS curl green TB x 20 bilat Standing hip abd red TB x 15 bilat Standing hip ext red TB x 15 bilat SLR 2 x 10 bilat Bridge 2 x 10 Sidelying hip abd 2 x 10   PATIENT EDUCATION:  Education details: Updated HEP Person educated: Patient Education method: Explanation Education comprehension: verbalized understanding  HOME EXERCISE PROGRAM: Access Code: [email or web address] URL: [email or web address] Date: 06/29/2023 Prepared by: Carlynn Herald  Exercises - Seated Calf Stretch with Strap  - 1 x daily - 7 x weekly - 1 sets - 3 reps - 20-30 seconds hold - Seated Hamstring Stretch  - 1 x daily - 7 x weekly - 1 sets - 3 reps - 20-30 seconds hold - Sit to Stand with Armchair  - 1 x daily - 7 x weekly - 2 sets - 10 reps - Supine Short Arc Quad  - 1 x daily - 7 x weekly - 3 sets - 10 reps - Seated Long Arc Quad  - 1 x daily - 7 x weekly - 3 sets - 10 reps - Heel Raises with Counter Support  - 1 x daily - 7 x weekly - 3 sets - 10 reps - Side Stepping with Resistance at Thighs and Counter Support  - 1 x daily - 7 x weekly - 3 sets - 10 reps  ASSESSMENT:  CLINICAL IMPRESSION: Session focused on quad and hip  strengthening to address subjective symptoms. HEP updated with quad and hip strengthening exercises.    OBJECTIVE IMPAIRMENTS: decreased activity tolerance, decreased balance, difficulty walking, decreased ROM, decreased strength, and pain.     GOALS: Goals reviewed with patient? Yes  SHORT TERM GOALS: Target date: 06/15/2023  Pt will be independent with initial HEP Baseline: Goal status: MET  2.  Pt will demo improved strength with 5 x STS <= 25 seconds Baseline:  Goal status: MET   LONG TERM GOALS: Target date: 07/13/2023  Pt will be independent in advanced  HEP Baseline:  Goal status: INITIAL  2.  Pt will demo improved strength with 5 x STS <= 20 seconds Baseline:  Goal status:MET  3.  Pt will negotiate 1 flight of stairs without handrail with reciprocal pattern Baseline:  Goal status: INITIAL  4.  Pt will tolerate SLS x 10 seconds bilat LE to improve balance and reduce risk of falls Baseline:  Goal status: INITIAL    PLAN:  PT FREQUENCY: 1-2x/week  PT DURATION: 8 weeks  PLANNED INTERVENTIONS: 97164- PT Re-evaluation, 97110-Therapeutic exercises, 97530- Therapeutic activity, [id number]- Neuromuscular re-education, 97535- Self Care, 16109- Manual therapy, [id number]- Aquatic Therapy, 97014- Electrical stimulation (unattended), 97016- Vasopneumatic device, [id number]- Ionotophoresis 4mg /ml Dexamethasone, Patient/Family education, Balance training, Taping, Dry Needling, Cryotherapy, and Moist heat  PLAN FOR NEXT SESSION: Progress knee and hip strength, increase resistance as tolerated   Carlynn Herald, PTA 06/29/23 9:28 AM

## 2023-06-30 ENCOUNTER — Encounter: Payer: Self-pay | Admitting: Medical-Surgical

## 2023-06-30 ENCOUNTER — Ambulatory Visit (INDEPENDENT_AMBULATORY_CARE_PROVIDER_SITE_OTHER): Payer: 59 | Admitting: Medical-Surgical

## 2023-06-30 VITALS — BP 99/63 | HR 93 | Resp 20 | Ht 63.0 in | Wt 170.3 lb

## 2023-06-30 DIAGNOSIS — Z7984 Long term (current) use of oral hypoglycemic drugs: Secondary | ICD-10-CM

## 2023-06-30 DIAGNOSIS — E119 Type 2 diabetes mellitus without complications: Secondary | ICD-10-CM

## 2023-06-30 DIAGNOSIS — Z0289 Encounter for other administrative examinations: Secondary | ICD-10-CM | POA: Diagnosis not present

## 2023-06-30 LAB — POCT GLYCOSYLATED HEMOGLOBIN (HGB A1C)
HbA1c, POC (controlled diabetic range): 6.3 % (ref 0.0–7.0)
Hemoglobin A1C: 6.3 % — AB (ref 4.0–5.6)

## 2023-06-30 NOTE — Progress Notes (Signed)
        Established patient visit  History, exam, impression, and plan:  1. Encounter for completion of form with patient Pleasant 72 year old female presenting today with request for completion of paperwork.  She has had a longstanding history with difficulty with her neighbor and is currently seeking a new apartment.  She has been given a form for provider completion regarding her disabilities, health status, and mental health concerns.  We reviewed the form together and completed it with appropriate information per her medical record and verbal history.  Her original form was returned to her and we have copies for placement in the chart.  2. Controlled type 2 diabetes mellitus without complication, without long-term current use of insulin (HCC) She has a history of type 2 diabetes that has been fairly well-controlled but was most recently uncontrolled with a hemoglobin A1c 7.5%.  Has been taking Farxiga 5 mg daily and metformin 1000 mg daily.  Recheck of hemoglobin A1c today at 6.3% indicating still well-controlled.  No changes in current medications.  Procedures performed this visit: None.  Return in about 6 months (around 12/29/2023) for DM follow up.  __________________________________ Thayer Ohm, DNP, APRN, FNP-BC Primary Care and Sports Medicine [facility]

## 2023-07-01 ENCOUNTER — Encounter: Payer: Self-pay | Admitting: Physical Therapy

## 2023-07-01 ENCOUNTER — Ambulatory Visit: Payer: 59 | Admitting: Physical Therapy

## 2023-07-01 DIAGNOSIS — M25561 Pain in right knee: Secondary | ICD-10-CM | POA: Diagnosis not present

## 2023-07-01 DIAGNOSIS — R262 Difficulty in walking, not elsewhere classified: Secondary | ICD-10-CM | POA: Diagnosis not present

## 2023-07-01 DIAGNOSIS — R201 Hypoesthesia of skin: Secondary | ICD-10-CM | POA: Diagnosis not present

## 2023-07-01 DIAGNOSIS — R293 Abnormal posture: Secondary | ICD-10-CM | POA: Diagnosis not present

## 2023-07-01 DIAGNOSIS — M6281 Muscle weakness (generalized): Secondary | ICD-10-CM

## 2023-07-01 DIAGNOSIS — G8929 Other chronic pain: Secondary | ICD-10-CM

## 2023-07-01 DIAGNOSIS — M25562 Pain in left knee: Secondary | ICD-10-CM | POA: Diagnosis not present

## 2023-07-01 NOTE — Therapy (Signed)
OUTPATIENT PHYSICAL THERAPY TREATMENT   Patient Name: Sara Gardner MRN: [id number] DOB:1951/01/10, 72 y.o., female Today's Date: 07/01/2023  END OF SESSION:  PT End of Session - 07/01/23 0918     Visit Number 8    Number of Visits 16    Date for PT Re-Evaluation 07/13/23    Authorization - Visit Number 8    Progress Note Due on Visit 10    PT Start Time 0843    PT Stop Time 0921    PT Time Calculation (min) 38 min    Activity Tolerance Patient tolerated treatment well    Behavior During Therapy [facility] for tasks assessed/performed                   Past Medical History:  Diagnosis Date   Aortic atherosclerosis (HCC) 06/10/2017   CAD (coronary artery disease)    Cancer (HCC)    Combined form of age-related cataract, both eyes 04/07/2017   Dermatochalasis of both eyelids 04/07/2017   Diabetes mellitus without complication (HCC)    GERD (gastroesophageal reflux disease)    Hypertension    Hypothyroidism    Lacunar infarction (HCC) 07/20/2018   Left renal mass 10/04/2018   1.7 cm hypoechoic mass 10/04/2018   Lumbar degenerative disc disease 03/09/2017   Myocardial infarction (HCC) 2005   Normocytic anemia 12/14/2018   Palpitations    Papillary thyroid carcinoma (HCC)    Posterior vitreous detachment, right eye 04/07/2017   Past Surgical History:  Procedure Laterality Date   TOTAL THYROIDECTOMY  2016   Patient Active Problem List   Diagnosis Date Noted   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/05/2019   Cervicalgia 04/05/2019   Eustachian tube dysfunction, bilateral 04/05/2019   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   White matter abnormality on MRI of brain 04/05/2019   Myalgia due to statin 12/14/2018   Normocytic anemia 12/14/2018   At moderate risk for fall 10/03/2018   Bilateral renal cysts 10/03/2018   Facet arthropathy, lumbosacral 10/03/2018   Lacunar infarction (HCC) 07/20/2018   Controlled type 2 diabetes mellitus without complication,  without long-term current use of insulin (HCC) 03/08/2018   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/23/2017   Overactive bladder 08/17/2017   Equinus contracture of ankle 07/27/2017   Hammer toes of both feet 07/27/2017   Dermatophytosis, nail 07/27/2017   Calcaneal spur of both feet 07/27/2017   Aortic atherosclerosis (HCC) 06/10/2017   Combined form of age-related cataract, both eyes 04/07/2017   Dermatochalasis of both eyelids 04/07/2017   Posterior vitreous detachment, right eye 04/07/2017   Postural kyphosis of cervicothoracic region 03/09/2017   Lumbar degenerative disc disease 03/09/2017   Bilateral primary osteoarthritis of knee 12/15/2016   Allergy to honey bee venom 12/15/2016   Hypertension 02/16/2016   DM type 2 with diabetic dyslipidemia (HCC) 02/10/2016   Gastroesophageal reflux disease without esophagitis 02/10/2016   Left ventricular diastolic dysfunction, NYHA class 1 01/28/2016   Hypothyroidism, 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   Urticaria 12/30/2015   Diabetic sensorimotor polyneuropathy (HCC) 10/27/2015   Low back strain 10/27/2015   Right inguinal pain 04/21/2015   Healthcare maintenance 12/30/2014   Skin rash 09/16/2014   Spider veins of both lower extremities 09/16/2014   Thyroid cancer (HCC) 08/17/2014   Papillary thyroid carcinoma (HCC) 08/09/2014   Post-menopausal atrophic vaginitis 06/24/2014   Nodule of left lung 01/21/2014   Class 1 obesity due to excess calories with serious comorbidity and body mass index (BMI) of 30.0 to 30.9 in adult 06/12/2012  Palpitations 11/29/2011   Cutaneous horn 09/29/2011   Abnormal mammogram of right breast 08/30/2011   Arthritis of left knee 05/24/2011   CAD (coronary artery disease) 05/24/2011   History of acute myocardial infarction of inferior wall 05/24/2011   Gastro-esophageal reflux disease with esophagitis 05/24/2011   Hyperlipidemia 05/24/2011    PCP: Christen Butter  REFERRING PROVIDER: Christen Butter  REFERRING DIAG: pain in bilateral lower extremities  THERAPY DIAG:  Chronic pain of both knees  Muscle weakness (generalized)  Rationale for Evaluation and Treatment: Rehabilitation  ONSET DATE: 03/2023  SUBJECTIVE:   SUBJECTIVE STATEMENT: Patient reports he knees are feeling better than last visit. She has not returned to the [facility]  PERTINENT HISTORY: CAD, DM  Pt states that a month ago her legs swelled up and began burning. She states she has had difficulty with stairs and has even had to walk with a cane due to pain. She states she has pain in both knees, as well as burning feeling but only when she is at home. She states that she has had to be more cautious with walking due to increased fear of falling due to pain. PAIN:  Are you having pain? Yes: NPRS scale: 6/10 Pain location: bilateral knees Pain description: sore/achey Aggravating factors: stairs, walking Relieving factors: rest, voltaren  PRECAUTIONS: None  RED FLAGS: None   WEIGHT BEARING RESTRICTIONS: No  FALLS:  Has patient fallen in last 6 months? No  LIVING ENVIRONMENT: Lives with: lives alone Lives in: House/apartment Stairs: Yes: External: 5 steps; bilateral but cannot reach both Has following equipment at home: Single point cane  OCCUPATION: just retired from [employer], active with her church  PLOF: Independent  PATIENT GOALS: decrease pain  NEXT MD VISIT: PRN  OBJECTIVE:  Note: Objective measures were completed at Evaluation unless otherwise noted.  DIAGNOSTIC FINDINGS: Ultrasound: negative for DVT  COGNITION: Overall cognitive status: Within functional limits for tasks assessed     SENSATION: Pt c/o burning in bilateral LEs   MUSCLE LENGTH: Hamstrings: Right 95 deg; Left 95 deg   PALPATION: TTP bilat lateral, medial and posterior knees Patella mobility WFL  LOWER EXTREMITY ROM:  AROM ROM Right eval Left eval 06/08/23  Hip flexion     Hip extension     Hip  abduction     Hip adduction     Hip internal rotation     Hip external rotation     Knee flexion 109 110 Lt: 112; Rt:113   Knee extension 0 0   Ankle dorsiflexion     Ankle plantarflexion     Ankle inversion     Ankle eversion      (Blank rows = not tested)  LOWER EXTREMITY MMT:  MMT Right eval Left eval  Hip flexion 4- 4  Hip extension 3+ 3+  Hip abduction 3+ 3+  Hip adduction    Hip internal rotation    Hip external rotation    Knee flexion 4- 4-  Knee extension 4 4  Ankle dorsiflexion    Ankle plantarflexion    Ankle inversion    Ankle eversion     (Blank rows = not tested)  LOWER EXTREMITY SPECIAL TESTS:  Ober's negative bilat  FUNCTIONAL TESTS:  5 times sit to stand: 30.10 seconds  (eval), 16.14 seconds (11/22) SLS: unable due to pain  GAIT: Stairs: pt leads with Rt LE due to Lt knee pain. Needs bilat handrails. Step to pattern   TODAY'S TREATMENT:  [facility] Adult PT Treatment:                                                DATE: 07/01/23 Therapeutic Exercise: Nustep L6 x 5 min Hip abd green TB around knees 2 x 10 Hip ext green TB 2 x 10 LAQ 4# Rt, 2# Lt 2 x 15 SLR 10 x 5 sec hold bilat Bridge 2 x 10 Sidelying hip abd 2 x 10 bilat Rt SLS 10 seconds, Lt SLS 6 seconds   [facility] Adult PT Treatment:                                                DATE: 06/29/2023 Therapeutic Exercise: NuStep L6 x 5,in SLR 10x5" (B) SAQ (green bolster) 10x5" (B) S/L straight leg hip abd 2x15 Supine travell piriformis stretch Seated LAQ --> R 4#AW 2x15, L 2#AW 2x15 Resisted side stepping + GTB above knees  Propped on elbows --> hi ext + GTB above knees x10 (B) Standing hip abd + GTB above knees 2x10    [facility] Adult PT Treatment:                                                DATE: 06/17/23 Therapeutic Exercise: Nustep L6 x 5 min Standing hip abd red TB x 20 bilat Standing hip ext red TB x 20 bilat Standing heel raise x 20 5 x STS LAQ 2# x 20 bilat HS curl green TB x  20 bilat Supine SLR with ER 2 x 5 on Lt, 2 x 10 on Rt Hooklying ball squeeze 3 sec hold x 10 Bridge with ball squeeze x 20 Sidelying hip abd 2 x 10   PATIENT EDUCATION:  Education details: Updated HEP Person educated: Patient Education method: Explanation Education comprehension: verbalized understanding  HOME EXERCISE PROGRAM: Access Code: [email or web address] URL: [email or web address] Date: 06/29/2023 Prepared by: Carlynn Herald  Exercises - Seated Calf Stretch with Strap  - 1 x daily - 7 x weekly - 1 sets - 3 reps - 20-30 seconds hold - Seated Hamstring Stretch  - 1 x daily - 7 x weekly - 1 sets - 3 reps - 20-30 seconds hold - Sit to Stand with Armchair  - 1 x daily - 7 x weekly - 2 sets - 10 reps - Supine Short Arc Quad  - 1 x daily - 7 x weekly - 3 sets - 10 reps - Seated Long Arc Quad  - 1 x daily - 7 x weekly - 3 sets - 10 reps - Heel Raises with Counter Support  - 1 x daily - 7 x weekly - 3 sets - 10 reps - Side Stepping with Resistance at Thighs and Counter Support  - 1 x daily - 7 x weekly - 3 sets - 10 reps  ASSESSMENT:  CLINICAL IMPRESSION: Pt continues with knee pain but she has improved ability to perform SLS on bilat LEs. She states she still does step to pattern for stairs. She is progressing well towards goals   OBJECTIVE IMPAIRMENTS: decreased activity tolerance, decreased balance, difficulty walking, decreased  ROM, decreased strength, and pain.     GOALS: Goals reviewed with patient? Yes  SHORT TERM GOALS: Target date: 06/15/2023  Pt will be independent with initial HEP Baseline: Goal status: MET  2.  Pt will demo improved strength with 5 x STS <= 25 seconds Baseline:  Goal status: MET   LONG TERM GOALS: Target date: 07/13/2023  Pt will be independent in advanced HEP Baseline:  Goal status: INITIAL  2.  Pt will demo improved strength with 5 x STS <= 20 seconds Baseline:  Goal status:MET  3.  Pt will negotiate 1 flight of stairs  without handrail with reciprocal pattern Baseline:  Goal status: IN PROGRESS  4.  Pt will tolerate SLS x 10 seconds bilat LE to improve balance and reduce risk of falls Baseline:  Goal status: IN PROGRESS    PLAN:  PT FREQUENCY: 1-2x/week  PT DURATION: 8 weeks  PLANNED INTERVENTIONS: 97164- PT Re-evaluation, 97110-Therapeutic exercises, 97530- Therapeutic activity, [id number]- Neuromuscular re-education, 97535- Self Care, 16109- Manual therapy, [id number]- Aquatic Therapy, 97014- Electrical stimulation (unattended), 97016- Vasopneumatic device, 97033- Ionotophoresis 4mg /ml Dexamethasone, Patient/Family education, Balance training, Taping, Dry Needling, Cryotherapy, and Moist heat  PLAN FOR NEXT SESSION: Progress knee and hip strength, increase resistance as tolerated   Reggy Eye, PT,DPT12/06/249:19 AM

## 2023-07-05 ENCOUNTER — Telehealth: Payer: Self-pay | Admitting: Medical-Surgical

## 2023-07-05 NOTE — Telephone Encounter (Signed)
Patient dropped off document  from Podiatrist , to be filled out by provider. Patient requested to send it back via Call Patient to pick up within ASAP. Document is located in providers tray at front office.Please advise at [facility] [phone]

## 2023-07-06 ENCOUNTER — Ambulatory Visit: Payer: 59 | Admitting: Physical Therapy

## 2023-07-06 ENCOUNTER — Encounter: Payer: Self-pay | Admitting: Physical Therapy

## 2023-07-06 DIAGNOSIS — G8929 Other chronic pain: Secondary | ICD-10-CM

## 2023-07-06 DIAGNOSIS — M6281 Muscle weakness (generalized): Secondary | ICD-10-CM | POA: Diagnosis not present

## 2023-07-06 DIAGNOSIS — M25561 Pain in right knee: Secondary | ICD-10-CM | POA: Diagnosis not present

## 2023-07-06 DIAGNOSIS — R262 Difficulty in walking, not elsewhere classified: Secondary | ICD-10-CM | POA: Diagnosis not present

## 2023-07-06 DIAGNOSIS — R293 Abnormal posture: Secondary | ICD-10-CM | POA: Diagnosis not present

## 2023-07-06 DIAGNOSIS — R201 Hypoesthesia of skin: Secondary | ICD-10-CM | POA: Diagnosis not present

## 2023-07-06 DIAGNOSIS — M25562 Pain in left knee: Secondary | ICD-10-CM | POA: Diagnosis not present

## 2023-07-06 NOTE — Therapy (Signed)
OUTPATIENT PHYSICAL THERAPY TREATMENT   Patient Name: Sara Gardner MRN: [id number] DOB:10-Aug-1950, 72 y.o., female Today's Date: 07/06/2023  END OF SESSION:  PT End of Session - 07/06/23 1007     Visit Number 9    Number of Visits 16    Date for PT Re-Evaluation 07/13/23    Authorization Type [employer]    Authorization - Visit Number 9    Progress Note Due on Visit 10    PT Start Time [phone]    PT Stop Time 1015    PT Time Calculation (min) 38 min    Activity Tolerance Patient tolerated treatment well    Behavior During Therapy [facility] for tasks assessed/performed                    Past Medical History:  Diagnosis Date   Aortic atherosclerosis (HCC) 06/10/2017   CAD (coronary artery disease)    Cancer (HCC)    Combined form of age-related cataract, both eyes 04/07/2017   Dermatochalasis of both eyelids 04/07/2017   Diabetes mellitus without complication (HCC)    GERD (gastroesophageal reflux disease)    Hypertension    Hypothyroidism    Lacunar infarction (HCC) 07/20/2018   Left renal mass 10/04/2018   1.7 cm hypoechoic mass 10/04/2018   Lumbar degenerative disc disease 03/09/2017   Myocardial infarction (HCC) 2005   Normocytic anemia 12/14/2018   Palpitations    Papillary thyroid carcinoma (HCC)    Posterior vitreous detachment, right eye 04/07/2017   Past Surgical History:  Procedure Laterality Date   TOTAL THYROIDECTOMY  2016   Patient Active Problem List   Diagnosis Date Noted   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/05/2019   Cervicalgia 04/05/2019   Eustachian tube dysfunction, bilateral 04/05/2019   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   White matter abnormality on MRI of brain 04/05/2019   Myalgia due to statin 12/14/2018   Normocytic anemia 12/14/2018   At moderate risk for fall 10/03/2018   Bilateral renal cysts 10/03/2018   Facet arthropathy, lumbosacral 10/03/2018   Lacunar infarction (HCC) 07/20/2018   Controlled type 2  diabetes mellitus without complication, without long-term current use of insulin (HCC) 03/08/2018   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/23/2017   Overactive bladder 08/17/2017   Equinus contracture of ankle 07/27/2017   Hammer toes of both feet 07/27/2017   Dermatophytosis, nail 07/27/2017   Calcaneal spur of both feet 07/27/2017   Aortic atherosclerosis (HCC) 06/10/2017   Combined form of age-related cataract, both eyes 04/07/2017   Dermatochalasis of both eyelids 04/07/2017   Posterior vitreous detachment, right eye 04/07/2017   Postural kyphosis of cervicothoracic region 03/09/2017   Lumbar degenerative disc disease 03/09/2017   Bilateral primary osteoarthritis of knee 12/15/2016   Allergy to honey bee venom 12/15/2016   Hypertension 02/16/2016   DM type 2 with diabetic dyslipidemia (HCC) 02/10/2016   Gastroesophageal reflux disease without esophagitis 02/10/2016   Left ventricular diastolic dysfunction, NYHA class 1 01/28/2016   Hypothyroidism, 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   Urticaria 12/30/2015   Diabetic sensorimotor polyneuropathy (HCC) 10/27/2015   Low back strain 10/27/2015   Right inguinal pain 04/21/2015   Healthcare maintenance 12/30/2014   Skin rash 09/16/2014   Spider veins of both lower extremities 09/16/2014   Thyroid cancer (HCC) 08/17/2014   Papillary thyroid carcinoma (HCC) 08/09/2014   Post-menopausal atrophic vaginitis 06/24/2014   Nodule of left lung 01/21/2014   Class 1 obesity due to excess calories with serious comorbidity and body mass index (BMI)  of 30.0 to 30.9 in adult 06/12/2012   Palpitations 11/29/2011   Cutaneous horn 09/29/2011   Abnormal mammogram of right breast 08/30/2011   Arthritis of left knee 05/24/2011   CAD (coronary artery disease) 05/24/2011   History of acute myocardial infarction of inferior wall 05/24/2011   Gastro-esophageal reflux disease with esophagitis 05/24/2011   Hyperlipidemia 05/24/2011    PCP: Christen Butter  REFERRING PROVIDER: Christen Butter  REFERRING DIAG: pain in bilateral lower extremities  THERAPY DIAG:  Chronic pain of both knees  Muscle weakness (generalized)  Rationale for Evaluation and Treatment: Rehabilitation  ONSET DATE: 03/2023  SUBJECTIVE:   SUBJECTIVE STATEMENT: Patient reports she is feeling less achy  PERTINENT HISTORY: CAD, DM  Pt states that a month ago her legs swelled up and began burning. She states she has had difficulty with stairs and has even had to walk with a cane due to pain. She states she has pain in both knees, as well as burning feeling but only when she is at home. She states that she has had to be more cautious with walking due to increased fear of falling due to pain. PAIN:  Are you having pain? Yes: NPRS scale: 4/10 Pain location: bilateral knees Pain description: sore/achey Aggravating factors: stairs, walking Relieving factors: rest, voltaren  PRECAUTIONS: None  RED FLAGS: None   WEIGHT BEARING RESTRICTIONS: No  FALLS:  Has patient fallen in last 6 months? No  LIVING ENVIRONMENT: Lives with: lives alone Lives in: House/apartment Stairs: Yes: External: 5 steps; bilateral but cannot reach both Has following equipment at home: Single point cane  OCCUPATION: just retired from [employer], active with her church  PLOF: Independent  PATIENT GOALS: decrease pain  NEXT MD VISIT: PRN  OBJECTIVE:  Note: Objective measures were completed at Evaluation unless otherwise noted.  DIAGNOSTIC FINDINGS: Ultrasound: negative for DVT  COGNITION: Overall cognitive status: Within functional limits for tasks assessed     SENSATION: Pt c/o burning in bilateral LEs   MUSCLE LENGTH: Hamstrings: Right 95 deg; Left 95 deg   PALPATION: TTP bilat lateral, medial and posterior knees Patella mobility WFL  LOWER EXTREMITY ROM:  AROM ROM Right eval Left eval 06/08/23  Hip flexion     Hip extension     Hip abduction     Hip  adduction     Hip internal rotation     Hip external rotation     Knee flexion 109 110 Lt: 112; Rt:113   Knee extension 0 0   Ankle dorsiflexion     Ankle plantarflexion     Ankle inversion     Ankle eversion      (Blank rows = not tested)  LOWER EXTREMITY MMT:  MMT Right eval Left eval  Hip flexion 4- 4  Hip extension 3+ 3+  Hip abduction 3+ 3+  Hip adduction    Hip internal rotation    Hip external rotation    Knee flexion 4- 4-  Knee extension 4 4  Ankle dorsiflexion    Ankle plantarflexion    Ankle inversion    Ankle eversion     (Blank rows = not tested)  LOWER EXTREMITY SPECIAL TESTS:  Ober's negative bilat  FUNCTIONAL TESTS:  5 times sit to stand: 30.10 seconds  (eval), 16.14 seconds (11/22) SLS: unable due to pain  GAIT: Stairs: pt leads with Rt LE due to Lt knee pain. Needs bilat handrails. Step to pattern   TODAY'S TREATMENT:      [facility]  Adult PT Treatment:                                                DATE: 07/06/23 Therapeutic Exercise: Nustep L6 x 5 min Hip abd green TB around knees 2 x 10 Hip ext green TB 2 x 10 Lateral step up Rt LE 4'' step x 12 Lateral step up Lt LE 2'' step x 20 LAQ 5# Rt, 2.5# Lt 2 x 15 Seated march x 20 4# bilat SLR x 20 bilat Bridge x 20 Sidelying hip abd 2 x 10 bilat   [facility] Adult PT Treatment:                                                DATE: 07/01/23 Therapeutic Exercise: Nustep L6 x 5 min Hip abd green TB around knees 2 x 10 Hip ext green TB 2 x 10 LAQ 4# Rt, 2# Lt 2 x 15 SLR 10 x 5 sec hold bilat Bridge 2 x 10 Sidelying hip abd 2 x 10 bilat Rt SLS 10 seconds, Lt SLS 6 seconds   [facility] Adult PT Treatment:                                                DATE: 06/29/2023 Therapeutic Exercise: NuStep L6 x 5,in SLR 10x5" (B) SAQ (green bolster) 10x5" (B) S/L straight leg hip abd 2x15 Supine travell piriformis stretch Seated LAQ --> R 4#AW 2x15, L 2#AW 2x15 Resisted side stepping + GTB above knees  Propped  on elbows --> hi ext + GTB above knees x10 (B) Standing hip abd + GTB above knees 2x10    PATIENT EDUCATION:  Education details: Updated HEP Person educated: Patient Education method: Explanation Education comprehension: verbalized understanding  HOME EXERCISE PROGRAM: Access Code: [email or web address] URL: [email or web address] Date: 06/29/2023 Prepared by: Carlynn Herald  Exercises - Seated Calf Stretch with Strap  - 1 x daily - 7 x weekly - 1 sets - 3 reps - 20-30 seconds hold - Seated Hamstring Stretch  - 1 x daily - 7 x weekly - 1 sets - 3 reps - 20-30 seconds hold - Sit to Stand with Armchair  - 1 x daily - 7 x weekly - 2 sets - 10 reps - Supine Short Arc Quad  - 1 x daily - 7 x weekly - 3 sets - 10 reps - Seated Long Arc Quad  - 1 x daily - 7 x weekly - 3 sets - 10 reps - Heel Raises with Counter Support  - 1 x daily - 7 x weekly - 3 sets - 10 reps - Side Stepping with Resistance at Thighs and Counter Support  - 1 x daily - 7 x weekly - 3 sets - 10 reps  ASSESSMENT:  CLINICAL IMPRESSION: Pt continues to progress well. She was able to tolerate 2'' step with LT LE today, 4'' step with Rt with 1 UE support. She is progressing well towards goals   OBJECTIVE IMPAIRMENTS: decreased activity tolerance, decreased balance, difficulty walking, decreased ROM, decreased strength, and pain.     GOALS: Goals  reviewed with patient? Yes  SHORT TERM GOALS: Target date: 06/15/2023  Pt will be independent with initial HEP Baseline: Goal status: MET  2.  Pt will demo improved strength with 5 x STS <= 25 seconds Baseline:  Goal status: MET   LONG TERM GOALS: Target date: 07/13/2023  Pt will be independent in advanced HEP Baseline:  Goal status: INITIAL  2.  Pt will demo improved strength with 5 x STS <= 20 seconds Baseline:  Goal status:MET  3.  Pt will negotiate 1 flight of stairs without handrail with reciprocal pattern Baseline:  Goal status: IN PROGRESS  4.  Pt  will tolerate SLS x 10 seconds bilat LE to improve balance and reduce risk of falls Baseline:  Goal status: IN PROGRESS    PLAN:  PT FREQUENCY: 1-2x/week  PT DURATION: 8 weeks  PLANNED INTERVENTIONS: 97164- PT Re-evaluation, 97110-Therapeutic exercises, 97530- Therapeutic activity, [id number]- Neuromuscular re-education, 97535- Self Care, 16109- Manual therapy, [id number]- Aquatic Therapy, 97014- Electrical stimulation (unattended), 97016- Vasopneumatic device, [id number]- Ionotophoresis 4mg /ml Dexamethasone, Patient/Family education, Balance training, Taping, Dry Needling, Cryotherapy, and Moist heat  PLAN FOR NEXT SESSION: work on stairs/step ups,Progress knee and hip strength, increase resistance as tolerated   Reggy Eye, PT,DPT12/05/2409:12 AM

## 2023-07-08 ENCOUNTER — Other Ambulatory Visit: Payer: Self-pay | Admitting: Medical-Surgical

## 2023-07-08 DIAGNOSIS — Z1231 Encounter for screening mammogram for malignant neoplasm of breast: Secondary | ICD-10-CM

## 2023-07-12 ENCOUNTER — Telehealth: Payer: Self-pay | Admitting: Medical-Surgical

## 2023-07-12 NOTE — Telephone Encounter (Signed)
Patient will need to have an in person appointment with a MD or DO in our office to complete the paperwork for diabetic shoes. I cannot do this as a NP in the state of [place].   Copied from CRM [id number]. Topic: General - Phone/Fax/Address >> Jul 12, 2023  3:42 PM Brita Romp wrote: Patient called because she said [employer] said they still have not received paperwork they need from doctor so patient can get diabetes shoes

## 2023-07-12 NOTE — Telephone Encounter (Signed)
Message sent to provider 

## 2023-07-13 ENCOUNTER — Other Ambulatory Visit: Payer: Self-pay | Admitting: Medical-Surgical

## 2023-07-13 ENCOUNTER — Ambulatory Visit: Payer: 59 | Admitting: Physical Therapy

## 2023-07-13 NOTE — Telephone Encounter (Signed)
Copied from CRM [id number]. Topic: General - Phone/Fax/Address >> Jul 12, 2023  9:36 AM Maxwell Marion wrote: Patient called because she said [employer] said they still have not received paperwork they need from doctor so patient can get diabetes shoes >> Jul 12, 2023  3:42 PM Brita Romp wrote: Patient called because she said [employer] said they still have not received paperwork they need from doctor so patient can get diabetes shoes

## 2023-07-13 NOTE — Telephone Encounter (Signed)
Patient needs appointment with an MD or a DO in our office. I have the form at my desk in my hold folder.   Please contact the patient to schedule an appointment with either Dr. Ashley Royalty, Dr. Tamera Punt, Dr. Benjamin Stain, or Dr. Linford Arnold.

## 2023-07-14 ENCOUNTER — Other Ambulatory Visit: Payer: Self-pay | Admitting: Medical-Surgical

## 2023-07-14 ENCOUNTER — Encounter: Payer: Self-pay | Admitting: Rehabilitative and Restorative Service Providers"

## 2023-07-15 NOTE — Telephone Encounter (Signed)
I called patient to schedule her an appointment with Dr. Ashley Royalty he agreed to see her she stated that she didn't need to come in it the paperwork can be signed and faxed back she said forget the whole thing

## 2023-07-26 ENCOUNTER — Ambulatory Visit: Payer: 59 | Admitting: Physical Therapy

## 2023-08-03 ENCOUNTER — Encounter: Payer: Self-pay | Admitting: Physical Therapy

## 2023-08-03 ENCOUNTER — Ambulatory Visit: Payer: 59 | Attending: Medical-Surgical | Admitting: Physical Therapy

## 2023-08-03 DIAGNOSIS — M6281 Muscle weakness (generalized): Secondary | ICD-10-CM | POA: Insufficient documentation

## 2023-08-03 DIAGNOSIS — M25562 Pain in left knee: Secondary | ICD-10-CM | POA: Insufficient documentation

## 2023-08-03 DIAGNOSIS — R262 Difficulty in walking, not elsewhere classified: Secondary | ICD-10-CM | POA: Insufficient documentation

## 2023-08-03 DIAGNOSIS — M25561 Pain in right knee: Secondary | ICD-10-CM | POA: Insufficient documentation

## 2023-08-03 DIAGNOSIS — G8929 Other chronic pain: Secondary | ICD-10-CM | POA: Insufficient documentation

## 2023-08-03 NOTE — Therapy (Signed)
 OUTPATIENT PHYSICAL THERAPY TREATMENT AND RECERTIFICATION AND 10th VISIT NOTE   Patient Name: Asianae Minkler MRN: [id number] DOB:01/03/1951, 73 y.o., female Today's Date: 08/03/2023 Dates of service: 05/18/23-08/03/23 END OF SESSION:  PT End of Session - 08/03/23 1058     Visit Number 10    Number of Visits 16    Date for PT Re-Evaluation 09/14/23    Authorization Type [employer] Medicare    Authorization - Visit Number 10    Progress Note Due on Visit 20    PT Start Time 1016    PT Stop Time 1054    PT Time Calculation (min) 38 min    Activity Tolerance Patient tolerated treatment well    Behavior During Therapy WFL for tasks assessed/performed                     Past Medical History:  Diagnosis Date   Aortic atherosclerosis (HCC) 06/10/2017   CAD (coronary artery disease)    Cancer (HCC)    Combined form of age-related cataract, both eyes 04/07/2017   Dermatochalasis of both eyelids 04/07/2017   Diabetes mellitus without complication (HCC)    GERD (gastroesophageal reflux disease)    Hypertension    Hypothyroidism    Lacunar infarction (HCC) 07/20/2018   Left renal mass 10/04/2018   1.7 cm hypoechoic mass 10/04/2018   Lumbar degenerative disc disease 03/09/2017   Myocardial infarction (HCC) 2005   Normocytic anemia 12/14/2018   Palpitations    Papillary thyroid  carcinoma (HCC)    Posterior vitreous detachment, right eye 04/07/2017   Past Surgical History:  Procedure Laterality Date   TOTAL THYROIDECTOMY  2016   Patient Active Problem List   Diagnosis Date Noted   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/05/2019   Cervicalgia 04/05/2019   Eustachian tube dysfunction, bilateral 04/05/2019   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   White matter abnormality on MRI of brain 04/05/2019   Myalgia due to statin 12/14/2018   Normocytic anemia 12/14/2018   At moderate risk for fall 10/03/2018   Bilateral renal cysts 10/03/2018   Facet arthropathy, lumbosacral  10/03/2018   Lacunar infarction (HCC) 07/20/2018   Controlled type 2 diabetes mellitus without complication, without long-term current use of insulin  (HCC) 03/08/2018   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/23/2017   Overactive bladder 08/17/2017   Equinus contracture of ankle 07/27/2017   Hammer toes of both feet 07/27/2017   Dermatophytosis, nail 07/27/2017   Calcaneal spur of both feet 07/27/2017   Aortic atherosclerosis (HCC) 06/10/2017   Combined form of age-related cataract, both eyes 04/07/2017   Dermatochalasis of both eyelids 04/07/2017   Posterior vitreous detachment, right eye 04/07/2017   Postural kyphosis of cervicothoracic region 03/09/2017   Lumbar degenerative disc disease 03/09/2017   Bilateral primary osteoarthritis of knee 12/15/2016   Allergy to honey bee venom 12/15/2016   Hypertension 02/16/2016   DM type 2 with diabetic dyslipidemia (HCC) 02/10/2016   Gastroesophageal reflux disease without esophagitis 02/10/2016   Left ventricular diastolic dysfunction, NYHA class 1 01/28/2016   Hypothyroidism, 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   Urticaria 12/30/2015   Diabetic sensorimotor polyneuropathy (HCC) 10/27/2015   Low back strain 10/27/2015   Right inguinal pain 04/21/2015   Healthcare maintenance 12/30/2014   Skin rash 09/16/2014   Spider veins of both lower extremities 09/16/2014   Thyroid  cancer (HCC) 08/17/2014   Papillary thyroid  carcinoma (HCC) 08/09/2014   Post-menopausal atrophic vaginitis 06/24/2014   Nodule of left lung 01/21/2014   Class 1 obesity due to  excess calories with serious comorbidity and body mass index (BMI) of 30.0 to 30.9 in adult 06/12/2012   Palpitations 11/29/2011   Cutaneous horn 09/29/2011   Abnormal mammogram of right breast 08/30/2011   Arthritis of left knee 05/24/2011   CAD (coronary artery disease) 05/24/2011   History of acute myocardial infarction of inferior wall 05/24/2011   Gastro-esophageal reflux disease with  esophagitis 05/24/2011   Hyperlipidemia 05/24/2011    PCP: Willo Mini  REFERRING PROVIDER: Willo Mini  REFERRING DIAG: pain in bilateral lower extremities  THERAPY DIAG:  Chronic pain of both knees  Muscle weakness (generalized)  Difficulty in walking, not elsewhere classified  Rationale for Evaluation and Treatment: Rehabilitation  ONSET DATE: 03/2023  SUBJECTIVE:   SUBJECTIVE STATEMENT: Patient reports her knee pain is up and down depending on the weather  PERTINENT HISTORY: CAD, DM  Pt states that a month ago her legs swelled up and began burning. She states she has had difficulty with stairs and has even had to walk with a cane due to pain. She states she has pain in both knees, as well as burning feeling but only when she is at home. She states that she has had to be more cautious with walking due to increased fear of falling due to pain. PAIN:  Are you having pain? Yes: NPRS scale: 4/10 Pain location: bilateral knees Pain description: sore/achey Aggravating factors: stairs, walking Relieving factors: rest, voltaren   PRECAUTIONS: None  RED FLAGS: None   WEIGHT BEARING RESTRICTIONS: No  FALLS:  Has patient fallen in last 6 months? No  LIVING ENVIRONMENT: Lives with: lives alone Lives in: House/apartment Stairs: Yes: External: 5 steps; bilateral but cannot reach both Has following equipment at home: Single point cane  OCCUPATION: just retired from [employer], active with her church  PLOF: Independent  PATIENT GOALS: decrease pain  NEXT MD VISIT: PRN  OBJECTIVE:  Note: Objective measures were completed at Evaluation unless otherwise noted.  DIAGNOSTIC FINDINGS: Ultrasound: negative for DVT  COGNITION: Overall cognitive status: Within functional limits for tasks assessed     SENSATION: Pt c/o burning in bilateral LEs   MUSCLE LENGTH: Hamstrings: Right 95 deg; Left 95 deg   PALPATION: TTP bilat lateral, medial and posterior  knees Patella mobility WFL  LOWER EXTREMITY ROM:  AROM ROM Right eval Left eval 06/08/23  Hip flexion     Hip extension     Hip abduction     Hip adduction     Hip internal rotation     Hip external rotation     Knee flexion 109 110 Lt: 112; Rt:113   Knee extension 0 0   Ankle dorsiflexion     Ankle plantarflexion     Ankle inversion     Ankle eversion      (Blank rows = not tested)  LOWER EXTREMITY MMT:  MMT Right eval Left eval  Hip flexion 4- 4  Hip extension 3+ 3+  Hip abduction 3+ 3+  Hip adduction    Hip internal rotation    Hip external rotation    Knee flexion 4- 4-  Knee extension 4 4  Ankle dorsiflexion    Ankle plantarflexion    Ankle inversion    Ankle eversion     (Blank rows = not tested)  LOWER EXTREMITY SPECIAL TESTS:  Ober's negative bilat  FUNCTIONAL TESTS:  5 times sit to stand: 30.10 seconds  (eval), 16.14 seconds (11/22) SLS: unable due to pain  GAIT: Stairs: pt leads with  Rt LE due to Lt knee pain. Needs bilat handrails. Step to pattern   TODAY'S TREATMENT:      [facility] Adult PT Treatment:                                                DATE: 08/03/23 Therapeutic Exercise: Nustep L6 x 5 min Stair negotiation with bilat handrails - pt requires step to pattern when ascending stairs with Lt LE Step up 2'' step forward and laterally Lt LE 2 x 10 SLS Rt LE: 20 seconds, Lt LE: 3 seconds Semi tandem stance on foam 2 x 30 seconds LAQ 4# Rt, 2# Lt 2 x 15 SLR Rt LE 4# x 10, Lt LE x 10 Bridge x 20 Sidelying hip abd 2 x 10 bilat   [facility] Adult PT Treatment:                                                DATE: 07/06/23 Therapeutic Exercise: Nustep L6 x 5 min Hip abd green TB around knees 2 x 10 Hip ext green TB 2 x 10 Lateral step up Rt LE 4'' step x 12 Lateral step up Lt LE 2'' step x 20 LAQ 5# Rt, 2.5# Lt 2 x 15 Seated march x 20 4# bilat SLR x 20 bilat Bridge x 20 Sidelying hip abd 2 x 10 bilat   [facility] Adult PT Treatment:                                                 DATE: 07/01/23 Therapeutic Exercise: Nustep L6 x 5 min Hip abd green TB around knees 2 x 10 Hip ext green TB 2 x 10 LAQ 4# Rt, 2# Lt 2 x 15 SLR 10 x 5 sec hold bilat Bridge 2 x 10 Sidelying hip abd 2 x 10 bilat Rt SLS 10 seconds, Lt SLS 6 seconds   PATIENT EDUCATION:  Education details: Updated HEP Person educated: Patient Education method: Explanation Education comprehension: verbalized understanding  HOME EXERCISE PROGRAM: Access Code: [email or web address] URL: [email or web address] Date: 06/29/2023 Prepared by: Lamarr Price  Exercises - Seated Calf Stretch with Strap  - 1 x daily - 7 x weekly - 1 sets - 3 reps - 20-30 seconds hold - Seated Hamstring Stretch  - 1 x daily - 7 x weekly - 1 sets - 3 reps - 20-30 seconds hold - Sit to Stand with Armchair  - 1 x daily - 7 x weekly - 2 sets - 10 reps - Supine Short Arc Quad  - 1 x daily - 7 x weekly - 3 sets - 10 reps - Seated Long Arc Quad  - 1 x daily - 7 x weekly - 3 sets - 10 reps - Heel Raises with Counter Support  - 1 x daily - 7 x weekly - 3 sets - 10 reps - Side Stepping with Resistance at Thighs and Counter Support  - 1 x daily - 7 x weekly - 3 sets - 10 reps  ASSESSMENT:  CLINICAL IMPRESSION: Discussed progress towards goals and continued POC with pt. Pt  continues with most difficulty with LT knee pain with stair negotiation and SLS. Continued program will focus on Lt knee strength to improve functional mobility with decreased pain.   OBJECTIVE IMPAIRMENTS: decreased activity tolerance, decreased balance, difficulty walking, decreased ROM, decreased strength, and pain.     GOALS: Goals reviewed with patient? Yes  SHORT TERM GOALS: Target date: 06/15/2023  Pt will be independent with initial HEP Baseline: Goal status: MET  2.  Pt will demo improved strength with 5 x STS <= 25 seconds Baseline:  Goal status: MET   LONG TERM GOALS: Target date: 09/14/2023   Pt will be  independent in advanced HEP Baseline:  Goal status: INITIAL  2.  Pt will demo improved strength with 5 x STS <= 20 seconds Baseline:  Goal status:MET  3.  Pt will negotiate 1 flight of stairs without handrail with reciprocal pattern Baseline:  Goal status: IN PROGRESS  4.  Pt will tolerate SLS x 10 seconds bilat LE to improve balance and reduce risk of falls Baseline:  Goal status: IN PROGRESS    PLAN:  PT FREQUENCY: 1x/week  PT DURATION: 6 weeks  PLANNED INTERVENTIONS: 97164- PT Re-evaluation, 97110-Therapeutic exercises, 97530- Therapeutic activity, [id number]- Neuromuscular re-education, 97535- Self Care, [id number]- Manual therapy, [id number]- Aquatic Therapy, 97014- Electrical stimulation (unattended), 97016- Vasopneumatic device, [id number]- Ionotophoresis 4mg /ml Dexamethasone, Patient/Family education, Balance training, Taping, Dry Needling, Cryotherapy, and Moist heat  PLAN FOR NEXT SESSION: work on stairs/step ups,Progress knee and hip strength, increase resistance as tolerated   Darice Conine, PT,DPT01/02/2509:59 AM

## 2023-08-04 ENCOUNTER — Ambulatory Visit: Payer: 59

## 2023-08-04 DIAGNOSIS — C73 Malignant neoplasm of thyroid gland: Secondary | ICD-10-CM | POA: Diagnosis not present

## 2023-08-04 DIAGNOSIS — E890 Postprocedural hypothyroidism: Secondary | ICD-10-CM | POA: Diagnosis not present

## 2023-08-10 ENCOUNTER — Ambulatory Visit: Payer: 59 | Admitting: Physical Therapy

## 2023-08-11 ENCOUNTER — Other Ambulatory Visit: Payer: Self-pay

## 2023-08-11 MED ORDER — METOPROLOL SUCCINATE ER 50 MG PO TB24: ORAL_TABLET | ORAL | 2 refills | Status: DC

## 2023-08-12 ENCOUNTER — Ambulatory Visit: Payer: 59 | Admitting: Physical Therapy

## 2023-08-12 ENCOUNTER — Encounter: Payer: Self-pay | Admitting: Physical Therapy

## 2023-08-12 DIAGNOSIS — G8929 Other chronic pain: Secondary | ICD-10-CM

## 2023-08-12 DIAGNOSIS — M25561 Pain in right knee: Secondary | ICD-10-CM | POA: Diagnosis not present

## 2023-08-12 DIAGNOSIS — M6281 Muscle weakness (generalized): Secondary | ICD-10-CM | POA: Diagnosis not present

## 2023-08-12 DIAGNOSIS — R262 Difficulty in walking, not elsewhere classified: Secondary | ICD-10-CM | POA: Diagnosis not present

## 2023-08-12 DIAGNOSIS — M25562 Pain in left knee: Secondary | ICD-10-CM | POA: Diagnosis not present

## 2023-08-12 NOTE — Therapy (Signed)
OUTPATIENT PHYSICAL THERAPY TREATMENT AND RECERTIFICATION AND 10th VISIT NOTE   Patient Name: Sara Gardner MRN: [id number] DOB:[birth year], 73 y.o., female Today's Date: 08/12/2023 Dates of service: 05/18/23-08/03/23 END OF SESSION:  PT End of Session - 08/12/23 1052     Visit Number 11    Number of Visits 16    Date for PT Re-Evaluation 09/14/23    Authorization Type [employer] Medicare    Authorization - Visit Number 11    Progress Note Due on Visit 20    PT Start Time 1015    PT Stop Time 1053    PT Time Calculation (min) 38 min    Activity Tolerance Patient tolerated treatment well    Behavior During Therapy WFL for tasks assessed/performed                      Past Medical History:  Diagnosis Date   Aortic atherosclerosis (HCC) 06/10/2017   CAD (coronary artery disease)    Cancer (HCC)    Combined form of age-related cataract, both eyes 04/07/2017   Dermatochalasis of both eyelids 04/07/2017   Diabetes mellitus without complication (HCC)    GERD (gastroesophageal reflux disease)    Hypertension    Hypothyroidism    Lacunar infarction (HCC) 07/20/2018   Left renal mass 10/04/2018   1.7 cm hypoechoic mass 10/04/2018   Lumbar degenerative disc disease 03/09/2017   Myocardial infarction (HCC) 2005   Normocytic anemia 12/14/2018   Palpitations    Papillary thyroid carcinoma (HCC)    Posterior vitreous detachment, right eye 04/07/2017   Past Surgical History:  Procedure Laterality Date   TOTAL THYROIDECTOMY  2016   Patient Active Problem List   Diagnosis Date Noted   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/05/2019   Cervicalgia 04/05/2019   Eustachian tube dysfunction, bilateral 04/05/2019   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   White matter abnormality on MRI of brain 04/05/2019   Myalgia due to statin 12/14/2018   Normocytic anemia 12/14/2018   At moderate risk for fall 10/03/2018   Bilateral renal cysts 10/03/2018   Facet arthropathy,  lumbosacral 10/03/2018   Lacunar infarction (HCC) 07/20/2018   Controlled type 2 diabetes mellitus without complication, without long-term current use of insulin (HCC) 03/08/2018   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/23/2017   Overactive bladder 08/17/2017   Equinus contracture of ankle 07/27/2017   Hammer toes of both feet 07/27/2017   Dermatophytosis, nail 07/27/2017   Calcaneal spur of both feet 07/27/2017   Aortic atherosclerosis (HCC) 06/10/2017   Combined form of age-related cataract, both eyes 04/07/2017   Dermatochalasis of both eyelids 04/07/2017   Posterior vitreous detachment, right eye 04/07/2017   Postural kyphosis of cervicothoracic region 03/09/2017   Lumbar degenerative disc disease 03/09/2017   Bilateral primary osteoarthritis of knee 12/15/2016   Allergy to honey bee venom 12/15/2016   Hypertension 02/16/2016   DM type 2 with diabetic dyslipidemia (HCC) 02/10/2016   Gastroesophageal reflux disease without esophagitis 02/10/2016   Left ventricular diastolic dysfunction, NYHA class 1 01/28/2016   Hypothyroidism, 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   Urticaria 12/30/2015   Diabetic sensorimotor polyneuropathy (HCC) 10/27/2015   Low back strain 10/27/2015   Right inguinal pain 04/21/2015   Healthcare maintenance 12/30/2014   Skin rash 09/16/2014   Spider veins of both lower extremities 09/16/2014   Thyroid cancer (HCC) 08/17/2014   Papillary thyroid carcinoma (HCC) 08/09/2014   Post-menopausal atrophic vaginitis 06/24/2014   Nodule of left lung 01/21/2014   Class 1 obesity due  to excess calories with serious comorbidity and body mass index (BMI) of 30.0 to 30.9 in adult 06/12/2012   Palpitations 11/29/2011   Cutaneous horn 09/29/2011   Abnormal mammogram of right breast 08/30/2011   Arthritis of left knee 05/24/2011   CAD (coronary artery disease) 05/24/2011   History of acute myocardial infarction of inferior wall 05/24/2011   Gastro-esophageal reflux  disease with esophagitis 05/24/2011   Hyperlipidemia 05/24/2011    PCP: Christen Butter  REFERRING PROVIDER: Christen Butter  REFERRING DIAG: pain in bilateral lower extremities  THERAPY DIAG:  Chronic pain of both knees  Muscle weakness (generalized)  Rationale for Evaluation and Treatment: Rehabilitation  ONSET DATE: 03/2023  SUBJECTIVE:   SUBJECTIVE STATEMENT: Patient reports she has had a bad cold and has not been moving around as much. She continues with LT knee pain  PERTINENT HISTORY: CAD, DM  Pt states that a month ago her legs swelled up and began burning. She states she has had difficulty with stairs and has even had to walk with a cane due to pain. She states she has pain in both knees, as well as burning feeling but only when she is at home. She states that she has had to be more cautious with walking due to increased fear of falling due to pain. PAIN:  Are you having pain? Yes: NPRS scale: 4/10 Pain location: bilateral knees Pain description: sore/achey Aggravating factors: stairs, walking Relieving factors: rest, voltaren  PRECAUTIONS: None  RED FLAGS: None   WEIGHT BEARING RESTRICTIONS: No  FALLS:  Has patient fallen in last 6 months? No  LIVING ENVIRONMENT: Lives with: lives alone Lives in: House/apartment Stairs: Yes: External: 5 steps; bilateral but cannot reach both Has following equipment at home: Single point cane  OCCUPATION: just retired from [employer], active with her church  PLOF: Independent  PATIENT GOALS: decrease pain  NEXT MD VISIT: PRN  OBJECTIVE:  Note: Objective measures were completed at Evaluation unless otherwise noted.  DIAGNOSTIC FINDINGS: Ultrasound: negative for DVT  COGNITION: Overall cognitive status: Within functional limits for tasks assessed     SENSATION: Pt c/o burning in bilateral LEs   MUSCLE LENGTH: Hamstrings: Right 95 deg; Left 95 deg   PALPATION: TTP bilat lateral, medial and posterior  knees Patella mobility WFL  LOWER EXTREMITY ROM:  AROM ROM Right eval Left eval 06/08/23  Hip flexion     Hip extension     Hip abduction     Hip adduction     Hip internal rotation     Hip external rotation     Knee flexion 109 110 Lt: 112; Rt:113   Knee extension 0 0   Ankle dorsiflexion     Ankle plantarflexion     Ankle inversion     Ankle eversion      (Blank rows = not tested)  LOWER EXTREMITY MMT:  MMT Right eval Left eval  Hip flexion 4- 4  Hip extension 3+ 3+  Hip abduction 3+ 3+  Hip adduction    Hip internal rotation    Hip external rotation    Knee flexion 4- 4-  Knee extension 4 4  Ankle dorsiflexion    Ankle plantarflexion    Ankle inversion    Ankle eversion     (Blank rows = not tested)  LOWER EXTREMITY SPECIAL TESTS:  Ober's negative bilat  FUNCTIONAL TESTS:  5 times sit to stand: 30.10 seconds  (eval), 16.14 seconds (11/22) SLS: unable due to pain  GAIT: Stairs:  pt leads with Rt LE due to Lt knee pain. Needs bilat handrails. Step to pattern   TODAY'S TREATMENT:      [facility] Adult PT Treatment:                                                DATE: 08/12/23 Therapeutic Exercise: LAQ 4# Rt, 2# Lt 2 x 15 SLR Rt LE 2#  2 x 10, Lt LE 2 x 10 Step up 2'' step forward and laterally Lt LE 2 x 10 Tap up 8'' step Lt LE x 20 Semi tandem stance on foam 2 x 30 seconds Side step red TB - UE support Backward walking red TB - UE support Bridge 2 x 10 Clam 2 x 10 bilat   [facility] Adult PT Treatment:                                                DATE: 08/03/23 Therapeutic Exercise: Nustep L6 x 5 min Stair negotiation with bilat handrails - pt requires step to pattern when ascending stairs with Lt LE Step up 2'' step forward and laterally Lt LE 2 x 10 SLS Rt LE: 20 seconds, Lt LE: 3 seconds Semi tandem stance on foam 2 x 30 seconds LAQ 4# Rt, 2# Lt 2 x 15 SLR Rt LE 4# x 10, Lt LE x 10 Bridge x 20 Sidelying hip abd 2 x 10 bilat   [facility] Adult PT  Treatment:                                                DATE: 07/06/23 Therapeutic Exercise: Nustep L6 x 5 min Hip abd green TB around knees 2 x 10 Hip ext green TB 2 x 10 Lateral step up Rt LE 4'' step x 12 Lateral step up Lt LE 2'' step x 20 LAQ 5# Rt, 2.5# Lt 2 x 15 Seated march x 20 4# bilat SLR x 20 bilat Bridge x 20 Sidelying hip abd 2 x 10 bilat   [facility] Adult PT Treatment:                                                DATE: 07/01/23 Therapeutic Exercise: Nustep L6 x 5 min Hip abd green TB around knees 2 x 10 Hip ext green TB 2 x 10 LAQ 4# Rt, 2# Lt 2 x 15 SLR 10 x 5 sec hold bilat Bridge 2 x 10 Sidelying hip abd 2 x 10 bilat Rt SLS 10 seconds, Lt SLS 6 seconds   PATIENT EDUCATION:  Education details: Updated HEP Person educated: Patient Education method: Explanation Education comprehension: verbalized understanding  HOME EXERCISE PROGRAM: Access Code: [email or web address] URL: [email or web address] Date: 06/29/2023 Prepared by: Carlynn Herald  Exercises - Seated Calf Stretch with Strap  - 1 x daily - 7 x weekly - 1 sets - 3 reps - 20-30 seconds hold - Seated Hamstring Stretch  - 1 x daily - 7 x  weekly - 1 sets - 3 reps - 20-30 seconds hold - Sit to Stand with Armchair  - 1 x daily - 7 x weekly - 2 sets - 10 reps - Supine Short Arc Quad  - 1 x daily - 7 x weekly - 3 sets - 10 reps - Seated Long Arc Quad  - 1 x daily - 7 x weekly - 3 sets - 10 reps - Heel Raises with Counter Support  - 1 x daily - 7 x weekly - 3 sets - 10 reps - Side Stepping with Resistance at Thighs and Counter Support  - 1 x daily - 7 x weekly - 3 sets - 10 reps  ASSESSMENT:  CLINICAL IMPRESSION: Pt requires more rest breaks today due to fatigue from being sick. She had good tolerance to tap ups, just painful with wt bearing on Lt LE. Improving tolerance to 2'' step ups, may attempt 4'' next visit   OBJECTIVE IMPAIRMENTS: decreased activity tolerance, decreased balance, difficulty  walking, decreased ROM, decreased strength, and pain.     GOALS: Goals reviewed with patient? Yes  SHORT TERM GOALS: Target date: 06/15/2023  Pt will be independent with initial HEP Baseline: Goal status: MET  2.  Pt will demo improved strength with 5 x STS <= 25 seconds Baseline:  Goal status: MET   LONG TERM GOALS: Target date: 09/14/2023   Pt will be independent in advanced HEP Baseline:  Goal status: INITIAL  2.  Pt will demo improved strength with 5 x STS <= 20 seconds Baseline:  Goal status:MET  3.  Pt will negotiate 1 flight of stairs without handrail with reciprocal pattern Baseline:  Goal status: IN PROGRESS  4.  Pt will tolerate SLS x 10 seconds bilat LE to improve balance and reduce risk of falls Baseline:  Goal status: IN PROGRESS    PLAN:  PT FREQUENCY: 1x/week  PT DURATION: 6 weeks  PLANNED INTERVENTIONS: 97164- PT Re-evaluation, 97110-Therapeutic exercises, 97530- Therapeutic activity, [id number]- Neuromuscular re-education, 97535- Self Care, 16109- Manual therapy, [id number]- Aquatic Therapy, 97014- Electrical stimulation (unattended), 97016- Vasopneumatic device, 97033- Ionotophoresis 4mg /ml Dexamethasone, Patient/Family education, Balance training, Taping, Dry Needling, Cryotherapy, and Moist heat  PLAN FOR NEXT SESSION: work on stairs/step ups,Progress knee and hip strength, increase resistance as tolerated   Reggy Eye, PT,DPT01/17/2510:53 AM

## 2023-08-17 ENCOUNTER — Ambulatory Visit: Payer: 59 | Admitting: Physical Therapy

## 2023-08-17 ENCOUNTER — Encounter: Payer: Self-pay | Admitting: Physical Therapy

## 2023-08-17 DIAGNOSIS — M6281 Muscle weakness (generalized): Secondary | ICD-10-CM

## 2023-08-17 DIAGNOSIS — G8929 Other chronic pain: Secondary | ICD-10-CM

## 2023-08-17 DIAGNOSIS — R262 Difficulty in walking, not elsewhere classified: Secondary | ICD-10-CM | POA: Diagnosis not present

## 2023-08-17 DIAGNOSIS — M25561 Pain in right knee: Secondary | ICD-10-CM | POA: Diagnosis not present

## 2023-08-17 DIAGNOSIS — M25562 Pain in left knee: Secondary | ICD-10-CM | POA: Diagnosis not present

## 2023-08-17 NOTE — Therapy (Signed)
OUTPATIENT PHYSICAL THERAPY TREATMENT    Patient Name: Fonnie Casado MRN: [id number] DOB:1951/04/18, 73 y.o., female Today's Date: 08/17/2023 Dates of service: 05/18/23-08/03/23 END OF SESSION:  PT End of Session - 08/17/23 0916     Visit Number 12    Number of Visits 16    Date for PT Re-Evaluation 09/14/23    Authorization Type [employer] Medicare    Authorization - Visit Number 12    Progress Note Due on Visit 20    PT Start Time 0845    PT Stop Time 0923    PT Time Calculation (min) 38 min    Activity Tolerance Patient tolerated treatment well    Behavior During Therapy [facility] for tasks assessed/performed                       Past Medical History:  Diagnosis Date   Aortic atherosclerosis (HCC) 06/10/2017   CAD (coronary artery disease)    Cancer (HCC)    Combined form of age-related cataract, both eyes 04/07/2017   Dermatochalasis of both eyelids 04/07/2017   Diabetes mellitus without complication (HCC)    GERD (gastroesophageal reflux disease)    Hypertension    Hypothyroidism    Lacunar infarction (HCC) 07/20/2018   Left renal mass 10/04/2018   1.7 cm hypoechoic mass 10/04/2018   Lumbar degenerative disc disease 03/09/2017   Myocardial infarction (HCC) 2005   Normocytic anemia 12/14/2018   Palpitations    Papillary thyroid carcinoma (HCC)    Posterior vitreous detachment, right eye 04/07/2017   Past Surgical History:  Procedure Laterality Date   TOTAL THYROIDECTOMY  2016   Patient Active Problem List   Diagnosis Date Noted   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/05/2019   Cervicalgia 04/05/2019   Eustachian tube dysfunction, bilateral 04/05/2019   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   White matter abnormality on MRI of brain 04/05/2019   Myalgia due to statin 12/14/2018   Normocytic anemia 12/14/2018   At moderate risk for fall 10/03/2018   Bilateral renal cysts 10/03/2018   Facet arthropathy, lumbosacral 10/03/2018   Lacunar infarction  (HCC) 07/20/2018   Controlled type 2 diabetes mellitus without complication, without long-term current use of insulin (HCC) 03/08/2018   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/23/2017   Overactive bladder 08/17/2017   Equinus contracture of ankle 07/27/2017   Hammer toes of both feet 07/27/2017   Dermatophytosis, nail 07/27/2017   Calcaneal spur of both feet 07/27/2017   Aortic atherosclerosis (HCC) 06/10/2017   Combined form of age-related cataract, both eyes 04/07/2017   Dermatochalasis of both eyelids 04/07/2017   Posterior vitreous detachment, right eye 04/07/2017   Postural kyphosis of cervicothoracic region 03/09/2017   Lumbar degenerative disc disease 03/09/2017   Bilateral primary osteoarthritis of knee 12/15/2016   Allergy to honey bee venom 12/15/2016   Hypertension 02/16/2016   DM type 2 with diabetic dyslipidemia (HCC) 02/10/2016   Gastroesophageal reflux disease without esophagitis 02/10/2016   Left ventricular diastolic dysfunction, NYHA class 1 01/28/2016   Hypothyroidism, 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   Urticaria 12/30/2015   Diabetic sensorimotor polyneuropathy (HCC) 10/27/2015   Low back strain 10/27/2015   Right inguinal pain 04/21/2015   Healthcare maintenance 12/30/2014   Skin rash 09/16/2014   Spider veins of both lower extremities 09/16/2014   Thyroid cancer (HCC) 08/17/2014   Papillary thyroid carcinoma (HCC) 08/09/2014   Post-menopausal atrophic vaginitis 06/24/2014   Nodule of left lung 01/21/2014   Class 1 obesity due to excess calories with  serious comorbidity and body mass index (BMI) of 30.0 to 30.9 in adult 06/12/2012   Palpitations 11/29/2011   Cutaneous horn 09/29/2011   Abnormal mammogram of right breast 08/30/2011   Arthritis of left knee 05/24/2011   CAD (coronary artery disease) 05/24/2011   History of acute myocardial infarction of inferior wall 05/24/2011   Gastro-esophageal reflux disease with esophagitis 05/24/2011    Hyperlipidemia 05/24/2011    PCP: Christen Butter  REFERRING PROVIDER: Christen Butter  REFERRING DIAG: pain in bilateral lower extremities  THERAPY DIAG:  Chronic pain of both knees  Muscle weakness (generalized)  Rationale for Evaluation and Treatment: Rehabilitation  ONSET DATE: 03/2023  SUBJECTIVE:   SUBJECTIVE STATEMENT: Pt states she feels better. She states her knees have been ok in the cold weather  PERTINENT HISTORY: CAD, DM  Pt states that a month ago her legs swelled up and began burning. She states she has had difficulty with stairs and has even had to walk with a cane due to pain. She states she has pain in both knees, as well as burning feeling but only when she is at home. She states that she has had to be more cautious with walking due to increased fear of falling due to pain. PAIN:  Are you having pain? Yes: NPRS scale: 4/10 Pain location: bilateral knees Pain description: sore/achey Aggravating factors: stairs, walking Relieving factors: rest, voltaren  PRECAUTIONS: None  RED FLAGS: None   WEIGHT BEARING RESTRICTIONS: No  FALLS:  Has patient fallen in last 6 months? No  LIVING ENVIRONMENT: Lives with: lives alone Lives in: House/apartment Stairs: Yes: External: 5 steps; bilateral but cannot reach both Has following equipment at home: Single point cane  OCCUPATION: just retired from [employer], active with her church  PLOF: Independent  PATIENT GOALS: decrease pain  NEXT MD VISIT: PRN  OBJECTIVE:  Note: Objective measures were completed at Evaluation unless otherwise noted.  DIAGNOSTIC FINDINGS: Ultrasound: negative for DVT  COGNITION: Overall cognitive status: Within functional limits for tasks assessed     SENSATION: Pt c/o burning in bilateral LEs   MUSCLE LENGTH: Hamstrings: Right 95 deg; Left 95 deg   PALPATION: TTP bilat lateral, medial and posterior knees Patella mobility WFL  LOWER EXTREMITY ROM:  AROM ROM Right eval  Left eval 06/08/23  Hip flexion     Hip extension     Hip abduction     Hip adduction     Hip internal rotation     Hip external rotation     Knee flexion 109 110 Lt: 112; Rt:113   Knee extension 0 0   Ankle dorsiflexion     Ankle plantarflexion     Ankle inversion     Ankle eversion      (Blank rows = not tested)  LOWER EXTREMITY MMT:  MMT Right eval Left eval  Hip flexion 4- 4  Hip extension 3+ 3+  Hip abduction 3+ 3+  Hip adduction    Hip internal rotation    Hip external rotation    Knee flexion 4- 4-  Knee extension 4 4  Ankle dorsiflexion    Ankle plantarflexion    Ankle inversion    Ankle eversion     (Blank rows = not tested)  LOWER EXTREMITY SPECIAL TESTS:  Ober's negative bilat  FUNCTIONAL TESTS:  5 times sit to stand: 30.10 seconds  (eval), 16.14 seconds (11/22) SLS: unable due to pain  GAIT: Stairs: pt leads with Rt LE due to Lt knee pain.  Needs bilat handrails. Step to pattern   TODAY'S TREATMENT:      [facility] Adult PT Treatment:                                                DATE: 08/17/23 Therapeutic Exercise: Nustep L6 x 5 min Hip abd red TB x 20 Hip ext red TB x 20 HS curl red TB x 20 Step up 4'' step x 10 bilat Lateral step up 4'' step x 10 bilat LAQ 4# Rt, 2# Lt 2 x 20 HS curl green TB x 20 bilat Bridge x 20 SLR 2 x 10 bilat Sidelying clam x 20 bilat Supine hip abd green TB x 20 STS x 10   [facility] Adult PT Treatment:                                                DATE: 08/12/23 Therapeutic Exercise: LAQ 4# Rt, 2# Lt 2 x 15 SLR Rt LE 2#  2 x 10, Lt LE 2 x 10 Step up 2'' step forward and laterally Lt LE 2 x 10 Tap up 8'' step Lt LE x 20 Semi tandem stance on foam 2 x 30 seconds Side step red TB - UE support Backward walking red TB - UE support Bridge 2 x 10 Clam 2 x 10 bilat   [facility] Adult PT Treatment:                                                DATE: 08/03/23 Therapeutic Exercise: Nustep L6 x 5 min Stair negotiation with bilat  handrails - pt requires step to pattern when ascending stairs with Lt LE Step up 2'' step forward and laterally Lt LE 2 x 10 SLS Rt LE: 20 seconds, Lt LE: 3 seconds Semi tandem stance on foam 2 x 30 seconds LAQ 4# Rt, 2# Lt 2 x 15 SLR Rt LE 4# x 10, Lt LE x 10 Bridge x 20 Sidelying hip abd 2 x 10 bilat   PATIENT EDUCATION:  Education details: Updated HEP Person educated: Patient Education method: Explanation Education comprehension: verbalized understanding  HOME EXERCISE PROGRAM: Access Code: [email or web address] URL: [email or web address] Date: 06/29/2023 Prepared by: Carlynn Herald  Exercises - Seated Calf Stretch with Strap  - 1 x daily - 7 x weekly - 1 sets - 3 reps - 20-30 seconds hold - Seated Hamstring Stretch  - 1 x daily - 7 x weekly - 1 sets - 3 reps - 20-30 seconds hold - Sit to Stand with Armchair  - 1 x daily - 7 x weekly - 2 sets - 10 reps - Supine Short Arc Quad  - 1 x daily - 7 x weekly - 3 sets - 10 reps - Seated Long Arc Quad  - 1 x daily - 7 x weekly - 3 sets - 10 reps - Heel Raises with Counter Support  - 1 x daily - 7 x weekly - 3 sets - 10 reps - Side Stepping with Resistance at Thighs and Counter Support  - 1 x daily - 7 x weekly -  3 sets - 10 reps  ASSESSMENT:  CLINICAL IMPRESSION: Pt able to tolerate 4'' step ups today. She is improving activity and exercise tolerance. Progressing well towards goals   OBJECTIVE IMPAIRMENTS: decreased activity tolerance, decreased balance, difficulty walking, decreased ROM, decreased strength, and pain.     GOALS: Goals reviewed with patient? Yes  SHORT TERM GOALS: Target date: 06/15/2023  Pt will be independent with initial HEP Baseline: Goal status: MET  2.  Pt will demo improved strength with 5 x STS <= 25 seconds Baseline:  Goal status: MET   LONG TERM GOALS: Target date: 09/14/2023   Pt will be independent in advanced HEP Baseline:  Goal status: INITIAL  2.  Pt will demo improved strength  with 5 x STS <= 20 seconds Baseline:  Goal status:MET  3.  Pt will negotiate 1 flight of stairs without handrail with reciprocal pattern Baseline:  Goal status: IN PROGRESS  4.  Pt will tolerate SLS x 10 seconds bilat LE to improve balance and reduce risk of falls Baseline:  Goal status: IN PROGRESS    PLAN:  PT FREQUENCY: 1x/week  PT DURATION: 6 weeks  PLANNED INTERVENTIONS: 97164- PT Re-evaluation, 97110-Therapeutic exercises, 97530- Therapeutic activity, [id number]- Neuromuscular re-education, 97535- Self Care, [id number]- Manual therapy, [id number]- Aquatic Therapy, 97014- Electrical stimulation (unattended), 97016- Vasopneumatic device, [id number]- Ionotophoresis 4mg /ml Dexamethasone, Patient/Family education, Balance training, Taping, Dry Needling, Cryotherapy, and Moist heat  PLAN FOR NEXT SESSION: work on stairs/step ups,Progress knee and hip strength, increase resistance as tolerated   Reggy Eye, PT,DPT01/22/259:21 AM

## 2023-08-18 ENCOUNTER — Other Ambulatory Visit: Payer: Self-pay | Admitting: Medical-Surgical

## 2023-08-18 DIAGNOSIS — E1169 Type 2 diabetes mellitus with other specified complication: Secondary | ICD-10-CM

## 2023-08-24 ENCOUNTER — Ambulatory Visit: Payer: 59 | Admitting: Physical Therapy

## 2023-08-24 ENCOUNTER — Encounter: Payer: Self-pay | Admitting: Physical Therapy

## 2023-08-24 DIAGNOSIS — M25561 Pain in right knee: Secondary | ICD-10-CM | POA: Diagnosis not present

## 2023-08-24 DIAGNOSIS — M6281 Muscle weakness (generalized): Secondary | ICD-10-CM | POA: Diagnosis not present

## 2023-08-24 DIAGNOSIS — R262 Difficulty in walking, not elsewhere classified: Secondary | ICD-10-CM | POA: Diagnosis not present

## 2023-08-24 DIAGNOSIS — G8929 Other chronic pain: Secondary | ICD-10-CM | POA: Diagnosis not present

## 2023-08-24 DIAGNOSIS — M25562 Pain in left knee: Secondary | ICD-10-CM | POA: Diagnosis not present

## 2023-08-24 NOTE — Therapy (Signed)
OUTPATIENT PHYSICAL THERAPY TREATMENT    Patient Name: Sara Gardner MRN: [id number] DOB:05-10-1951, 73 y.o., female Today's Date: 08/24/2023 Dates of service: 05/18/23-08/03/23 END OF SESSION:  PT End of Session - 08/24/23 0926     Visit Number 13    Number of Visits 16    Date for PT Re-Evaluation 09/14/23    Authorization Type [employer] Medicare    Authorization - Visit Number 13    Progress Note Due on Visit 20    PT Start Time 0848    PT Stop Time 0927    PT Time Calculation (min) 39 min    Activity Tolerance Patient tolerated treatment well    Behavior During Therapy [facility] for tasks assessed/performed                        Past Medical History:  Diagnosis Date   Aortic atherosclerosis (HCC) 06/10/2017   CAD (coronary artery disease)    Cancer (HCC)    Combined form of age-related cataract, both eyes 04/07/2017   Dermatochalasis of both eyelids 04/07/2017   Diabetes mellitus without complication (HCC)    GERD (gastroesophageal reflux disease)    Hypertension    Hypothyroidism    Lacunar infarction (HCC) 07/20/2018   Left renal mass 10/04/2018   1.7 cm hypoechoic mass 10/04/2018   Lumbar degenerative disc disease 03/09/2017   Myocardial infarction (HCC) 2005   Normocytic anemia 12/14/2018   Palpitations    Papillary thyroid carcinoma (HCC)    Posterior vitreous detachment, right eye 04/07/2017   Past Surgical History:  Procedure Laterality Date   TOTAL THYROIDECTOMY  2016   Patient Active Problem List   Diagnosis Date Noted   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/05/2019   Cervicalgia 04/05/2019   Eustachian tube dysfunction, bilateral 04/05/2019   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   White matter abnormality on MRI of brain 04/05/2019   Myalgia due to statin 12/14/2018   Normocytic anemia 12/14/2018   At moderate risk for fall 10/03/2018   Bilateral renal cysts 10/03/2018   Facet arthropathy, lumbosacral 10/03/2018   Lacunar infarction  (HCC) 07/20/2018   Controlled type 2 diabetes mellitus without complication, without long-term current use of insulin (HCC) 03/08/2018   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/23/2017   Overactive bladder 08/17/2017   Equinus contracture of ankle 07/27/2017   Hammer toes of both feet 07/27/2017   Dermatophytosis, nail 07/27/2017   Calcaneal spur of both feet 07/27/2017   Aortic atherosclerosis (HCC) 06/10/2017   Combined form of age-related cataract, both eyes 04/07/2017   Dermatochalasis of both eyelids 04/07/2017   Posterior vitreous detachment, right eye 04/07/2017   Postural kyphosis of cervicothoracic region 03/09/2017   Lumbar degenerative disc disease 03/09/2017   Bilateral primary osteoarthritis of knee 12/15/2016   Allergy to honey bee venom 12/15/2016   Hypertension 02/16/2016   DM type 2 with diabetic dyslipidemia (HCC) 02/10/2016   Gastroesophageal reflux disease without esophagitis 02/10/2016   Left ventricular diastolic dysfunction, NYHA class 1 01/28/2016   Hypothyroidism, 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   Urticaria 12/30/2015   Diabetic sensorimotor polyneuropathy (HCC) 10/27/2015   Low back strain 10/27/2015   Right inguinal pain 04/21/2015   Healthcare maintenance 12/30/2014   Skin rash 09/16/2014   Spider veins of both lower extremities 09/16/2014   Thyroid cancer (HCC) 08/17/2014   Papillary thyroid carcinoma (HCC) 08/09/2014   Post-menopausal atrophic vaginitis 06/24/2014   Nodule of left lung 01/21/2014   Class 1 obesity due to excess calories  with serious comorbidity and body mass index (BMI) of 30.0 to 30.9 in adult 06/12/2012   Palpitations 11/29/2011   Cutaneous horn 09/29/2011   Abnormal mammogram of right breast 08/30/2011   Arthritis of left knee 05/24/2011   CAD (coronary artery disease) 05/24/2011   History of acute myocardial infarction of inferior wall 05/24/2011   Gastro-esophageal reflux disease with esophagitis 05/24/2011    Hyperlipidemia 05/24/2011    PCP: Christen Butter  REFERRING PROVIDER: Christen Butter  REFERRING DIAG: pain in bilateral lower extremities  THERAPY DIAG:  Chronic pain of both knees  Muscle weakness (generalized)  Rationale for Evaluation and Treatment: Rehabilitation  ONSET DATE: 03/2023  SUBJECTIVE:   SUBJECTIVE STATEMENT: Pt states she has a follow up with MD on 2/10. She does not feel like her knee is any different than last visit  PERTINENT HISTORY: CAD, DM  Pt states that a month ago her legs swelled up and began burning. She states she has had difficulty with stairs and has even had to walk with a cane due to pain. She states she has pain in both knees, as well as burning feeling but only when she is at home. She states that she has had to be more cautious with walking due to increased fear of falling due to pain. PAIN:  Are you having pain? Yes: NPRS scale: 4/10 Pain location: bilateral knees Pain description: sore/achey Aggravating factors: stairs, walking Relieving factors: rest, voltaren  PRECAUTIONS: None  RED FLAGS: None   WEIGHT BEARING RESTRICTIONS: No  FALLS:  Has patient fallen in last 6 months? No  LIVING ENVIRONMENT: Lives with: lives alone Lives in: House/apartment Stairs: Yes: External: 5 steps; bilateral but cannot reach both Has following equipment at home: Single point cane  OCCUPATION: just retired from [employer], active with her church  PLOF: Independent  PATIENT GOALS: decrease pain  NEXT MD VISIT: PRN  OBJECTIVE:  Note: Objective measures were completed at Evaluation unless otherwise noted.  DIAGNOSTIC FINDINGS: Ultrasound: negative for DVT  COGNITION: Overall cognitive status: Within functional limits for tasks assessed     SENSATION: Pt c/o burning in bilateral LEs   MUSCLE LENGTH: Hamstrings: Right 95 deg; Left 95 deg   PALPATION: TTP bilat lateral, medial and posterior knees Patella mobility WFL  LOWER EXTREMITY  ROM:  AROM ROM Right eval Left eval 06/08/23  Hip flexion     Hip extension     Hip abduction     Hip adduction     Hip internal rotation     Hip external rotation     Knee flexion 109 110 Lt: 112; Rt:113   Knee extension 0 0   Ankle dorsiflexion     Ankle plantarflexion     Ankle inversion     Ankle eversion      (Blank rows = not tested)  LOWER EXTREMITY MMT:  MMT Right eval Left eval  Hip flexion 4- 4  Hip extension 3+ 3+  Hip abduction 3+ 3+  Hip adduction    Hip internal rotation    Hip external rotation    Knee flexion 4- 4-  Knee extension 4 4  Ankle dorsiflexion    Ankle plantarflexion    Ankle inversion    Ankle eversion     (Blank rows = not tested)  LOWER EXTREMITY SPECIAL TESTS:  Ober's negative bilat  FUNCTIONAL TESTS:  5 times sit to stand: 30.10 seconds  (eval), 16.14 seconds (11/22) SLS: unable due to pain  GAIT: Stairs: pt  leads with Rt LE due to Lt knee pain. Needs bilat handrails. Step to pattern   TODAY'S TREATMENT:      [facility] Adult PT Treatment:                                                DATE: 08/24/23 Therapeutic Exercise: Hip abd green TB x 20 Hip ext green TB x 20 HS curl green TB x 20 Step ups 4'' step 2 x 10 bilat Lateral step ups x 10 bilat 4'' STS x 10  Modalities: TENS Lt knee x 10 min to tolerance  [facility] Adult PT Treatment:                                                DATE: 08/17/23 Therapeutic Exercise: Nustep L6 x 5 min Hip abd red TB x 20 Hip ext red TB x 20 HS curl red TB x 20 Step up 4'' step x 10 bilat Lateral step up 4'' step x 10 bilat LAQ 4# Rt, 2# Lt 2 x 20 HS curl green TB x 20 bilat Bridge x 20 SLR 2 x 10 bilat Sidelying clam x 20 bilat Supine hip abd green TB x 20 STS x 10   [facility] Adult PT Treatment:                                                DATE: 08/12/23 Therapeutic Exercise: LAQ 4# Rt, 2# Lt 2 x 15 SLR Rt LE 2#  2 x 10, Lt LE 2 x 10 Step up 2'' step forward and laterally Lt LE 2 x  10 Tap up 8'' step Lt LE x 20 Semi tandem stance on foam 2 x 30 seconds Side step red TB - UE support Backward walking red TB - UE support Bridge 2 x 10 Clam 2 x 10 bilat   PATIENT EDUCATION:  Education details: Updated HEP Person educated: Patient Education method: Explanation Education comprehension: verbalized understanding  HOME EXERCISE PROGRAM: Access Code: [email or web address] URL: [email or web address] Date: 06/29/2023 Prepared by: Carlynn Herald  Exercises - Seated Calf Stretch with Strap  - 1 x daily - 7 x weekly - 1 sets - 3 reps - 20-30 seconds hold - Seated Hamstring Stretch  - 1 x daily - 7 x weekly - 1 sets - 3 reps - 20-30 seconds hold - Sit to Stand with Armchair  - 1 x daily - 7 x weekly - 2 sets - 10 reps - Supine Short Arc Quad  - 1 x daily - 7 x weekly - 3 sets - 10 reps - Seated Long Arc Quad  - 1 x daily - 7 x weekly - 3 sets - 10 reps - Heel Raises with Counter Support  - 1 x daily - 7 x weekly - 3 sets - 10 reps - Side Stepping with Resistance at Thighs and Counter Support  - 1 x daily - 7 x weekly - 3 sets - 10 reps  ASSESSMENT:  CLINICAL IMPRESSION: Trial of TENS today for pain relief Lt knee. Good response. Pt given handout on TENS  and how to purchase one for home use.   OBJECTIVE IMPAIRMENTS: decreased activity tolerance, decreased balance, difficulty walking, decreased ROM, decreased strength, and pain.     GOALS: Goals reviewed with patient? Yes  SHORT TERM GOALS: Target date: 06/15/2023  Pt will be independent with initial HEP Baseline: Goal status: MET  2.  Pt will demo improved strength with 5 x STS <= 25 seconds Baseline:  Goal status: MET   LONG TERM GOALS: Target date: 09/14/2023   Pt will be independent in advanced HEP Baseline:  Goal status: INITIAL  2.  Pt will demo improved strength with 5 x STS <= 20 seconds Baseline:  Goal status:MET  3.  Pt will negotiate 1 flight of stairs without handrail with reciprocal  pattern Baseline:  Goal status: IN PROGRESS  4.  Pt will tolerate SLS x 10 seconds bilat LE to improve balance and reduce risk of falls Baseline:  Goal status: IN PROGRESS    PLAN:  PT FREQUENCY: 1x/week  PT DURATION: 6 weeks  PLANNED INTERVENTIONS: 97164- PT Re-evaluation, 97110-Therapeutic exercises, 97530- Therapeutic activity, [id number]- Neuromuscular re-education, 97535- Self Care, 16109- Manual therapy, [id number]- Aquatic Therapy, 97014- Electrical stimulation (unattended), 97016- Vasopneumatic device, [id number]- Ionotophoresis 4mg /ml Dexamethasone, Patient/Family education, Balance training, Taping, Dry Needling, Cryotherapy, and Moist heat  PLAN FOR NEXT SESSION: work on stairs/step ups,Progress knee and hip strength, increase resistance as tolerated   Reggy Eye, PT,DPT01/29/259:28 AM

## 2023-08-25 ENCOUNTER — Encounter (HOSPITAL_BASED_OUTPATIENT_CLINIC_OR_DEPARTMENT_OTHER): Payer: Self-pay

## 2023-08-25 ENCOUNTER — Other Ambulatory Visit: Payer: Self-pay

## 2023-08-25 ENCOUNTER — Ambulatory Visit
Admission: EM | Admit: 2023-08-25 | Discharge: 2023-08-25 | Disposition: A | Discharge status: Home / Self Care | Payer: 59

## 2023-08-25 ENCOUNTER — Emergency Department (HOSPITAL_BASED_OUTPATIENT_CLINIC_OR_DEPARTMENT_OTHER)
Admission: EM | Admit: 2023-08-25 | Discharge: 2023-08-25 | Disposition: A | Discharge status: Home / Self Care | Payer: 59 | Attending: Emergency Medicine | Admitting: Emergency Medicine

## 2023-08-25 ENCOUNTER — Emergency Department (HOSPITAL_BASED_OUTPATIENT_CLINIC_OR_DEPARTMENT_OTHER): Payer: 59

## 2023-08-25 DIAGNOSIS — R200 Anesthesia of skin: Secondary | ICD-10-CM | POA: Diagnosis not present

## 2023-08-25 DIAGNOSIS — I700 Atherosclerosis of aorta: Secondary | ICD-10-CM | POA: Diagnosis not present

## 2023-08-25 DIAGNOSIS — J9811 Atelectasis: Secondary | ICD-10-CM | POA: Diagnosis not present

## 2023-08-25 DIAGNOSIS — Z7984 Long term (current) use of oral hypoglycemic drugs: Secondary | ICD-10-CM | POA: Diagnosis not present

## 2023-08-25 DIAGNOSIS — M5412 Radiculopathy, cervical region: Secondary | ICD-10-CM | POA: Diagnosis not present

## 2023-08-25 DIAGNOSIS — R059 Cough, unspecified: Secondary | ICD-10-CM | POA: Diagnosis not present

## 2023-08-25 DIAGNOSIS — E119 Type 2 diabetes mellitus without complications: Secondary | ICD-10-CM | POA: Insufficient documentation

## 2023-08-25 DIAGNOSIS — Z7982 Long term (current) use of aspirin: Secondary | ICD-10-CM | POA: Insufficient documentation

## 2023-08-25 DIAGNOSIS — R202 Paresthesia of skin: Secondary | ICD-10-CM

## 2023-08-25 DIAGNOSIS — I10 Essential (primary) hypertension: Secondary | ICD-10-CM | POA: Diagnosis not present

## 2023-08-25 DIAGNOSIS — J069 Acute upper respiratory infection, unspecified: Secondary | ICD-10-CM | POA: Diagnosis not present

## 2023-08-25 LAB — CBC
HCT: 36 % (ref 36.0–46.0)
Hemoglobin: 11.3 g/dL — ABNORMAL LOW (ref 12.0–15.0)
MCH: 26.5 pg (ref 26.0–34.0)
MCHC: 31.4 g/dL (ref 30.0–36.0)
MCV: 84.5 fL (ref 80.0–100.0)
Platelets: 238 10*3/uL (ref 150–400)
RBC: 4.26 MIL/uL (ref 3.87–5.11)
RDW: 16.2 % — ABNORMAL HIGH (ref 11.5–15.5)
WBC: 6.7 10*3/uL (ref 4.0–10.5)
nRBC: 0 % (ref 0.0–0.2)

## 2023-08-25 LAB — BASIC METABOLIC PANEL
Anion gap: 3 — ABNORMAL LOW (ref 5–15)
BUN: 16 mg/dL (ref 8–23)
CO2: 34 mmol/L — ABNORMAL HIGH (ref 22–32)
Calcium: 9.2 mg/dL (ref 8.9–10.3)
Chloride: 104 mmol/L (ref 98–111)
Creatinine, Ser: 1.4 mg/dL — ABNORMAL HIGH (ref 0.44–1.00)
GFR, Estimated: 40 mL/min — ABNORMAL LOW (ref >60)
Glucose, Bld: 97 mg/dL (ref 70–99)
Potassium: 3.6 mmol/L (ref 3.5–5.1)
Sodium: 141 mmol/L (ref 135–145)

## 2023-08-25 LAB — TROPONIN I (HIGH SENSITIVITY): Troponin I (High Sensitivity): 4 ng/L (ref <18)

## 2023-08-25 LAB — CBG MONITORING, ED: Glucose-Capillary: 79 mg/dL (ref 70–99)

## 2023-08-25 MED ORDER — NAPROXEN 500 MG PO TABS: 500.0000 mg | ORAL_TABLET | Freq: Two times a day (BID) | ORAL | 0 refills | Status: AC

## 2023-08-25 NOTE — ED Provider Notes (Signed)
[facility]    CSN: [id number] Arrival date & time: 08/25/23  1436      History   Chief Complaint Chief Complaint  Patient presents with   Extremity Pain    Right arm     HPI Shacoya Burkhammer is a 73 y.o. female.   HPI 73 year old female presents with right arm numbness and pain since 12 AM.  PMH significant for CAD (s/p MI), cancer, and HTN.  Past Medical History:  Diagnosis Date   Aortic atherosclerosis (HCC) 06/10/2017   CAD (coronary artery disease)    Cancer (HCC)    Combined form of age-related cataract, both eyes 04/07/2017   Dermatochalasis of both eyelids 04/07/2017   Diabetes mellitus without complication (HCC)    GERD (gastroesophageal reflux disease)    Hypertension    Hypothyroidism    Lacunar infarction (HCC) 07/20/2018   Left renal mass 10/04/2018   1.7 cm hypoechoic mass 10/04/2018   Lumbar degenerative disc disease 03/09/2017   Myocardial infarction (HCC) 2005   Normocytic anemia 12/14/2018   Palpitations    Papillary thyroid carcinoma (HCC)    Posterior vitreous detachment, right eye 04/07/2017    Patient Active Problem List   Diagnosis Date Noted   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/05/2019   Cervicalgia 04/05/2019   Eustachian tube dysfunction, bilateral 04/05/2019   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   White matter abnormality on MRI of brain 04/05/2019   Myalgia due to statin 12/14/2018   Normocytic anemia 12/14/2018   At moderate risk for fall 10/03/2018   Bilateral renal cysts 10/03/2018   Facet arthropathy, lumbosacral 10/03/2018   Lacunar infarction (HCC) 07/20/2018   Controlled type 2 diabetes mellitus without complication, without long-term current use of insulin (HCC) 03/08/2018   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/23/2017   Overactive bladder 08/17/2017   Equinus contracture of ankle 07/27/2017   Hammer toes of both feet 07/27/2017   Dermatophytosis, nail 07/27/2017   Calcaneal spur of  both feet 07/27/2017   Aortic atherosclerosis (HCC) 06/10/2017   Combined form of age-related cataract, both eyes 04/07/2017   Dermatochalasis of both eyelids 04/07/2017   Posterior vitreous detachment, right eye 04/07/2017   Postural kyphosis of cervicothoracic region 03/09/2017   Lumbar degenerative disc disease 03/09/2017   Bilateral primary osteoarthritis of knee 12/15/2016   Allergy to honey bee venom 12/15/2016   Hypertension 02/16/2016   DM type 2 with diabetic dyslipidemia (HCC) 02/10/2016   Gastroesophageal reflux disease without esophagitis 02/10/2016   Left ventricular diastolic dysfunction, NYHA class 1 01/28/2016   Hypothyroidism, 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   Urticaria 12/30/2015   Diabetic sensorimotor polyneuropathy (HCC) 10/27/2015   Low back strain 10/27/2015   Right inguinal pain 04/21/2015   Healthcare maintenance 12/30/2014   Skin rash 09/16/2014   Spider veins of both lower extremities 09/16/2014   Thyroid cancer (HCC) 08/17/2014   Papillary thyroid carcinoma (HCC) 08/09/2014   Post-menopausal atrophic vaginitis 06/24/2014   Nodule of left lung 01/21/2014   Class 1 obesity due to excess calories with serious comorbidity and body mass index (BMI) of 30.0 to 30.9 in adult 06/12/2012   Palpitations 11/29/2011   Cutaneous horn 09/29/2011   Abnormal mammogram of right breast 08/30/2011   Arthritis of left knee 05/24/2011   CAD (coronary artery disease) 05/24/2011   History of acute myocardial infarction of inferior wall 05/24/2011   Gastro-esophageal reflux disease with esophagitis 05/24/2011   Hyperlipidemia 05/24/2011    Past Surgical History:  Procedure Laterality Date   TOTAL  THYROIDECTOMY  2016    OB History   No obstetric history on file.      Home Medications    Prior to Admission medications   Medication Sig Start Date End Date Taking? Authorizing Provider  Accu-Chek Softclix Lancets lancets Use as instructed. Accu-chek Guide 10/23/21   Christen Butter, NP  AMBULATORY NON FORMULARY MEDICATION Knee-high, medium compression, graduated compression stockings. Apply to lower extremities. [email or web address], Zippered Compression Stockings, medium circ, long length 12/15/16   Carlis Stable, PA-C  ammonium lactate (LAC-HYDRIN) 12 % lotion APPLY TOPICALLY TO THE AFFECTED AREA AS NEEDED FOR DRY SKIN 02/10/22   Christen Butter, NP  ASPIRIN PO Take 81 mg by mouth daily.    [provider]  blood glucose meter kit and supplies Dispense based on patient and insurance preference. Check daily. (FOR ICD-10 E10.9, E11.9). 10/23/21   Christen Butter, NP  dapagliflozin propanediol (FARXIGA) 5 MG TABS tablet TAKE 1 TABLET(5 MG) BY MOUTH DAILY 07/15/23   Christen Butter, NP  DICLOFENAC SODIUM EX Apply topically as needed.    [provider]  Dulaglutide (TRULICITY) 0.75 MG/0.5ML SOPN Inject 1.5 mg into the skin once a week. 10/08/22   Christen Butter, NP  esomeprazole (NEXIUM) 20 MG capsule Take 1 capsule (20 mg total) by mouth daily at 12 noon. TAKE 1 CAPSULE BY MOUTH DAILY AT 12:00 NOON 05/18/23   Christen Butter, NP  fluticasone (FLONASE) 50 MCG/ACT nasal spray Place into both nostrils as needed for allergies or rhinitis.    [provider]  glucose blood (ACCU-CHEK GUIDE) test strip USE AS INSTRUCTED TO TEST ONCE DAILY 11/25/22   Christen Butter, NP  isosorbide mononitrate (IMDUR) 60 MG 24 hr tablet TAKE 1 TABLET(60 MG) BY MOUTH DAILY 05/18/23   Lewayne Bunting, MD  levothyroxine (SYNTHROID) 75 MCG tablet TAKE 1 TABLET(75 MCG) BY MOUTH DAILY BEFORE BREAKFAST 10/25/22   Christen Butter, NP  lisinopril-hydrochlorothiazide (ZESTORETIC) 20-12.5 MG tablet TAKE 1 TABLET BY MOUTH DAILY 06/29/23   Lewayne Bunting, MD  metFORMIN (GLUCOPHAGE) 1000 MG tablet TAKE 1 TABLET(1000 MG) BY MOUTH EVERY MORNING 07/15/23   Christen Butter, NP  metoprolol succinate (TOPROL-XL) 50 MG 24 hr tablet TAKE 1 TABLET(50 MG) BY MOUTH DAILY WITH OR IMMEDIATELY FOLLOWING A MEAL 08/11/23    Crenshaw, Madolyn Frieze, MD  nitroGLYCERIN (NITROSTAT) 0.4 MG SL tablet DISSOLVE 1 TABLET UNDER THE TONGUE EVERY 5 MINUTES, AS NEEDED FOR CHEST PAIN. MAX 3 TABLETS IN 15 MINUTES 09/07/21   Lewayne Bunting, MD  rosuvastatin (CRESTOR) 40 MG tablet TAKE 1 TABLET(40 MG) BY MOUTH AT BEDTIME 08/19/23   Christen Butter, NP  triamcinolone cream (KENALOG) 0.1 %  10/21/15   [provider]  valACYclovir (VALTREX) 1000 MG tablet Take 1 tablet (1,000 mg total) by mouth 3 (three) times daily. 06/01/23   Trevor Iha, FNP  VITAMIN D PO Take by mouth.    [provider]    Family History Family History  Problem Relation Age of Onset   Hypertension Mother    Hypertension Sister    Hypertension Brother    Breast cancer Daughter    Hypertension Sister     Social History Social History   Tobacco Use   Smoking status: Former   Smokeless tobacco: Never  Advertising account planner   Vaping status: Never Used  Substance Use Topics   Alcohol use: No   Drug use: No     Allergies   Bee venom, Tape, and Tuberculin tests   Review  of Systems Review of Systems  Musculoskeletal:        Right arm pain and numbness  All other systems reviewed and are negative.    Physical Exam Triage Vital Signs ED Triage Vitals [08/25/23 1443]  Encounter Vitals Group     BP      Systolic BP Percentile      Diastolic BP Percentile      Pulse      Resp      Temp      Temp src      SpO2      Weight      Height      Head Circumference      Peak Flow      Pain Score 7     Pain Loc      Pain Education      Exclude from Growth Chart    No data found.  Updated Vital Signs BP 102/67   Pulse 75   Temp 97.7 F (36.5 C)   Resp 16   SpO2 98%    Physical Exam Vitals and nursing note reviewed.  Constitutional:      General: She is not in acute distress.    Appearance: Normal appearance. She is obese. She is not ill-appearing.  HENT:     Head: Normocephalic and atraumatic.     Mouth/Throat:     Mouth:  Mucous membranes are moist.     Pharynx: Oropharynx is clear.  Eyes:     Extraocular Movements: Extraocular movements intact.     Conjunctiva/sclera: Conjunctivae normal.     Pupils: Pupils are equal, round, and reactive to light.  Neck:     Comments: No JVD, no bruit Cardiovascular:     Rate and Rhythm: Normal rate and regular rhythm.     Pulses: Normal pulses.     Heart sounds: Normal heart sounds. No murmur heard.    No friction rub. No gallop.  Pulmonary:     Effort: Pulmonary effort is normal.     Breath sounds: Normal breath sounds. No wheezing, rhonchi or rales.  Musculoskeletal:        General: Normal range of motion.     Cervical back: Normal range of motion and neck supple.  Skin:    General: Skin is warm and dry.  Neurological:     General: No focal deficit present.     Mental Status: She is alert and oriented to person, place, and time. Mental status is at baseline.     Cranial Nerves: No cranial nerve deficit.     Sensory: No sensory deficit.     Motor: No weakness.     Coordination: Coordination normal.     Gait: Gait normal.     Deep Tendon Reflexes: Reflexes normal.     Comments: Patient complaining of numbness and tingling of right upper arm, grip/5 x 5, neurovascular intact, neurosensory intact, pulses are +2/bounding  Psychiatric:        Mood and Affect: Mood normal.        Behavior: Behavior normal.        Thought Content: Thought content normal.      UC Treatments / Results  Labs (all labs ordered are listed, but only abnormal results are displayed) Labs Reviewed - No data to display  EKG   Radiology No results found.  Procedures Procedures (including critical care time)  Medications Ordered in UC Medications - No data to display  Initial Impression / Assessment and  Plan / UC Course  I have reviewed the triage vital signs and the nursing notes.  Pertinent labs & imaging results that were available during my care of the patient were  reviewed by me and considered in my medical decision making (see chart for details).     MDM: 1.  Numbness and tingling of right arm-Patient to go to [facility] now for further evaluation of numbness and tingling of right arm.  Patient agreed and verbalized understanding of these instructions and this plan of care today.  Patient discharged to ED, hemodynamically stable. Final Clinical Impressions(s) / UC Diagnoses   Final diagnoses:  Numbness and tingling of right arm     Discharge Instructions      Patient to go to [facility] now for further evaluation of numbness and tingling of right arm.     ED Prescriptions   None    PDMP not reviewed this encounter.   Trevor Iha, FNP 08/25/23 [phone]

## 2023-08-25 NOTE — ED Provider Notes (Signed)
[facility] Provider Note   CSN: [id number] Arrival date & time: 08/25/23  1540     History  Chief Complaint  Patient presents with   Numbness    Sara Gardner is a 73 y.o. female.  HPI   This patient is a 73 year old female, she has a history of diabetes, she takes [place], she is on antihypertensive medications and reports that she has a history of a prior heart blockage.  She reports that last night starting at about midnight she was awakened from sleep with a sharp and shooting burning pain going down her right arm to the right fourth and fifth fingers.  She denies having any neck pain that she recalls at the time, she denies fevers or chills, no nausea vomiting or diarrhea, no shortness of breath.  She had some resultant palpitations that occurred after this occurred.  She reports now that she has no symptoms whatsoever and in fact feels no numbness, no tingling, no weakness, no pain.  She reports that she has never had this in the past however she then tells me that she thinks that her neighbors have put something in her heating and air conditioning units because every time she stays at home she develops strange pains and when she is away from the house she does not have the same symptoms.  She has never had any neck issues, neck surgery or radiculopathies, she is not having any chest pain or palpitations at this time.  Home Medications Prior to Admission medications   Medication Sig Start Date End Date Taking? Authorizing Provider  naproxen (NAPROSYN) 500 MG tablet Take 1 tablet (500 mg total) by mouth 2 (two) times daily with a meal. 08/25/23  Yes Eber Hong, MD  Accu-Chek Softclix Lancets lancets Use as instructed. Accu-chek Guide 10/23/21   Christen Butter, NP  AMBULATORY NON FORMULARY MEDICATION Knee-high, medium compression, graduated compression stockings. Apply to lower extremities. [email or web address], Zippered Compression  Stockings, medium circ, long length 12/15/16   Carlis Stable, PA-C  ammonium lactate (LAC-HYDRIN) 12 % lotion APPLY TOPICALLY TO THE AFFECTED AREA AS NEEDED FOR DRY SKIN 02/10/22   Christen Butter, NP  ASPIRIN PO Take 81 mg by mouth daily.    [provider]  blood glucose meter kit and supplies Dispense based on patient and insurance preference. Check daily. (FOR ICD-10 E10.9, E11.9). 10/23/21   Christen Butter, NP  dapagliflozin propanediol (FARXIGA) 5 MG TABS tablet TAKE 1 TABLET(5 MG) BY MOUTH DAILY 07/15/23   Christen Butter, NP  DICLOFENAC SODIUM EX Apply topically as needed.    [provider]  Dulaglutide (TRULICITY) 0.75 MG/0.5ML SOPN Inject 1.5 mg into the skin once a week. 10/08/22   Christen Butter, NP  esomeprazole (NEXIUM) 20 MG capsule Take 1 capsule (20 mg total) by mouth daily at 12 noon. TAKE 1 CAPSULE BY MOUTH DAILY AT 12:00 NOON 05/18/23   Christen Butter, NP  fluticasone (FLONASE) 50 MCG/ACT nasal spray Place into both nostrils as needed for allergies or rhinitis.    [provider]  glucose blood (ACCU-CHEK GUIDE) test strip USE AS INSTRUCTED TO TEST ONCE DAILY 11/25/22   Christen Butter, NP  isosorbide mononitrate (IMDUR) 60 MG 24 hr tablet TAKE 1 TABLET(60 MG) BY MOUTH DAILY 05/18/23   Lewayne Bunting, MD  levothyroxine (SYNTHROID) 75 MCG tablet TAKE 1 TABLET(75 MCG) BY MOUTH DAILY BEFORE BREAKFAST 10/25/22   Christen Butter, NP  lisinopril-hydrochlorothiazide (ZESTORETIC) 20-12.5 MG tablet TAKE  1 TABLET BY MOUTH DAILY 06/29/23   Lewayne Bunting, MD  metFORMIN (GLUCOPHAGE) 1000 MG tablet TAKE 1 TABLET(1000 MG) BY MOUTH EVERY MORNING 07/15/23   Christen Butter, NP  metoprolol succinate (TOPROL-XL) 50 MG 24 hr tablet TAKE 1 TABLET(50 MG) BY MOUTH DAILY WITH OR IMMEDIATELY FOLLOWING A MEAL 08/11/23   Crenshaw, Madolyn Frieze, MD  nitroGLYCERIN (NITROSTAT) 0.4 MG SL tablet DISSOLVE 1 TABLET UNDER THE TONGUE EVERY 5 MINUTES, AS NEEDED FOR CHEST PAIN. MAX 3 TABLETS IN 15 MINUTES 09/07/21    Lewayne Bunting, MD  rosuvastatin (CRESTOR) 40 MG tablet TAKE 1 TABLET(40 MG) BY MOUTH AT BEDTIME 08/19/23   Christen Butter, NP  triamcinolone cream (KENALOG) 0.1 %  10/21/15   [provider]  valACYclovir (VALTREX) 1000 MG tablet Take 1 tablet (1,000 mg total) by mouth 3 (three) times daily. 06/01/23   Trevor Iha, FNP  VITAMIN D PO Take by mouth.    [provider]      Allergies    Bee venom, Tape, and Tuberculin tests    Review of Systems   Review of Systems  All other systems reviewed and are negative.   Physical Exam Updated Vital Signs BP 134/62   Pulse 86   Temp 98.2 F (36.8 C) (Oral)   Resp 17   SpO2 98%  Physical Exam Vitals and nursing note reviewed.  Constitutional:      General: She is not in acute distress.    Appearance: She is well-developed.  HENT:     Head: Normocephalic and atraumatic.     Mouth/Throat:     Pharynx: No oropharyngeal exudate.  Eyes:     General: No scleral icterus.       Right eye: No discharge.        Left eye: No discharge.     Conjunctiva/sclera: Conjunctivae normal.     Pupils: Pupils are equal, round, and reactive to light.  Neck:     Thyroid: No thyromegaly.     Vascular: No JVD.  Cardiovascular:     Rate and Rhythm: Normal rate and regular rhythm.     Heart sounds: Normal heart sounds. No murmur heard.    No friction rub. No gallop.  Pulmonary:     Effort: Pulmonary effort is normal. No respiratory distress.     Breath sounds: Normal breath sounds. No wheezing or rales.  Abdominal:     General: Bowel sounds are normal. There is no distension.     Palpations: Abdomen is soft. There is no mass.     Tenderness: There is no abdominal tenderness.  Musculoskeletal:        General: No tenderness. Normal range of motion.     Cervical back: Normal range of motion and neck supple.  Lymphadenopathy:     Cervical: No cervical adenopathy.  Skin:    General: Skin is warm and dry.     Findings: No erythema or  rash.  Neurological:     Mental Status: She is alert.     Coordination: Coordination normal.     Comments: Speech is clear, cranial nerves III through XII are intact, memory is intact, strength is normal in all 4 extremities including grips and strength at the bilateral thighs, knees and ankles to extention and flexion, sensation is intact to light touch and pinprick in all 4 extremities. Coordination as tested by finger-nose-finger is normal, no limb ataxia. Normal gait, normal reflexes at the patellar tendons bilaterally  Psychiatric:  Behavior: Behavior normal.     ED Results / Procedures / Treatments   Labs (all labs ordered are listed, but only abnormal results are displayed) Labs Reviewed  CBC - Abnormal; Notable for the following components:      Result Value   Hemoglobin 11.3 (*)    RDW 16.2 (*)    All other components within normal limits  BASIC METABOLIC PANEL - Abnormal; Notable for the following components:   CO2 34 (*)    Creatinine, Ser 1.40 (*)    GFR, Estimated 40 (*)    Anion gap 3 (*)    All other components within normal limits  CBG MONITORING, ED  TROPONIN I (HIGH SENSITIVITY)    EKG EKG Interpretation Date/Time:  Thursday August 25 2023 15:59:23 EST Ventricular Rate:  75 PR Interval:  163 QRS Duration:  85 QT Interval:  400 QTC Calculation: 447 R Axis:   11  Text Interpretation: Sinus rhythm Low voltage, precordial leads since last tracing no significant change Confirmed by Eber Hong ([id number]) on 08/25/2023 4:02:29 PM  Radiology DG Chest Port 1 View Result Date: 08/25/2023 CLINICAL DATA:  cough, after URI a month ago EXAM: PORTABLE CHEST 1 VIEW COMPARISON:  Chest x-ray 09/06/2021 FINDINGS: The heart and mediastinal contours are within normal limits. Atherosclerotic plaque. Left base atelectasis. No focal consolidation. No pulmonary edema. No pleural effusion. No pneumothorax. No acute osseous abnormality. IMPRESSION: 1. No active disease. 2.   Aortic Atherosclerosis (ICD10-I70.0). Electronically Signed   By: Tish Frederickson M.D.   On: 08/25/2023 17:03    Procedures Procedures    Medications Ordered in ED Medications - No data to display  ED Course/ Medical Decision Making/ A&P Clinical Course as of 08/25/23 1725  Thu Aug 25, 2023  1723 Labs reviewed, troponin negative, CBC unremarkable, metabolic panel shows mild chronic renal insufficiency but no other acute findings  [BM]  1723 I personally viewed and interpreted the x-ray of the chest which shows no signs of pneumonia or other abnormal findings and I agree with radiologist interpretation [BM]    Clinical Course User Index [BM] Eber Hong, MD                                 Medical Decision Making Amount and/or Complexity of Data Reviewed Labs: ordered. Radiology: ordered. ECG/medicine tests: ordered.  Risk Prescription drug management.    This patient presents to the ED for concern of focal right arm pain, seems to be radicular in nature, differential diagnosis includes cervical radiculopathy, peripheral neuropathy, seems much less likely to be stroke, she did have palpitations afterwards so we will check EKG and troponin as well as a chest x-ray    Additional history obtained:  Additional history obtained from medical record External records from outside source obtained and reviewed including diabetes, hypertension   Lab Tests:  I Ordered, and personally interpreted labs.  The pertinent results include: As above negative troponin negative CBC negative metabolic panel, negative troponin   Imaging Studies ordered:  I ordered imaging studies including unremarkable chest x-ray I independently visualized and interpreted imaging which showed no acute findings I agree with the radiologist interpretation   Medicines ordered and prescription drug management:  Stable, with Naprosyn for home I have reviewed the patients home medicines and have made  adjustments as needed   Problem List / ED Course:  No recurrent symptoms in the ED, the patient is well-appearing, vital  signs unremarkable, stable for discharge   Social Determinants of Health:  None           Final Clinical Impression(s) / ED Diagnoses Final diagnoses:  Cervical radiculopathy    Rx / DC Orders ED Discharge Orders          Ordered    naproxen (NAPROSYN) 500 MG tablet  2 times daily with meals        08/25/23 1724              Eber Hong, MD 08/25/23 1725

## 2023-08-25 NOTE — ED Triage Notes (Signed)
Pt presents with complaints of numbness in her fingers and right arm that started last night. Pt reports that symptoms have resolved pta

## 2023-08-25 NOTE — Discharge Instructions (Addendum)
Patient to go to [facility] ED now for further evaluation of numbness and tingling of right arm.

## 2023-08-25 NOTE — Discharge Instructions (Signed)
Thankfully your testing was unremarkable and shows no signs of acute abnormalities.  No signs of heart attack, no signs of pneumonia, your blood work was reassuring.  I do want you to see your doctor in follow-up and until that time you may take Naprosyn twice a day to help with some of the pain.  If things get worse with weakness numbness or any worsening symptoms return to the ER immediately

## 2023-08-25 NOTE — ED Notes (Signed)
Patient is being discharged from the Urgent Care and sent to the Emergency Department via POV. Per Trevor Iha, FNP, patient is in need of higher level of care due to extremity pain and need for stat labs/advanced imaging. Patient is aware and verbalizes understanding of plan of care.  Vitals:   08/25/23 1444  BP: 102/67  Pulse: 75  Resp: 16  Temp: 97.7 F (36.5 C)  SpO2: 98%

## 2023-08-25 NOTE — ED Notes (Signed)
Pt. Reports she has pain in her R shoulder and R arm.  Pt. Able to move the R arm and R hand WNL.  Pt. Playing games on her phone with R hand and able to do Neuro scale with R arm and R hand according.  Pt. In no distress.

## 2023-08-25 NOTE — ED Triage Notes (Addendum)
Pt presents to uc with co of of right arm numbness and pain  and facial pain and palpitations since last night at 12 am. Pt reports she got up and took a shower and went back to sleep. Pt reports she did taken Asprin at 12 and then again this morning.

## 2023-08-30 NOTE — Progress Notes (Signed)
 HPI: FU CAD. Previously followed by Dr. Lanell Matar. Apparently had myocardial infarction in 2005; cardiac catheterization revealed occluded infarct vessel which was treated medically. Not all records available. Echocardiogram July 2017 showed normal LV function, grade 1 diastolic dysfunction left atrial enlargement. Carotid Dopplers January 2020 showed no significant stenosis. Nuclear study February 2023 showed ejection fraction 61% and no ischemia.  Monitor March 2023 showed sinus rhythm with occasional PAC, brief episodes of PAT and rare PVC.  Dobutamine echocardiogram June 2023 at [facility] showed normal LV function and no ischemia.  Lower extremity venous Doppler September 2024 showed no DVT.  Since last seen, she denies dyspnea on exertion, orthopnea, PND, pedal edema, exertional chest pain or syncope.  Current Outpatient Medications  Medication Sig Dispense Refill   Accu-Chek Softclix Lancets lancets Use as instructed. Accu-chek Guide 100 each 12   AMBULATORY NON FORMULARY MEDICATION Knee-high, medium compression, graduated compression stockings. Apply to lower extremities. [email or web address], Zippered Compression Stockings, medium circ, long length 1 each 0   ammonium lactate (LAC-HYDRIN) 12 % lotion APPLY TOPICALLY TO THE AFFECTED AREA AS NEEDED FOR DRY SKIN 400 g 0   ASPIRIN PO Take 81 mg by mouth daily.     blood glucose meter kit and supplies Dispense based on patient and insurance preference. Check daily. (FOR ICD-10 E10.9, E11.9). 1 each 0   dapagliflozin propanediol (FARXIGA) 5 MG TABS tablet TAKE 1 TABLET(5 MG) BY MOUTH DAILY 90 tablet 1   DICLOFENAC SODIUM EX Apply topically as needed.     Dulaglutide (TRULICITY) 0.75 MG/0.5ML SOPN Inject 1.5 mg into the skin once a week. 12 mL 1   esomeprazole (NEXIUM) 20 MG capsule Take 1 capsule (20 mg total) by mouth daily at 12 noon. TAKE 1 CAPSULE BY MOUTH DAILY AT 12:00 NOON 90 capsule 1   fluticasone (FLONASE) 50 MCG/ACT nasal spray  Place into both nostrils as needed for allergies or rhinitis.     glucose blood (ACCU-CHEK GUIDE) test strip USE AS INSTRUCTED TO TEST ONCE DAILY 100 strip 1   isosorbide mononitrate (IMDUR) 60 MG 24 hr tablet TAKE 1 TABLET(60 MG) BY MOUTH DAILY 90 tablet 3   levothyroxine (SYNTHROID) 88 MCG tablet Take 88 mcg by mouth daily before breakfast.     lisinopril-hydrochlorothiazide (ZESTORETIC) 20-12.5 MG tablet TAKE 1 TABLET BY MOUTH DAILY 90 tablet 3   metFORMIN (GLUCOPHAGE) 1000 MG tablet TAKE 1 TABLET(1000 MG) BY MOUTH EVERY MORNING 90 tablet 1   metoprolol succinate (TOPROL-XL) 50 MG 24 hr tablet TAKE 1 TABLET(50 MG) BY MOUTH DAILY WITH OR IMMEDIATELY FOLLOWING A MEAL 90 tablet 2   rosuvastatin (CRESTOR) 40 MG tablet TAKE 1 TABLET(40 MG) BY MOUTH AT BEDTIME 90 tablet 1   triamcinolone cream (KENALOG) 0.1 % Apply topically 2 (two) times daily as needed. 30 g 0   VITAMIN D PO Take by mouth.     naproxen (NAPROSYN) 500 MG tablet Take 1 tablet (500 mg total) by mouth 2 (two) times daily with a meal. (Patient not taking: Reported on 09/13/2023) 30 tablet 0   nitroGLYCERIN (NITROSTAT) 0.4 MG SL tablet DISSOLVE 1 TABLET UNDER THE TONGUE EVERY 5 MINUTES, AS NEEDED FOR CHEST PAIN. MAX 3 TABLETS IN 15 MINUTES (Patient not taking: Reported on 09/13/2023) 25 tablet 6   valACYclovir (VALTREX) 1000 MG tablet Take 1 tablet (1,000 mg total) by mouth 3 (three) times daily. (Patient not taking: Reported on 09/13/2023) 21 tablet 0   No current facility-administered medications for this visit.  Past Medical History:  Diagnosis Date   Aortic atherosclerosis (HCC) 06/10/2017   CAD (coronary artery disease)    Cancer (HCC)    Combined form of age-related cataract, both eyes 04/07/2017   Dermatochalasis of both eyelids 04/07/2017   Diabetes mellitus without complication (HCC)    GERD (gastroesophageal reflux disease)    Hypertension    Hypothyroidism    Lacunar infarction (HCC) 07/20/2018   Left renal mass  10/04/2018   1.7 cm hypoechoic mass 10/04/2018   Lumbar degenerative disc disease 03/09/2017   Myocardial infarction (HCC) 2005   Normocytic anemia 12/14/2018   Palpitations    Papillary thyroid carcinoma (HCC)    Posterior vitreous detachment, right eye 04/07/2017    Past Surgical History:  Procedure Laterality Date   TOTAL THYROIDECTOMY  2016    Social History   Socioeconomic History   Marital status: Divorced    Spouse name: Not on file   Number of children: 1   Years of education: 16   Highest education level: Bachelor's degree (e.g., BA, AB, BS)  Occupational History   Occupation: Retired.  Tobacco Use   Smoking status: Former    Passive exposure: Never   Smokeless tobacco: Never  Vaping Use   Vaping status: Never Used  Substance and Sexual Activity   Alcohol use: No   Drug use: No   Sexual activity: Not Currently  Other Topics Concern   Not on file  Social History Narrative   Lives alone. She has one daughter who lives close by. She enjoys dancing, playing cards and travel.    Social Drivers of Health   Financial Resource Strain: Medium Risk (08/04/2023)   Received from [facility]   Overall Financial Resource Strain (CARDIA)    Difficulty of Paying Living Expenses: Somewhat hard  Food Insecurity: No Food Insecurity (08/04/2023)   Received from [facility]   Hunger Vital Sign    Worried About Running Out of Food in the Last Year: Never true    Ran Out of Food in the Last Year: Never true  Transportation Needs: No Transportation Needs (08/04/2023)   Received from [facility] - Transportation    Lack of Transportation (Medical): No    Lack of Transportation (Non-Medical): No  Physical Activity: Not on File (08/03/2019)   Received from [place], [place]   Physical Activity    Physical Activity: 0  Stress: No Stress Concern Present (09/24/2022)   [employer] of Occupational Health - Occupational Stress Questionnaire    Feeling of Stress : Only a  little  Social Connections: Moderately Integrated (09/24/2022)   Social Connection and Isolation Panel [NHANES]    Frequency of Communication with Friends and Family: More than three times a week    Frequency of Social Gatherings with Friends and Family: Three times a week    Attends Religious Services: More than 4 times per year    Active Member of Clubs or Organizations: Yes    Attends Banker Meetings: More than 4 times per year    Marital Status: Divorced  Intimate Partner Violence: Not At Risk (09/24/2022)   Humiliation, Afraid, Rape, and Kick questionnaire    Fear of Current or Ex-Partner: No    Emotionally Abused: No    Physically Abused: No    Sexually Abused: No    Family History  Problem Relation Age of Onset   Hypertension Mother    Hypertension Sister    Hypertension Brother    Breast cancer  Daughter    Hypertension Sister     ROS: no fevers or chills, productive cough, hemoptysis, dysphasia, odynophagia, melena, hematochezia, dysuria, hematuria, rash, seizure activity, orthopnea, PND, pedal edema, claudication. Remaining systems are negative.  Physical Exam: Well-developed well-nourished in no acute distress.  Skin is warm and dry.  HEENT is normal.  Neck is supple.  Chest is clear to auscultation with normal expansion.  Cardiovascular exam is regular rate and rhythm.  Abdominal exam nontender or distended. No masses palpated. Extremities show no edema. neuro grossly intact  ECG-August 29, 2023-normal sinus rhythm with nonspecific ST changes.  Personally reviewed  A/P  1 coronary artery disease-patient denies exertional chest pain.  Continue aspirin and statin.  2 hyperlipidemia-continue statin.  Check lipids and liver.  3 hypertension-patient's blood pressure is controlled. Continue present meds.  4 palpitations-patient denies any recent symptoms.  Olga Millers, MD

## 2023-08-31 ENCOUNTER — Encounter: Payer: Self-pay | Admitting: Physical Therapy

## 2023-08-31 ENCOUNTER — Ambulatory Visit: Payer: 59 | Attending: Medical-Surgical | Admitting: Physical Therapy

## 2023-08-31 DIAGNOSIS — M25561 Pain in right knee: Secondary | ICD-10-CM | POA: Diagnosis not present

## 2023-08-31 DIAGNOSIS — M25562 Pain in left knee: Secondary | ICD-10-CM | POA: Insufficient documentation

## 2023-08-31 DIAGNOSIS — M6281 Muscle weakness (generalized): Secondary | ICD-10-CM | POA: Diagnosis not present

## 2023-08-31 DIAGNOSIS — G8929 Other chronic pain: Secondary | ICD-10-CM | POA: Insufficient documentation

## 2023-08-31 NOTE — Therapy (Signed)
 OUTPATIENT PHYSICAL THERAPY TREATMENT    Patient Name: Mahogany Torrance MRN: [id number] DOB:[birth year], 73 y.o., female Today's Date: 08/31/2023 Dates of service: 05/18/23-08/03/23 END OF SESSION:  PT End of Session - 08/31/23 0924     Visit Number 14    Number of Visits 16    Date for PT Re-Evaluation 09/14/23    Authorization Type [employer] Medicare    Authorization - Visit Number 14    Progress Note Due on Visit 20    PT Start Time 0845    PT Stop Time 0923    PT Time Calculation (min) 38 min    Activity Tolerance Patient tolerated treatment well    Behavior During Therapy [facility] for tasks assessed/performed                         Past Medical History:  Diagnosis Date   Aortic atherosclerosis (HCC) 06/10/2017   CAD (coronary artery disease)    Cancer (HCC)    Combined form of age-related cataract, both eyes 04/07/2017   Dermatochalasis of both eyelids 04/07/2017   Diabetes mellitus without complication (HCC)    GERD (gastroesophageal reflux disease)    Hypertension    Hypothyroidism    Lacunar infarction (HCC) 07/20/2018   Left renal mass 10/04/2018   1.7 cm hypoechoic mass 10/04/2018   Lumbar degenerative disc disease 03/09/2017   Myocardial infarction (HCC) 2005   Normocytic anemia 12/14/2018   Palpitations    Papillary thyroid  carcinoma (HCC)    Posterior vitreous detachment, right eye 04/07/2017   Past Surgical History:  Procedure Laterality Date   TOTAL THYROIDECTOMY  2016   Patient Active Problem List   Diagnosis Date Noted   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/05/2019   Cervicalgia 04/05/2019   Eustachian tube dysfunction, bilateral 04/05/2019   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   White matter abnormality on MRI of brain 04/05/2019   Myalgia due to statin 12/14/2018   Normocytic anemia 12/14/2018   At moderate risk for fall 10/03/2018   Bilateral renal cysts 10/03/2018   Facet arthropathy, lumbosacral 10/03/2018   Lacunar  infarction (HCC) 07/20/2018   Controlled type 2 diabetes mellitus without complication, without long-term current use of insulin  (HCC) 03/08/2018   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/23/2017   Overactive bladder 08/17/2017   Equinus contracture of ankle 07/27/2017   Hammer toes of both feet 07/27/2017   Dermatophytosis, nail 07/27/2017   Calcaneal spur of both feet 07/27/2017   Aortic atherosclerosis (HCC) 06/10/2017   Combined form of age-related cataract, both eyes 04/07/2017   Dermatochalasis of both eyelids 04/07/2017   Posterior vitreous detachment, right eye 04/07/2017   Postural kyphosis of cervicothoracic region 03/09/2017   Lumbar degenerative disc disease 03/09/2017   Bilateral primary osteoarthritis of knee 12/15/2016   Allergy to honey bee venom 12/15/2016   Hypertension 02/16/2016   DM type 2 with diabetic dyslipidemia (HCC) 02/10/2016   Gastroesophageal reflux disease without esophagitis 02/10/2016   Left ventricular diastolic dysfunction, NYHA class 1 01/28/2016   Hypothyroidism, 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   Urticaria 12/30/2015   Diabetic sensorimotor polyneuropathy (HCC) 10/27/2015   Low back strain 10/27/2015   Right inguinal pain 04/21/2015   Healthcare maintenance 12/30/2014   Skin rash 09/16/2014   Spider veins of both lower extremities 09/16/2014   Thyroid  cancer (HCC) 08/17/2014   Papillary thyroid  carcinoma (HCC) 08/09/2014   Post-menopausal atrophic vaginitis 06/24/2014   Nodule of left lung 01/21/2014   Class 1 obesity due to excess  calories with serious comorbidity and body mass index (BMI) of 30.0 to 30.9 in adult 06/12/2012   Palpitations 11/29/2011   Cutaneous horn 09/29/2011   Abnormal mammogram of right breast 08/30/2011   Arthritis of left knee 05/24/2011   CAD (coronary artery disease) 05/24/2011   History of acute myocardial infarction of inferior wall 05/24/2011   Gastro-esophageal reflux disease with esophagitis 05/24/2011    Hyperlipidemia 05/24/2011    PCP: Willo Mini  REFERRING PROVIDER: Willo Mini  REFERRING DIAG: pain in bilateral lower extremities  THERAPY DIAG:  Chronic pain of both knees  Muscle weakness (generalized)  Rationale for Evaluation and Treatment: Rehabilitation  ONSET DATE: 03/2023  SUBJECTIVE:   SUBJECTIVE STATEMENT: Pt states her Lt knee is really bothering her, she feels like she is hobbling today  PERTINENT HISTORY: CAD, DM  Pt states that a month ago her legs swelled up and began burning. She states she has had difficulty with stairs and has even had to walk with a cane due to pain. She states she has pain in both knees, as well as burning feeling but only when she is at home. She states that she has had to be more cautious with walking due to increased fear of falling due to pain. PAIN:  Are you having pain? Yes: NPRS scale: 7/10 Pain location: bilateral knees Pain description: sore/achey Aggravating factors: stairs, walking Relieving factors: rest, voltaren   PRECAUTIONS: None  RED FLAGS: None   WEIGHT BEARING RESTRICTIONS: No  FALLS:  Has patient fallen in last 6 months? No  LIVING ENVIRONMENT: Lives with: lives alone Lives in: House/apartment Stairs: Yes: External: 5 steps; bilateral but cannot reach both Has following equipment at home: Single point cane  OCCUPATION: just retired from [employer], active with her church  PLOF: Independent  PATIENT GOALS: decrease pain  NEXT MD VISIT: PRN  OBJECTIVE:  Note: Objective measures were completed at Evaluation unless otherwise noted.  DIAGNOSTIC FINDINGS: Ultrasound: negative for DVT  COGNITION: Overall cognitive status: Within functional limits for tasks assessed     SENSATION: Pt c/o burning in bilateral LEs   MUSCLE LENGTH: Hamstrings: Right 95 deg; Left 95 deg   PALPATION: TTP bilat lateral, medial and posterior knees Patella mobility WFL  LOWER EXTREMITY ROM:  AROM ROM  Right eval Left eval 06/08/23  Hip flexion     Hip extension     Hip abduction     Hip adduction     Hip internal rotation     Hip external rotation     Knee flexion 109 110 Lt: 112; Rt:113   Knee extension 0 0   Ankle dorsiflexion     Ankle plantarflexion     Ankle inversion     Ankle eversion      (Blank rows = not tested)  LOWER EXTREMITY MMT:  MMT Right eval Left eval  Hip flexion 4- 4  Hip extension 3+ 3+  Hip abduction 3+ 3+  Hip adduction    Hip internal rotation    Hip external rotation    Knee flexion 4- 4-  Knee extension 4 4  Ankle dorsiflexion    Ankle plantarflexion    Ankle inversion    Ankle eversion     (Blank rows = not tested)  LOWER EXTREMITY SPECIAL TESTS:  Ober's negative bilat  FUNCTIONAL TESTS:  5 times sit to stand: 30.10 seconds  (eval), 16.14 seconds (11/22) SLS: unable due to pain  GAIT: Stairs: pt leads with Rt LE due to Lt  knee pain. Needs bilat handrails. Step to pattern   TODAY'S TREATMENT:      [facility] Adult PT Treatment:                                                DATE: 08/31/23 Therapeutic Exercise: Nustep L6 x 5 min for warm up SLR 2 x 10 bilat Bridge with clam green TB 2 x 10 Sidelying clam green TB 2 x 10 Sidelying hip abd 2 x 10 bilat LAQ 2 x 12 bilat 2# HS curl green TB 2 x 12 bilat  Therapeutic Activity: Education on pain management and activity modification   [facility] Adult PT Treatment:                                                DATE: 08/24/23 Therapeutic Exercise: Hip abd green TB x 20 Hip ext green TB x 20 HS curl green TB x 20 Step ups 4'' step 2 x 10 bilat Lateral step ups x 10 bilat 4'' STS x 10  Modalities: TENS Lt knee x 10 min to tolerance  [facility] Adult PT Treatment:                                                DATE: 08/17/23 Therapeutic Exercise: Nustep L6 x 5 min Hip abd red TB x 20 Hip ext red TB x 20 HS curl red TB x 20 Step up 4'' step x 10 bilat Lateral step up 4'' step x 10 bilat LAQ  4# Rt, 2# Lt 2 x 20 HS curl green TB x 20 bilat Bridge x 20 SLR 2 x 10 bilat Sidelying clam x 20 bilat Supine hip abd green TB x 20 STS x 10   [facility] Adult PT Treatment:                                                DATE: 08/12/23 Therapeutic Exercise: LAQ 4# Rt, 2# Lt 2 x 15 SLR Rt LE 2#  2 x 10, Lt LE 2 x 10 Step up 2'' step forward and laterally Lt LE 2 x 10 Tap up 8'' step Lt LE x 20 Semi tandem stance on foam 2 x 30 seconds Side step red TB - UE support Backward walking red TB - UE support Bridge 2 x 10 Clam 2 x 10 bilat   PATIENT EDUCATION:  Education details: Updated HEP Person educated: Patient Education method: Explanation Education comprehension: verbalized understanding  HOME EXERCISE PROGRAM: Access Code: [email or web address] URL: [email or web address] Date: 06/29/2023 Prepared by: Lamarr Price  Exercises - Seated Calf Stretch with Strap  - 1 x daily - 7 x weekly - 1 sets - 3 reps - 20-30 seconds hold - Seated Hamstring Stretch  - 1 x daily - 7 x weekly - 1 sets - 3 reps - 20-30 seconds hold - Sit to Stand with Armchair  - 1 x daily - 7 x weekly - 2 sets -  10 reps - Supine Short Arc Quad  - 1 x daily - 7 x weekly - 3 sets - 10 reps - Seated Long Arc Quad  - 1 x daily - 7 x weekly - 3 sets - 10 reps - Heel Raises with Counter Support  - 1 x daily - 7 x weekly - 3 sets - 10 reps - Side Stepping with Resistance at Thighs and Counter Support  - 1 x daily - 7 x weekly - 3 sets - 10 reps  ASSESSMENT:  CLINICAL IMPRESSION: More NWB exercises performed today due to increased Lt knee pain. Pt with good tolerance to activities during session, required more frequent rest breaks on Lt LE due to increased knee pain   OBJECTIVE IMPAIRMENTS: decreased activity tolerance, decreased balance, difficulty walking, decreased ROM, decreased strength, and pain.     GOALS: Goals reviewed with patient? Yes  SHORT TERM GOALS: Target date: 06/15/2023  Pt will be  independent with initial HEP Baseline: Goal status: MET  2.  Pt will demo improved strength with 5 x STS <= 25 seconds Baseline:  Goal status: MET   LONG TERM GOALS: Target date: 09/14/2023   Pt will be independent in advanced HEP Baseline:  Goal status: INITIAL  2.  Pt will demo improved strength with 5 x STS <= 20 seconds Baseline:  Goal status:MET  3.  Pt will negotiate 1 flight of stairs without handrail with reciprocal pattern Baseline:  Goal status: IN PROGRESS  4.  Pt will tolerate SLS x 10 seconds bilat LE to improve balance and reduce risk of falls Baseline:  Goal status: IN PROGRESS    PLAN:  PT FREQUENCY: 1x/week  PT DURATION: 6 weeks  PLANNED INTERVENTIONS: 97164- PT Re-evaluation, 97110-Therapeutic exercises, 97530- Therapeutic activity, [id number]- Neuromuscular re-education, 97535- Self Care, [id number]- Manual therapy, [id number]- Aquatic Therapy, 97014- Electrical stimulation (unattended), 97016- Vasopneumatic device, 97033- Ionotophoresis 4mg /ml Dexamethasone, Patient/Family education, Balance training, Taping, Dry Needling, Cryotherapy, and Moist heat  PLAN FOR NEXT SESSION: work on stairs/step ups,Progress knee and hip strength, increase resistance as tolerated   Darice Conine, PT,DPT02/05/259:26 AM

## 2023-09-02 ENCOUNTER — Ambulatory Visit
Admission: RE | Admit: 2023-09-02 | Discharge: 2023-09-02 | Disposition: A | Discharge status: Home / Self Care | Payer: 59 | Source: Ambulatory Visit | Attending: Medical-Surgical | Admitting: Medical-Surgical

## 2023-09-02 ENCOUNTER — Ambulatory Visit: Payer: 59

## 2023-09-02 DIAGNOSIS — Z1231 Encounter for screening mammogram for malignant neoplasm of breast: Secondary | ICD-10-CM

## 2023-09-05 ENCOUNTER — Ambulatory Visit (INDEPENDENT_AMBULATORY_CARE_PROVIDER_SITE_OTHER): Payer: 59 | Admitting: Family Medicine

## 2023-09-05 ENCOUNTER — Encounter: Payer: Self-pay | Admitting: Family Medicine

## 2023-09-05 VITALS — BP 92/58 | HR 70 | Temp 97.9°F | Resp 18 | Ht 63.0 in | Wt 162.8 lb

## 2023-09-05 DIAGNOSIS — Z23 Encounter for immunization: Secondary | ICD-10-CM | POA: Diagnosis not present

## 2023-09-05 DIAGNOSIS — N281 Cyst of kidney, acquired: Secondary | ICD-10-CM | POA: Diagnosis not present

## 2023-09-05 DIAGNOSIS — Z7689 Persons encountering health services in other specified circumstances: Secondary | ICD-10-CM | POA: Diagnosis not present

## 2023-09-05 DIAGNOSIS — E119 Type 2 diabetes mellitus without complications: Secondary | ICD-10-CM | POA: Diagnosis not present

## 2023-09-05 DIAGNOSIS — Z7984 Long term (current) use of oral hypoglycemic drugs: Secondary | ICD-10-CM

## 2023-09-05 DIAGNOSIS — L2082 Flexural eczema: Secondary | ICD-10-CM

## 2023-09-05 DIAGNOSIS — E890 Postprocedural hypothyroidism: Secondary | ICD-10-CM | POA: Diagnosis not present

## 2023-09-05 MED ORDER — TRIAMCINOLONE ACETONIDE 0.1 % EX CREA: TOPICAL_CREAM | Freq: Two times a day (BID) | CUTANEOUS | 0 refills | Status: AC | PRN

## 2023-09-05 NOTE — Progress Notes (Signed)
 New Patient Office Visit  Subjective    Patient ID: Sara Gardner, female    DOB: 04-17-1951  Age: 73 y.o. MRN: [id number]  CC:  Chief Complaint  Patient presents with   Establish Care    Patient is here to establish care with a new PCP, She states that she has concerns with a spot on her left side of her neck that itches, she also states that she has had prior kidney issues and she states that she has been experiencing pain on her left lower back near kidney    HPI Sara Gardner presents to establish care. Pt is new to me.  Pt reports she's had an area on her neck that has been present for the last 2 months. He reports it's itchy daily and changing in colors.  She also reports she has been concerned with her kidneys. She has had a pain on her left side near her kidney. The pain came on and off for the last few weeks. She has hx of bilateral kidney cysts and was worried if they have enlarged. No dysuria or hematuria. The pain is stabbing.  She is taking Farxiga  5mg , Trulicity  1.5 mg weekly, and Metformin  1000mg  daily for DM. Last A1c at 6.3 in December 2025.  She has endocrinologist and taking Synthroid  75 mcg daily. She has hx of thyroidectomy due to thyroid  cancer in 2016. They are managing and following her levels. Pt would like covid vaccine booster today.  Outpatient Encounter Medications as of 09/05/2023  Medication Sig   Accu-Chek Softclix Lancets lancets Use as instructed. Accu-chek Guide   AMBULATORY NON FORMULARY MEDICATION Knee-high, medium compression, graduated compression stockings. Apply to lower extremities. [email or web address], Zippered Compression Stockings, medium circ, long length   ammonium lactate  (LAC-HYDRIN ) 12 % lotion APPLY TOPICALLY TO THE AFFECTED AREA AS NEEDED FOR DRY SKIN   ASPIRIN PO Take 81 mg by mouth daily.   blood glucose meter kit and supplies Dispense based on patient and insurance preference. Check daily. (FOR ICD-10 E10.9,  E11.9).   dapagliflozin  propanediol (FARXIGA ) 5 MG TABS tablet TAKE 1 TABLET(5 MG) BY MOUTH DAILY   DICLOFENAC  SODIUM EX Apply topically as needed.   Dulaglutide  (TRULICITY ) 0.75 MG/0.5ML SOPN Inject 1.5 mg into the skin once a week.   esomeprazole  (NEXIUM ) 20 MG capsule Take 1 capsule (20 mg total) by mouth daily at 12 noon. TAKE 1 CAPSULE BY MOUTH DAILY AT 12:00 NOON   fluticasone  (FLONASE ) 50 MCG/ACT nasal spray Place into both nostrils as needed for allergies or rhinitis.   glucose blood (ACCU-CHEK GUIDE) test strip USE AS INSTRUCTED TO TEST ONCE DAILY   isosorbide  mononitrate (IMDUR ) 60 MG 24 hr tablet TAKE 1 TABLET(60 MG) BY MOUTH DAILY   levothyroxine  (SYNTHROID ) 88 MCG tablet Take 88 mcg by mouth daily before breakfast.   lisinopril -hydrochlorothiazide  (ZESTORETIC ) 20-12.5 MG tablet TAKE 1 TABLET BY MOUTH DAILY   metFORMIN  (GLUCOPHAGE ) 1000 MG tablet TAKE 1 TABLET(1000 MG) BY MOUTH EVERY MORNING   metoprolol  succinate (TOPROL -XL) 50 MG 24 hr tablet TAKE 1 TABLET(50 MG) BY MOUTH DAILY WITH OR IMMEDIATELY FOLLOWING A MEAL   nitroGLYCERIN  (NITROSTAT ) 0.4 MG SL tablet DISSOLVE 1 TABLET UNDER THE TONGUE EVERY 5 MINUTES, AS NEEDED FOR CHEST PAIN. MAX 3 TABLETS IN 15 MINUTES   rosuvastatin  (CRESTOR ) 40 MG tablet TAKE 1 TABLET(40 MG) BY MOUTH AT BEDTIME   triamcinolone  cream (KENALOG ) 0.1 %    VITAMIN D  PO Take by mouth.   naproxen  (NAPROSYN ) 500  MG tablet Take 1 tablet (500 mg total) by mouth 2 (two) times daily with a meal. (Patient not taking: Reported on 09/05/2023)   valACYclovir  (VALTREX ) 1000 MG tablet Take 1 tablet (1,000 mg total) by mouth 3 (three) times daily. (Patient not taking: Reported on 09/05/2023)   [DISCONTINUED] levothyroxine  (SYNTHROID ) 75 MCG tablet TAKE 1 TABLET(75 MCG) BY MOUTH DAILY BEFORE BREAKFAST   No facility-administered encounter medications on file as of 09/05/2023.    Past Medical History:  Diagnosis Date   Aortic atherosclerosis (HCC) 06/10/2017   CAD  (coronary artery disease)    Cancer (HCC)    Combined form of age-related cataract, both eyes 04/07/2017   Dermatochalasis of both eyelids 04/07/2017   Diabetes mellitus without complication (HCC)    GERD (gastroesophageal reflux disease)    Hypertension    Hypothyroidism    Lacunar infarction (HCC) 07/20/2018   Left renal mass 10/04/2018   1.7 cm hypoechoic mass 10/04/2018   Lumbar degenerative disc disease 03/09/2017   Myocardial infarction (HCC) 2005   Normocytic anemia 12/14/2018   Palpitations    Papillary thyroid  carcinoma (HCC)    Posterior vitreous detachment, right eye 04/07/2017    Past Surgical History:  Procedure Laterality Date   TOTAL THYROIDECTOMY  2016    Family History  Problem Relation Age of Onset   Hypertension Mother    Hypertension Sister    Hypertension Brother    Breast cancer Daughter    Hypertension Sister     Social History   Socioeconomic History   Marital status: Divorced    Spouse name: Not on file   Number of children: 1   Years of education: 16   Highest education level: Bachelor's degree (e.g., BA, AB, BS)  Occupational History   Occupation: Retired.  Tobacco Use   Smoking status: Former    Passive exposure: Never   Smokeless tobacco: Never  Vaping Use   Vaping status: Never Used  Substance and Sexual Activity   Alcohol use: No   Drug use: No   Sexual activity: Not Currently  Other Topics Concern   Not on file  Social History Narrative   Lives alone. She has one daughter who lives close by. She enjoys dancing, playing cards and travel.    Social Drivers of Health   Financial Resource Strain: Medium Risk (08/04/2023)   Received from [facility]   Overall Financial Resource Strain (CARDIA)    Difficulty of Paying Living Expenses: Somewhat hard  Food Insecurity: No Food Insecurity (08/04/2023)   Received from [facility]   Hunger Vital Sign    Worried About Running Out of Food in the Last Year: Never true    Ran Out of  Food in the Last Year: Never true  Transportation Needs: No Transportation Needs (08/04/2023)   [facility] from [facility] - Transportation    Lack of Transportation (Medical): No    Lack of Transportation (Non-Medical): No  Physical Activity: Not on File (08/03/2019)   Received from [place], [place]   Physical Activity    Physical Activity: 0  Stress: No Stress Concern Present (09/24/2022)   [employer] of Occupational Health - Occupational Stress Questionnaire    Feeling of Stress : Only a little  Social Connections: Moderately Integrated (09/24/2022)   Social Connection and Isolation Panel [NHANES]    Frequency of Communication with Friends and Family: More than three times a week    Frequency of Social Gatherings with Friends and Family: Three times a  week    Attends Religious Services: More than 4 times per year    Active Member of Clubs or Organizations: Yes    Attends Banker Meetings: More than 4 times per year    Marital Status: Divorced  Intimate Partner Violence: Not At Risk (09/24/2022)   Humiliation, Afraid, Rape, and Kick questionnaire    Fear of Current or Ex-Partner: No    Emotionally Abused: No    Physically Abused: No    Sexually Abused: No    Review of Systems  Musculoskeletal:  Positive for back pain.  Skin:  Positive for rash.  All other systems reviewed and are negative.      Objective    BP (!) 92/58   Pulse 70   Temp 97.9 F (36.6 C) (Oral)   Resp 18   Ht 5\' 3"  (1.6 m)   Wt 162 lb 12.8 oz (73.8 kg)   SpO2 95%   BMI 28.84 kg/m   Physical Exam Vitals and nursing note reviewed.  Constitutional:      Appearance: Normal appearance. She is normal weight.  HENT:     Head: Normocephalic and atraumatic.     Right Ear: External ear normal.     Left Ear: External ear normal.     Nose: Nose normal.     Mouth/Throat:     Mouth: Mucous membranes are moist.     Pharynx: Oropharynx is clear.  Eyes:     Conjunctiva/sclera:  Conjunctivae normal.     Pupils: Pupils are equal, round, and reactive to light.  Cardiovascular:     Rate and Rhythm: Normal rate and regular rhythm.     Pulses: Normal pulses.     Heart sounds: Normal heart sounds.  Pulmonary:     Effort: Pulmonary effort is normal.     Breath sounds: Normal breath sounds.  Skin:    General: Skin is warm.     Capillary Refill: Capillary refill takes less than 2 seconds.     Findings: Lesion present.     Comments: Eczematic lesion to left neck skin fold  Neurological:     General: No focal deficit present.     Mental Status: She is alert and oriented to person, place, and time. Mental status is at baseline.  Psychiatric:        Mood and Affect: Mood normal.        Behavior: Behavior normal.        Thought Content: Thought content normal.        Judgment: Judgment normal.   Last hemoglobin A1c Lab Results  Component Value Date   HGBA1C 6.3 (A) 06/30/2023   HGBA1C 6.3 06/30/2023   Last thyroid  functions Lab Results  Component Value Date   TSH 1.76 02/05/2022        Assessment & Plan:   Problem List Items Addressed This Visit   None Encounter to establish care with new doctor  Flexural eczema -     Triamcinolone  Acetonide; Apply topically 2 (two) times daily as needed.  Dispense: 30 g; Refill: 0  Kidney cysts -     US  RENAL; Future  Need for COVID-19 vaccine -     PFIZER Comirnaty (GRAY TOP)COVID-19 Vaccine  Hypothyroidism, postsurgical  Controlled type 2 diabetes mellitus without complication, without long-term current use of insulin  (HCC)   Suspect eczema for reason for lesion on left neck skin fold. Advised to use steroid triamcinolone  cream BID prn. Due to flank pain and hx of kidney  cysts, refer for repeat ultrasound to monitor cysts.  Covid vaccine booster today To continue follow up with Endocrinologist as scheduled. Continue Synthroid  75mcg daily. Diabetes controlled, continue current regimen of Farxiga  5mg , Trulicity   1.5 mg weekly and Metformin  1000mg  daily. To return in June for repeat A1c.   No follow-ups on file.   Manette Section, MD

## 2023-09-06 ENCOUNTER — Encounter: Payer: Self-pay | Admitting: Family Medicine

## 2023-09-06 ENCOUNTER — Other Ambulatory Visit: Payer: 59

## 2023-09-07 ENCOUNTER — Ambulatory Visit: Payer: 59 | Admitting: Physical Therapy

## 2023-09-08 ENCOUNTER — Ambulatory Visit: Payer: 59

## 2023-09-08 DIAGNOSIS — N281 Cyst of kidney, acquired: Secondary | ICD-10-CM

## 2023-09-09 ENCOUNTER — Encounter: Payer: Self-pay | Admitting: Family Medicine

## 2023-09-13 ENCOUNTER — Ambulatory Visit: Payer: 59 | Attending: Cardiology | Admitting: Cardiology

## 2023-09-13 ENCOUNTER — Encounter: Payer: Self-pay | Admitting: Cardiology

## 2023-09-13 VITALS — BP 102/60 | HR 78 | Ht 63.0 in | Wt 160.0 lb

## 2023-09-13 DIAGNOSIS — I2510 Atherosclerotic heart disease of native coronary artery without angina pectoris: Secondary | ICD-10-CM

## 2023-09-13 DIAGNOSIS — R002 Palpitations: Secondary | ICD-10-CM | POA: Diagnosis not present

## 2023-09-13 DIAGNOSIS — E785 Hyperlipidemia, unspecified: Secondary | ICD-10-CM

## 2023-09-13 DIAGNOSIS — I10 Essential (primary) hypertension: Secondary | ICD-10-CM | POA: Diagnosis not present

## 2023-09-13 LAB — HEPATIC FUNCTION PANEL
ALT: 23 [IU]/L (ref 0–32)
AST: 24 [IU]/L (ref 0–40)
Albumin: 4.1 g/dL (ref 3.8–4.8)
Alkaline Phosphatase: 72 [IU]/L (ref 44–121)
Bilirubin Total: 0.3 mg/dL (ref 0.0–1.2)
Bilirubin, Direct: 0.12 mg/dL (ref 0.00–0.40)
Total Protein: 6.7 g/dL (ref 6.0–8.5)

## 2023-09-13 LAB — LIPID PANEL
Chol/HDL Ratio: 2.2 {ratio} (ref 0.0–4.4)
Cholesterol, Total: 144 mg/dL (ref 100–199)
HDL: 66 mg/dL (ref >39)
LDL Chol Calc (NIH): 66 mg/dL (ref 0–99)
Triglycerides: 60 mg/dL (ref 0–149)
VLDL Cholesterol Cal: 12 mg/dL (ref 5–40)

## 2023-09-13 NOTE — Patient Instructions (Signed)

## 2023-09-13 NOTE — Addendum Note (Signed)
 Addended by: Freddi Starr on: 09/13/2023 08:22 AM   Modules accepted: Orders

## 2023-09-14 ENCOUNTER — Encounter: Payer: Self-pay | Admitting: *Deleted

## 2023-09-14 ENCOUNTER — Encounter: Payer: Self-pay | Admitting: Physical Therapy

## 2023-09-14 ENCOUNTER — Ambulatory Visit: Payer: 59 | Admitting: Physical Therapy

## 2023-09-14 DIAGNOSIS — M6281 Muscle weakness (generalized): Secondary | ICD-10-CM

## 2023-09-14 DIAGNOSIS — M25562 Pain in left knee: Secondary | ICD-10-CM | POA: Diagnosis not present

## 2023-09-14 DIAGNOSIS — G8929 Other chronic pain: Secondary | ICD-10-CM

## 2023-09-14 DIAGNOSIS — M25561 Pain in right knee: Secondary | ICD-10-CM | POA: Diagnosis not present

## 2023-09-14 NOTE — Therapy (Signed)
 OUTPATIENT PHYSICAL THERAPY TREATMENT AND DISCHARGE   Patient Name: Sara Gardner MRN: [id number] DOB:Oct 05, 1950, 73 y.o., female Today's Date: 09/14/2023 Dates of service: 05/18/23-08/03/23 END OF SESSION:  PT End of Session - 09/14/23 0922     Visit Number 15    Number of Visits 16    Date for PT Re-Evaluation 09/14/23    Authorization Type [employer] Medicare    Authorization - Visit Number 15    Progress Note Due on Visit 20    PT Start Time 0845    PT Stop Time 0923    PT Time Calculation (min) 38 min    Activity Tolerance Patient tolerated treatment well    Behavior During Therapy [facility] for tasks assessed/performed                          Past Medical History:  Diagnosis Date   Aortic atherosclerosis (HCC) 06/10/2017   CAD (coronary artery disease)    Cancer (HCC)    Combined form of age-related cataract, both eyes 04/07/2017   Dermatochalasis of both eyelids 04/07/2017   Diabetes mellitus without complication (HCC)    GERD (gastroesophageal reflux disease)    Hypertension    Hypothyroidism    Lacunar infarction (HCC) 07/20/2018   Left renal mass 10/04/2018   1.7 cm hypoechoic mass 10/04/2018   Lumbar degenerative disc disease 03/09/2017   Myocardial infarction (HCC) 2005   Normocytic anemia 12/14/2018   Palpitations    Papillary thyroid carcinoma (HCC)    Posterior vitreous detachment, right eye 04/07/2017   Past Surgical History:  Procedure Laterality Date   TOTAL THYROIDECTOMY  2016   Patient Active Problem List   Diagnosis Date Noted   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/05/2019   Cervicalgia 04/05/2019   Eustachian tube dysfunction, bilateral 04/05/2019   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   White matter abnormality on MRI of brain 04/05/2019   Myalgia due to statin 12/14/2018   Normocytic anemia 12/14/2018   At moderate risk for fall 10/03/2018   Bilateral renal cysts 10/03/2018   Facet arthropathy, lumbosacral 10/03/2018    Lacunar infarction (HCC) 07/20/2018   Controlled type 2 diabetes mellitus without complication, without long-term current use of insulin (HCC) 03/08/2018   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/23/2017   Overactive bladder 08/17/2017   Equinus contracture of ankle 07/27/2017   Hammer toes of both feet 07/27/2017   Dermatophytosis, nail 07/27/2017   Calcaneal spur of both feet 07/27/2017   Aortic atherosclerosis (HCC) 06/10/2017   Combined form of age-related cataract, both eyes 04/07/2017   Dermatochalasis of both eyelids 04/07/2017   Posterior vitreous detachment, right eye 04/07/2017   Postural kyphosis of cervicothoracic region 03/09/2017   Lumbar degenerative disc disease 03/09/2017   Bilateral primary osteoarthritis of knee 12/15/2016   Allergy to honey bee venom 12/15/2016   Hypertension 02/16/2016   DM type 2 with diabetic dyslipidemia (HCC) 02/10/2016   Gastroesophageal reflux disease without esophagitis 02/10/2016   Left ventricular diastolic dysfunction, NYHA class 1 01/28/2016   Hypothyroidism, 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   Urticaria 12/30/2015   Diabetic sensorimotor polyneuropathy (HCC) 10/27/2015   Low back strain 10/27/2015   Right inguinal pain 04/21/2015   Healthcare maintenance 12/30/2014   Skin rash 09/16/2014   Spider veins of both lower extremities 09/16/2014   Thyroid cancer (HCC) 08/17/2014   Papillary thyroid carcinoma (HCC) 08/09/2014   Post-menopausal atrophic vaginitis 06/24/2014   Nodule of left lung 01/21/2014   Class 1 obesity due  to excess calories with serious comorbidity and body mass index (BMI) of 30.0 to 30.9 in adult 06/12/2012   Palpitations 11/29/2011   Cutaneous horn 09/29/2011   Abnormal mammogram of right breast 08/30/2011   Arthritis of left knee 05/24/2011   CAD (coronary artery disease) 05/24/2011   History of acute myocardial infarction of inferior wall 05/24/2011   Gastro-esophageal reflux disease with esophagitis  05/24/2011   Hyperlipidemia 05/24/2011    PCP: Christen Butter  REFERRING PROVIDER: Christen Butter  REFERRING DIAG: pain in bilateral lower extremities  THERAPY DIAG:  Chronic pain of both knees  Muscle weakness (generalized)  Rationale for Evaluation and Treatment: Rehabilitation  ONSET DATE: 03/2023  SUBJECTIVE:   SUBJECTIVE STATEMENT: Pt states she has been moving so she is more sore today.   PERTINENT HISTORY: CAD, DM  Pt states that a month ago her legs swelled up and began burning. She states she has had difficulty with stairs and has even had to walk with a cane due to pain. She states she has pain in both knees, as well as burning feeling but only when she is at home. She states that she has had to be more cautious with walking due to increased fear of falling due to pain. PAIN:  Are you having pain? Yes: NPRS scale: 7/10 Pain location: bilateral knees Pain description: sore/achey Aggravating factors: stairs, walking Relieving factors: rest, voltaren  PRECAUTIONS: None  RED FLAGS: None   WEIGHT BEARING RESTRICTIONS: No  FALLS:  Has patient fallen in last 6 months? No  LIVING ENVIRONMENT: Lives with: lives alone Lives in: House/apartment Stairs: Yes: External: 5 steps; bilateral but cannot reach both Has following equipment at home: Single point cane  OCCUPATION: just retired from [employer], active with her church  PLOF: Independent  PATIENT GOALS: decrease pain  NEXT MD VISIT: PRN  OBJECTIVE:  Note: Objective measures were completed at Evaluation unless otherwise noted.  DIAGNOSTIC FINDINGS: Ultrasound: negative for DVT  COGNITION: Overall cognitive status: Within functional limits for tasks assessed     SENSATION: Pt c/o burning in bilateral LEs   MUSCLE LENGTH: Hamstrings: Right 95 deg; Left 95 deg   PALPATION: TTP bilat lateral, medial and posterior knees Patella mobility WFL  LOWER EXTREMITY ROM:  AROM ROM Right eval Left eval  06/08/23  Hip flexion     Hip extension     Hip abduction     Hip adduction     Hip internal rotation     Hip external rotation     Knee flexion 109 110 Lt: 112; Rt:113   Knee extension 0 0   Ankle dorsiflexion     Ankle plantarflexion     Ankle inversion     Ankle eversion      (Blank rows = not tested)  LOWER EXTREMITY MMT:  MMT Right eval Left eval  Hip flexion 4- 4  Hip extension 3+ 3+  Hip abduction 3+ 3+  Hip adduction    Hip internal rotation    Hip external rotation    Knee flexion 4- 4-  Knee extension 4 4  Ankle dorsiflexion    Ankle plantarflexion    Ankle inversion    Ankle eversion     (Blank rows = not tested)  LOWER EXTREMITY SPECIAL TESTS:  Ober's negative bilat  FUNCTIONAL TESTS:  5 times sit to stand: 30.10 seconds  (eval), 16.14 seconds (11/22) SLS: unable due to pain  GAIT: Stairs: pt leads with Rt LE due to Lt knee  pain. Needs bilat handrails. Step to pattern   TODAY'S TREATMENT:      [facility] Adult PT Treatment:                                                DATE: 09/14/23 Therapeutic Exercise/Activity: Dorene Grebe 2# x 20 bilat HS curl green TB x 20 bilat SLR x 20 bilat Bridge x 20 Supine clam 3 x 10 green TB Sidelying hip abd 2 x 10 Sidelying clam 2 x 10 Education on d/c recommendations, activity modifications and pain management   [facility] Adult PT Treatment:                                                DATE: 08/31/23 Therapeutic Exercise: Nustep L6 x 5 min for warm up SLR 2 x 10 bilat Bridge with clam green TB 2 x 10 Sidelying clam green TB 2 x 10 Sidelying hip abd 2 x 10 bilat LAQ 2 x 12 bilat 2# HS curl green TB 2 x 12 bilat  Therapeutic Activity: Education on pain management and activity modification   [facility] Adult PT Treatment:                                                DATE: 08/24/23 Therapeutic Exercise: Hip abd green TB x 20 Hip ext green TB x 20 HS curl green TB x 20 Step ups 4'' step 2 x 10 bilat Lateral step ups x 10  bilat 4'' STS x 10  Modalities: TENS Lt knee x 10 min to tolerance  [facility] Adult PT Treatment:                                                DATE: 08/17/23 Therapeutic Exercise: Nustep L6 x 5 min Hip abd red TB x 20 Hip ext red TB x 20 HS curl red TB x 20 Step up 4'' step x 10 bilat Lateral step up 4'' step x 10 bilat LAQ 4# Rt, 2# Lt 2 x 20 HS curl green TB x 20 bilat Bridge x 20 SLR 2 x 10 bilat Sidelying clam x 20 bilat Supine hip abd green TB x 20 STS x 10   PATIENT EDUCATION:  Education details: Updated HEP Person educated: Patient Education method: Explanation Education comprehension: verbalized understanding  HOME EXERCISE PROGRAM: Access Code: [email or web address] URL: [email or web address] Date: 06/29/2023 Prepared by: Carlynn Herald  Exercises - Seated Calf Stretch with Strap  - 1 x daily - 7 x weekly - 1 sets - 3 reps - 20-30 seconds hold - Seated Hamstring Stretch  - 1 x daily - 7 x weekly - 1 sets - 3 reps - 20-30 seconds hold - Sit to Stand with Armchair  - 1 x daily - 7 x weekly - 2 sets - 10 reps - Supine Short Arc Quad  - 1 x daily - 7 x weekly - 3 sets - 10 reps - Seated Long Arc Quad  -  1 x daily - 7 x weekly - 3 sets - 10 reps - Heel Raises with Counter Support  - 1 x daily - 7 x weekly - 3 sets - 10 reps - Side Stepping with Resistance at Thighs and Counter Support  - 1 x daily - 7 x weekly - 3 sets - 10 reps  ASSESSMENT:  CLINICAL IMPRESSION: Pt is independent with HEP and has improved strength and activity tolerance. She still is limited by pain and has been educated on methods for pain control. She feels ready to d/c to HEP at this time.   OBJECTIVE IMPAIRMENTS: decreased activity tolerance, decreased balance, difficulty walking, decreased ROM, decreased strength, and pain.     GOALS: Goals reviewed with patient? Yes  SHORT TERM GOALS: Target date: 06/15/2023  Pt will be independent with initial HEP Baseline: Goal status: MET  2.   Pt will demo improved strength with 5 x STS <= 25 seconds Baseline:  Goal status: MET   LONG TERM GOALS: Target date: 09/14/2023   Pt will be independent in advanced HEP Baseline:  Goal status: MET  2.  Pt will demo improved strength with 5 x STS <= 20 seconds Baseline:  Goal status:MET  3.  Pt will negotiate 1 flight of stairs without handrail with reciprocal pattern Baseline:  Goal status: NOT MET  4.  Pt will tolerate SLS x 10 seconds bilat LE to improve balance and reduce risk of falls Baseline: unable on Lt due to pain Goal status: NOT MET    PLAN:  PT FREQUENCY: 1x/week  PT DURATION: 6 weeks  PLANNED INTERVENTIONS: 97164- PT Re-evaluation, 97110-Therapeutic exercises, 97530- Therapeutic activity, 97112- Neuromuscular re-education, 97535- Self Care, 28413- Manual therapy, [id number]- Aquatic Therapy, 97014- Electrical stimulation (unattended), 97016- Vasopneumatic device, 97033- Ionotophoresis 4mg /ml Dexamethasone, Patient/Family education, Balance training, Taping, Dry Needling, Cryotherapy, and Moist heat  PLAN FOR NEXT SESSION: d/c  PHYSICAL THERAPY DISCHARGE SUMMARY  Visits from Start of Care: 15  Current functional level related to goals / functional outcomes: Improved activity tolerance   Remaining deficits: See above   Education / Equipment: HEP   Patient agrees to discharge. Patient goals were partially met. Patient is being discharged due to being pleased with the current functional level.   Reggy Eye, PT,DPT02/19/259:23 AM

## 2023-09-19 DIAGNOSIS — B351 Tinea unguium: Secondary | ICD-10-CM | POA: Diagnosis not present

## 2023-09-19 DIAGNOSIS — M2041 Other hammer toe(s) (acquired), right foot: Secondary | ICD-10-CM | POA: Diagnosis not present

## 2023-09-19 DIAGNOSIS — E1142 Type 2 diabetes mellitus with diabetic polyneuropathy: Secondary | ICD-10-CM | POA: Diagnosis not present

## 2023-09-19 DIAGNOSIS — M2042 Other hammer toe(s) (acquired), left foot: Secondary | ICD-10-CM | POA: Diagnosis not present

## 2023-09-19 DIAGNOSIS — L851 Acquired keratosis [keratoderma] palmaris et plantaris: Secondary | ICD-10-CM | POA: Diagnosis not present

## 2023-09-26 ENCOUNTER — Ambulatory Visit (INDEPENDENT_AMBULATORY_CARE_PROVIDER_SITE_OTHER): Payer: 59 | Admitting: Family Medicine

## 2023-09-26 ENCOUNTER — Ambulatory Visit: Payer: 59

## 2023-09-26 ENCOUNTER — Encounter: Payer: Self-pay | Admitting: Family Medicine

## 2023-09-26 VITALS — Ht 63.0 in | Wt 157.0 lb

## 2023-09-26 DIAGNOSIS — E1169 Type 2 diabetes mellitus with other specified complication: Secondary | ICD-10-CM

## 2023-09-26 DIAGNOSIS — Z7984 Long term (current) use of oral hypoglycemic drugs: Secondary | ICD-10-CM | POA: Diagnosis not present

## 2023-09-26 DIAGNOSIS — E1142 Type 2 diabetes mellitus with diabetic polyneuropathy: Secondary | ICD-10-CM

## 2023-09-26 DIAGNOSIS — Z0000 Encounter for general adult medical examination without abnormal findings: Secondary | ICD-10-CM | POA: Diagnosis not present

## 2023-09-26 DIAGNOSIS — E785 Hyperlipidemia, unspecified: Secondary | ICD-10-CM | POA: Diagnosis not present

## 2023-09-26 NOTE — Progress Notes (Signed)
 Sara Gardner - 73 y.o. female MRN [id number]  Date of birth: 03/13/1951  Subjective Chief Complaint  Patient presents with   Diabetes    HPI Sara Gardner is a 73 y.o. female here today for follow up.  She was previously seeing Sara Gardner here in the office but recently established with Dr. Wyline Gardner.  She is here today because she needs paperwork completed to renew rx for diabetic shoes.  She had initially refused to schedule with physician to have paperwork completed in December.  She has had diabetic shoes previously and these work well for her.   She did have recent visit with Dr. Wyline Gardner and tells me that paperwork was refused to be completed  at this visit and she was told to go back to her podiatrist.  She returned to her podiatrist who explained to her that a physician needed to sign off on this.  She returned last Friday to our clinic, frustrated by the run-around so I agreed to see her since they were originally trying to schedule her with me when she was seeing Sara Gardner. Her diabetes is better controlled based on A1c in December.  She is doing well with current medications at this time. She does have neuropathy associated with her diabetes.  No history of ulceration, infection or amputation.  No known history of PAD.    ROS:  A comprehensive ROS was completed and negative except as noted per HPI  Allergies  Allergen Reactions   Bee Venom Swelling   Tape Rash   Tuberculin Tests Other (See Comments)    Past Medical History:  Diagnosis Date   Aortic atherosclerosis (HCC) 06/10/2017   CAD (coronary artery disease)    Cancer (HCC)    Combined form of age-related cataract, both eyes 04/07/2017   Dermatochalasis of both eyelids 04/07/2017   Diabetes mellitus without complication (HCC)    GERD (gastroesophageal reflux disease)    Hypertension    Hypothyroidism    Lacunar infarction (HCC) 07/20/2018   Left renal mass 10/04/2018   1.7 cm hypoechoic mass 10/04/2018   Lumbar  degenerative disc disease 03/09/2017   Myocardial infarction (HCC) 2005   Normocytic anemia 12/14/2018   Palpitations    Papillary thyroid carcinoma (HCC)    Posterior vitreous detachment, right eye 04/07/2017    Past Surgical History:  Procedure Laterality Date   TOTAL THYROIDECTOMY  2016    Social History   Socioeconomic History   Marital status: Divorced    Spouse name: Not on file   Number of children: 1   Years of education: 16   Highest education level: Bachelor's degree (e.g., BA, AB, BS)  Occupational History   Occupation: Retired.  Tobacco Use   Smoking status: Former    Passive exposure: Never   Smokeless tobacco: Never  Vaping Use   Vaping status: Never Used  Substance and Sexual Activity   Alcohol use: No   Drug use: No   Sexual activity: Not Currently  Other Topics Concern   Not on file  Social History Narrative   Lives alone. She has one daughter who lives close by. She enjoys dancing, playing cards and travel.    Social Drivers of Corporate investment banker Strain: Low Risk  (09/26/2023)   Overall Financial Resource Strain (CARDIA)    Difficulty of Paying Living Expenses: Not hard at all  Recent Concern: Financial Resource Strain - Medium Risk (08/04/2023)   Received from [facility]   Overall Financial Resource Strain (  CARDIA)    Difficulty of Paying Living Expenses: Somewhat hard  Food Insecurity: No Food Insecurity (09/26/2023)   Hunger Vital Sign    Worried About Running Out of Food in the Last Year: Never true    Ran Out of Food in the Last Year: Never true  Transportation Needs: No Transportation Needs (09/26/2023)   PRAPARE - Administrator, Civil Service (Medical): No    Lack of Transportation (Non-Medical): No  Physical Activity: Insufficiently Active (09/26/2023)   Exercise Vital Sign    Days of Exercise per Week: 2 days    Minutes of Exercise per Session: 50 min  Stress: No Stress Concern Present (09/26/2023)   [employer]  of Occupational Health - Occupational Stress Questionnaire    Feeling of Stress : Only a little  Social Connections: Moderately Integrated (09/26/2023)   Social Connection and Isolation Panel [NHANES]    Frequency of Communication with Friends and Family: More than three times a week    Frequency of Social Gatherings with Friends and Family: Three times a week    Attends Religious Services: More than 4 times per year    Active Member of Clubs or Organizations: Yes    Attends Engineer, structural: More than 4 times per year    Marital Status: Divorced    Family History  Problem Relation Age of Onset   Hypertension Mother    Hypertension Sister    Hypertension Brother    Breast cancer Daughter    Hypertension Sister     Health Maintenance  Topic Date Due   OPHTHALMOLOGY EXAM  10/25/2022   Diabetic kidney evaluation - Urine ACR  09/24/2023   Zoster Vaccines- Shingrix (1 of 2) 09/28/2023 (Originally [birth year]1969)   COVID-19 Vaccine (7 - 2024-25 season) 10/31/2023   HEMOGLOBIN A1C  12/29/2023   FOOT EXAM  06/29/2024   Diabetic kidney evaluation - eGFR measurement  08/24/2024   Medicare Annual Wellness (AWV)  09/25/2024   DTaP/Tdap/Td (2 - Td or Tdap) 05/18/2025   MAMMOGRAM  09/01/2025   Colonoscopy  10/07/2027   Pneumonia Vaccine 18+ Years old  Completed   INFLUENZA VACCINE  Completed   DEXA SCAN  Completed   Hepatitis C Screening  Completed   HPV VACCINES  Aged Out     ----------------------------------------------------------------------------------------------------------------------------------------------------------------------------------------------------------------- Physical Exam Ht 5\' 3"  (1.6 m)   Wt 157 lb (71.2 kg)   BMI 27.81 kg/m   Physical Exam Constitutional:      Appearance: Normal appearance.  Eyes:     General: No scleral icterus. Neurological:     Mental Status: She is alert.  Psychiatric:        Gardner and Affect: Gardner normal.         Behavior: Behavior normal.     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  Her A1c in December was 6.3%.  She is doing well with current medications  at this time. She is seeing podiatry for foot care and they have recommended continuation of diabetic footwear.  I agree that she would continue to benefit from diabetic footwear to prevent ulceration and infection as she has decreased sensation related to her diabetes.  She will continue her diabetic care with Dr. Wyline Gardner.  I also let her know that Dr. Wyline Gardner should be able to complete her diabetic footwear paperwork in the future.  No orders of the defined types were placed in this encounter.   No follow-ups on file.  Time statement: >30 minutes spent including pre visit preparation, review of prior notes and labs, encounter with patient and same day documentation.   This visit occurred during the SARS-CoV-2 public health emergency.  Safety protocols were in place, including screening questions prior to the visit, additional usage of staff PPE, and extensive cleaning of exam room while observing appropriate contact time as indicated for disinfecting solutions.

## 2023-09-26 NOTE — Assessment & Plan Note (Signed)
 Ms. Koren has history of Type 2 diabetes with polyneuropathy and is at high risk for foot problems due to this.  Her A1c in December was 6.3%.  She is doing well with current medications  at this time. She is seeing podiatry for foot care and they have recommended continuation of diabetic footwear.  I agree that she would continue to benefit from diabetic footwear to prevent ulceration and infection as she has decreased sensation related to her diabetes.  She will continue her diabetic care with Dr. Wyline Mood.  I also let her know that Dr. Wyline Mood should be able to complete her diabetic footwear paperwork in the future.

## 2023-09-26 NOTE — Progress Notes (Signed)
 Subjective:   Sara Gardner is a 73 y.o. female who presents for Medicare Annual (Subsequent) preventive examination.  Visit Complete: Virtual I connected with  Sara Gardner on 09/26/23 by a audio enabled telemedicine application and verified that I am speaking with the correct person using two identifiers.  Patient Location: Home  Provider Location: Office/Clinic  I discussed the limitations of evaluation and management by telemedicine. The patient expressed understanding and agreed to proceed.  Vital Signs: Because this visit was a virtual/telehealth visit, some criteria may be missing or patient reported. Any vitals not documented were not able to be obtained and vitals that have been documented are patient reported.  Patient Medicare AWV questionnaire was completed by the patient on 08/04/2023; I have confirmed that all information answered by patient is correct and no changes since this date.  Cardiac Risk Factors include: advanced age (>57men, >68 women);diabetes mellitus;obesity (BMI >30kg/m2);hypertension;dyslipidemia;family history of premature cardiovascular disease;smoking/ tobacco exposure     Objective:    Today's Vitals   09/26/23 0853 09/26/23 0854  Weight: 157 lb (71.2 kg)   Height: 5\' 3"  (1.6 m)   PainSc:  8    Body mass index is 27.81 kg/m.     09/26/2023    9:05 AM 09/05/2023    9:03 AM 08/25/2023    3:56 PM 04/07/2023    7:15 PM 09/24/2022    8:12 AM 09/10/2022    8:43 AM 09/06/2021    3:00 AM  Advanced Directives  Does Patient Have a Medical Advance Directive? No No No No No No No  Type of Advance Directive     Living will    Does patient want to make changes to medical advance directive?     No - Patient declined    Would patient like information on creating a medical advance directive? Yes (MAU/Ambulatory/Procedural Areas - Information given)   No - Patient declined       Current Medications (verified) Outpatient Encounter Medications as of  09/26/2023  Medication Sig   Accu-Chek Softclix Lancets lancets Use as instructed. Accu-chek Guide   AMBULATORY NON FORMULARY MEDICATION Knee-high, medium compression, graduated compression stockings. Apply to lower extremities. [email or web address], Zippered Compression Stockings, medium circ, long length   ammonium lactate (LAC-HYDRIN) 12 % lotion APPLY TOPICALLY TO THE AFFECTED AREA AS NEEDED FOR DRY SKIN   ASPIRIN PO Take 81 mg by mouth daily.   blood glucose meter kit and supplies Dispense based on patient and insurance preference. Check daily. (FOR ICD-10 E10.9, E11.9).   dapagliflozin propanediol (FARXIGA) 5 MG TABS tablet TAKE 1 TABLET(5 MG) BY MOUTH DAILY   DICLOFENAC SODIUM EX Apply topically as needed.   Dulaglutide (TRULICITY) 0.75 MG/0.5ML SOPN Inject 1.5 mg into the skin once a week.   esomeprazole (NEXIUM) 20 MG capsule Take 1 capsule (20 mg total) by mouth daily at 12 noon. TAKE 1 CAPSULE BY MOUTH DAILY AT 12:00 NOON   fluticasone (FLONASE) 50 MCG/ACT nasal spray Place into both nostrils as needed for allergies or rhinitis.   glucose blood (ACCU-CHEK GUIDE) test strip USE AS INSTRUCTED TO TEST ONCE DAILY   isosorbide mononitrate (IMDUR) 60 MG 24 hr tablet TAKE 1 TABLET(60 MG) BY MOUTH DAILY   levothyroxine (SYNTHROID) 88 MCG tablet Take 88 mcg by mouth daily before breakfast.   lisinopril-hydrochlorothiazide (ZESTORETIC) 20-12.5 MG tablet TAKE 1 TABLET BY MOUTH DAILY   metFORMIN (GLUCOPHAGE) 1000 MG tablet TAKE 1 TABLET(1000 MG) BY MOUTH EVERY MORNING   metoprolol succinate (TOPROL-XL)  50 MG 24 hr tablet TAKE 1 TABLET(50 MG) BY MOUTH DAILY WITH OR IMMEDIATELY FOLLOWING A MEAL   naproxen (NAPROSYN) 500 MG tablet Take 1 tablet (500 mg total) by mouth 2 (two) times daily with a meal.   nitroGLYCERIN (NITROSTAT) 0.4 MG SL tablet DISSOLVE 1 TABLET UNDER THE TONGUE EVERY 5 MINUTES, AS NEEDED FOR CHEST PAIN. MAX 3 TABLETS IN 15 MINUTES   rosuvastatin (CRESTOR) 40 MG tablet TAKE 1  TABLET(40 MG) BY MOUTH AT BEDTIME   triamcinolone cream (KENALOG) 0.1 % Apply topically 2 (two) times daily as needed.   VITAMIN D PO Take by mouth.   valACYclovir (VALTREX) 1000 MG tablet Take 1 tablet (1,000 mg total) by mouth 3 (three) times daily. (Patient not taking: Reported on 09/26/2023)   No facility-administered encounter medications on file as of 09/26/2023.    Allergies (verified) Bee venom, Tape, and Tuberculin tests   History: Past Medical History:  Diagnosis Date   Aortic atherosclerosis (HCC) 06/10/2017   CAD (coronary artery disease)    Cancer (HCC)    Combined form of age-related cataract, both eyes 04/07/2017   Dermatochalasis of both eyelids 04/07/2017   Diabetes mellitus without complication (HCC)    GERD (gastroesophageal reflux disease)    Hypertension    Hypothyroidism    Lacunar infarction (HCC) 07/20/2018   Left renal mass 10/04/2018   1.7 cm hypoechoic mass 10/04/2018   Lumbar degenerative disc disease 03/09/2017   Myocardial infarction (HCC) 2005   Normocytic anemia 12/14/2018   Palpitations    Papillary thyroid carcinoma (HCC)    Posterior vitreous detachment, right eye 04/07/2017   Past Surgical History:  Procedure Laterality Date   TOTAL THYROIDECTOMY  2016   Family History  Problem Relation Age of Onset   Hypertension Mother    Hypertension Sister    Hypertension Brother    Breast cancer Daughter    Hypertension Sister    Social History   Socioeconomic History   Marital status: Divorced    Spouse name: Not on file   Number of children: 1   Years of education: 16   Highest education level: Bachelor's degree (e.g., BA, AB, BS)  Occupational History   Occupation: Retired.  Tobacco Use   Smoking status: Former    Passive exposure: Never   Smokeless tobacco: Never  Vaping Use   Vaping status: Never Used  Substance and Sexual Activity   Alcohol use: No   Drug use: No   Sexual activity: Not Currently  Other Topics Concern   Not on  file  Social History Narrative   Lives alone. She has one daughter who lives close by. She enjoys dancing, playing cards and travel.    Social Drivers of Corporate investment banker Strain: Low Risk  (09/26/2023)   Overall Financial Resource Strain (CARDIA)    Difficulty of Paying Living Expenses: Not hard at all  Recent Concern: Financial Resource Strain - Medium Risk (08/04/2023)   Received from [facility]   Overall Financial Resource Strain (CARDIA)    Difficulty of Paying Living Expenses: Somewhat hard  Food Insecurity: No Food Insecurity (09/26/2023)   Hunger Vital Sign    Worried About Running Out of Food in the Last Year: Never true    Ran Out of Food in the Last Year: Never true  Transportation Needs: No Transportation Needs (09/26/2023)   PRAPARE - Administrator, Civil Service (Medical): No    Lack of Transportation (Non-Medical): No  Physical Activity:  Insufficiently Active (09/26/2023)   Exercise Vital Sign    Days of Exercise per Week: 2 days    Minutes of Exercise per Session: 50 min  Stress: No Stress Concern Present (09/26/2023)   [employer] of Occupational Health - Occupational Stress Questionnaire    Feeling of Stress : Only a little  Social Connections: Moderately Integrated (09/26/2023)   Social Connection and Isolation Panel [NHANES]    Frequency of Communication with Friends and Family: More than three times a week    Frequency of Social Gatherings with Friends and Family: Three times a week    Attends Religious Services: More than 4 times per year    Active Member of Clubs or Organizations: Yes    Attends Engineer, structural: More than 4 times per year    Marital Status: Divorced    Tobacco Counseling Counseling given: Not Answered   Clinical Intake:  Pre-visit preparation completed: Yes  Pain : 0-10 Pain Score: 8  Pain Type: Chronic pain Pain Location: Knee Pain Orientation: Left Pain Descriptors / Indicators: Aching Pain  Onset: More than a month ago Pain Frequency: Intermittent Pain Relieving Factors: Knee sleeve Effect of Pain on Daily Activities: sometimes  Pain Relieving Factors: Knee sleeve  BMI - recorded: 27.81 Nutritional Status: BMI 25 -29 Overweight Nutritional Risks: None Diabetes: Yes CBG done?: No Did pt. bring in CBG monitor from home?: No  How often do you need to have someone help you when you read instructions, pamphlets, or other written materials from your doctor or pharmacy?: 1 - Never What is the last grade level you completed in school?: 16  Interpreter Needed?: No      Activities of Daily Living    09/26/2023    8:57 AM  In your present state of health, do you have any difficulty performing the following activities:  Hearing? 0  Vision? 0  Difficulty concentrating or making decisions? 0  Walking or climbing stairs? 1  Comment Knee pain  Dressing or bathing? 0  Doing errands, shopping? 0  Preparing Food and eating ? N  Using the Toilet? N  In the past six months, have you accidently leaked urine? N  Do you have problems with loss of bowel control? N  Managing your Medications? N  Managing your Finances? N  Housekeeping or managing your Housekeeping? N    Patient Care Team: Suzan Slick, MD as PCP - General (Family Medicine) Jens Som Madolyn Frieze, MD as PCP - Cardiology (Cardiology) Levy Pupa, MD as Consulting Physician (Ophthalmology) Karen Kitchens, MD as Consulting Physician (Endocrinology)  Indicate any recent Medical Services you may have received from other than [facility] providers in the past year (date may be approximate).     Assessment:   This is a routine wellness examination for Sara Gardner.  Hearing/Vision screen No results found.   Goals Addressed             This Visit's Progress    Activity and Exercise Increased       She would like to increase activity.       Depression Screen    09/05/2023    9:03 AM 09/24/2022    8:13 AM  04/23/2022   11:32 AM 01/05/2022    4:06 PM 07/24/2021   10:44 AM 03/25/2021    3:32 PM 04/05/2019   10:10 AM  PHQ 2/9 Scores  PHQ - 2 Score 0 0 0 0 0 0 0  PHQ- 9 Score 0  Fall Risk    09/26/2023    9:05 AM 09/05/2023    9:03 AM 09/24/2022    8:12 AM 08/31/2022    8:19 AM 01/05/2022    4:06 PM  Fall Risk   Falls in the past year? 0 0 0 0 0  Number falls in past yr: 0 0 0 0 0  Injury with Fall? 0 0 0 0 0  Risk for fall due to : No Fall Risks No Fall Risks No Fall Risks No Fall Risks No Fall Risks  Follow up Falls evaluation completed Falls evaluation completed Falls evaluation completed Falls evaluation completed Falls evaluation completed    MEDICARE RISK AT HOME: Medicare Risk at Home Any stairs in or around the home?: No Home free of loose throw rugs in walkways, pet beds, electrical cords, etc?: Yes Adequate lighting in your home to reduce risk of falls?: Yes Life alert?: No Use of a cane, walker or w/c?: No Grab bars in the bathroom?: Yes Shower chair or bench in shower?: No Elevated toilet seat or a handicapped toilet?: No  TIMED UP AND GO:  Was the test performed?  No    Cognitive Function:        09/26/2023    9:06 AM 09/24/2022    8:30 AM 03/21/2018    2:56 PM  6CIT Screen  What Year? 0 points 0 points 0 points  What month? 0 points 0 points 0 points  What time? 0 points 0 points 0 points  Count back from 20 0 points 0 points 0 points  Months in reverse 0 points 0 points 0 points  Repeat phrase 0 points 0 points 0 points  Total Score 0 points 0 points 0 points    Immunizations Immunization History  Administered Date(s) Administered   Fluad Quad(high Dose 65+) 04/05/2019, 04/23/2022   Fluad Trivalent(High Dose 65+) 05/06/2023   Hepatitis B, ADULT 09/28/2019, 10/31/2019   Influenza Inj Mdck Quad Pf 04/21/2015   Influenza, High Dose Seasonal PF 05/28/2016, 04/24/2018   PFIZER Comirnaty(Gray Top)Covid-19 Tri-Sucrose Vaccine 09/05/2023   PFIZER(Purple  Top)SARS-COV-2 Vaccination 08/23/2019, 09/14/2019, 05/21/2020, 11/11/2020   Pfizer(Comirnaty)Fall Seasonal Vaccine 12 years and older 05/06/2023   Pneumococcal Conjugate-13 06/24/2014   Pneumococcal Polysaccharide-23 01/10/2017   Td (Adult), 2 Lf Tetanus Toxid, Preservative Free 08/21/2004   Tdap 05/19/2015    TDAP status: Up to date  Flu Vaccine status: Up to date  Pneumococcal vaccine status: Due, Education has been provided regarding the importance of this vaccine. Advised may receive this vaccine at local pharmacy or Health Dept. Aware to provide a copy of the vaccination record if obtained from local pharmacy or Health Dept. Verbalized acceptance and understanding.  Covid-19 vaccine status: Completed vaccines  Qualifies for Shingles Vaccine? Yes   Zostavax completed No   Shingrix Completed?: No.    Education has been provided regarding the importance of this vaccine. Patient has been advised to call insurance company to determine out of pocket expense if they have not yet received this vaccine. Advised may also receive vaccine at local pharmacy or Health Dept. Verbalized acceptance and understanding.  Screening Tests Health Maintenance  Topic Date Due   OPHTHALMOLOGY EXAM  10/25/2022   Diabetic kidney evaluation - Urine ACR  09/24/2023   Zoster Vaccines- Shingrix (1 of 2) 09/28/2023 (Originally [birth year]/1969)   COVID-19 Vaccine (7 - 2024-25 season) 10/31/2023   HEMOGLOBIN A1C  12/29/2023   FOOT EXAM  06/29/2024   Diabetic kidney evaluation - eGFR measurement  08/24/2024  Medicare Annual Wellness (AWV)  09/25/2024   DTaP/Tdap/Td (2 - Td or Tdap) 05/18/2025   MAMMOGRAM  09/01/2025   Colonoscopy  10/07/2027   Pneumonia Vaccine 110+ Years old  Completed   INFLUENZA VACCINE  Completed   DEXA SCAN  Completed   Hepatitis C Screening  Completed   HPV VACCINES  Aged Out    Health Maintenance  Health Maintenance Due  Topic Date Due   OPHTHALMOLOGY EXAM  10/25/2022   Diabetic  kidney evaluation - Urine ACR  09/24/2023    Colorectal cancer screening: Type of screening: Colonoscopy. Completed 10/06/2017. Repeat every 10 years  Mammogram status: Completed 09/02/2023. Repeat every year  Bone Density status: Completed 11/03/2022. Results reflect: Bone density results: NORMAL. Repeat every 2 years.  Lung Cancer Screening: (Low Dose CT Chest recommended if Age 33-80 years, 20 pack-year currently smoking OR have quit w/in 15years.) does not qualify.   Lung Cancer Screening Referral: n/a  Additional Screening:  Hepatitis C Screening: does not qualify; Completed 08/05/2021  Vision Screening: Recommended annual ophthalmology exams for early detection of glaucoma and other disorders of the eye. Is the patient up to date with their annual eye exam?  Yes  Who is the provider or what is the name of the office in which the patient attends annual eye exams? New provider If pt is not established with a provider, would they like to be referred to a provider to establish care?  N/a .   Dental Screening: Recommended annual dental exams for proper oral hygiene  Diabetic Foot Exam: Diabetic Foot Exam: Completed 06/30/2023  Community Resource Referral / Chronic Care Management: CRR required this visit?  No   CCM required this visit?  No     Plan:     I have personally reviewed and noted the following in the patient's chart:   Medical and social history Use of alcohol, tobacco or illicit drugs  Current medications and supplements including opioid prescriptions. Patient is not currently taking opioid prescriptions. Functional ability and status Nutritional status Physical activity Advanced directives List of other physicians Hospitalizations, surgeries, and ER visits in previous 12 months Vitals Screenings to include cognitive, depression, and falls Referrals and appointments  In addition, I have reviewed and discussed with patient certain preventive protocols, quality  metrics, and best practice recommendations. A written personalized care plan for preventive services as well as general preventive health recommendations were provided to patient.     Esmond Harps, CMA   09/26/2023   After Visit Summary: (Mail) Due to this being a telephonic visit, the after visit summary with patients personalized plan was offered to patient via mail   Nurse Notes:    Sara Gardner is a 73 y.o. female patient of Sara Butter, NP who had a Medicare Annual Wellness Visit today via telephone. Sara Gardner is Retired and lives alone. she has 1 child. She reports that she is socially active and does interact with friends/family regularly. She is minimally physically active and enjoys dancing, playing cards and travel.

## 2023-09-26 NOTE — Patient Instructions (Signed)
  Sara Gardner , Thank you for taking time to come for your Medicare Wellness Visit. I appreciate your ongoing commitment to your health goals. Please review the following plan we discussed and let me know if I can assist you in the future.   These are the goals we discussed:  Goals       Activity and Exercise Increased      She would like to increase activity.       Cut out extra servings      Exercise 3x per week (30 min per time)      HEMOGLOBIN A1C < 7.0      Patient Stated (pt-stated)      09/24/2022 AWV Goal: Diabetes Management  Patient will maintain an A1C level below 8.0 Patient will not develop any diabetic foot complications Patient will not experience any hypoglycemic episodes over the next 3 months Patient will notify our office of any CBG readings outside of the provider recommended range by calling [phone] Patient will adhere to provider recommendations for diabetes management  Patient Self Management Activities take all medications as prescribed and report any negative side effects monitor and record blood sugar readings as directed adhere to a low carbohydrate diet that incorporates lean proteins, vegetables, whole grains, low glycemic fruits check feet daily noting any sores, cracks, injuries, or callous formations see PCP or podiatrist if she notices any changes in her legs, feet, or toenails Patient will visit PCP and have an A1C level checked every 3 to 6 months as directed  have a yearly eye exam to monitor for vascular changes associated with diabetes and will request that the report be sent to her pcp.  consult with her PCP regarding any changes in her health or new or worsening symptoms         This is a list of the screening recommended for you and due dates:  Health Maintenance  Topic Date Due   Eye exam for diabetics  10/25/2022   Yearly kidney health urinalysis for diabetes  09/24/2023   Zoster (Shingles) Vaccine (1 of 2) 09/28/2023*   COVID-19  Vaccine (7 - 2024-25 season) 10/31/2023   Hemoglobin A1C  12/29/2023   Complete foot exam   06/29/2024   Yearly kidney function blood test for diabetes  08/24/2024   Medicare Annual Wellness Visit  09/25/2024   DTaP/Tdap/Td vaccine (2 - Td or Tdap) 05/18/2025   Mammogram  09/01/2025   Colon Cancer Screening  10/07/2027   Pneumonia Vaccine  Completed   Flu Shot  Completed   DEXA scan (bone density measurement)  Completed   Hepatitis C Screening  Completed   HPV Vaccine  Aged Out  *Topic was postponed. The date shown is not the original due date.

## 2023-10-31 ENCOUNTER — Other Ambulatory Visit: Payer: Self-pay | Admitting: Family Medicine

## 2023-10-31 DIAGNOSIS — K219 Gastro-esophageal reflux disease without esophagitis: Secondary | ICD-10-CM

## 2023-10-31 MED ORDER — ESOMEPRAZOLE MAGNESIUM 20 MG PO CPDR
20.0000 mg | DELAYED_RELEASE_CAPSULE | Freq: Every day | ORAL | 1 refills | Status: DC
Start: 2023-10-31 — End: 2024-04-26

## 2023-11-04 ENCOUNTER — Ambulatory Visit (INDEPENDENT_AMBULATORY_CARE_PROVIDER_SITE_OTHER): Payer: 59 | Admitting: Family Medicine

## 2023-11-04 ENCOUNTER — Encounter: Payer: Self-pay | Admitting: Family Medicine

## 2023-11-04 VITALS — BP 117/72 | HR 80 | Temp 97.7°F | Resp 18 | Ht 63.0 in | Wt 157.0 lb

## 2023-11-04 DIAGNOSIS — E119 Type 2 diabetes mellitus without complications: Secondary | ICD-10-CM | POA: Diagnosis not present

## 2023-11-04 DIAGNOSIS — S29011A Strain of muscle and tendon of front wall of thorax, initial encounter: Secondary | ICD-10-CM

## 2023-11-04 DIAGNOSIS — Z7984 Long term (current) use of oral hypoglycemic drugs: Secondary | ICD-10-CM

## 2023-11-04 NOTE — Progress Notes (Signed)
   Acute Office Visit  Subjective:     Patient ID: Sara Gardner, female    DOB: November 07, 1950, 73 y.o.   MRN: [id number]  Chief Complaint  Patient presents with   Acute Visit    HPI Patient is in today for acute visit.  Pt reports she was moving this week. She was lifting boxes. She thinks she has strained a muscle to her right anterior chest. Hurts to touch. No rash. She hasn't tried anything for it.  Pt is also diabetic and taking Farxiga 5mg , Metformin 1000mg  Daily and Trulicity 1.5 mg weekly. She checks sugars a few times a week. She had her diabetic eye exam this year.   Review of Systems  Musculoskeletal:        Right anterior chest pain  All other systems reviewed and are negative.      Objective:    BP 117/72   Pulse 80   Temp 97.7 F (36.5 C) (Oral)   Resp 18   Ht 5\' 3"  (1.6 m)   Wt 157 lb (71.2 kg)   SpO2 98%   BMI 27.81 kg/m    Physical Exam Vitals and nursing note reviewed.  Constitutional:      Appearance: Normal appearance. She is normal weight.  HENT:     Head: Normocephalic and atraumatic.     Right Ear: External ear normal.     Left Ear: External ear normal.     Nose: Nose normal.     Mouth/Throat:     Mouth: Mucous membranes are moist.     Pharynx: Oropharynx is clear.  Eyes:     Conjunctiva/sclera: Conjunctivae normal.     Pupils: Pupils are equal, round, and reactive to light.  Cardiovascular:     Rate and Rhythm: Normal rate.     Pulses: Normal pulses.  Musculoskeletal:     Comments: Right anterior chest wall pain with palpation  Skin:    General: Skin is warm.     Capillary Refill: Capillary refill takes less than 2 seconds.  Neurological:     General: No focal deficit present.     Mental Status: She is alert and oriented to person, place, and time. Mental status is at baseline.  Psychiatric:        Mood and Affect: Mood normal.        Behavior: Behavior normal.        Thought Content: Thought content normal.         Judgment: Judgment normal.   No results found for any visits on 11/04/23.      Assessment & Plan:   Problem List Items Addressed This Visit   None Muscle strain of anterior chest wall  Controlled type 2 diabetes mellitus without complication, without long-term current use of insulin (HCC) -     Hemoglobin A1c -     Microalbumin / creatinine urine ratio   Pt with anterior chest wall pain which is likely a muscle strain. Advised to use ice and voltaren gel prn. If worsening next week, let me know. Pt diabetic, checking A1c and urine micro.  Follow up in 6 months sooner prn  No orders of the defined types were placed in this encounter.   No follow-ups on file.  Suzan Slick, MD

## 2023-11-05 LAB — HEMOGLOBIN A1C
Est. average glucose Bld gHb Est-mCnc: 137 mg/dL
Hgb A1c MFr Bld: 6.4 % — ABNORMAL HIGH (ref 4.8–5.6)

## 2023-11-05 LAB — MICROALBUMIN / CREATININE URINE RATIO
Creatinine, Urine: 50 mg/dL
Microalb/Creat Ratio: 35 mg/g{creat} — ABNORMAL HIGH (ref 0–29)
Microalbumin, Urine: 17.3 ug/mL

## 2023-11-07 ENCOUNTER — Encounter: Payer: Self-pay | Admitting: Family Medicine

## 2023-11-16 DIAGNOSIS — M2041 Other hammer toe(s) (acquired), right foot: Secondary | ICD-10-CM | POA: Diagnosis not present

## 2023-11-16 DIAGNOSIS — M2042 Other hammer toe(s) (acquired), left foot: Secondary | ICD-10-CM | POA: Diagnosis not present

## 2023-11-16 DIAGNOSIS — E1142 Type 2 diabetes mellitus with diabetic polyneuropathy: Secondary | ICD-10-CM | POA: Diagnosis not present

## 2023-11-23 NOTE — Progress Notes (Signed)
 [facility] - Clinic Note  11/25/2023   CHIEF COMPLAINT Patient presents for Retina Follow Up  HISTORY OF PRESENT ILLNESS: Sara Gardner is a 72 y.o. female who presents to the clinic today for:  HPI     Retina Follow Up   Patient presents with  Other.  In both eyes.  This started 1 year ago.        Comments   Patient here for 1 year retina follow up for  DM exam. Patient states vision OD runs water. No eye pain.       Last edited by Sylvan Evener, COA on 11/25/2023  9:06 AM.     Patient feels her vision is doing well.   Referring physician: Cherre Cornish, NP [address] [place],  [place] [address]  HISTORICAL INFORMATION:  Selected notes from the MEDICAL RECORD NUMBER Referred by Cherre Cornish, NP for DM exam LEE:  Ocular Hx- PMH-   CURRENT MEDICATIONS: No current outpatient medications on file. (Ophthalmic Drugs)   No current facility-administered medications for this visit. (Ophthalmic Drugs)   Current Outpatient Medications (Other)  Medication Sig   Accu-Chek Softclix Lancets lancets Use as instructed. Accu-chek Guide   AMBULATORY NON FORMULARY MEDICATION Knee-high, medium compression, graduated compression stockings. Apply to lower extremities. [email or web address], Zippered Compression Stockings, medium circ, long length   ammonium lactate  (LAC-HYDRIN ) 12 % lotion APPLY TOPICALLY TO THE AFFECTED AREA AS NEEDED FOR DRY SKIN   ASPIRIN PO Take 81 mg by mouth daily.   blood glucose meter kit and supplies Dispense based on patient and insurance preference. Check daily. (FOR ICD-10 E10.9, E11.9).   dapagliflozin  propanediol (FARXIGA ) 5 MG TABS tablet TAKE 1 TABLET(5 MG) BY MOUTH DAILY   DICLOFENAC  SODIUM EX Apply topically as needed.   Dulaglutide  (TRULICITY ) 0.75 MG/0.5ML SOPN Inject 1.5 mg into the skin once a week.   esomeprazole  (NEXIUM ) 20 MG capsule Take 1 capsule (20 mg total) by mouth daily at 12 noon. TAKE 1  CAPSULE BY MOUTH DAILY AT 12:00 NOON   fluticasone  (FLONASE ) 50 MCG/ACT nasal spray Place into both nostrils as needed for allergies or rhinitis.   glucose blood (ACCU-CHEK GUIDE) test strip USE AS INSTRUCTED TO TEST ONCE DAILY   isosorbide  mononitrate (IMDUR ) 60 MG 24 hr tablet TAKE 1 TABLET(60 MG) BY MOUTH DAILY   levothyroxine  (SYNTHROID ) 88 MCG tablet Take 88 mcg by mouth daily before breakfast.   lisinopril -hydrochlorothiazide  (ZESTORETIC ) 20-12.5 MG tablet TAKE 1 TABLET BY MOUTH DAILY   metFORMIN  (GLUCOPHAGE ) 1000 MG tablet TAKE 1 TABLET(1000 MG) BY MOUTH EVERY MORNING   metoprolol  succinate (TOPROL -XL) 50 MG 24 hr tablet TAKE 1 TABLET(50 MG) BY MOUTH DAILY WITH OR IMMEDIATELY FOLLOWING A MEAL   nitroGLYCERIN  (NITROSTAT ) 0.4 MG SL tablet DISSOLVE 1 TABLET UNDER THE TONGUE EVERY 5 MINUTES, AS NEEDED FOR CHEST PAIN. MAX 3 TABLETS IN 15 MINUTES   rosuvastatin  (CRESTOR ) 40 MG tablet TAKE 1 TABLET(40 MG) BY MOUTH AT BEDTIME   triamcinolone  cream (KENALOG ) 0.1 % Apply topically 2 (two) times daily as needed.   VITAMIN D  PO Take by mouth.   naproxen  (NAPROSYN ) 500 MG tablet Take 1 tablet (500 mg total) by mouth 2 (two) times daily with a meal. (Patient not taking: Reported on 11/25/2023)   valACYclovir  (VALTREX ) 1000 MG tablet Take 1 tablet (1,000 mg total) by mouth 3 (three) times daily. (Patient not taking: Reported on 09/26/2023)   No current facility-administered medications for this visit. (  Other)   REVIEW OF SYSTEMS: ROS   Positive for: Gastrointestinal, Endocrine, Eyes Last edited by Sylvan Evener, COA on 11/25/2023  9:06 AM.      ALLERGIES Allergies  Allergen Reactions   Bee Venom Swelling   Tape Rash   Tuberculin Tests Other (See Comments)   PAST MEDICAL HISTORY Past Medical History:  Diagnosis Date   Aortic atherosclerosis (HCC) 06/10/2017   CAD (coronary artery disease)    Cancer (HCC)    Combined form of age-related cataract, both eyes 04/07/2017   Dermatochalasis of  both eyelids 04/07/2017   Diabetes mellitus without complication (HCC)    GERD (gastroesophageal reflux disease)    Hypertension    Hypothyroidism    Lacunar infarction (HCC) 07/20/2018   Left renal mass 10/04/2018   1.7 cm hypoechoic mass 10/04/2018   Lumbar degenerative disc disease 03/09/2017   Myocardial infarction (HCC) 2005   Normocytic anemia 12/14/2018   Palpitations    Papillary thyroid  carcinoma (HCC)    Posterior vitreous detachment, right eye 04/07/2017   Past Surgical History:  Procedure Laterality Date   TOTAL THYROIDECTOMY  2016   FAMILY HISTORY Family History  Problem Relation Age of Onset   Hypertension Mother    Hypertension Sister    Hypertension Brother    Breast cancer Daughter    Hypertension Sister    SOCIAL HISTORY Social History   Tobacco Use   Smoking status: Former    Passive exposure: Never   Smokeless tobacco: Never  Vaping Use   Vaping status: Never Used  Substance Use Topics   Alcohol use: No   Drug use: No       OPHTHALMIC EXAM:  Base Eye Exam     Visual Acuity (Snellen - Linear)       Right Left   Dist [place] 20/20 20/20         Tonometry (Tonopen, 9:04 AM)       Right Left   Pressure 18 18         Pupils       Dark Light Shape React APD   Right 3 2 Round Brisk None   Left 3 2 Round Brisk None         Visual Fields (Counting fingers)       Left Right    Full Full         Extraocular Movement       Right Left    Full, Ortho Full, Ortho         Neuro/Psych     Oriented x3: Yes   Mood/Affect: Normal         Dilation     Both eyes: 1.0% Mydriacyl, 2.5% Phenylephrine @ 9:03 AM           Slit Lamp and Fundus Exam     Slit Lamp Exam       Right Left   Lids/Lashes Dermatochalasis - upper lid Dermatochalasis - upper lid   Conjunctiva/Sclera mild melanosis mild melanosis   Cornea well healed cataract wound, arcus arcus, 1-2+ inferior Punctate epithelial erosions, well healed cataract  wound   Anterior Chamber deep and clear deep and clear   Iris Round and dilated Round and dilated   Lens PC IOL in good position PC IOL in good position   Anterior Vitreous mild syneresis mild syneresis         Fundus Exam       Right Left   Disc Pink and Sharp, mild  PPA Pink and Sharp   C/D Ratio 0.1 0.2   Macula Flat, Good foveal reflex, RPE mottling, No heme or edema Flat, Good foveal reflex, RPE mottling, No heme or edema   Vessels mild attenuation, mild tortuosity mild attenuation, mild tortuosity   Periphery Attached, mild midzonal drusen, No heme Attached, mild midzonal drusen, No heme           IMAGING AND PROCEDURES  Imaging and Procedures for 11/25/2023  OCT, Retina - OU - Both Eyes       Right Eye Quality was good. Central Foveal Thickness: 283. Progression has no prior data. Findings include normal foveal contour, no IRF, no SRF.   Left Eye Quality was good. Central Foveal Thickness: 281. Progression has no prior data. Findings include normal foveal contour, no IRF, no SRF.   Notes *Images captured and stored on drive  Diagnosis / Impression:  NFP, no IRF/SRF OU No DME OU  Clinical management:  See below  Abbreviations: NFP - Normal foveal profile. CME - cystoid macular edema. PED - pigment epithelial detachment. IRF - intraretinal fluid. SRF - subretinal fluid. EZ - ellipsoid zone. ERM - epiretinal membrane. ORA - outer retinal atrophy. ORT - outer retinal tubulation. SRHM - subretinal hyper-reflective material. IRHM - intraretinal hyper-reflective material            ASSESSMENT/PLAN:   ICD-10-CM   1. 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 Long term (current) use of oral hypoglycemic drugs  Z79.84     3. Long-term (current) use of injectable non-insulin  antidiabetic drugs  Z79.85     4. Hypertensive retinopathy of both eyes  H35.033     5. Essential hypertension  I10     6. Pseudophakia, both eyes   Z96.1      1-3. Diabetes mellitus, type 2 without retinopathy  - A1C 6.4 (04.11.25) - The incidence, risk factors for progression, natural history and treatment options for diabetic retinopathy  were discussed with patient.   - The need for close monitoring of blood glucose, blood pressure, and serum lipids, avoiding cigarette or any type of tobacco, and the need for long term follow up was also discussed with patient. - f/u in 1 year, sooner prn  4,5. Hypertensive retinopathy OU - discussed importance of tight BP control - monitor  6. Pseudophakia OU  - s/p CE/IOL OU  - IOL in good position, doing well  - monitor   Ophthalmic Meds Ordered this visit:  No orders of the defined types were placed in this encounter.    No follow-ups on file.  There are no Patient Instructions on file for this visit.  This document serves as a record of services personally performed by Jeanice Millard, MD, PhD. It was created on their behalf by Eller Gut COT, an ophthalmic technician. The creation of this record is the provider's dictation and/or activities during the visit.    Electronically signed by: Eller Gut COT 04.30.25 9:26 AM  This document serves as a record of services personally performed by Jeanice Millard, MD, PhD. It was created on their behalf by Olene Berne, COT an ophthalmic technician. The creation of this record is the provider's dictation and/or activities during the visit.    Electronically signed by:  Olene Berne, COT  11/25/23 9:27 AM   Abbreviations: M myopia (nearsighted); A astigmatism; H hyperopia (farsighted); P presbyopia; Mrx spectacle prescription;  CTL contact  lenses; OD right eye; OS left eye; OU both eyes  XT exotropia; ET esotropia; PEK punctate epithelial keratitis; PEE punctate epithelial erosions; DES dry eye syndrome; MGD meibomian gland dysfunction; ATs artificial tears; PFAT's preservative free artificial tears; NSC nuclear  sclerotic cataract; PSC posterior subcapsular cataract; ERM epi-retinal membrane; PVD posterior vitreous detachment; RD retinal detachment; DM diabetes mellitus; DR diabetic retinopathy; NPDR non-proliferative diabetic retinopathy; PDR proliferative diabetic retinopathy; CSME clinically significant macular edema; DME diabetic macular edema; dbh dot blot hemorrhages; CWS cotton wool spot; POAG primary open angle glaucoma; C/D cup-to-disc ratio; HVF humphrey visual field; GVF goldmann visual field; OCT optical coherence tomography; IOP intraocular pressure; BRVO Branch retinal vein occlusion; CRVO central retinal vein occlusion; CRAO central retinal artery occlusion; BRAO branch retinal artery occlusion; RT retinal tear; SB scleral buckle; PPV pars plana vitrectomy; VH Vitreous hemorrhage; PRP panretinal laser photocoagulation; IVK intravitreal kenalog ; VMT vitreomacular traction; MH Macular hole;  NVD neovascularization of the disc; NVE neovascularization elsewhere; AREDS age related eye disease study; ARMD age related macular degeneration; POAG primary open angle glaucoma; EBMD epithelial/anterior basement membrane dystrophy; ACIOL anterior chamber intraocular lens; IOL intraocular lens; PCIOL posterior chamber intraocular lens; Phaco/IOL phacoemulsification with intraocular lens placement; PRK photorefractive keratectomy; LASIK laser assisted in situ keratomileusis; HTN hypertension; DM diabetes mellitus; COPD chronic obstructive pulmonary disease

## 2023-11-25 ENCOUNTER — Ambulatory Visit (INDEPENDENT_AMBULATORY_CARE_PROVIDER_SITE_OTHER): Payer: 59 | Admitting: Ophthalmology

## 2023-11-25 ENCOUNTER — Encounter (INDEPENDENT_AMBULATORY_CARE_PROVIDER_SITE_OTHER): Payer: Self-pay | Admitting: Ophthalmology

## 2023-11-25 DIAGNOSIS — H35033 Hypertensive retinopathy, bilateral: Secondary | ICD-10-CM

## 2023-11-25 DIAGNOSIS — Z7984 Long term (current) use of oral hypoglycemic drugs: Secondary | ICD-10-CM

## 2023-11-25 DIAGNOSIS — Z961 Presence of intraocular lens: Secondary | ICD-10-CM | POA: Diagnosis not present

## 2023-11-25 DIAGNOSIS — Z7985 Long-term (current) use of injectable non-insulin antidiabetic drugs: Secondary | ICD-10-CM

## 2023-11-25 DIAGNOSIS — E119 Type 2 diabetes mellitus without complications: Secondary | ICD-10-CM

## 2023-11-25 DIAGNOSIS — I10 Essential (primary) hypertension: Secondary | ICD-10-CM | POA: Diagnosis not present

## 2023-11-25 LAB — HM DIABETES EYE EXAM

## 2023-11-27 ENCOUNTER — Encounter (INDEPENDENT_AMBULATORY_CARE_PROVIDER_SITE_OTHER): Payer: Self-pay | Admitting: Ophthalmology

## 2023-11-28 ENCOUNTER — Encounter: Payer: Self-pay | Admitting: Family Medicine

## 2023-12-07 ENCOUNTER — Other Ambulatory Visit: Payer: Self-pay | Admitting: Family Medicine

## 2023-12-07 DIAGNOSIS — E1169 Type 2 diabetes mellitus with other specified complication: Secondary | ICD-10-CM

## 2023-12-09 ENCOUNTER — Other Ambulatory Visit: Payer: Self-pay | Admitting: Family Medicine

## 2023-12-09 MED ORDER — DAPAGLIFLOZIN PROPANEDIOL 5 MG PO TABS: 5.0000 mg | ORAL_TABLET | Freq: Every day | ORAL | 1 refills | Status: DC

## 2023-12-09 NOTE — Telephone Encounter (Signed)
 Copied from CRM [id number]. Topic: Clinical - Medication Refill >> Dec 09, 2023 12:03 PM Rachelle R wrote: Medication: dapagliflozin  propanediol (FARXIGA ) 5 MG TABS tablet  Has the patient contacted their pharmacy? Yes - contact Dr office  This is the patient's preferred pharmacy:  [id number] - [place], [place] - [address] AT [facility] OF [address] [place] [address] Phone: [phone] Fax: [phone]  Is this the correct pharmacy for this prescription? Yes If no, delete pharmacy and type the correct one.   Has the prescription been filled recently? No  Is the patient out of the medication? Yes  Has the patient been seen for an appointment in the last year OR does the patient have an upcoming appointment? Yes  Can we respond through MyChart? No Patient can be reached at [phone]  Agent: Please be advised that Rx refills may take up to 3 business days. We ask that you follow-up with your pharmacy.

## 2023-12-26 DIAGNOSIS — L851 Acquired keratosis [keratoderma] palmaris et plantaris: Secondary | ICD-10-CM | POA: Diagnosis not present

## 2023-12-26 DIAGNOSIS — B351 Tinea unguium: Secondary | ICD-10-CM | POA: Diagnosis not present

## 2023-12-26 DIAGNOSIS — E1142 Type 2 diabetes mellitus with diabetic polyneuropathy: Secondary | ICD-10-CM | POA: Diagnosis not present

## 2024-01-03 ENCOUNTER — Ambulatory Visit: Payer: Self-pay

## 2024-01-03 NOTE — Telephone Encounter (Signed)
 Pt booked with Sara Gardner for Friday. Can we call this pt to see if she wants to see me, her PCP before Friday?

## 2024-01-03 NOTE — Telephone Encounter (Signed)
 Patient has scheduled an appointment with Dr.Rucker for 01/04/2024 at 1:50. Canceling her appointment with Sara Gardner.

## 2024-01-03 NOTE — Telephone Encounter (Signed)
 FYI Only or Action Required?: FYI only for provider  Patient was last seen in primary care on 11/04/2023 by Manette Section, MD. Called Nurse Triage reporting Injury. Symptoms began several days ago. Interventions attempted: Nothing. Symptoms are: stable.  Triage Disposition: See PCP When Office is Open (Within 3 Days)- Scheduled  Patient/caregiver understands and will follow disposition?: yes        Copied from CRM [id number]. Topic: Clinical - Red Word Triage >> Jan 03, 2024  9:40 AM Baldomero Bone wrote: Red Word that prompted transfer to Nurse Triage: Patient was in [place] and a soda fell and hit her arm on 12/29/2023. Patient is now bruised on her right arm, and it is getting worse. Bruise is getting darker A small bruise is also on her right breast. Callback number is [phone] Reason for Disposition  [1] After 10 days AND [2] bruise not fading    Bruise getting bigger. Pt on Daily Aspirin 81mg .  Answer Assessment - Initial Assessment Questions 1. APPEARANCE of BRUISE: "Describe the bruise."      ---- R. Arm: Pain scale: 7/10: -------- Breast: Pain scale 0/10    2. SIZE: "How large is the bruise?"      ------------- Arm: 50Cent sized. ----------------- Breast: Smaller than a penny   3. NUMBER: "How many bruises are there?"      -----------2   4. LOCATION: "Where is the bruise located?"      --------see answer one    5. ONSET: "How long ago did the bruise occur?"      ----- 12/29/23   6. CAUSE: "Tell me how it happened."      ---- Tried to reached something on the top shelf at [employer]. Soda fell on top of her.   7. MEDICAL HISTORY: "Do you have any medical problems that can cause easy bruising or bleeding?" (e.g., leukemia, liver disease, recent chemotherapy)     ---------------- Denies   8. MEDICINES: "Do you take any medications which thin the blood such as: aspirin, heparin, ibuprofen (NSAIDS), Plavix, or Coumadin?"     -------------- Aspirin 81 mg, daily   9.  OTHER SYMPTOMS: "Do you have any other symptoms?"  (e.g., weakness, dizziness, pain, fever, nosebleed, blood in urine/stool)     --------------- Denies     Additional Inform: -No pain meds - Can lift and move the arm appropriately - Reports the " burise is getting darker and bigger daily" -  Protocols used: Bruises-A-AH

## 2024-01-03 NOTE — Telephone Encounter (Signed)
 Patient didn't answer, Lvm for patient to call back.

## 2024-01-04 ENCOUNTER — Ambulatory Visit (INDEPENDENT_AMBULATORY_CARE_PROVIDER_SITE_OTHER): Admitting: Family Medicine

## 2024-01-04 ENCOUNTER — Encounter: Payer: Self-pay | Admitting: Family Medicine

## 2024-01-04 VITALS — BP 100/68 | HR 75 | Temp 98.0°F | Resp 18 | Ht 63.0 in | Wt 164.2 lb

## 2024-01-04 DIAGNOSIS — R300 Dysuria: Secondary | ICD-10-CM

## 2024-01-04 DIAGNOSIS — T148XXA Other injury of unspecified body region, initial encounter: Secondary | ICD-10-CM | POA: Diagnosis not present

## 2024-01-04 DIAGNOSIS — R81 Glycosuria: Secondary | ICD-10-CM | POA: Diagnosis not present

## 2024-01-04 LAB — POCT URINALYSIS DIP (CLINITEK)
Bilirubin, UA: NEGATIVE
Blood, UA: NEGATIVE
Glucose, UA: >=1000 mg/dL — AB
Ketones, POC UA: NEGATIVE mg/dL
Leukocytes, UA: NEGATIVE
Nitrite, UA: NEGATIVE
POC PROTEIN,UA: NEGATIVE
Spec Grav, UA: 1.01 (ref 1.010–1.025)
Urobilinogen, UA: 0.2 U/dL
pH, UA: 5.5 (ref 5.0–8.0)

## 2024-01-04 NOTE — Progress Notes (Signed)
 Acute Office Visit  Subjective:     Patient ID: Sara Gardner, female    DOB: 11/17/1950, 73 y.o.   MRN: [id number]  Chief Complaint  Patient presents with   bruise    Patient states that last Thursday while shopping she was reaching up to get something off of a high shelf and the product fell and hit her right arm side of her right breast. She has a bruise on her right arm that she states its getting bigger and hurts. She states that the right breast is not hurting    Urinary Tract Infection    Patient has complaints of what she thinks may be a UTI she states that her symptoms started yesterday with burning while urinating patient denies any lower abdominal pain or lower back pain.     Urinary Tract Infection  Associated symptoms include urgency.  Patient is in today for acute visit.  Pt reports she was in [employer] and was trying to pick up a case of soda. It fell on her arm and caused a bruise on her arm.  She also reports some dysuria. She says it started yesterday. No flank pain. She has some urinary urgency.   Review of Systems  Genitourinary:  Positive for dysuria and urgency.  Endo/Heme/Allergies:        Bruise  All other systems reviewed and are negative.      Objective:    BP 100/68   Pulse 75   Temp 98 F (36.7 C) (Oral)   Resp 18   Ht 5' 3 (1.6 m)   Wt 164 lb 3.2 oz (74.5 kg)   SpO2 97%   BMI 29.09 kg/m    Physical Exam Vitals and nursing note reviewed.  Constitutional:      Appearance: Normal appearance. She is normal weight.  HENT:     Head: Normocephalic and atraumatic.     Right Ear: External ear normal.     Left Ear: External ear normal.     Nose: Nose normal.     Mouth/Throat:     Mouth: Mucous membranes are moist.     Pharynx: Oropharynx is clear.  Eyes:     Conjunctiva/sclera: Conjunctivae normal.     Pupils: Pupils are equal, round, and reactive to light.  Cardiovascular:     Rate and Rhythm: Normal rate and regular rhythm.      Pulses: Normal pulses.     Heart sounds: Normal heart sounds.  Pulmonary:     Effort: Pulmonary effort is normal.     Breath sounds: Normal breath sounds.  Abdominal:     General: Abdomen is flat. Bowel sounds are normal.  Skin:    General: Skin is warm.     Capillary Refill: Capillary refill takes less than 2 seconds.     Findings: Bruising present.  Neurological:     General: No focal deficit present.     Mental Status: She is alert and oriented to person, place, and time. Mental status is at baseline.  Psychiatric:        Mood and Affect: Mood normal.        Behavior: Behavior normal.        Thought Content: Thought content normal.        Judgment: Judgment normal.   Results for orders placed or performed in visit on 01/04/24  POCT URINALYSIS DIP (CLINITEK)  Result Value Ref Range   Color, UA yellow yellow   Clarity, UA clear clear  Glucose, UA >=1,000 (A) negative mg/dL   Bilirubin, UA negative negative   Ketones, POC UA negative negative mg/dL   Spec Grav, UA 8.295 6.213 - 1.025   Blood, UA negative negative   pH, UA 5.5 5.0 - 8.0   POC PROTEIN,UA negative negative, trace   Urobilinogen, UA 0.2 0.2 or 1.0 E.U./dL   Nitrite, UA Negative Negative   Leukocytes, UA Negative Negative        Assessment & Plan:   Problem List Items Addressed This Visit   None Visit Diagnoses       Dysuria    -  Primary   Relevant Orders   POCT URINALYSIS DIP (CLINITEK) (Completed)     Glucosuria         Bruise           No orders of the defined types were placed in this encounter. Dysuria -     POCT URINALYSIS DIP (CLINITEK)  Glucosuria  Bruise   Pt with dysuria. Urine dip negative except for glucose. Last A1c 6.4 monitor sugars at home and report any findings above 200 Pt was advised on home care instructions for bruising.   Return in about 4 months (around 05/05/2024) for Chronic condition follow up.  Manette Section, MD

## 2024-01-06 ENCOUNTER — Ambulatory Visit: Admitting: Family Medicine

## 2024-01-10 ENCOUNTER — Other Ambulatory Visit: Payer: Self-pay | Admitting: Family Medicine

## 2024-01-11 ENCOUNTER — Telehealth: Payer: Self-pay | Admitting: Family Medicine

## 2024-01-11 MED ORDER — AMMONIUM LACTATE 12 % EX LOTN: TOPICAL_LOTION | CUTANEOUS | 0 refills | Status: AC

## 2024-01-11 NOTE — Telephone Encounter (Signed)
 Copied from CRM [id number]. Topic: Clinical - Medication Refill >> Jan 11, 2024  1:16 PM Allyne Areola wrote: Medication: ammonium lactate  (LAC-HYDRIN ) 12 % lotion [[id number]]  Has the patient contacted their pharmacy? No (Agent: If no, request that the patient contact the pharmacy for the refill. If patient does not wish to contact the pharmacy document the reason why and proceed with request.) (Agent: If yes, when and what did the pharmacy advise?)  This is the patient's preferred pharmacy:  [id number] - [place], [place] - [address] AT [facility] OF [address] [place] [address] Phone: [phone] Fax: [phone]  Is this the correct pharmacy for this prescription? Yes If no, delete pharmacy and type the correct one.   Has the prescription been filled recently? No  Is the patient out of the medication? Yes  Has the patient been seen for an appointment in the last year OR does the patient have an upcoming appointment? Yes  Can we respond through MyChart? Yes  Agent: Please be advised that Rx refills may take up to 3 business days. We ask that you follow-up with your pharmacy.

## 2024-01-26 DIAGNOSIS — C73 Malignant neoplasm of thyroid gland: Secondary | ICD-10-CM | POA: Diagnosis not present

## 2024-01-26 DIAGNOSIS — E890 Postprocedural hypothyroidism: Secondary | ICD-10-CM | POA: Diagnosis not present

## 2024-02-02 DIAGNOSIS — C73 Malignant neoplasm of thyroid gland: Secondary | ICD-10-CM | POA: Diagnosis not present

## 2024-02-02 DIAGNOSIS — E890 Postprocedural hypothyroidism: Secondary | ICD-10-CM | POA: Diagnosis not present

## 2024-03-27 ENCOUNTER — Encounter: Payer: Self-pay | Admitting: Sports Medicine

## 2024-03-29 ENCOUNTER — Ambulatory Visit: Payer: Self-pay

## 2024-03-29 DIAGNOSIS — L851 Acquired keratosis [keratoderma] palmaris et plantaris: Secondary | ICD-10-CM | POA: Diagnosis not present

## 2024-03-29 DIAGNOSIS — E1142 Type 2 diabetes mellitus with diabetic polyneuropathy: Secondary | ICD-10-CM | POA: Diagnosis not present

## 2024-03-29 DIAGNOSIS — B351 Tinea unguium: Secondary | ICD-10-CM | POA: Diagnosis not present

## 2024-03-29 NOTE — Telephone Encounter (Signed)
 FYI Only or Action Required?: FYI only for provider.  Patient was last seen in primary care on 01/04/2024 by Colette Torrence GRADE, MD.  Called Nurse Triage reporting Leg Swelling.  Symptoms began several months ago.  Interventions attempted: Nothing.  Symptoms are: gradually worsening.  Triage Disposition: See Physician Within 24 Hours  Patient/caregiver understands and will follow disposition?: Yes    Copied from CRM #[id number]. Topic: Clinical - Red Word Triage >> Mar 29, 2024 12:12 PM Sara Gardner wrote: Pt having trouble walking don't know it's arthritis, and swelling Reason for Disposition  [1] MODERATE leg swelling (e.g., swelling extends up to knees) AND [2] new-onset or getting worse  Answer Assessment - Initial Assessment Questions 1. ONSET: When did the swelling start? (e.g., minutes, hours, days)     Off and on all the time but feels its getting worse 2. LOCATION: What part of the leg is swollen?  Are both legs swollen or just one leg?     Both legs and wears support hose 3. SEVERITY: How bad is the swelling? (e.g., localized; mild, moderate, severe)     severe 4. REDNESS: Is there redness or signs of infection?     Denies but hot to the touch 5. PAIN: Is the swelling painful to touch? If Yes, ask: How painful is it?   (Scale 1-10; mild, moderate or severe)     severe 6. FEVER: Do you have a fever? If Yes, ask: What is it, how was it measured, and when did it start?      no 7. CAUSE: What do you think is causing the leg swelling?     CHF 8. MEDICAL HISTORY: Do you have a history of blood clots (e.g., DVT), cancer, heart failure, kidney disease, or liver failure?     CHF, wt is 160, cysts to kidney 9. RECURRENT SYMPTOM: Have you had leg swelling before? If Yes, ask: When was the last time? What happened that time?     yes 10. OTHER SYMPTOMS: Do you have any other symptoms? (e.g., chest pain, difficulty breathing)      no 11. PREGNANCY: Is  there any chance you are pregnant? When was your last menstrual period?       na  Protocols used: Leg Swelling and Edema-A-AH

## 2024-03-30 ENCOUNTER — Other Ambulatory Visit: Payer: Self-pay | Admitting: Family Medicine

## 2024-03-30 ENCOUNTER — Encounter: Payer: Self-pay | Admitting: Family Medicine

## 2024-03-30 ENCOUNTER — Ambulatory Visit: Admitting: Family Medicine

## 2024-03-30 ENCOUNTER — Ambulatory Visit (INDEPENDENT_AMBULATORY_CARE_PROVIDER_SITE_OTHER): Admitting: Family Medicine

## 2024-03-30 VITALS — BP 95/61 | HR 72 | Temp 98.1°F | Resp 18 | Ht 63.0 in | Wt 164.8 lb

## 2024-03-30 DIAGNOSIS — Z23 Encounter for immunization: Secondary | ICD-10-CM

## 2024-03-30 DIAGNOSIS — M25561 Pain in right knee: Secondary | ICD-10-CM

## 2024-03-30 DIAGNOSIS — G8929 Other chronic pain: Secondary | ICD-10-CM | POA: Diagnosis not present

## 2024-03-30 DIAGNOSIS — M25562 Pain in left knee: Secondary | ICD-10-CM | POA: Diagnosis not present

## 2024-03-30 MED ORDER — DICLOFENAC SODIUM 1 % EX GEL: 2.0000 g | Freq: Four times a day (QID) | CUTANEOUS | 1 refills | Status: AC | PRN

## 2024-03-30 NOTE — Progress Notes (Signed)
 Established Patient Office Visit  Subjective   Patient ID: Sara Gardner, female    DOB: [birth year]  Age: 73 y.o. MRN: [id number]  Chief Complaint  Patient presents with   Knee Pain    Patient is here to discuss her knee pain that she has been having for a while now. She states that both knees will swell up and give her pain and feel as though they may give out.     Knee Pain     Knee pain For many years, she's had issues with her knees. She's had PT this year for her knees. She's also had knee injection x 1 many years ago but it hurt so bad that she declines further injections at this time. She says it depends on how she walks, she has knee pain. Right knee is the worse. She says the pain is so bad it feels like her leg is going out.   Pt would like flu vaccine.  Review of Systems  Musculoskeletal:  Positive for joint pain.  All other systems reviewed and are negative.     Objective:     BP 95/61   Pulse 72   Temp 98.1 F (36.7 C) (Oral)   Resp 18   Ht 5' 3 (1.6 m)   Wt 164 lb 12.8 oz (74.8 kg)   SpO2 100%   BMI 29.19 kg/m  BP Readings from Last 3 Encounters:  03/30/24 95/61  01/04/24 100/68  11/04/23 117/72      Physical Exam Vitals and nursing note reviewed.  Constitutional:      Appearance: Normal appearance. She is normal weight.  HENT:     Head: Normocephalic and atraumatic.     Right Ear: External ear normal.     Left Ear: External ear normal.     Nose: Nose normal.     Mouth/Throat:     Mouth: Mucous membranes are moist.     Pharynx: Oropharynx is clear.  Eyes:     Conjunctiva/sclera: Conjunctivae normal.     Pupils: Pupils are equal, round, and reactive to light.  Cardiovascular:     Rate and Rhythm: Normal rate.  Pulmonary:     Effort: Pulmonary effort is normal.  Skin:    General: Skin is warm.     Capillary Refill: Capillary refill takes less than 2 seconds.  Neurological:     General: No focal deficit present.     Mental  Status: She is alert and oriented to person, place, and time. Mental status is at baseline.  Psychiatric:        Mood and Affect: Mood normal.        Behavior: Behavior normal.        Thought Content: Thought content normal.        Judgment: Judgment normal.      No results found for any visits on 03/30/24.     The ASCVD Risk score (Arnett DK, et al., 2019) failed to calculate for the following reasons:   Risk score cannot be calculated because patient has a medical history suggesting prior/existing ASCVD    Assessment & Plan:   Problem List Items Addressed This Visit   None Visit Diagnoses       Chronic pain of both knees    -  Primary   Relevant Medications   diclofenac  Sodium (VOLTAREN  ARTHRITIS PAIN) 1 % GEL   Other Relevant Orders   Ambulatory referral to Orthopedic Surgery     Need for  influenza vaccination       Relevant Orders   Flu vaccine HIGH DOSE PF(Fluzone Trivalent)      Chronic pain of both knees -     Ambulatory referral to Orthopedic Surgery -     Diclofenac  Sodium; Apply 2 g topically 4 (four) times daily as needed.  Dispense: 100 g; Refill: 1  Need for influenza vaccination -     Flu vaccine HIGH DOSE PF(Fluzone Trivalent)   Pt with chronic knee pain. Refer to Ortho for further evaluation. Send in Voltaren  gel to use 4 times a day prn for pain.  Flu vaccine today See in 6 months sooner prn.  No follow-ups on file.    Torrence CINDERELLA Barrier, MD

## 2024-04-02 ENCOUNTER — Other Ambulatory Visit: Payer: Self-pay | Admitting: Family Medicine

## 2024-04-02 ENCOUNTER — Telehealth: Payer: Self-pay

## 2024-04-02 MED ORDER — DAPAGLIFLOZIN PROPANEDIOL 5 MG PO TABS: 5.0000 mg | ORAL_TABLET | Freq: Every day | ORAL | 1 refills | Status: AC

## 2024-04-02 NOTE — Telephone Encounter (Signed)
 Patient was is aware.

## 2024-04-02 NOTE — Telephone Encounter (Signed)
 Patient called and stated that she would like that we send her Rx over to get a refill on the following: She has no pills left.    dapagliflozin  propanediol (FARXIGA ) 5 MG TABS tablet   The pharmacy is correct.   Pt also stated that she received the flu shot and her arm is swollen and it hurts to lift it. Please advise?

## 2024-04-02 NOTE — Telephone Encounter (Signed)
 Refilled Farxiga  Can use ice packs and take tylenol  as needed for pain.

## 2024-04-06 ENCOUNTER — Ambulatory Visit (INDEPENDENT_AMBULATORY_CARE_PROVIDER_SITE_OTHER): Admitting: Orthopaedic Surgery

## 2024-04-06 ENCOUNTER — Ambulatory Visit (INDEPENDENT_AMBULATORY_CARE_PROVIDER_SITE_OTHER)

## 2024-04-06 DIAGNOSIS — M170 Bilateral primary osteoarthritis of knee: Secondary | ICD-10-CM

## 2024-04-06 DIAGNOSIS — M25562 Pain in left knee: Secondary | ICD-10-CM | POA: Diagnosis not present

## 2024-04-06 DIAGNOSIS — M25561 Pain in right knee: Secondary | ICD-10-CM | POA: Diagnosis not present

## 2024-04-06 DIAGNOSIS — M1712 Unilateral primary osteoarthritis, left knee: Secondary | ICD-10-CM | POA: Diagnosis not present

## 2024-04-06 DIAGNOSIS — M1711 Unilateral primary osteoarthritis, right knee: Secondary | ICD-10-CM | POA: Diagnosis not present

## 2024-04-06 NOTE — Progress Notes (Signed)
 Chief Complaint: Knee pain     History of Present Illness:    Sara Gardner is a 73 y.o. female presents today with ongoing bilateral chronic knee pain.  There has been crepitus and popping in the knee.  She is currently retired.  She does have a history of injections although these were quite significantly painful and as a result she has not gotten any additional injections.  She has been attempting to stay active.  She has been utilizing a knee sleeve on both knees    PMH/PSH/Family History/Social History/Meds/Allergies:    Past Medical History:  Diagnosis Date   Aortic atherosclerosis (HCC) 06/10/2017   CAD (coronary artery disease)    Cancer (HCC)    Combined form of age-related cataract, both eyes 04/07/2017   Dermatochalasis of both eyelids 04/07/2017   Diabetes mellitus without complication (HCC)    GERD (gastroesophageal reflux disease)    Hypertension    Hypothyroidism    Lacunar infarction (HCC) 07/20/2018   Left renal mass 10/04/2018   1.7 cm hypoechoic mass 10/04/2018   Lumbar degenerative disc disease 03/09/2017   Myocardial infarction (HCC) 2005   Normocytic anemia 12/14/2018   Palpitations    Papillary thyroid  carcinoma (HCC)    Posterior vitreous detachment, right eye 04/07/2017   Past Surgical History:  Procedure Laterality Date   TOTAL THYROIDECTOMY  2016   Social History   Socioeconomic History   Marital status: Divorced    Spouse name: Not on file   Number of children: 1   Years of education: 16   Highest education level: Bachelor's degree (e.g., BA, AB, BS)  Occupational History   Occupation: Retired.  Tobacco Use   Smoking status: Former    Passive exposure: Never   Smokeless tobacco: Never  Vaping Use   Vaping status: Never Used  Substance and Sexual Activity   Alcohol use: No   Drug use: No   Sexual activity: Not Currently  Other Topics Concern   Not on file  Social History Narrative   Lives alone. She has one daughter  who lives close by. She enjoys dancing, playing cards and travel.    She got her BA and minor in marketing from [employer] in 2013   Social Drivers of Health   Financial Resource Strain: Low Risk  (09/26/2023)   Overall Financial Resource Strain (CARDIA)    Difficulty of Paying Living Expenses: Not hard at all  Recent Concern: Financial Resource Strain - Medium Risk (08/04/2023)   Received from [facility]   Overall Financial Resource Strain (CARDIA)    Difficulty of Paying Living Expenses: Somewhat hard  Food Insecurity: No Food Insecurity (09/26/2023)   Hunger Vital Sign    Worried About Running Out of Food in the Last Year: Never true    Ran Out of Food in the Last Year: Never true  Transportation Needs: No Transportation Needs (09/26/2023)   PRAPARE - Administrator, Civil Service (Medical): No    Lack of Transportation (Non-Medical): No  Physical Activity: Insufficiently Active (09/26/2023)   Exercise Vital Sign    Days of Exercise per Week: 2 days    Minutes of Exercise per Session: 50 min  Stress: No Stress Concern Present (09/26/2023)   [employer] of Occupational Health - Occupational Stress Questionnaire    Feeling of Stress : Only a little  Social Connections: Moderately Integrated (09/26/2023)   Social Connection and Isolation Panel    Frequency of Communication with Friends  and Family: More than three times a week    Frequency of Social Gatherings with Friends and Family: Three times a week    Attends Religious Services: More than 4 times per year    Active Member of Clubs or Organizations: Yes    Attends Engineer, structural: More than 4 times per year    Marital Status: Divorced   Family History  Problem Relation Age of Onset   Hypertension Mother    Hypertension Sister    Hypertension Brother    Breast cancer Daughter    Hypertension Sister    Allergies  Allergen Reactions   Bee Venom Swelling   Tape Rash   Tuberculin Tests Other (See  Comments)   Current Outpatient Medications  Medication Sig Dispense Refill   Accu-Chek Softclix Lancets lancets Use as instructed. Accu-chek Guide 100 each 12   AMBULATORY NON FORMULARY MEDICATION Knee-high, medium compression, graduated compression stockings. Apply to lower extremities. [email or web address], Zippered Compression Stockings, medium circ, long length 1 each 0   ammonium lactate  (LAC-HYDRIN ) 12 % lotion APPLY TOPICALLY TO THE AFFECTED AREA AS NEEDED FOR DRY SKIN 400 g 0   ASPIRIN PO Take 81 mg by mouth daily.     blood glucose meter kit and supplies Dispense based on patient and insurance preference. Check daily. (FOR ICD-10 E10.9, E11.9). 1 each 0   dapagliflozin  propanediol (FARXIGA ) 5 MG TABS tablet Take 1 tablet (5 mg total) by mouth daily. 90 tablet 1   diclofenac  Sodium (VOLTAREN  ARTHRITIS PAIN) 1 % GEL Apply 2 g topically 4 (four) times daily as needed. 100 g 1   Dulaglutide  (TRULICITY ) 0.75 MG/0.5ML SOPN Inject 1.5 mg into the skin once a week. 12 mL 1   esomeprazole  (NEXIUM ) 20 MG capsule Take 1 capsule (20 mg total) by mouth daily at 12 noon. TAKE 1 CAPSULE BY MOUTH DAILY AT 12:00 NOON 90 capsule 1   fluticasone  (FLONASE ) 50 MCG/ACT nasal spray Place into both nostrils as needed for allergies or rhinitis.     glucose blood (ACCU-CHEK GUIDE) test strip USE AS INSTRUCTED TO TEST ONCE DAILY 100 strip 1   isosorbide  mononitrate (IMDUR ) 60 MG 24 hr tablet TAKE 1 TABLET(60 MG) BY MOUTH DAILY 90 tablet 3   levothyroxine  (SYNTHROID ) 88 MCG tablet Take 88 mcg by mouth daily before breakfast.     lisinopril -hydrochlorothiazide  (ZESTORETIC ) 20-12.5 MG tablet TAKE 1 TABLET BY MOUTH DAILY 90 tablet 3   metFORMIN  (GLUCOPHAGE ) 1000 MG tablet TAKE 1 TABLET(1000 MG) BY MOUTH EVERY MORNING 90 tablet 1   metoprolol  succinate (TOPROL -XL) 50 MG 24 hr tablet TAKE 1 TABLET(50 MG) BY MOUTH DAILY WITH OR IMMEDIATELY FOLLOWING A MEAL 90 tablet 2   naproxen  (NAPROSYN ) 500 MG tablet Take 1 tablet  (500 mg total) by mouth 2 (two) times daily with a meal. 30 tablet 0   nitroGLYCERIN  (NITROSTAT ) 0.4 MG SL tablet DISSOLVE 1 TABLET UNDER THE TONGUE EVERY 5 MINUTES, AS NEEDED FOR CHEST PAIN. MAX 3 TABLETS IN 15 MINUTES 25 tablet 6   rosuvastatin  (CRESTOR ) 40 MG tablet TAKE 1 TABLET(40 MG) BY MOUTH AT BEDTIME 90 tablet 1   triamcinolone  cream (KENALOG ) 0.1 % Apply topically 2 (two) times daily as needed. 30 g 0   TRULICITY  1.5 MG/0.5ML SOAJ ADMINISTER 1.5 MG UNDER THE SKIN 1 TIME A WEEK 6 mL 5   valACYclovir  (VALTREX ) 1000 MG tablet Take 1 tablet (1,000 mg total) by mouth 3 (three) times daily. 21 tablet 0   VITAMIN D   PO Take by mouth.     No current facility-administered medications for this visit.   No results found.  Review of Systems:   A ROS was performed including pertinent positives and negatives as documented in the HPI.  Physical Exam :   Constitutional: NAD and appears stated age Neurological: Alert and oriented Psych: Appropriate affect and cooperative There were no vitals taken for this visit.   Comprehensive Musculoskeletal Exam:    Right knee with tricompartmental pain as well as positive crepitus range of motion bilaterally is 0 degrees to 120 degrees.   Imaging:   Xray (4 views right knee, 4 views left knee): Moderate tricompartmental osteoarthritis bilaterally     I personally reviewed and interpreted the radiographs.   Assessment and Plan:   73 y.o. female with evidence of moderate tricompartmental osteoarthritis bilaterally.  At this time I did describe that she would be a candidate for knee arthroplasty although she is hoping to defer this.  We did discuss about the possibility of a repeat visit for bilateral knee ultrasound-guided injections which she would like to proceed with  -Plan for return to office for bilateral knee ultrasound-guided injections   I personally saw and evaluated the patient, and participated in the management and treatment  plan.  Elspeth Parker, MD Attending Physician, Orthopedic Surgery  This document was dictated using Dragon voice recognition software. A reasonable attempt at proof reading has been made to minimize errors.

## 2024-04-17 ENCOUNTER — Other Ambulatory Visit: Payer: Self-pay | Admitting: Family Medicine

## 2024-04-17 DIAGNOSIS — E1169 Type 2 diabetes mellitus with other specified complication: Secondary | ICD-10-CM

## 2024-04-18 ENCOUNTER — Telehealth (HOSPITAL_BASED_OUTPATIENT_CLINIC_OR_DEPARTMENT_OTHER): Payer: Self-pay | Admitting: Orthopaedic Surgery

## 2024-04-18 NOTE — Telephone Encounter (Signed)
 Brace ready for pick up

## 2024-04-26 ENCOUNTER — Other Ambulatory Visit: Payer: Self-pay | Admitting: Cardiology

## 2024-04-26 ENCOUNTER — Other Ambulatory Visit: Payer: Self-pay | Admitting: Family Medicine

## 2024-04-26 DIAGNOSIS — K219 Gastro-esophageal reflux disease without esophagitis: Secondary | ICD-10-CM

## 2024-04-30 ENCOUNTER — Ambulatory Visit (INDEPENDENT_AMBULATORY_CARE_PROVIDER_SITE_OTHER): Admitting: Student

## 2024-04-30 DIAGNOSIS — M170 Bilateral primary osteoarthritis of knee: Secondary | ICD-10-CM | POA: Diagnosis not present

## 2024-04-30 DIAGNOSIS — M25561 Pain in right knee: Secondary | ICD-10-CM

## 2024-04-30 DIAGNOSIS — M25562 Pain in left knee: Secondary | ICD-10-CM | POA: Diagnosis not present

## 2024-04-30 MED ORDER — TRIAMCINOLONE ACETONIDE 40 MG/ML IJ SUSP
2.0000 mL | INTRAMUSCULAR | Status: AC | PRN
  Administered 2024-04-30: 2 mL via INTRA_ARTICULAR

## 2024-04-30 MED ORDER — LIDOCAINE HCL 1 % IJ SOLN
4.0000 mL | INTRAMUSCULAR | Status: AC | PRN
  Administered 2024-04-30: 4 mL

## 2024-04-30 NOTE — Progress Notes (Signed)
 Chief Complaint: Bilateral knee pain     History of Present Illness:    Sara Gardner is a 73 y.o. female who presents today for follow-up evaluation and possible injections of bilateral knees.  She does have known osteoarthritis and states that she is having consistent, moderate pain in both knees.  She is trying to stay as active as possible.  She has previously had injections although reports that this was many years ago.  Last A1c on 4/11 was 6.4.   PMH/PSH/Family History/Social History/Meds/Allergies:    Past Medical History:  Diagnosis Date   Aortic atherosclerosis 06/10/2017   CAD (coronary artery disease)    Cancer (HCC)    Combined form of age-related cataract, both eyes 04/07/2017   Dermatochalasis of both eyelids 04/07/2017   Diabetes mellitus without complication (HCC)    GERD (gastroesophageal reflux disease)    Hypertension    Hypothyroidism    Lacunar infarction (HCC) 07/20/2018   Left renal mass 10/04/2018   1.7 cm hypoechoic mass 10/04/2018   Lumbar degenerative disc disease 03/09/2017   Myocardial infarction (HCC) 2005   Normocytic anemia 12/14/2018   Palpitations    Papillary thyroid  carcinoma (HCC)    Posterior vitreous detachment, right eye 04/07/2017   Past Surgical History:  Procedure Laterality Date   TOTAL THYROIDECTOMY  2016   Social History   Socioeconomic History   Marital status: Divorced    Spouse name: Not on file   Number of children: 1   Years of education: 16   Highest education level: Bachelor's degree (e.g., BA, AB, BS)  Occupational History   Occupation: Retired.  Tobacco Use   Smoking status: Former    Passive exposure: Never   Smokeless tobacco: Never  Vaping Use   Vaping status: Never Used  Substance and Sexual Activity   Alcohol use: No   Drug use: No   Sexual activity: Not Currently  Other Topics Concern   Not on file  Social History Narrative   Lives alone. She has one daughter who lives close by. She  enjoys dancing, playing cards and travel.    She got her BA and minor in marketing from [employer] in 2013   Social Drivers of Health   Financial Resource Strain: Low Risk  (09/26/2023)   Overall Financial Resource Strain (CARDIA)    Difficulty of Paying Living Expenses: Not hard at all  Recent Concern: Financial Resource Strain - Medium Risk (08/04/2023)   Received from [facility]   Overall Financial Resource Strain (CARDIA)    Difficulty of Paying Living Expenses: Somewhat hard  Food Insecurity: No Food Insecurity (09/26/2023)   Hunger Vital Sign    Worried About Running Out of Food in the Last Year: Never true    Ran Out of Food in the Last Year: Never true  Transportation Needs: No Transportation Needs (09/26/2023)   PRAPARE - Administrator, Civil Service (Medical): No    Lack of Transportation (Non-Medical): No  Physical Activity: Insufficiently Active (09/26/2023)   Exercise Vital Sign    Days of Exercise per Week: 2 days    Minutes of Exercise per Session: 50 min  Stress: No Stress Concern Present (09/26/2023)   [employer] of Occupational Health - Occupational Stress Questionnaire    Feeling of Stress : Only a little  Social Connections: Moderately Integrated (09/26/2023)   Social Connection and Isolation Panel    Frequency of Communication with Friends and Family: More than three times a  week    Frequency of Social Gatherings with Friends and Family: Three times a week    Attends Religious Services: More than 4 times per year    Active Member of Clubs or Organizations: Yes    Attends Engineer, structural: More than 4 times per year    Marital Status: Divorced   Family History  Problem Relation Age of Onset   Hypertension Mother    Hypertension Sister    Hypertension Brother    Breast cancer Daughter    Hypertension Sister    Allergies  Allergen Reactions   Bee Venom Swelling   Tape Rash   Tuberculin Tests Other (See Comments)   Current  Outpatient Medications  Medication Sig Dispense Refill   Accu-Chek Softclix Lancets lancets Use as instructed. Accu-chek Guide 100 each 12   AMBULATORY NON FORMULARY MEDICATION Knee-high, medium compression, graduated compression stockings. Apply to lower extremities. [email or web address], Zippered Compression Stockings, medium circ, long length 1 each 0   ammonium lactate  (LAC-HYDRIN ) 12 % lotion APPLY TOPICALLY TO THE AFFECTED AREA AS NEEDED FOR DRY SKIN 400 g 0   ASPIRIN PO Take 81 mg by mouth daily.     blood glucose meter kit and supplies Dispense based on patient and insurance preference. Check daily. (FOR ICD-10 E10.9, E11.9). 1 each 0   dapagliflozin  propanediol (FARXIGA ) 5 MG TABS tablet Take 1 tablet (5 mg total) by mouth daily. 90 tablet 1   diclofenac  Sodium (VOLTAREN  ARTHRITIS PAIN) 1 % GEL Apply 2 g topically 4 (four) times daily as needed. 100 g 1   Dulaglutide  (TRULICITY ) 0.75 MG/0.5ML SOPN Inject 1.5 mg into the skin once a week. 12 mL 1   esomeprazole  (NEXIUM ) 20 MG capsule TAKE 1 CAPSULE(20 MG TOTAL) BY MOUTH DAILY AT 12:00PM 90 capsule 1   fluticasone  (FLONASE ) 50 MCG/ACT nasal spray Place into both nostrils as needed for allergies or rhinitis.     glucose blood (ACCU-CHEK GUIDE) test strip USE AS INSTRUCTED TO TEST ONCE DAILY 100 strip 1   isosorbide  mononitrate (IMDUR ) 60 MG 24 hr tablet TAKE 1 TABLET(60 MG) BY MOUTH DAILY 90 tablet 3   levothyroxine  (SYNTHROID ) 88 MCG tablet Take 88 mcg by mouth daily before breakfast.     lisinopril -hydrochlorothiazide  (ZESTORETIC ) 20-12.5 MG tablet TAKE 1 TABLET BY MOUTH DAILY 90 tablet 3   metFORMIN  (GLUCOPHAGE ) 1000 MG tablet TAKE 1 TABLET(1000 MG) BY MOUTH EVERY MORNING 90 tablet 1   metoprolol  succinate (TOPROL -XL) 50 MG 24 hr tablet TAKE 1 TABLET(50 MG) BY MOUTH DAILY WITH OR IMMEDIATELY FOLLOWING A MEAL 90 tablet 1   naproxen  (NAPROSYN ) 500 MG tablet Take 1 tablet (500 mg total) by mouth 2 (two) times daily with a meal. 30 tablet 0    nitroGLYCERIN  (NITROSTAT ) 0.4 MG SL tablet DISSOLVE 1 TABLET UNDER THE TONGUE EVERY 5 MINUTES, AS NEEDED FOR CHEST PAIN. MAX 3 TABLETS IN 15 MINUTES 25 tablet 6   rosuvastatin  (CRESTOR ) 40 MG tablet TAKE 1 TABLET(40 MG) BY MOUTH AT BEDTIME 90 tablet 1   triamcinolone  cream (KENALOG ) 0.1 % Apply topically 2 (two) times daily as needed. 30 g 0   TRULICITY  1.5 MG/0.5ML SOAJ ADMINISTER 1.5 MG UNDER THE SKIN 1 TIME A WEEK 6 mL 5   valACYclovir  (VALTREX ) 1000 MG tablet Take 1 tablet (1,000 mg total) by mouth 3 (three) times daily. 21 tablet 0   VITAMIN D  PO Take by mouth.     No current facility-administered medications for this visit.   No  results found.  Review of Systems:   A ROS was performed including pertinent positives and negatives as documented in the HPI.  Physical Exam :   Constitutional: NAD and appears stated age Neurological: Alert and oriented Psych: Appropriate affect and cooperative There were no vitals taken for this visit.   Comprehensive Musculoskeletal Exam:    Exam of bilateral knees demonstrates active range of motion from 0 to 120 degrees with crepitus.  No overlying erythema or warmth.  Stable collaterals with varus and valgus stress.  Imaging:     Assessment and Plan:   73 y.o. female with moderate to severe tricompartmental osteoarthritis.  She has been trying to manage this with activity although states that pain is affecting her daily activities.  Recommend that we trial injections into both knees today and discussed this procedure and medication in detail.  After consideration patient would like to proceed with this injections performed into both knees without complication which she tolerated very well.  Will plan to monitor her relief and have her return to clinic if symptoms begin to recur.      Procedure Note  Patient: Sara Gardner             Date of Birth: 08-05-1950           MRN: [id number]             Visit Date:  04/30/2024  Procedures: Visit Diagnoses:  1. Bilateral primary osteoarthritis of knee     Large Joint Inj: bilateral knee on 04/30/2024 12:57 PM Indications: pain Details: 22 G 1.5 in needle, anterolateral approach Medications (Right): 4 mL lidocaine  1 %; 2 mL triamcinolone  acetonide 40 MG/ML Medications (Left): 4 mL lidocaine  1 %; 2 mL triamcinolone  acetonide 40 MG/ML Outcome: tolerated well, no immediate complications Procedure, treatment alternatives, risks and benefits explained, specific risks discussed. Consent was given by the patient. Immediately prior to procedure a time out was called to verify the correct patient, procedure, equipment, support staff and site/side marked as required. Patient was prepped and draped in the usual sterile fashion.        I personally saw and evaluated the patient, and participated in the management and treatment plan.   Leonce Reveal, PA-C Orthopedics   This document was dictated using Conservation officer, historic buildings. A reasonable attempt at proof reading has been made to minimize errors.

## 2024-05-11 ENCOUNTER — Ambulatory Visit: Payer: Self-pay

## 2024-05-11 NOTE — Telephone Encounter (Signed)
  FYI Only or Action Required?: FYI only for provider. Patient prefers to schedule with kidney specialist and called PCP in error  Patient was last seen in primary care on 03/30/2024 by Colette Torrence GRADE, MD.  Called Nurse Triage reporting Back Pain.  Symptoms began a week ago.  Interventions attempted: Nothing.  Symptoms are: unchanged.  Triage Disposition: See PCP When Office is Open (Within 3 Days)  Patient/caregiver understands and will follow disposition?: Unsure Copied from CRM #[id number]. Topic: Clinical - Red Word Triage >> May 11, 2024  3:08 PM Wess RAMAN wrote: Red Word that prompted transfer to Nurse Triage: Patient has a cyst on each kidney and her back is bothering her. Pain level 6. Reason for Disposition  [1] MODERATE back pain (e.g., interferes with normal activities) AND [2] present > 3 days  Answer Assessment - Initial Assessment Questions Concerned that the cysts on her kidneys have increased in size  1. ONSET: When did the pain begin? (e.g., minutes, hours, days)     One and one half weeks ago 2. LOCATION: Where does it hurt? (upper, mid or lower back)     Low back pain 3. SEVERITY: How bad is the pain?  (e.g., Scale 1-10; mild, moderate, or severe)     6/10 4. PATTERN: Is the pain constant? (e.g., yes, no; constant, intermittent)      constant 5. RADIATION: Does the pain shoot into your legs or somewhere else?     Does not think she has any radiation, but she does have knee pain and recently received bilateral injections 6. CAUSE:  What do you think is causing the back pain?      Concern that the pain is from the cyst on her kidneys 7. BACK OVERUSE:  Any recent lifting of heavy objects, strenuous work or exercise?     denies 8. MEDICINES: What have you taken so far for the pain? (e.g., nothing, acetaminophen , NSAIDS)     No medication taken 9. NEUROLOGIC SYMPTOMS: Do you have any weakness, numbness, or problems with bowel/bladder control?      denies 10. OTHER SYMPTOMS: Do you have any other symptoms? (e.g., fever, abdomen pain, burning with urination, blood in urine)       denies 11. PREGNANCY: Is there any chance you are pregnant? When was your last menstrual period?       N/a  Protocols used: Back Pain-A-AH

## 2024-05-13 NOTE — Telephone Encounter (Signed)
      Copied from CRM #[id number]. Topic: General - Other >> May 11, 2024  3:10 PM Wess S wrote: Reason for CRM: Patient has been taking TRULICITY  1.5 MG/0.5ML SOAJ TRULICITY  1.5 MG/0.5ML SOAJ for years and would like to switch to Ozempic .   Callback #: [id number]  Pharmacy: [facility] [phone] - [place], [place] - [address] AT [facility] OF [address] [place] [address] Phone: [phone] Fax: [phone] Hours: Not open 24 hours

## 2024-05-14 NOTE — Telephone Encounter (Signed)
 Called patient, no answer, lvm for the patient to call back to schedule an appointment to discuss Ozempic  with Sara Gardner.

## 2024-05-28 ENCOUNTER — Encounter: Payer: Self-pay | Admitting: Radiology

## 2024-06-11 ENCOUNTER — Other Ambulatory Visit: Payer: Self-pay

## 2024-06-13 MED ORDER — ISOSORBIDE MONONITRATE ER 60 MG PO TB24: 60.0000 mg | ORAL_TABLET | Freq: Every day | ORAL | 1 refills | Status: AC

## 2024-06-25 ENCOUNTER — Encounter: Payer: Self-pay | Admitting: Family Medicine

## 2024-06-25 ENCOUNTER — Other Ambulatory Visit: Payer: Self-pay | Admitting: Family Medicine

## 2024-06-25 DIAGNOSIS — Z111 Encounter for screening for respiratory tuberculosis: Secondary | ICD-10-CM | POA: Insufficient documentation

## 2024-06-25 NOTE — Telephone Encounter (Unsigned)
 Copied from CRM #[id number]. Topic: Clinical - Request for Lab/Test Order >> Jun 25, 2024 11:44 AM Laymon HERO wrote: Reason for CRM: Pt is unable to take a TB test due to being allergic to the serum- needing to get an xray done instead- she is starting school.

## 2024-06-25 NOTE — Progress Notes (Signed)
 Patient reports that she always gets chest x-ray done for TB screening. Offered Quantiferon TB. Chest x-ray ordered.

## 2024-06-25 NOTE — Telephone Encounter (Signed)
 Contacted patient and advised a Quantiferon TB Gold is advised before proceeding with a Chest X-Ray. Patient states she has always just done the Chest X-Ray and she doesn't understand why she has to do blood work. I advised a Chest XR is usually the second step after the serum or blood draw come back positive and if they are negative we do not move forward with a Chest XR. Patient stated she would just like to move forward with Chest X-Ray like in the past.

## 2024-06-26 ENCOUNTER — Ambulatory Visit

## 2024-06-27 ENCOUNTER — Other Ambulatory Visit: Payer: Self-pay | Admitting: Family Medicine

## 2024-07-01 ENCOUNTER — Ambulatory Visit: Payer: Self-pay | Admitting: Family Medicine

## 2024-07-25 ENCOUNTER — Other Ambulatory Visit: Payer: Self-pay | Admitting: Family Medicine

## 2024-07-30 ENCOUNTER — Ambulatory Visit: Admitting: Family Medicine

## 2024-07-30 ENCOUNTER — Telehealth: Payer: Self-pay

## 2024-07-30 NOTE — Telephone Encounter (Signed)
 Yes that is fine

## 2024-07-30 NOTE — Telephone Encounter (Signed)
 Patient Came into office and would like to know if she can do Transfer of Care from Dr. Colette to [facility], please advise, thanks.

## 2024-08-10 ENCOUNTER — Ambulatory Visit: Admitting: Urgent Care

## 2024-08-10 ENCOUNTER — Encounter: Payer: Self-pay | Admitting: Urgent Care

## 2024-08-10 VITALS — BP 105/69 | HR 71 | Ht 63.0 in | Wt 159.0 lb

## 2024-08-10 DIAGNOSIS — Z7984 Long term (current) use of oral hypoglycemic drugs: Secondary | ICD-10-CM | POA: Diagnosis not present

## 2024-08-10 DIAGNOSIS — E785 Hyperlipidemia, unspecified: Secondary | ICD-10-CM | POA: Diagnosis not present

## 2024-08-10 DIAGNOSIS — E890 Postprocedural hypothyroidism: Secondary | ICD-10-CM

## 2024-08-10 DIAGNOSIS — G8929 Other chronic pain: Secondary | ICD-10-CM | POA: Diagnosis not present

## 2024-08-10 DIAGNOSIS — E119 Type 2 diabetes mellitus without complications: Secondary | ICD-10-CM

## 2024-08-10 DIAGNOSIS — R634 Abnormal weight loss: Secondary | ICD-10-CM

## 2024-08-10 DIAGNOSIS — E1169 Type 2 diabetes mellitus with other specified complication: Secondary | ICD-10-CM

## 2024-08-10 DIAGNOSIS — N1832 Chronic kidney disease, stage 3b: Secondary | ICD-10-CM | POA: Diagnosis not present

## 2024-08-10 DIAGNOSIS — M25562 Pain in left knee: Secondary | ICD-10-CM

## 2024-08-10 DIAGNOSIS — R232 Flushing: Secondary | ICD-10-CM

## 2024-08-10 DIAGNOSIS — I110 Hypertensive heart disease with heart failure: Secondary | ICD-10-CM | POA: Insufficient documentation

## 2024-08-10 DIAGNOSIS — K219 Gastro-esophageal reflux disease without esophagitis: Secondary | ICD-10-CM

## 2024-08-10 DIAGNOSIS — M25561 Pain in right knee: Secondary | ICD-10-CM | POA: Diagnosis not present

## 2024-08-10 DIAGNOSIS — Z79899 Other long term (current) drug therapy: Secondary | ICD-10-CM

## 2024-08-10 DIAGNOSIS — Z85850 Personal history of malignant neoplasm of thyroid: Secondary | ICD-10-CM | POA: Insufficient documentation

## 2024-08-10 LAB — POCT UA - MICROALBUMIN
Creatinine, POC: 100 mg/dL
Microalbumin Ur, POC: 150 mg/L

## 2024-08-10 LAB — POCT GLYCOSYLATED HEMOGLOBIN (HGB A1C): Hemoglobin A1C: 6.2 % — AB (ref 4.0–5.6)

## 2024-08-10 MED ORDER — TIRZEPATIDE 7.5 MG/0.5ML ~~LOC~~ SOAJ: 7.5000 mg | SUBCUTANEOUS | 2 refills | Status: AC

## 2024-08-10 NOTE — Patient Instructions (Addendum)
 Transition over to mounjaro . This will require prior authorization, so please remain on the trulicity  until this is approved.  Continue all of your medications as ordered.  I will send you a message this weekend regarding your labs to determine if we find a source of the hot flashes.   Please return to see me in 4 months, sooner if needed.

## 2024-08-10 NOTE — Progress Notes (Unsigned)
 "  Established Patient Office Visit  Subjective:  Patient ID: Sara Gardner, female    DOB: [birth year]  Age: 74 y.o. MRN: [id number]  Chief Complaint  Patient presents with   Medical Management of Chronic Issues    HPI  Patient Active Problem List   Diagnosis Date Noted   History of thyroid  cancer 08/10/2024   Stage 3b chronic kidney disease (HCC) 08/10/2024   Hypertensive heart disease with heart failure (HCC) 08/10/2024   Screening-pulmonary TB 06/25/2024   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/05/2019   Cervicalgia 04/05/2019   Eustachian tube dysfunction, bilateral 04/05/2019   White matter abnormality on MRI of brain 04/05/2019   Myalgia due to statin 12/14/2018   Normocytic anemia 12/14/2018   At moderate risk for fall 10/03/2018   Bilateral renal cysts 10/03/2018   Facet arthropathy, lumbosacral 10/03/2018   Lacunar infarction (HCC) 07/20/2018   Controlled type 2 diabetes mellitus without complication, without long-term current use of insulin  (HCC) 03/08/2018   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/23/2017   Overactive bladder 08/17/2017   Equinus contracture of ankle 07/27/2017   Hammer toes of both feet 07/27/2017   Dermatophytosis, nail 07/27/2017   Calcaneal spur of both feet 07/27/2017   Aortic atherosclerosis 06/10/2017   Combined form of age-related cataract, both eyes 04/07/2017   Dermatochalasis of both eyelids 04/07/2017   Posterior vitreous detachment, right eye 04/07/2017   Postural kyphosis of cervicothoracic region 03/09/2017   Lumbar degenerative disc disease 03/09/2017   Bilateral primary osteoarthritis of knee 12/15/2016   Allergy to honey bee venom 12/15/2016   Hypertension 02/16/2016   Dyslipidemia associated with type 2 diabetes mellitus (HCC) 02/10/2016   Gastroesophageal reflux disease without esophagitis 02/10/2016   Left ventricular diastolic dysfunction, NYHA class 1 01/28/2016    Hypothyroidism, 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   Urticaria 12/30/2015   Diabetic sensorimotor polyneuropathy (HCC) 10/27/2015   Low back strain 10/27/2015   Right inguinal pain 04/21/2015   Healthcare maintenance 12/30/2014   Skin rash 09/16/2014   Spider veins of both lower extremities 09/16/2014   Post-menopausal atrophic vaginitis 06/24/2014   Nodule of left lung 01/21/2014   Class 1 obesity due to excess calories with serious comorbidity and body mass index (BMI) of 30.0 to 30.9 in adult 06/12/2012   Palpitations 11/29/2011   Cutaneous horn 09/29/2011   Abnormal mammogram of right breast 08/30/2011   Arthritis of left knee 05/24/2011   CAD (coronary artery disease) 05/24/2011   History of acute myocardial infarction of inferior wall 05/24/2011   Gastro-esophageal reflux disease with esophagitis 05/24/2011   Hyperlipidemia 05/24/2011   Past Medical History:  Diagnosis Date   Aortic atherosclerosis 06/10/2017   CAD (coronary artery disease)    Cancer (HCC)    Combined form of age-related cataract, both eyes 04/07/2017   Dermatochalasis of both eyelids 04/07/2017   Diabetes mellitus without complication (HCC)    GERD (gastroesophageal reflux disease)    Hypertension    Hypothyroidism    Lacunar infarction (HCC) 07/20/2018   Left renal mass 10/04/2018   1.7 cm hypoechoic mass 10/04/2018   Lumbar degenerative disc disease 03/09/2017   Myocardial infarction (HCC) 2005   Normocytic anemia 12/14/2018   Palpitations    Papillary thyroid  carcinoma (HCC)    Posterior vitreous detachment, right eye 04/07/2017   Past Surgical History:  Procedure Laterality Date   TOTAL THYROIDECTOMY  2016   Social History[1]    ROS: as noted in HPI  Objective:     BP 105/69  Pulse 71   Ht 5' 3 (1.6 m)   Wt 159 lb (72.1 kg)   SpO2 99%   BMI 28.17 kg/m  BP Readings from Last 3 Encounters:  08/10/24 105/69  03/30/24 95/61  01/04/24 100/68   Wt  Readings from Last 3 Encounters:  08/10/24 159 lb (72.1 kg)  03/30/24 164 lb 12.8 oz (74.8 kg)  01/04/24 164 lb 3.2 oz (74.5 kg)      Physical Exam   No results found for any visits on 08/10/24.  Last CBC Lab Results  Component Value Date   WBC 6.7 08/25/2023   HGB 11.3 (L) 08/25/2023   HCT 36.0 08/25/2023   MCV 84.5 08/25/2023   MCH 26.5 08/25/2023   RDW 16.2 (H) 08/25/2023   PLT 238 08/25/2023   Last metabolic panel Lab Results  Component Value Date   GLUCOSE 97 08/25/2023   NA 141 08/25/2023   K 3.6 08/25/2023   CL 104 08/25/2023   CO2 34 (H) 08/25/2023   BUN 16 08/25/2023   CREATININE 1.40 (H) 08/25/2023   GFRNONAA 40 (L) 08/25/2023   CALCIUM  9.2 08/25/2023   PROT 6.7 09/13/2023   ALBUMIN 4.1 09/13/2023   LABGLOB 2.4 05/06/2023   AGRATIO 1.4 08/12/2022   BILITOT 0.3 09/13/2023   ALKPHOS 72 09/13/2023   AST 24 09/13/2023   ALT 23 09/13/2023   ANIONGAP 3 (L) 08/25/2023   Last lipids Lab Results  Component Value Date   CHOL 144 09/13/2023   HDL 66 09/13/2023   LDLCALC 66 09/13/2023   TRIG 60 09/13/2023   CHOLHDL 2.2 09/13/2023   Last hemoglobin A1c Lab Results  Component Value Date   HGBA1C 6.4 (H) 11/04/2023   Last thyroid  functions Lab Results  Component Value Date   TSH 1.76 02/05/2022   Last vitamin D  Lab Results  Component Value Date   VD25OH 85.8 05/06/2023   Last vitamin B12 and Folate Lab Results  Component Value Date   VITAMINB12 882 05/06/2023      The ASCVD Risk score (Arnett DK, et al., 2019) failed to calculate for the following reasons:   Risk score cannot be calculated because patient has a medical history suggesting prior/existing ASCVD   * - Cholesterol units were assumed  Assessment & Plan:  Controlled type 2 diabetes mellitus without complication, without long-term current use of insulin  (HCC)  Gastroesophageal reflux disease without esophagitis  Dyslipidemia associated with type 2 diabetes mellitus  (HCC)  History of thyroid  cancer  Hypothyroidism, postsurgical  Stage 3b chronic kidney disease (HCC)  Chronic pain of both knees  Long-term use of high-risk medication  Hypertensive heart disease with heart failure (HCC)     No follow-ups on file.   Benton LITTIE Gave, PA      [1] Social History Tobacco Use   Smoking status: Former    Passive exposure: Never   Smokeless tobacco: Never  Vaping Use   Vaping status: Never Used  Substance Use Topics   Alcohol use: No   Drug use: No  "

## 2024-08-11 LAB — CBC WITH DIFFERENTIAL/PLATELET
Basophils Absolute: 0 x10E3/uL (ref 0.0–0.2)
Basos: 1 %
EOS (ABSOLUTE): 0.2 x10E3/uL (ref 0.0–0.4)
Eos: 4 %
Hematocrit: 34.3 % (ref 34.0–46.6)
Hemoglobin: 11.3 g/dL (ref 11.1–15.9)
Immature Grans (Abs): 0 x10E3/uL (ref 0.0–0.1)
Immature Granulocytes: 0 %
Lymphocytes Absolute: 2.3 x10E3/uL (ref 0.7–3.1)
Lymphs: 41 %
MCH: 27.5 pg (ref 26.6–33.0)
MCHC: 32.9 g/dL (ref 31.5–35.7)
MCV: 84 fL (ref 79–97)
Monocytes Absolute: 0.6 x10E3/uL (ref 0.1–0.9)
Monocytes: 10 %
Neutrophils Absolute: 2.5 x10E3/uL (ref 1.4–7.0)
Neutrophils: 44 %
Platelets: 248 x10E3/uL (ref 150–450)
RBC: 4.11 x10E6/uL (ref 3.77–5.28)
RDW: 15.4 % (ref 11.7–15.4)
WBC: 5.7 x10E3/uL (ref 3.4–10.8)

## 2024-08-11 LAB — LIPID PANEL
Chol/HDL Ratio: 2.2 ratio (ref 0.0–4.4)
Cholesterol, Total: 145 mg/dL (ref 100–199)
HDL: 66 mg/dL
LDL Chol Calc (NIH): 66 mg/dL (ref 0–99)
Triglycerides: 62 mg/dL (ref 0–149)
VLDL Cholesterol Cal: 13 mg/dL (ref 5–40)

## 2024-08-11 LAB — COMPREHENSIVE METABOLIC PANEL WITH GFR
ALT: 26 IU/L (ref 0–32)
AST: 23 IU/L (ref 0–40)
Albumin: 4.2 g/dL (ref 3.8–4.8)
Alkaline Phosphatase: 67 IU/L (ref 49–135)
BUN/Creatinine Ratio: 18 (ref 12–28)
BUN: 21 mg/dL (ref 8–27)
Bilirubin Total: 0.3 mg/dL (ref 0.0–1.2)
CO2: 26 mmol/L (ref 20–29)
Calcium: 9.8 mg/dL (ref 8.7–10.3)
Chloride: 100 mmol/L (ref 96–106)
Creatinine, Ser: 1.2 mg/dL — ABNORMAL HIGH (ref 0.57–1.00)
Globulin, Total: 2.2 g/dL (ref 1.5–4.5)
Glucose: 86 mg/dL (ref 70–99)
Potassium: 4.1 mmol/L (ref 3.5–5.2)
Sodium: 142 mmol/L (ref 134–144)
Total Protein: 6.4 g/dL (ref 6.0–8.5)
eGFR: 47 mL/min/1.73 — ABNORMAL LOW

## 2024-08-11 LAB — B12 AND FOLATE PANEL
Folate: 4.8 ng/mL
Vitamin B-12: 1011 pg/mL (ref 232–1245)

## 2024-08-11 LAB — TSH: TSH: 0.895 u[IU]/mL (ref 0.450–4.500)

## 2024-08-11 LAB — MAGNESIUM: Magnesium: 2.1 mg/dL (ref 1.6–2.3)

## 2024-08-12 ENCOUNTER — Ambulatory Visit: Payer: Self-pay | Admitting: Urgent Care

## 2024-08-12 DIAGNOSIS — R232 Flushing: Secondary | ICD-10-CM

## 2024-08-12 MED ORDER — VEOZAH 45 MG PO TABS: 1.0000 | ORAL_TABLET | Freq: Every day | ORAL | 11 refills | Status: AC

## 2024-08-13 ENCOUNTER — Other Ambulatory Visit (HOSPITAL_COMMUNITY): Payer: Self-pay

## 2024-08-14 ENCOUNTER — Other Ambulatory Visit: Payer: Self-pay | Admitting: Urgent Care

## 2024-08-14 DIAGNOSIS — Z1231 Encounter for screening mammogram for malignant neoplasm of breast: Secondary | ICD-10-CM

## 2024-08-15 ENCOUNTER — Emergency Department (HOSPITAL_BASED_OUTPATIENT_CLINIC_OR_DEPARTMENT_OTHER)
Admission: EM | Admit: 2024-08-15 | Discharge: 2024-08-15 | Disposition: A | Discharge status: Home / Self Care | Attending: Emergency Medicine | Admitting: Emergency Medicine

## 2024-08-15 ENCOUNTER — Telehealth: Payer: Self-pay | Admitting: Pharmacy Technician

## 2024-08-15 ENCOUNTER — Encounter: Payer: Self-pay | Admitting: Emergency Medicine

## 2024-08-15 ENCOUNTER — Ambulatory Visit
Admission: EM | Admit: 2024-08-15 | Discharge: 2024-08-15 | Disposition: A | Discharge status: Home / Self Care | Attending: Family Medicine | Admitting: Family Medicine

## 2024-08-15 ENCOUNTER — Other Ambulatory Visit: Payer: Self-pay

## 2024-08-15 ENCOUNTER — Other Ambulatory Visit (HOSPITAL_COMMUNITY): Payer: Self-pay

## 2024-08-15 ENCOUNTER — Emergency Department (HOSPITAL_BASED_OUTPATIENT_CLINIC_OR_DEPARTMENT_OTHER)

## 2024-08-15 ENCOUNTER — Encounter (HOSPITAL_BASED_OUTPATIENT_CLINIC_OR_DEPARTMENT_OTHER): Payer: Self-pay | Admitting: Emergency Medicine

## 2024-08-15 DIAGNOSIS — R9431 Abnormal electrocardiogram [ECG] [EKG]: Secondary | ICD-10-CM

## 2024-08-15 DIAGNOSIS — E119 Type 2 diabetes mellitus without complications: Secondary | ICD-10-CM | POA: Diagnosis not present

## 2024-08-15 DIAGNOSIS — R072 Precordial pain: Secondary | ICD-10-CM | POA: Insufficient documentation

## 2024-08-15 DIAGNOSIS — R079 Chest pain, unspecified: Secondary | ICD-10-CM

## 2024-08-15 DIAGNOSIS — Z7982 Long term (current) use of aspirin: Secondary | ICD-10-CM | POA: Insufficient documentation

## 2024-08-15 DIAGNOSIS — I10 Essential (primary) hypertension: Secondary | ICD-10-CM | POA: Insufficient documentation

## 2024-08-15 DIAGNOSIS — Z7984 Long term (current) use of oral hypoglycemic drugs: Secondary | ICD-10-CM | POA: Diagnosis not present

## 2024-08-15 DIAGNOSIS — Z79899 Other long term (current) drug therapy: Secondary | ICD-10-CM | POA: Insufficient documentation

## 2024-08-15 LAB — BASIC METABOLIC PANEL WITH GFR
Anion gap: 10 (ref 5–15)
BUN: 18 mg/dL (ref 8–23)
CO2: 31 mmol/L (ref 22–32)
Calcium: 10 mg/dL (ref 8.9–10.3)
Chloride: 104 mmol/L (ref 98–111)
Creatinine, Ser: 1.08 mg/dL — ABNORMAL HIGH (ref 0.44–1.00)
GFR, Estimated: 54 mL/min — ABNORMAL LOW
Glucose, Bld: 87 mg/dL (ref 70–99)
Potassium: 3.9 mmol/L (ref 3.5–5.1)
Sodium: 144 mmol/L (ref 135–145)

## 2024-08-15 LAB — TROPONIN T, HIGH SENSITIVITY
Troponin T High Sensitivity: 8 ng/L (ref 0–19)
Troponin T High Sensitivity: 7 ng/L (ref 0–19)

## 2024-08-15 LAB — CBC
HCT: 39.2 % (ref 36.0–46.0)
Hemoglobin: 12.3 g/dL (ref 12.0–15.0)
MCH: 26.4 pg (ref 26.0–34.0)
MCHC: 31.4 g/dL (ref 30.0–36.0)
MCV: 84.1 fL (ref 80.0–100.0)
Platelets: 261 K/uL (ref 150–400)
RBC: 4.66 MIL/uL (ref 3.87–5.11)
RDW: 16.1 % — ABNORMAL HIGH (ref 11.5–15.5)
WBC: 6.7 K/uL (ref 4.0–10.5)
nRBC: 0 % (ref 0.0–0.2)

## 2024-08-15 MED ORDER — LIDOCAINE 5 % EX PTCH
1.0000 | MEDICATED_PATCH | CUTANEOUS | Status: DC
  Administered 2024-08-15: 1 via TRANSDERMAL
  Filled 2024-08-15: qty 1

## 2024-08-15 MED ORDER — LIDOCAINE 5 % EX PTCH: 1.0000 | MEDICATED_PATCH | CUTANEOUS | 0 refills | Status: AC

## 2024-08-15 NOTE — ED Notes (Signed)
 Patient transferred from waiting room to ED treatment room. Assuming pt care at this time.

## 2024-08-15 NOTE — ED Provider Notes (Signed)
 " [facility] Provider Note   CSN: [id number] Arrival date & time: 08/15/24  1745     Patient presents with: Chest Pain   Sara Gardner is a 74 y.o. female.   Patient who follows with cardiology (Dr. Pietro), per cardiology note h/o myocardial infarction in 2005, nuclear study February 2023 showed ejection fraction 61% and no ischemia, monitor March 2023 showed sinus rhythm with occasional PAC, brief episodes of Sara and rare PVC, dobutamine echocardiogram June 2023 at [facility] showed normal LV function and no ischemia, lower extremity venous Doppler September 2024 showed no DVT -- h/o DM, occasional CP on Imdur  and NTG, HTN, GERD -- presents for evaluation of CP.  Patient reports sitting, watching TV when her symptoms started shortly after 2 PM today.  Patient reports a sharp pain in her left upper chest.  She has had pain in this area before, but cannot say exactly when.  She states that the skin feels very sensitive and touching the area makes the pain worse.  She has had some hot flashes lately, sometimes at night, sometimes during the day.  She has told her primary care provider about this.  She states that they checked labs but she does not know the results.  No associated shortness of breath.  No radiation of pain.  No associated nausea or vomiting.  No cough or fever.  Patient does have nitroglycerin  at home but has not taken today.  She states that urgent care was concerned about her EKG.  Pain is currently improving but she still rates a 6 out of 10.       Prior to Admission medications  Medication Sig Start Date End Date Taking? Authorizing Provider  Accu-Chek Softclix Lancets lancets Use as instructed. Accu-chek Guide 10/23/21   Willo Mini, NP  AMBULATORY NON FORMULARY MEDICATION Knee-high, medium compression, graduated compression stockings. Apply to lower extremities. [email or web address], Zippered Compression Stockings, medium  circ, long length 12/15/16   Tommas Severa Norris, PA-C  ammonium lactate  (LAC-HYDRIN ) 12 % lotion APPLY TOPICALLY TO THE AFFECTED AREA AS NEEDED FOR DRY SKIN 01/11/24   Colette Torrence GRADE, MD  ASPIRIN PO Take 81 mg by mouth daily.    [provider]  blood glucose meter kit and supplies Dispense based on patient and insurance preference. Check daily. (FOR ICD-10 E10.9, E11.9). 10/23/21   Willo Mini, NP  dapagliflozin  propanediol (FARXIGA ) 5 MG TABS tablet Take 1 tablet (5 mg total) by mouth daily. 04/02/24   Colette Torrence GRADE, MD  diclofenac  Sodium (VOLTAREN  ARTHRITIS PAIN) 1 % GEL Apply 2 g topically 4 (four) times daily as needed. 03/30/24   Colette Torrence GRADE, MD  esomeprazole  (NEXIUM ) 20 MG capsule TAKE 1 CAPSULE(20 MG TOTAL) BY MOUTH DAILY AT 12:00PM 04/26/24   Booker Darice SAUNDERS, FNP  Fezolinetant  (VEOZAH ) 45 MG TABS Take 1 tablet (45 mg total) by mouth daily. 08/12/24   Crain, Whitney L, PA  fluticasone  (FLONASE ) 50 MCG/ACT nasal spray Place into both nostrils as needed for allergies or rhinitis.    [provider]  glucose blood (ACCU-CHEK GUIDE) test strip USE AS INSTRUCTED TO TEST ONCE DAILY 11/25/22   Willo Mini, NP  isosorbide  mononitrate (IMDUR ) 60 MG 24 hr tablet Take 1 tablet (60 mg total) by mouth daily. 06/13/24   Pietro Redell RAMAN, MD  levothyroxine  (SYNTHROID ) 88 MCG tablet Take 88 mcg by mouth daily before breakfast. 08/04/23   [provider]  lisinopril -hydrochlorothiazide  (ZESTORETIC ) 20-12.5 MG  tablet TAKE 1 TABLET BY MOUTH DAILY 06/29/23   Pietro Redell RAMAN, MD  metFORMIN  (GLUCOPHAGE ) 1000 MG tablet TAKE 1 TABLET(1000 MG) BY MOUTH EVERY MORNING 06/27/24   Booker Darice SAUNDERS, FNP  metoprolol  succinate (TOPROL -XL) 50 MG 24 hr tablet TAKE 1 TABLET(50 MG) BY MOUTH DAILY WITH OR IMMEDIATELY FOLLOWING A MEAL 04/27/24   Pietro Redell RAMAN, MD  naproxen  (NAPROSYN ) 500 MG tablet Take 1 tablet (500 mg total) by mouth 2 (two) times daily with a meal. 08/25/23   Cleotilde Redell, MD   nitroGLYCERIN  (NITROSTAT ) 0.4 MG SL tablet DISSOLVE 1 TABLET UNDER THE TONGUE EVERY 5 MINUTES, AS NEEDED FOR CHEST PAIN. MAX 3 TABLETS IN 15 MINUTES 09/07/21   Pietro Redell RAMAN, MD  rosuvastatin  (CRESTOR ) 40 MG tablet TAKE 1 TABLET(40 MG) BY MOUTH AT BEDTIME 04/17/24   Booker Darice SAUNDERS, FNP  tirzepatide  (MOUNJARO ) 7.5 MG/0.5ML Pen Inject 7.5 mg into the skin once a week. 08/10/24   Crain, Whitney L, PA  triamcinolone  cream (KENALOG ) 0.1 % Apply topically 2 (two) times daily as needed. 09/05/23   Colette Torrence GRADE, MD  valACYclovir  (VALTREX ) 1000 MG tablet Take 1 tablet (1,000 mg total) by mouth 3 (three) times daily. 06/01/23   Teddy Sharper, FNP  VITAMIN D  PO Take by mouth.    [provider]    Allergies: Bee venom, Tape, and Tuberculin tests    Review of Systems  Updated Vital Signs BP 119/65 (BP Location: Right Arm)   Pulse 75   Temp 98 F (36.7 C) (Oral)   Resp 18   Ht 5' 3 (1.6 m)   Wt 72.1 kg   SpO2 98%   BMI 28.17 kg/m   Physical Exam Vitals and nursing note reviewed.  Constitutional:      Appearance: She is well-developed. She is not diaphoretic.  HENT:     Head: Normocephalic and atraumatic.     Mouth/Throat:     Mouth: Mucous membranes are not dry.  Eyes:     Conjunctiva/sclera: Conjunctivae normal.  Neck:     Vascular: Normal carotid pulses. No JVD.     Trachea: Trachea normal. No tracheal deviation.  Cardiovascular:     Rate and Rhythm: Normal rate and regular rhythm.     Pulses: No decreased pulses.          Radial pulses are 2+ on the right side and 2+ on the left side.     Heart sounds: Normal heart sounds, S1 normal and S2 normal. No murmur heard. Pulmonary:     Effort: Pulmonary effort is normal. No respiratory distress.     Breath sounds: No wheezing.  Chest:     Chest wall: Tenderness present.     Comments: Patient with tenderness over the superior portion of the left anterior chest wall.  I do not see any rashes or lesions. Abdominal:      General: Bowel sounds are normal.     Palpations: Abdomen is soft.     Tenderness: There is no abdominal tenderness. There is no guarding or rebound.  Musculoskeletal:        General: Normal range of motion.     Cervical back: Normal range of motion and neck supple. No muscular tenderness.  Skin:    General: Skin is warm and dry.     Coloration: Skin is not pale.  Neurological:     Mental Status: She is alert.     (all labs ordered are listed, but only abnormal results are  displayed) Labs Reviewed  BASIC METABOLIC PANEL WITH GFR - Abnormal; Notable for the following components:      Result Value   Creatinine, Ser 1.08 (*)    GFR, Estimated 54 (*)    All other components within normal limits  CBC - Abnormal; Notable for the following components:   RDW 16.1 (*)    All other components within normal limits  TROPONIN T, HIGH SENSITIVITY  TROPONIN T, HIGH SENSITIVITY    EKG: EKG Interpretation Date/Time:  Wednesday August 15 2024 17:55:38 EST Ventricular Rate:  88 PR Interval:  163 QRS Duration:  93 QT Interval:  362 QTC Calculation: 438 R Axis:   -2  Text Interpretation: Sinus rhythm Low voltage, precordial leads since last tracing no significant change Confirmed by Lenor Hollering [phone]) on 08/15/2024 6:00:29 PM  Radiology: [facility] Chest 2 View Result Date: 08/15/2024 EXAM: 2 VIEW(S) XRAY OF THE CHEST 08/15/2024 06:14:00 PM COMPARISON: Chest x ray 07/16/2024. CLINICAL HISTORY: chest pain FINDINGS: LUNGS AND PLEURA: No focal pulmonary opacity. No pleural effusion. No pneumothorax. HEART AND MEDIASTINUM: Calcified aorta. No acute abnormality of the cardiac and mediastinal silhouettes. BONES AND SOFT TISSUES: Thoracic degenerative changes. No acute osseous abnormality. IMPRESSION: 1. No acute findings. Electronically signed by: Greig Pique MD 08/15/2024 06:17 PM EST RP Workstation: [id number]     Procedures   Medications Ordered in the ED  lidocaine  (LIDODERM ) 5 % 1 patch (1  patch Transdermal Patch Applied 08/15/24 1847)    ED Course  Patient seen and examined. History obtained directly from patient.   Labs/EKG: Ordered CBC, BMP, troponin, EKG  Imaging: Ordered chest x-ray.  Medications/Fluids: Offered nitroglycerin , patient declines.  Given her sensitivity on the chest wall, will trial Lidoderm  patch.  Most recent vital signs reviewed and are as follows: BP 119/65 (BP Location: Right Arm)   Pulse 75   Temp 98 F (36.7 C) (Oral)   Resp 18   Ht 5' 3 (1.6 m)   Wt 72.1 kg   SpO2 98%   BMI 28.17 kg/m   Initial impression: Atypical chest pain in patient with risk factors.  Her EKG here with some nonspecific T wave changes similar to previous, no ST segment elevation or depression.  Normal sinus rhythm.  9:15 PM Reassessment performed. Patient appears stable, comfortable.  She states that the lidocaine  patch was helpful and she is requesting prescription for home.  Labs personally reviewed and interpreted including: CBC unremarkable; BMP unremarkable; troponin 8 >> 7.   Imaging personally visualized and interpreted including: Chest x-ray agree negative.  Reviewed pertinent lab work and imaging with patient at bedside. Questions answered.   Most current vital signs reviewed and are as follows: BP 117/84 (BP Location: Left Arm)   Pulse 68   Temp 98 F (36.7 C) (Oral)   Resp 12   Ht 5' 3 (1.6 m)   Wt 72.1 kg   SpO2 97%   BMI 28.17 kg/m   Plan: Discharge to home.   Prescriptions written for: Lidocaine  patch  Return and follow-up instructions: I encouraged patient to return to ED with severe chest pain, especially if the pain is crushing or pressure-like and spreads to the arms, back, neck, or jaw, or if they have associated sweating, vomiting, or shortness of breath with the pain, or significant pain with activity. We discussed that the evaluation here today indicates a low-risk of serious cause of chest pain, including heart trouble or a blood  clot, but no evaluation is perfect and chest  pain can evolve with time. The patient verbalized understanding and agreed.  I encouraged patient to follow-up with their provider in the next 72 hours for recheck.                                     Medical Decision Making Amount and/or Complexity of Data Reviewed Labs: ordered. Radiology: ordered.  Risk Prescription drug management.   For this patient's complaint of chest pain, the following emergent conditions were considered on the differential diagnosis: acute coronary syndrome, pulmonary embolism, pneumothorax, myocarditis, pericardial tamponade, aortic dissection, thoracic aortic aneurysm complication, esophageal perforation.   Other causes were also considered including: gastroesophageal reflux disease, musculoskeletal pain including costochondritis, pneumonia/pleurisy, herpes zoster, pericarditis.  In regards to possibility of ACS, patient has atypical features of pain, non-ischemic and unchanged EKG and negative troponin(s).   In regards to possibility of PE, symptoms are atypical for PE and risk profile is low, making PE low likelihood.  No significant shortness of breath.  She is not tachycardic or hypoxic.  Low clinical concern for PE.  Patient's pain seems superficial.  I do not see any sign of shingles at this time.  She has had improvement with Lidoderm  patch.  Cardiac workup is reassuring and chest x-ray without signs of pneumonia or other intra thoracic etiology.  Will continue symptomatic treatment and I encouraged close outpatient follow-up.  The patient's vital signs, pertinent lab work and imaging were reviewed and interpreted as discussed in the ED course. Hospitalization was considered for further testing, treatments, or serial exams/observation. However as patient is well-appearing, has a stable exam, and reassuring studies today, I do not feel that they warrant admission at this time. This plan was discussed with the  patient who verbalizes agreement and comfort with this plan and seems reliable and able to return to the Emergency Department with worsening or changing symptoms.        Final diagnoses:  Precordial pain    ED Discharge Orders          Ordered    lidocaine  (LIDODERM ) 5 %  Every 24 hours        08/15/24 2114               Desiderio Chew, PA-C 08/15/24 2117    Lenor Hollering, MD 08/15/24 2330  "

## 2024-08-15 NOTE — ED Provider Notes (Signed)
 " Sara Gardner CARE    CSN: [id number] Arrival date & time: 08/15/24  1613      History   Chief Complaint Chief Complaint  Patient presents with   Chest Pain    HPI Sara Gardner is a 74 y.o. female.   HPI 74 year old female presents with chest pain for 3 hours.  PMH significant for CAD, HTN, and diabetic sensorimotor polyneuropathy  Past Medical History:  Diagnosis Date   Aortic atherosclerosis 06/10/2017   CAD (coronary artery disease)    Cancer (HCC)    Combined form of age-related cataract, both eyes 04/07/2017   Dermatochalasis of both eyelids 04/07/2017   Diabetes mellitus without complication (HCC)    GERD (gastroesophageal reflux disease)    Hypertension    Hypothyroidism    Lacunar infarction (HCC) 07/20/2018   Left renal mass 10/04/2018   1.7 cm hypoechoic mass 10/04/2018   Lumbar degenerative disc disease 03/09/2017   Myocardial infarction (HCC) 2005   Normocytic anemia 12/14/2018   Palpitations    Papillary thyroid  carcinoma (HCC)    Posterior vitreous detachment, right eye 04/07/2017    Patient Active Problem List   Diagnosis Date Noted   History of thyroid  cancer 08/10/2024   Stage 3b chronic kidney disease (HCC) 08/10/2024   Hypertensive heart disease with heart failure (HCC) 08/10/2024   Screening-pulmonary TB 06/25/2024   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/05/2019   Cervicalgia 04/05/2019   Eustachian tube dysfunction, bilateral 04/05/2019   White matter abnormality on MRI of brain 04/05/2019   Myalgia due to statin 12/14/2018   Normocytic anemia 12/14/2018   At moderate risk for fall 10/03/2018   Bilateral renal cysts 10/03/2018   Facet arthropathy, lumbosacral 10/03/2018   Lacunar infarction (HCC) 07/20/2018   Controlled type 2 diabetes mellitus without complication, without long-term current use of insulin  (HCC) 03/08/2018   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/23/2017   Overactive bladder 08/17/2017    Equinus contracture of ankle 07/27/2017   Hammer toes of both feet 07/27/2017   Dermatophytosis, nail 07/27/2017   Calcaneal spur of both feet 07/27/2017   Aortic atherosclerosis 06/10/2017   Combined form of age-related cataract, both eyes 04/07/2017   Dermatochalasis of both eyelids 04/07/2017   Posterior vitreous detachment, right eye 04/07/2017   Postural kyphosis of cervicothoracic region 03/09/2017   Lumbar degenerative disc disease 03/09/2017   Bilateral primary osteoarthritis of knee 12/15/2016   Allergy to honey bee venom 12/15/2016   Hypertension 02/16/2016   Dyslipidemia associated with type 2 diabetes mellitus (HCC) 02/10/2016   Gastroesophageal reflux disease without esophagitis 02/10/2016   Left ventricular diastolic dysfunction, NYHA class 1 01/28/2016   Hypothyroidism, 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   Urticaria 12/30/2015   Diabetic sensorimotor polyneuropathy (HCC) 10/27/2015   Low back strain 10/27/2015   Right inguinal pain 04/21/2015   Healthcare maintenance 12/30/2014   Skin rash 09/16/2014   Spider veins of both lower extremities 09/16/2014   Post-menopausal atrophic vaginitis 06/24/2014   Nodule of left lung 01/21/2014   Class 1 obesity due to excess calories with serious comorbidity and body mass index (BMI) of 30.0 to 30.9 in adult 06/12/2012   Palpitations 11/29/2011   Cutaneous horn 09/29/2011   Abnormal mammogram of right breast 08/30/2011   Arthritis of left knee 05/24/2011   CAD (coronary artery disease) 05/24/2011   History of acute myocardial infarction of inferior wall 05/24/2011   Gastro-esophageal reflux disease with esophagitis 05/24/2011   Hyperlipidemia 05/24/2011    Past Surgical History:  Procedure Laterality Date  TOTAL THYROIDECTOMY  2016    OB History   No obstetric history on file.      Home Medications    Prior to Admission medications  Medication Sig Start Date End Date Taking? Authorizing Provider  Accu-Chek Softclix Lancets  lancets Use as instructed. Accu-chek Guide 10/23/21  Yes Jessup, Zada, NP  AMBULATORY NON FORMULARY MEDICATION Knee-high, medium compression, graduated compression stockings. Apply to lower extremities. [email or web address], Zippered Compression Stockings, medium circ, long length 12/15/16  Yes Cummings, Severa Norris, PA-C  ammonium lactate  (LAC-HYDRIN ) 12 % lotion APPLY TOPICALLY TO THE AFFECTED AREA AS NEEDED FOR DRY SKIN 01/11/24  Yes Rucker, Torrence GRADE, MD  ASPIRIN PO Take 81 mg by mouth daily.   Yes [provider]  blood glucose meter kit and supplies Dispense based on patient and insurance preference. Check daily. (FOR ICD-10 E10.9, E11.9). 10/23/21  Yes Willo Zada, NP  dapagliflozin  propanediol (FARXIGA ) 5 MG TABS tablet Take 1 tablet (5 mg total) by mouth daily. 04/02/24  Yes Rucker, Torrence GRADE, MD  diclofenac  Sodium (VOLTAREN  ARTHRITIS PAIN) 1 % GEL Apply 2 g topically 4 (four) times daily as needed. 03/30/24  Yes Rucker, Torrence GRADE, MD  esomeprazole  (NEXIUM ) 20 MG capsule TAKE 1 CAPSULE(20 MG TOTAL) BY MOUTH DAILY AT 12:00PM 04/26/24  Yes Booker Darice SAUNDERS, FNP  Fezolinetant  (VEOZAH ) 45 MG TABS Take 1 tablet (45 mg total) by mouth daily. 08/12/24  Yes Crain, Whitney L, PA  fluticasone  (FLONASE ) 50 MCG/ACT nasal spray Place into both nostrils as needed for allergies or rhinitis.   Yes [provider]  glucose blood (ACCU-CHEK GUIDE) test strip USE AS INSTRUCTED TO TEST ONCE DAILY 11/25/22  Yes Willo Zada, NP  isosorbide  mononitrate (IMDUR ) 60 MG 24 hr tablet Take 1 tablet (60 mg total) by mouth daily. 06/13/24  Yes Pietro Redell RAMAN, MD  levothyroxine  (SYNTHROID ) 88 MCG tablet Take 88 mcg by mouth daily before breakfast. 08/04/23  Yes [provider]  lisinopril -hydrochlorothiazide  (ZESTORETIC ) 20-12.5 MG tablet TAKE 1 TABLET BY MOUTH DAILY 06/29/23  Yes Pietro Redell RAMAN, MD  metFORMIN  (GLUCOPHAGE ) 1000 MG tablet TAKE 1 TABLET(1000 MG) BY MOUTH EVERY MORNING 06/27/24  Yes Booker Darice SAUNDERS, FNP  metoprolol  succinate (TOPROL -XL) 50 MG 24 hr tablet TAKE 1 TABLET(50 MG) BY MOUTH DAILY WITH OR IMMEDIATELY FOLLOWING A MEAL 04/27/24  Yes Pietro Redell RAMAN, MD  naproxen  (NAPROSYN ) 500 MG tablet Take 1 tablet (500 mg total) by mouth 2 (two) times daily with a meal. 08/25/23  Yes Cleotilde Redell, MD  nitroGLYCERIN  (NITROSTAT ) 0.4 MG SL tablet DISSOLVE 1 TABLET UNDER THE TONGUE EVERY 5 MINUTES, AS NEEDED FOR CHEST PAIN. MAX 3 TABLETS IN 15 MINUTES 09/07/21  Yes Pietro Redell RAMAN, MD  rosuvastatin  (CRESTOR ) 40 MG tablet TAKE 1 TABLET(40 MG) BY MOUTH AT BEDTIME 04/17/24  Yes Booker Darice SAUNDERS, FNP  tirzepatide  (MOUNJARO ) 7.5 MG/0.5ML Pen Inject 7.5 mg into the skin once a week. 08/10/24  Yes Crain, Whitney L, PA  triamcinolone  cream (KENALOG ) 0.1 % Apply topically 2 (two) times daily as needed. 09/05/23  Yes Rucker, Torrence GRADE, MD  valACYclovir  (VALTREX ) 1000 MG tablet Take 1 tablet (1,000 mg total) by mouth 3 (three) times daily. 06/01/23  Yes Teddy Sharper, FNP  VITAMIN D  PO Take by mouth.   Yes [provider]    Family History Family History  Problem Relation Age of Onset   Hypertension Mother    Hypertension Sister    Hypertension Brother    Breast cancer  Daughter    Hypertension Sister     Social History Social History[1]   Allergies   Bee venom, Tape, and Tuberculin tests   Review of Systems Review of Systems  Cardiovascular:  Positive for chest pain.  All other systems reviewed and are negative.    Physical Exam Triage Vital Signs ED Triage Vitals  Encounter Vitals Group     BP 08/15/24 1627 110/70     Girls Systolic BP Percentile --      Girls Diastolic BP Percentile --      Boys Systolic BP Percentile --      Boys Diastolic BP Percentile --      Pulse Rate 08/15/24 1627 77     Resp 08/15/24 1627 18     Temp 08/15/24 1627 98.2 F (36.8 C)     Temp Source 08/15/24 1627 Oral     SpO2 08/15/24 1627 98 %     Weight 08/15/24 1626 159 lb (72.1 kg)      Height 08/15/24 1626 5' 3 (1.6 m)     Head Circumference --      Peak Flow --      Pain Score 08/15/24 1626 7     Pain Loc --      Pain Education --      Exclude from Growth Chart --    No data found.  Updated Vital Signs BP 110/70 (BP Location: Right Arm)   Pulse 77   Temp 98.2 F (36.8 C) (Oral)   Resp 18   Ht 5' 3 (1.6 m)   Wt 159 lb (72.1 kg)   SpO2 98%   BMI 28.17 kg/m   Physical Exam Vitals and nursing note reviewed.  Constitutional:      General: She is not in acute distress.    Appearance: Normal appearance. She is well-developed and normal weight. She is not ill-appearing or diaphoretic.  HENT:     Head: Normocephalic and atraumatic.     Mouth/Throat:     Mouth: Mucous membranes are moist.     Pharynx: Oropharynx is clear.  Eyes:     Extraocular Movements: Extraocular movements intact.     Pupils: Pupils are equal, round, and reactive to light.  Cardiovascular:     Rate and Rhythm: Normal rate and regular rhythm.     Heart sounds: Normal heart sounds.  Pulmonary:     Effort: Pulmonary effort is normal. No tachypnea.     Breath sounds: Normal breath sounds. No wheezing, rhonchi or rales.  Musculoskeletal:        General: Normal range of motion.  Skin:    General: Skin is warm and dry.  Neurological:     General: No focal deficit present.     Mental Status: She is alert and oriented to person, place, and time. Mental status is at baseline.  Psychiatric:        Mood and Affect: Mood normal.        Behavior: Behavior normal.      UC Treatments / Results  Labs (all labs ordered are listed, but only abnormal results are displayed) Labs Reviewed - No data to display  EKG   Radiology DG Chest 2 View Result Date: 08/15/2024 EXAM: 2 VIEW(S) XRAY OF THE CHEST 08/15/2024 06:14:00 PM COMPARISON: Chest x ray 07/16/2024. CLINICAL HISTORY: chest pain FINDINGS: LUNGS AND PLEURA: No focal pulmonary opacity. No pleural effusion. No pneumothorax. HEART AND  MEDIASTINUM: Calcified aorta. No acute abnormality of the cardiac and mediastinal  silhouettes. BONES AND SOFT TISSUES: Thoracic degenerative changes. No acute osseous abnormality. IMPRESSION: 1. No acute findings. Electronically signed by: Greig Pique MD 08/15/2024 06:17 PM EST RP Workstation: [id number]    Procedures Procedures (including critical care time)  Medications Ordered in UC Medications - No data to display  Initial Impression / Assessment and Plan / UC Course  I have reviewed the triage vital signs and the nursing notes.  Pertinent labs & imaging results that were available during my care of the patient were reviewed by me and considered in my medical decision making (see chart for details).     MDM: 1.  Chest pain, unspecified-abnormal EKG sinus rhythm with marked sinus arrhythmia similar in appearance to previous EKG of 08/29/2023.  Advised patient to go to [facility] ED for further evaluation of chest pain.  Patient agreed and verbalized understanding of these instructions and this plan of care today.  Patient discharged to ED, hemodynamically stable Final Clinical Impressions(s) / UC Diagnoses   Final diagnoses:  Chest pain, unspecified type  Abnormal EKG     Discharge Instructions      Advised patient go to [facility] ED now for further evaluation of chest pain and abnormal EKG.     ED Prescriptions   None    PDMP not reviewed this encounter.     [1]  Social History Tobacco Use   Smoking status: Former    Passive exposure: Never   Smokeless tobacco: Never  Vaping Use   Vaping status: Never Used  Substance Use Topics   Alcohol use: No   Drug use: No     Teddy Sharper, FNP 08/15/24 1910  "

## 2024-08-15 NOTE — Discharge Instructions (Addendum)
 Advised patient go to [facility] ED now for further evaluation of chest pain and abnormal EKG.

## 2024-08-15 NOTE — ED Triage Notes (Signed)
 Patient c/o left sided chest pain that started hurting today x 3 hours ago.  Tender to touch.  Denies any SOB.  Arm pain but can't remember which arm.  Denies any OTC pain meds.

## 2024-08-15 NOTE — ED Triage Notes (Signed)
 Pt c/o L chest pain radiating to L arm since appx 1400 today.    +diaphoresis.

## 2024-08-15 NOTE — Discharge Instructions (Addendum)
 Please read and follow all provided instructions.  Your diagnoses today include:  1. Precordial pain     Tests performed today include: An EKG of your heart A chest x-ray Cardiac enzymes - a blood test for heart muscle damage, were both normal Blood counts and electrolytes - no problems noted Vital signs. See below for your results today.   Medications prescribed:  Lidocaine  patch: Topical numbing patch for the skin  Take any prescribed medications only as directed.  Follow-up instructions: Please follow-up with your primary care provider as soon as you can for further evaluation of your symptoms.   Return instructions:  SEEK IMMEDIATE MEDICAL ATTENTION IF: You have severe chest pain, especially if the pain is crushing or pressure-like and spreads to the arms, back, neck, or jaw, or if you have sweating, nausea or vomiting, or trouble with breathing. THIS IS AN EMERGENCY. Do not wait to see if the pain will go away. Get medical help at once. Call 911. DO NOT drive yourself to the hospital.  Your chest pain gets worse and does not go away after a few minutes of rest.  You have an attack of chest pain lasting longer than what you usually experience.  You have significant dizziness, if you pass out, or have trouble walking.  You have chest pain not typical of your usual pain for which you originally saw your caregiver.  You have any other emergent concerns regarding your health.  Additional Information: Chest pain comes from many different causes. Your caregiver has diagnosed you as having chest pain that is not specific for one problem, but does not require admission.  You are at low risk for an acute heart condition or other serious illness.   Your vital signs today were: BP 117/84 (BP Location: Left Arm)   Pulse 68   Temp 98 F (36.7 C) (Oral)   Resp 12   Ht 5' 3 (1.6 m)   Wt 72.1 kg   SpO2 97%   BMI 28.17 kg/m  If your blood pressure (BP) was elevated above 135/85 this  visit, please have this repeated by your doctor within one month. --------------

## 2024-08-15 NOTE — ED Notes (Signed)
 Patient is being discharged from the Urgent Care and sent to the Emergency Department via POV . Per Ozell Ragan,NP patient is in need of higher level of care due to further evaluation. Patient is aware and verbalizes understanding of plan of care.  Vitals:   08/15/24 1627  BP: 110/70  Pulse: 77  Resp: 18  Temp: 98.2 F (36.8 C)  SpO2: 98%

## 2024-08-15 NOTE — Telephone Encounter (Signed)
 Pharmacy Patient Advocate Encounter  Received notification from [facility] that Prior Authorization for Veozah  45MG  tablets has been APPROVED from 08/15/2024 to 02/12/2025. Ran test claim, Copay is $4.90. This test claim was processed through [facility]- copay amounts may vary at other pharmacies due to pharmacy/plan contracts, or as the patient moves through the different stages of their insurance plan.   PA #/Case ID/Reference #: [id number]

## 2024-08-15 NOTE — Telephone Encounter (Signed)
 Pharmacy Patient Advocate Encounter   Received notification from [facility] that prior authorization for Veozah  45MG  tablets is required/requested.   Insurance verification completed.   The patient is insured through [place].   Per test claim: PA required; PA submitted to above mentioned insurance via Latent Key/confirmation #/EOC [id number] Status is pending

## 2024-08-15 NOTE — ED Notes (Signed)
Patient transported to imaging at this time.

## 2024-08-22 ENCOUNTER — Ambulatory Visit (INDEPENDENT_AMBULATORY_CARE_PROVIDER_SITE_OTHER)

## 2024-08-22 VITALS — BP 108/62 | Ht 63.0 in | Wt 159.0 lb

## 2024-08-22 DIAGNOSIS — G8929 Other chronic pain: Secondary | ICD-10-CM

## 2024-08-22 DIAGNOSIS — M1711 Unilateral primary osteoarthritis, right knee: Secondary | ICD-10-CM

## 2024-08-22 DIAGNOSIS — M25562 Pain in left knee: Secondary | ICD-10-CM

## 2024-08-22 DIAGNOSIS — M25561 Pain in right knee: Secondary | ICD-10-CM

## 2024-08-22 DIAGNOSIS — M1712 Unilateral primary osteoarthritis, left knee: Secondary | ICD-10-CM | POA: Diagnosis not present

## 2024-08-22 NOTE — Patient Instructions (Signed)
 Knee Osteoarthritis Treatment Options  Taping, bracing, icing, etc. (Reasonable place to start, modest quality of evidence supporting these)  Physical therapy/home exercises (strong body of evidence supporting this. It's the cornerstone of any arthritis treatment regimen I recommend, motion is lotion!)   Turmeric (1000 mg) + black pepper (10mg ) once daily (reasonable anti-inflammatory effect if you want to avoid medicines)  Collagen peptides 5-10g taken daily for 8-12 week treatment periods (some studies actually show increasing cartilage thickness with this!)   Topical anti-inflammatories (Voltaren  Gel... Very safe. Almost no systemic absorption. Best applied 4x daily but can start with 2x daily)   Oral anti-inflammatories (ibuprofen, naproxen , etc.... good for flares but not a safe long-term option)   Joint injections --- Steroids (also good for flares, can be done as frequently as every 3 months, but can accelerate the thinning of cartilage if done repeatedly, covered by insurance)  --- Anti-inflammatories (good for flares, can be done as frequently as every 3 months, less harmful to cartilage than steroids, covered by insurance) --- Gel injections (probably a better maintenance therapy but still can be helpful for flares, can be done as frequently as every 6 months, less harmful cartilage and than steroids, covered by nearly all insurance) --- Platelet rich plasma (has the strongest body of evidence among injection options, typically done as single injection but can be repeated, not harmful to cartilage, $500 out of pocket, not covered by insurance) --- Dextrose Prolotherapy (actually a sugar water solution, similar treatment mechanism to platelet rich plasma but not as robust, not harmful to cartilage, covered by insurance)   Genicular nerve block (the 3 small nerves that sense pain from the knee joint can be numbed under ultrasound guidance, can be repeated every 3 months, not harmful to  cartilage, covered by insurance)   Total knee replacement surgery   None of these options (except surgery, or course) preclude the others so your treatment plan can be totally customized in any order you'd like.   Feel free to research these and let me know which you'd like to pursue next.  -Dr. Charles

## 2024-08-22 NOTE — Progress Notes (Signed)
" ° °  Subjective:    Patient ID: Sara Gardner, female    DOB: 74 y.o., 09-16-1950   MRN: [id number]  Chief Complaint: Chronic bilateral knee pain  History of Present Illness Stephan is a 17 year old with past medical history significant for CAD, hypertension, hypothyroidism, T2DM (last A1c 6.2 on 08/10/2024), CKD 3B presenting for evaluation of chronic bilateral knee pain.  She reports being seen by Leonce Reveal with [facility] back in October where she received intra-articular corticosteroid injections which worsened her pain in both of her knees.  Since then they are achy, worse with ascending and descending stairs, limit her activities daily.  No locking/catching.  Review of pertinent imaging: 4 view plain film radiographs obtained of the bilateral knees per my independent evaluation on 04/06/2024 revealing complete loss of joint space in the lateral compartment. Minimal remaining joint space in the medial compartment.  Prominent osteophytic change noted along the patellar and lateral compartments.    Objective:   Vitals:   08/22/24 1002  BP: 108/62   Left Knee (compared to normal) -Inspection: Mild swelling.  No deformity. -Palpation: TTP + quad tendon, - patella, - patellar tendon, - tibial tuberosity, + pes bursa, - gerdy tubercle, - medial joint line, + lateral joint line, - posterior knee, - medial and lateral hamstrings.  Moderate crepitus with flexion/extension. -AROM/PROM: 0 degrees extension, 130 degrees flexion, low hamstring flexibility -Strength: unable to tolerate single leg squat, 5/5 flexion, 5/5 extension -Special tests:    -ACL: - lachman, - lever test   -MCL: 1-2+ and painless with valgus at 0/30 degrees   -LCL: 1+ and painless with varus at 0/30 degrees   -PCL: - sag sign   -Meniscus: Equivocal McMurray   -Patellofemoral: + patellar grind  RIght Knee (compared to normal) -Inspection: Mild to moderate swelling.  No  deformity -Palpation: TTP + quad tendon, - patella, - patellar tendon, - tibial tuberosity, + pes bursa, - gerdy tubercle, equivocal ttp at medial joint line, + lateral joint line, - posterior knee, - medial and lateral hamstrings.  Mild crepitus with flexion/extension. -AROM/PROM: 7 degrees extension, 100 degrees flexion, low hamstring flexibility -Strength: unable to tolerate single leg squat, 5/5 flexion, 5/5 extension -Special tests:    -ACL: - lachman, - lever test   -MCL: 1-2+ and painless with valgus at 0/30 degrees   -LCL: 1+ and painless with varus at 0/30 degrees   -PCL: - sag sign   -Meniscus: Equivocal McMurray   -Patellofemoral: + patellar grind      Assessment & Plan:   Assessment & Plan Bruna has chronic bilateral knee pain most consistent with bilateral knee osteoarthritis based on both exam and x-rays.  After discussion of the myriad treatment options available to her, she expresses interest in pursuing the hyaluronic acid injection in both of her knees.  We will start the insurance approval process for this and notify her to schedule an appointment to receive those injections once we hear back from her insurance.  Also discussed with her that she may be a good candidate for knee replacement based on her level of debility and her x-ray findings in the greater context of her age.  We may discuss this at some point again in the future.   "

## 2024-09-03 ENCOUNTER — Ambulatory Visit

## 2024-11-23 ENCOUNTER — Encounter (INDEPENDENT_AMBULATORY_CARE_PROVIDER_SITE_OTHER): Admitting: Ophthalmology

## 2024-12-10 ENCOUNTER — Ambulatory Visit: Admitting: Urgent Care
